# Patient Record
Sex: Male | Born: 1935 | Race: White | Hispanic: No | Smoking: Former smoker
Health system: Southern US, Community
[De-identification: ages and names within clinical notes are randomized; demographics above are authoritative.]

## PROBLEM LIST (undated history)

## (undated) DIAGNOSIS — F32A Depression, unspecified: Secondary | ICD-10-CM

## (undated) DIAGNOSIS — I2510 Atherosclerotic heart disease of native coronary artery without angina pectoris: Secondary | ICD-10-CM

## (undated) DIAGNOSIS — E782 Mixed hyperlipidemia: Secondary | ICD-10-CM

## (undated) DIAGNOSIS — R600 Localized edema: Secondary | ICD-10-CM

## (undated) DIAGNOSIS — E785 Hyperlipidemia, unspecified: Secondary | ICD-10-CM

## (undated) DIAGNOSIS — I340 Nonrheumatic mitral (valve) insufficiency: Secondary | ICD-10-CM

## (undated) DIAGNOSIS — K635 Polyp of colon: Secondary | ICD-10-CM

## (undated) DIAGNOSIS — J189 Pneumonia, unspecified organism: Secondary | ICD-10-CM

## (undated) DIAGNOSIS — C61 Malignant neoplasm of prostate: Secondary | ICD-10-CM

## (undated) DIAGNOSIS — R609 Edema, unspecified: Secondary | ICD-10-CM

## (undated) DIAGNOSIS — I739 Peripheral vascular disease, unspecified: Secondary | ICD-10-CM

## (undated) DIAGNOSIS — R0602 Shortness of breath: Secondary | ICD-10-CM

## (undated) DIAGNOSIS — H409 Unspecified glaucoma: Secondary | ICD-10-CM

## (undated) DIAGNOSIS — F329 Major depressive disorder, single episode, unspecified: Secondary | ICD-10-CM

## (undated) DIAGNOSIS — E041 Nontoxic single thyroid nodule: Secondary | ICD-10-CM

## (undated) DIAGNOSIS — K589 Irritable bowel syndrome without diarrhea: Secondary | ICD-10-CM

## (undated) DIAGNOSIS — I779 Disorder of arteries and arterioles, unspecified: Secondary | ICD-10-CM

## (undated) DIAGNOSIS — R42 Dizziness and giddiness: Secondary | ICD-10-CM

## (undated) HISTORY — DX: Localized edema: R60.0

## (undated) HISTORY — DX: Polyp of colon: K63.5

## (undated) HISTORY — DX: Nonrheumatic mitral (valve) insufficiency: I34.0

## (undated) HISTORY — DX: Malignant neoplasm of prostate: C61

## (undated) HISTORY — PX: TRANSURETHRAL RESECTION OF PROSTATE: SHX73

## (undated) HISTORY — DX: Shortness of breath: R06.02

## (undated) HISTORY — DX: Unspecified glaucoma: H40.9

## (undated) HISTORY — DX: Major depressive disorder, single episode, unspecified: F32.9

## (undated) HISTORY — DX: Edema, unspecified: R60.9

## (undated) HISTORY — DX: Mixed hyperlipidemia: E78.2

## (undated) HISTORY — DX: Dizziness and giddiness: R42

## (undated) HISTORY — DX: Peripheral vascular disease, unspecified: I73.9

## (undated) HISTORY — DX: Nontoxic single thyroid nodule: E04.1

## (undated) HISTORY — DX: Atherosclerotic heart disease of native coronary artery without angina pectoris: I25.10

## (undated) HISTORY — DX: Pneumonia, unspecified organism: J18.9

## (undated) HISTORY — DX: Disorder of arteries and arterioles, unspecified: I77.9

## (undated) HISTORY — PX: TONSILLECTOMY: SUR1361

## (undated) HISTORY — DX: Irritable bowel syndrome, unspecified: K58.9

## (undated) HISTORY — PX: CHOLECYSTECTOMY: SHX55

## (undated) HISTORY — DX: Depression, unspecified: F32.A

## (undated) HISTORY — PX: INCONTINENCE SURGERY: SHX676

## (undated) HISTORY — DX: Hyperlipidemia, unspecified: E78.5

---

## 1998-09-03 ENCOUNTER — Ambulatory Visit (HOSPITAL_COMMUNITY): Admission: RE | Admit: 1998-09-03 | Discharge: 1998-09-03 | Payer: Self-pay | Admitting: Radiation Oncology

## 1998-09-03 ENCOUNTER — Encounter: Admission: RE | Admit: 1998-09-03 | Discharge: 1998-12-02 | Payer: Self-pay | Admitting: Radiation Oncology

## 2005-12-29 ENCOUNTER — Encounter: Admission: RE | Admit: 2005-12-29 | Discharge: 2005-12-29 | Payer: Self-pay | Admitting: Neurosurgery

## 2006-01-12 ENCOUNTER — Encounter: Admission: RE | Admit: 2006-01-12 | Discharge: 2006-01-12 | Payer: Self-pay | Admitting: Neurosurgery

## 2006-06-01 ENCOUNTER — Encounter: Payer: Self-pay | Admitting: Physician Assistant

## 2006-06-01 ENCOUNTER — Ambulatory Visit: Payer: Self-pay | Admitting: Cardiology

## 2006-11-03 HISTORY — PX: CARDIAC CATHETERIZATION: SHX172

## 2007-09-27 ENCOUNTER — Encounter: Payer: Self-pay | Admitting: Physician Assistant

## 2007-09-27 ENCOUNTER — Ambulatory Visit: Payer: Self-pay | Admitting: Cardiology

## 2007-09-29 ENCOUNTER — Inpatient Hospital Stay (HOSPITAL_BASED_OUTPATIENT_CLINIC_OR_DEPARTMENT_OTHER): Admission: RE | Admit: 2007-09-29 | Discharge: 2007-09-29 | Payer: Self-pay | Admitting: Internal Medicine

## 2007-09-29 ENCOUNTER — Ambulatory Visit: Payer: Self-pay | Admitting: Internal Medicine

## 2007-10-25 ENCOUNTER — Encounter: Payer: Self-pay | Admitting: Physician Assistant

## 2007-10-25 ENCOUNTER — Ambulatory Visit: Payer: Self-pay | Admitting: Cardiology

## 2007-11-03 ENCOUNTER — Ambulatory Visit: Payer: Self-pay | Admitting: Cardiology

## 2007-11-03 ENCOUNTER — Encounter: Payer: Self-pay | Admitting: Physician Assistant

## 2007-12-09 ENCOUNTER — Ambulatory Visit: Payer: Self-pay | Admitting: Cardiology

## 2007-12-27 ENCOUNTER — Encounter: Payer: Self-pay | Admitting: Physician Assistant

## 2010-02-05 ENCOUNTER — Encounter (INDEPENDENT_AMBULATORY_CARE_PROVIDER_SITE_OTHER): Payer: Self-pay | Admitting: *Deleted

## 2010-02-07 ENCOUNTER — Ambulatory Visit (HOSPITAL_COMMUNITY): Admission: RE | Admit: 2010-02-07 | Discharge: 2010-02-08 | Payer: Self-pay | Admitting: Urology

## 2010-07-03 ENCOUNTER — Ambulatory Visit: Payer: Self-pay | Admitting: Physician Assistant

## 2010-07-03 DIAGNOSIS — R609 Edema, unspecified: Secondary | ICD-10-CM

## 2010-07-03 DIAGNOSIS — R072 Precordial pain: Secondary | ICD-10-CM | POA: Insufficient documentation

## 2010-07-03 DIAGNOSIS — I10 Essential (primary) hypertension: Secondary | ICD-10-CM

## 2010-07-03 DIAGNOSIS — R0602 Shortness of breath: Secondary | ICD-10-CM | POA: Insufficient documentation

## 2010-07-03 DIAGNOSIS — E785 Hyperlipidemia, unspecified: Secondary | ICD-10-CM

## 2010-07-04 ENCOUNTER — Encounter: Payer: Self-pay | Admitting: Physician Assistant

## 2010-07-05 ENCOUNTER — Ambulatory Visit: Payer: Self-pay | Admitting: Cardiology

## 2010-07-05 ENCOUNTER — Encounter: Payer: Self-pay | Admitting: Physician Assistant

## 2010-07-11 ENCOUNTER — Telehealth (INDEPENDENT_AMBULATORY_CARE_PROVIDER_SITE_OTHER): Payer: Self-pay | Admitting: *Deleted

## 2010-07-12 ENCOUNTER — Encounter (INDEPENDENT_AMBULATORY_CARE_PROVIDER_SITE_OTHER): Payer: Self-pay | Admitting: *Deleted

## 2010-07-31 ENCOUNTER — Encounter (INDEPENDENT_AMBULATORY_CARE_PROVIDER_SITE_OTHER): Payer: Self-pay | Admitting: *Deleted

## 2010-07-31 ENCOUNTER — Ambulatory Visit: Payer: Self-pay | Admitting: Physician Assistant

## 2010-08-01 ENCOUNTER — Encounter: Payer: Self-pay | Admitting: Physician Assistant

## 2010-08-07 ENCOUNTER — Encounter (INDEPENDENT_AMBULATORY_CARE_PROVIDER_SITE_OTHER): Payer: Self-pay | Admitting: *Deleted

## 2010-08-08 ENCOUNTER — Ambulatory Visit: Payer: Self-pay | Admitting: Cardiovascular Disease

## 2010-08-08 ENCOUNTER — Inpatient Hospital Stay (HOSPITAL_BASED_OUTPATIENT_CLINIC_OR_DEPARTMENT_OTHER): Admission: RE | Admit: 2010-08-08 | Discharge: 2010-08-08 | Payer: Self-pay | Admitting: Cardiovascular Disease

## 2010-08-22 ENCOUNTER — Ambulatory Visit: Payer: Self-pay | Admitting: Physician Assistant

## 2010-10-14 ENCOUNTER — Encounter: Payer: Self-pay | Admitting: Physician Assistant

## 2010-12-03 NOTE — Progress Notes (Signed)
Summary: PHONE: TEST RESULTS  Phone Note Call from Patient Call back at Home Phone [phone]   Reason for Call: Lab or Test Results Summary of Call: wants test results of his echo.  Initial call taken by: Zachary George,  July 11, 2010 11:23 AM  Follow-up for Phone Call        Attempted to reach pt. No answer, no machine.  Cyril Loosen, RN, BSN  July 11, 2010 4:59 PM  Pt notified of results and verbalized understanding.  Follow-up by: Cyril Loosen, RN, BSN,  July 12, 2010 11:37 AM

## 2010-12-03 NOTE — Assessment & Plan Note (Signed)
Summary: eph-post cath pr marc degent needs to   Visit Type:  Follow-up Primary Provider:  Dr. Erasmo Downer   History of Present Illness: patient presents for postcatheterization followup.  We recently referred him for diagnostic coronary angiography to rule out CAD progression, since his previous study in 2008, revealing moderate disease. Patient recently presented with persistent DOE and occasional chest discomfort. A 2-D echo indicated preserved LVEF, but with multiple wall motion abnormalities, and mild MR.  Coronary angiography indicated moderate, nonobstructive CAD, with no significant change since his previous study in 2008. Recommendation was to consider a stress test, if patient has recurrent pain, to assess possible ischemia in the distribution of 2 moderate sized obtuse marginal branches, with 50-60% stenosis at the bifurcation. LV function was normal (50-55%).  Clinically, patient denies any chest pain. With respect to his dyspnea, this now appears to be essentially only when he stands up, after bending over. He is now denying any significant dyspnea with walking. Of note, he also has chronic peripheral edema, which is unchanged.  I also recently started him on low-dose aspirin, to which he has reportedly been previously allergic. He informs me today that he stopped this after about a month, secondary to visual scotomata, as well as difficulty sleeping. His symptoms resolved after he stopped aspirin.  Preventive Screening-Counseling & Management  Alcohol-Tobacco     Smoking Status: quit     Year Quit: 1985  Current Medications (verified): 1)  Pravastatin Sodium 40 Mg Tabs (Pravastatin Sodium) .... Take 1 Tablet By Mouth Once A Day 2)  Citalopram Hydrobromide 20 Mg Tabs (Citalopram Hydrobromide) .... Take 1 Tablet By Mouth Once A Day 3)  Lorazepam 1 Mg Tabs (Lorazepam) .... Take 1 Tablet By Mouth Two Times A Day 4)  Vita-Plus E 400 Unit Caps (Vitamin E) .... Take 1 Tablet By  Mouth Once A Day 5)  Nitrostat 0.4 Mg Subl (Nitroglycerin) .Marland Kitchen.. 1 Tablet Under Tongue At Onset of Chest Pain; You May Repeat Every 5 Minutes For Up To 3 Doses. 6)  Fish Oil 1000 Mg Caps (Omega-3 Fatty Acids) .... Take 1 Capsule By Mouth Two Times A Day 7)  Clotrimazole 1 % Crea (Clotrimazole) .... Use As Directed  Allergies (verified): 1)  ! Asa  Comments:  Nurse/Medical Assistant: The patient's medication list and allergies were reviewed with the patient and were updated in the Medication and Allergy Lists.  Past History:  Past Medical History:  Hyperlipidemia.    Depression.  Prostate cancer status post radiation treatment.   Status post TURP.   Shortness of Breath Dizzy CAD... moderate by catheterization, 11/08...essentially unchanged by repeat study, 10/11 Peripheral edema Mitral regurgitation, mild Mixed dyslipidemia  Review of Systems       No fevers, chills, hemoptysis, dysphagia, melena, hematocheezia, hematuria, rash, claudication, orthopnea, pnd, pedal edema. All other systems negative.   Vital Signs:  Patient profile:   77 year old male Height:      66 inches Weight:      178 pounds Pulse rate:   65 / minute BP sitting:   114 / 73  (left arm) Cuff size:   regular  Vitals Entered By: Carlye Grippe (August 22, 2010 1:50 PM)  Physical Exam  Additional Exam:  GEN: 77 year old male, sitting upright, in no distress HEENT: NCAT,PERRLA,EOMI NECK: palpable pulses, no bruits; no JVD; no TM LUNGS: CTA bilaterally HEART: RRR (S1S2); no significant murmurs; no rubs; no gallops ABD: soft, NT; intact BS EXT: intact distal pulses; 2-3+ bilateral,  lower extremity edema SKIN: warm, dry MUSC: no obvious deformity NEURO: A/O (x3)     Impression & Recommendations:  Problem # 1:  SHORTNESS OF BREATH (ICD-786.05)  recent coronary angiography indicates no ischemic explanation for his shortness of breath. He has unchanged anatomy, since previous study of 2008. LV  function is preserved, both by current catheterization and recent 2-D echo. He does have mild MR. He may have some component of diastolic dysfunction, and we'll need to continue monitoring this closely. He does have chronic, bilateral peripheral edema. With respect to antiplatelet therapy, patient has agreed to try Plavix 75 mg daily, given that he is not able to tolerate aspirin. Will closely monitor for any adverse effects  Problem # 2:  DYSLIPIDEMIA (ICD-272.4)  recently started on omega 3 fish oil, in conjunction with pravastatin, for treatment of hypertriglyceridemia. We'll reassess lipid status in 2 months  Problem # 3:  HYPERTENSION, BENIGN (ICD-401.1)  well-controlled on current regimen  Problem # 4:  PERIPHERAL EDEMA (ICD-782.3)  presumed secondary to CVI. Recent 2-D echo indicated normal RV function.  Other Orders: Future Orders: T-Lipid Profile ([id number]) ... 10/14/2010 T-Hepatic Function [id number]) ... 10/14/2010  Patient Instructions: 1)  Your physician wants you to follow-up in: 4 months. You will receive a reminder letter in the mail one-two months in advance. If you don't receive a letter, please call our office to schedule the follow-up appointment. 2)  Your physician recommends that you go to the [facility] for a FASTING lipid profile and liver function labs:  DO IN 2 MONTHS. AROUND October 21, 2010. 3)  Plavix 75mg  by mouth once daily. Prescriptions: PLAVIX 75 MG TABS (CLOPIDOGREL BISULFATE) Take one tablet by mouth daily  #30 x 6   Entered by:   Cyril Loosen, RN, BSN   Authorized by:   Nelida Meuse, PA-C   Signed by:   Cyril Loosen, RN, BSN on 08/22/2010   Method used:   Electronically to        [facility] [address] (retail)       [address]       [place], [place]  [address]       Ph: [phone] or [phone]       Fax: [phone]   RxID:   [id number]   Handout requested.  I have reviewed and  approved all prescriptions at the time of the office visit. Nelida Meuse, PA-C  August 22, 2010 2:48 PM

## 2010-12-03 NOTE — Assessment & Plan Note (Signed)
Summary: 1 MO F/U PER 8/31 OV W/GS-JM   Visit Type:  Follow-up Primary Kelsey Durflinger:  Dr. Erasmo Downer   History of Present Illness: patient returns for a scheduled followup, for ongoing evaluation of DOE and occasional chest discomfort. he presents with history of moderate, nonobstructive CAD by previous catheterization in 2008.  I referred him for chest x-ray, liver profile, and 2-D echo. X-ray was negative for CHF. liver profile notable for total cholesterol 190, triglycerides 660, and HDL 38. I started him on Omega 3 fish oil b.i.d. EGD echo indicated preserved LVF (EF 50-55% Preventive Screening-Counseling & Management  Alcohol-Tobacco     Smoking Status: quit     Year Quit: 1985  Current Medications (verified): 1)  Pravastatin Sodium 40 Mg Tabs (Pravastatin Sodium) .... Take 1 Tablet By Mouth Once A Day 2)  Citalopram Hydrobromide 20 Mg Tabs (Citalopram Hydrobromide) .... Take 1 Tablet By Mouth Once A Day 3)  Lorazepam 1 Mg Tabs (Lorazepam) .... Take 1 Tablet By Mouth Two Times A Day 4)  Vita-Plus E 400 Unit Caps (Vitamin E) .... Take 1 Tablet By Mouth Once A Day 5)  Nitrostat 0.4 Mg Subl (Nitroglycerin) .Marland Kitchen.. 1 Tablet Under Tongue At Onset of Chest Pain; You May Repeat Every 5 Minutes For Up To 3 Doses. 6)  Fish Oil 1000 Mg Caps (Omega-3 Fatty Acids) .... Take 1 Capsule By Mouth Two Times A Day  Allergies (verified): 1)  ! Asa  Comments:  Nurse/Medical Assistant: The patient is currently on medications but does not know the name or dosage at this time. Instructed to contact our office with details. Will update medication list at that time.  Past History:  Past Medical History:  Hyperlipidemia.    Depression.  Prostate cancer status post radiation treatment.   Status post TURP.   Shortness of Breath Dizzy CAD... moderate by catheterization, 11/08 Peripheral edema Mitral regurgitation Mixed dyslipidemia  Review of Systems       No fevers, chills, hemoptysis,  dysphagia, melena, hematocheezia, hematuria, rash, claudication, orthopnea, or pnd, pedal. All other systems negative.   Vital Signs:  Patient profile:   77 year old male Height:      66 inches Weight:      179 pounds Pulse rate:   73 / minute BP sitting:   116 / 78  (left arm) Cuff size:   regular  Vitals Entered By: Carlye Grippe (July 31, 2010 2:00 PM)  Physical Exam  Additional Exam:  GEN: 77 year old male, sitting upright, in no distress HEENT: NCAT,PERRLA,EOMI NECK: palpable pulses, no bruits; no JVD; no TM LUNGS: CTA bilaterally HEART: RRR (S1S2); no significant murmurs; no rubs; no gallops ABD: soft, NT; intact BS EXT: intact distal pulses; 2-3+ bilateral, lower extremity edema SKIN: warm, dry MUSC: no obvious deformity NEURO: A/O (x3)     Impression & Recommendations:  Problem # 1:  SHORTNESS OF BREATH (ICD-786.05)  I am concerned that this represents an anginal equivalent. Recent 2-D echo indicated normal LVF, but with multiple wall motion abnormalities. In discussion with the patient and his son, we've agreed to proceed with a relook catheterization to rule out significant CAD progression, rather than proceeding with a stress test. The patient is in agreement with this recommendation, and the risk/benefits were discussed, in conjunction with Dr. Andee Lineman, who approved of this plan.  Orders: T-Basic Metabolic Panel [id number]) T-CBC No Diff ([id number]) T-Protime, Auto ([id number]) T-PTT ([id number]) Cardiac Catheterization (Cardiac Cath)  His updated medication list for this problem includes:  Aspirin 81 Mg Tbec (Aspirin) .Marland Kitchen... Take one tablet by mouth daily  Problem # 2:  DYSLIPIDEMIA (ICD-272.4)  recent profile indicates mixed dyslipidemia, with markedly elevated triglycerides. Patient has begun omega-3 fish oil treatment, in conjunction with the statin that he was already taking, spaceand we'll reassess this in the next 2 months.  His updated  medication list for this problem includes:    Pravastatin Sodium 40 Mg Tabs (Pravastatin sodium) .Marland Kitchen... Take 1 tablet by mouth once a day  Problem # 3:  HYPERTENSION, BENIGN (ICD-401.1) Assessment: Comment Only  His updated medication list for this problem includes:    Aspirin 81 Mg Tbec (Aspirin) .Marland Kitchen... Take one tablet by mouth daily  Problem # 4:  PERIPHERAL EDEMA (ICD-782.3)  most likely related to chronic venous insufficiency. Recent echocardiogram indicated normal RV function.  Patient Instructions: 1)  Your physician recommends that you go to the [facility] for lab work: DO ON 08/01/10. 2)  Your physician discussed the risks, benefits and indications for preventive aspirin therapy. It is recommended that you start (or continue) taking 81 mg of aspirin a day. 3)  Your physician has requested that you have a cardiac catheterization.  Cardiac catheterization is used to diagnose and/or treat various heart conditions. Doctors may recommend this procedure for a number of different reasons. The most common reason is to evaluate chest pain. Chest pain can be a symptom of coronary artery disease (CAD), and cardiac catheterization can show whether plaque is narrowing or blocking your heart's arteries. This procedure is also used to evaluate the valves, as well as measure the blood flow and oxygen levels in different parts of your heart.  For further information please visit [email or web address]  Please follow instruction sheet, as given.

## 2010-12-03 NOTE — Letter (Signed)
Summary: Engineer, materials at [facility]  [address]   [place], [place] [address]   Phone: [phone]  Fax: [phone]        July 12, 2010 MRN: [id number]   Warren [name] [address] [place], [place]  [address]   Dear Mr. [name],  Your test ordered by Selena Batten has been reviewed by your physician (or physician assistant) and was found to be normal or stable. Your physician (or physician assistant) felt no changes were needed at this time.  ____ Echocardiogram  ____ Cardiac Stress Test  ____ Lab Work  ____ Peripheral vascular study of arms, legs or neck  __X__ CT scan or X-ray - chest x-ray   ____ Lung or Breathing test  ____ Other:   Thank you.   Hoover Brunette, LPN    Duane Boston, M.D., F.A.C.C. Thressa Sheller, M.D., F.A.C.C. Oneal Grout, M.D., F.A.C.C. Cheree Ditto, M.D., F.A.C.C. Daiva Nakayama, M.D., F.A.C.C. Kenney Houseman, M.D., F.A.C.C. Jeanne Ivan, PA-C

## 2010-12-03 NOTE — Cardiovascular Report (Signed)
Summary: Cardiac Cath  Cardiac Cath   Imported By: Zachary George 08/22/2010 11:48:04  _____________________________________________________________________  External Attachment:    Type:   Image     Comment:   External Document

## 2010-12-03 NOTE — Cardiovascular Report (Signed)
Summary: CARDIAC CATH  [place]  CARDIAC CATH  [place]   Imported By: Zachary George 06/20/2010 14:14:28  _____________________________________________________________________  External Attachment:    Type:   Image     Comment:   External Document

## 2010-12-03 NOTE — Letter (Signed)
Summary: Internal Correspondence/ FAXED PRE-CATH ORDER  Internal Correspondence/ FAXED PRE-CATH ORDER   Imported By: Dorise Hiss 08/08/2010 14:00:04  _____________________________________________________________________  External Attachment:    Type:   Image     Comment:   External Document

## 2010-12-03 NOTE — Letter (Signed)
Summary: Cardiac Cath Instructions - [facility]  [facility] [address]   [place], [place] [address]   Phone: [phone]  Fax: [phone]     07/31/2010 MRN: [id number]  Warren [name] [address] [place], [place]  [address]  Dear [name], ROSIER are scheduled for a Cardiac Catheterization on Thursday, August 08, 2010 with Dr. [place].  Please arrive to the 1st floor of the Heart and Vascular Center at [facility] at 0930 am on the day of your procedure. Please do not arrive before 6:30 a.m. Call the Heart and Vascular Center at [phone] if you are unable to make your appointmnet. The Code to get into the parking garage under the building is 0200. Take the elevators to the 1st floor. You must have someone to drive you home. Someone must be with you for the first 24 hours after you arrive home. Please wear clothes that are easy to get on and off and wear slip-on shoes. Do not eat or drink after midnight except water with your medications that morning. Bring all your medications and current insurance cards with you.  ___ DO NOT take these medications before your procedure:  _X__ Make sure you take your aspirin.  _X__ You may take ALL of your medications with water that morning.  ___ DO NOT take ANY medications before your procedure.  ___ Pre-med instructions:  ________________________________________________________________________  The usual length of stay after your procedure is 2 to 3 hours. This can vary.  If you have any questions, please call the office at the number listed above.  Cyril Loosen, RN, BSN              Directions to the [facility]  Please Note : Park in [place] under the building not the parking deck.  From [employer]: Turn onto [address] Left onto [place] (1st stoplight) Right at the brick entrance to the hospital ([address]) Bear to the  right and you will see a blue sign "[facility]" Parking garage is a sharp right'to get through the gate out in the code __0200_____. Once you park, take the elevator to the first floor. Please do not arrive before 0630am. The building will be dark before that time.   From [address] Turn onto [address] Turn left into the brick entrance to the hospital (Main circle drive) Bear to the right and you will see a blue sign "[facility]" Parking garage is a sharp right, to get thru the gate put in the code _0200___. Once you park, take the elevator to the first floor. Please do not arrive before 0630am. The building will be dark before that time

## 2010-12-03 NOTE — Letter (Signed)
Summary: Engineer, materials at [facility]  [address]   [place], [place] [address]   Phone: [phone]  Fax: [phone]        August 07, 2010 MRN: [id number]   Warren [name] [address] [place], [place]  [address]   Dear Warren [name],  Your test ordered by Selena Batten has been reviewed by your physician (or physician assistant) and was found to be normal or stable. Your physician (or physician assistant) felt no changes were needed at this time.  ____ Echocardiogram  ____ Cardiac Stress Test  __X__ Lab Work - precath labs  ____ Peripheral vascular study of arms, legs or neck  ____ CT scan or X-ray  ____ Lung or Breathing test  ____ Other:   Thank you.   Hoover Brunette, LPN    Duane Boston, M.D., F.A.C.C. Thressa Sheller, M.D., F.A.C.C. Oneal Grout, M.D., F.A.C.C. Cheree Ditto, M.D., F.A.C.C. Daiva Nakayama, M.D., F.A.C.C. Kenney Houseman, M.D., F.A.C.C. Jeanne Ivan, PA-C

## 2010-12-03 NOTE — Assessment & Plan Note (Signed)
Summary: 2 yr fu-seen by Dr. Andee Lineman 2008   Visit Type:  Follow-up Primary Provider:  Dr. Erasmo Downer   History of Present Illness: patient presents for evaluation of exertional dyspnea. he was last seen here in the clinic in February 2009, for management of moderate, nonobstructive CAD by catheterization, 11/08, normal left ventricular function, and dyslipidemia. He has not had any subsequent ischemic evaluation.  He presents with his son, who suggests that the patient has worsening DOE and lower extremity edema. However, there is no indication of orthopnea or PND. The patient, himself, also suggests some occasional chest discomfort, but not always, and not while in the midst of moderate exertion.  EKG in our office today indicates NSR with no ischemic changes.  Preventive Screening-Counseling & Management  Alcohol-Tobacco     Smoking Status: quit  Comments: quit smoking about 25 yrs ago after smoking for about 25 yrs  Current Medications (verified): 1)  Pravastatin Sodium 40 Mg Tabs (Pravastatin Sodium) .... Take 1 Tablet By Mouth Once A Day 2)  Citalopram Hydrobromide 20 Mg Tabs (Citalopram Hydrobromide) .... Take 1 Tablet By Mouth Once A Day 3)  Lorazepam 1 Mg Tabs (Lorazepam) .... Take 1 Tablet By Mouth Two Times A Day 4)  Vita-Plus E 400 Unit Caps (Vitamin E) .... Take 1 Tablet By Mouth Once A Day  Allergies: 1)  ! Asa  Comments:  Nurse/Medical Assistant: The patient's medications were reviewed with the patient and were updated in the Medication List. Pt brought a list of medications to office visit.  Cyril Loosen, RN, BSN (July 03, 2010 1:35 PM)  Past History:  Past Medical History: Last updated: 06/20/2010  Hyperlipidemia.    Depression.  Prostate cancer status post radiation treatment.   Status post TURP.   Shortness of Breath Dizzy chest pains   Social History: Smoking Status:  quit  Review of Systems       No fevers, chills, hemoptysis,  dysphagia, melena, hematocheezia, hematuria, rash, claudication, orthopnea, or pnd. All other systems negative.   Vital Signs:  Patient profile:   77 year old male Height:      66 inches Weight:      179.50 pounds BMI:     29.08 Pulse rate:   72 / minute BP sitting:   132 / 85  (left arm) Cuff size:   regular  Vitals Entered By: Cyril Loosen, RN, BSN (July 03, 2010 1:32 PM) Comments Last office visit 2008-Pt c/o SOB and LE edema. Pt does not take Aspirin. He states when he takes Aspirin he sees spots.   Physical Exam  Additional Exam:  GEN: 77 year old male, sitting upright, in no distress HEENT: NCAT,PERRLA,EOMI NECK: palpable pulses, no bruits; no JVD; no TM LUNGS: CTA bilaterally HEART: RRR (S1S2); no significant murmurs; no rubs; no gallops ABD: soft, NT; intact BS EXT: intact distal pulses;3+ bilateral, lower extremity edema SKIN: warm, dry MUSC: no obvious deformity NEURO: A/O (x3)     EKG  Procedure date:  07/03/2010  Findings:      normal sinus rhythm at 67 bpm; incomplete RBBB; normal axis and no ischemic changes  Impression & Recommendations:  Problem # 1:  SHORTNESS OF BREATH (ICD-786.05)  Will evaluate further with 2-D echocardiography, chest x-ray, and BNP level. Return visit with myself and Dr. Andee Lineman in 3-4 weeks, per review of test results and further recommendations. If echocardiogram indicates LV dysfunction, then we'll strongly consider repeat cardiac catheterization to rule out significant CAD progression. Patient had moderate, nonobstructive  CAD ARC our study, 11/08, and normal LV function and right-sided heart pressures.  Orders: T-Comprehensive Metabolic Panel [id number]) T-CBC No Diff ([id number]) T-TSH [id number] T-Lipid Profile ([id number]) T-BNP  (B Natriuretic Peptide) ([id number]) EKG w/ Interpretation (93000) T-Chest x-ray, 2 views ([id number]) 2-D Echocardiogram (2D Echo)  The following medications were removed from the  medication list:    Aspir-low 81 Mg Tbec (Aspirin) .Marland Kitchen... Take 1 tablet by mouth once a day  Problem # 2:  PRECORDIAL PAIN (ICD-786.51)  as outlined above, with recommendation to maintain low threshold for repeat cardiac catheterization, it echocardiogram is abnormal.  Problem # 3:  HYPERTENSION, BENIGN (ICD-401.1) Assessment: Comment Only  Problem # 4:  DYSLIPIDEMIA (ICD-272.4)  we'll reassess lipid status with fasting lipid profile.  Problem # 5:  PERIPHERAL EDEMA (ICD-782.3)  further evaluation with 2-D echo, to assess RV function.  Patient Instructions: 1)  Your physician recommended you take 1 tablet (or 1 spray) under tongue at onset of chest pain; you may repeat every 5 minutes for up to 3 doses. If 3 or more doses are required, call 911 and proceed to the ER immediately. 2)  A chest x-ray takes a picture of the organs and structures inside the chest, including the heart, lungs, and blood vessels. This test can show several things, including, whether the heart is enlarged; whether fluid is building up in the lungs; and whether pacemaker / defibrillator leads are still in place. 3)  Your physician has requested that you have an echocardiogram.  Echocardiography is a painless test that uses sound waves to create images of your heart. It provides your doctor with information about the size and shape of your heart and how well your heart's chambers and valves are working.  This procedure takes approximately one hour. There are no restrictions for this procedure. 4)  Your physician recommends that you go to the [facility] for lab work: Do not eat or drink after midnight.  5)  Follow up with Gene Alaylah Heatherington, PA-C/Dr. Earnestine Leys on Wednesday, Sept 28, 2011 at 1:40pm. Prescriptions: NITROSTAT 0.4 MG SUBL (NITROGLYCERIN) 1 tablet under tongue at onset of chest pain; you may repeat every 5 minutes for up to 3 doses.  #25 x 3   Entered by:   Cyril Loosen, RN, BSN   Authorized by:   Nelida Meuse, PA-C   Signed by:   Cyril Loosen, RN, BSN on 07/03/2010   Method used:   Electronically to        [facility] [address] (retail)       [address]       [place], [place]  [address]       Ph: [phone] or [phone]       Fax: [phone]   RxID:   [id number]   Handout requested.

## 2010-12-20 ENCOUNTER — Encounter: Payer: Self-pay | Admitting: Cardiology

## 2010-12-20 ENCOUNTER — Ambulatory Visit (INDEPENDENT_AMBULATORY_CARE_PROVIDER_SITE_OTHER): Payer: Medicare Other | Admitting: Cardiology

## 2010-12-20 DIAGNOSIS — R42 Dizziness and giddiness: Secondary | ICD-10-CM

## 2011-01-14 NOTE — Assessment & Plan Note (Signed)
Summary: 4 MO FU PE FEB REMINDER-SRS   Visit Type:  Follow-up Primary Provider:  Hasanaj   History of Present Illness: the patient is an 77 year old male with a history of normal LV function, moderate nonobstructive coronary artery disease. He reports no chest pain. He sometimes has some shortness of breath when laying down. His symptoms get better as the day goes on. He does have some lower extremity edema secondary to varicose veins.the patient reports some dizziness.he sometimes feels off balance. Otherwise has been doing stable from a cardiovascular perspective.  Preventive Screening-Counseling & Management  Alcohol-Tobacco     Smoking Status: quit     Year Quit: 1985  Current Medications (verified): 1)  Citalopram Hydrobromide 20 Mg Tabs (Citalopram Hydrobromide) .... Take 1 Tablet By Mouth Once A Day 2)  Lorazepam 1 Mg Tabs (Lorazepam) .... Take 1 Tablet By Mouth Two Times A Day 3)  Vita-Plus E 400 Unit Caps (Vitamin E) .... Take 1 Tablet By Mouth Once A Day 4)  Nitrostat 0.4 Mg Subl (Nitroglycerin) .Marland Kitchen.. 1 Tablet Under Tongue At Onset of Chest Pain; You May Repeat Every 5 Minutes For Up To 3 Doses. 5)  Fish Oil 1000 Mg Caps (Omega-3 Fatty Acids) .... Take 1 Capsule By Mouth Two Times A Day 6)  Clotrimazole 1 % Crea (Clotrimazole) .... Use As Directed 7)  Plavix 75 Mg Tabs (Clopidogrel Bisulfate) .... Take One Tablet By Mouth Daily 8)  Lipitor 40 Mg Tabs (Atorvastatin Calcium) .... Take One Tablet By Mouth At Bedtime.  Allergies (verified): 1)  ! Asa  Comments:  Nurse/Medical Assistant: The patient is currently on medications but does not know the name or dosage at this time. Instructed to contact our office with details. Will update medication list at that time.  Past History:  Past Medical History: Last updated: 08/22/2010  Hyperlipidemia.    Depression.  Prostate cancer status post radiation treatment.   Status post TURP.   Shortness of Breath Dizzy CAD... moderate  by catheterization, 11/08...essentially unchanged by repeat study, 10/11 Peripheral edema Mitral regurgitation, mild Mixed dyslipidemia  Past Surgical History: Last updated: 06/20/2010 Cardiac Catheterization (2008) radiation and TURP.  He has stress urinary   incontinence.   Family History: Last updated: 06/20/2010    Insignificant for premature CAD.  He thinks his sister   may have had myocardial infarction in her 79s.  His mother lived to 100.      Social History: Last updated: 06/20/2010  The patient admits to smoking cigarettes, one pack per   day for about 40 years for a 40 pack year history.  Denies alcohol   abuse.  He is widowed.  He has four children.  He is retired from   Designer, fashion/clothing.  He used to work at [employer].  He is also a World War II   Cytogeneticist.  He was in the [employer] [place]   II.   Risk Factors: Smoking Status: quit (12/20/2010)  Review of Systems       The patient complains of dizziness.  The patient denies fatigue, malaise, fever, weight gain/loss, vision loss, decreased hearing, hoarseness, chest pain, palpitations, shortness of breath, prolonged cough, wheezing, sleep apnea, coughing up blood, abdominal pain, blood in stool, nausea, vomiting, diarrhea, heartburn, incontinence, blood in urine, muscle weakness, joint pain, leg swelling, rash, skin lesions, headache, fainting, depression, anxiety, enlarged lymph nodes, easy bruising or bleeding, and environmental allergies.    Vital Signs:  Patient profile:   77  year old male Height:      66 inches Weight:      177 pounds Pulse rate:   81 / minute Pulse (ortho):   79 / minute BP sitting:   129 / 81  (left arm) BP standing:   126 / 83 Cuff size:   regular  Vitals Entered By: Carlye Grippe (December 20, 2010 11:22 AM)  Serial Vital Signs/Assessments:  Time      Position  BP       Pulse  Resp  Temp     By 11:56 AM  Lying RA  121/86   80                    [facility],  LPN 81:19 AM  Sitting   131/79   84                    [address] LPN 14:78 AM  Standing  126/83   79                    [address], LPN 29:56 PM  Standing  124/82   77                    [place], LPN 21:30 PM  Standing  137/83   80                    [place], LPN   Physical Exam  Additional Exam:  GEN: 77 year old male, sitting upright, in no distress HEENT: NCAT,PERRLA,EOMI NECK: palpable pulses, no bruits; no JVD; no TM LUNGS: CTA bilaterally HEART: RRR (S1S2); no significant murmurs; no rubs; no gallops ABD: soft, NT; intact BS EXT: intact distal pulses; 2-3+ bilateral, lower extremity edema SKIN: warm, dry MUSC: no obvious deformity NEURO: A/O (x3)     Impression & Recommendations:  Problem # 1:  PERIPHERAL EDEMA (ICD-782.3) secondary to varicosities. Her compression stockings. Normal RV function by echo  Problem # 2:  HYPERTENSION, BENIGN (ICD-401.1) stable. Orthostatics also stable.  Problem # 3:  SHORTNESS OF BREATH (ICD-786.05) likely secondary to deconditioning.. Patient has normal LV function. He only has mild mitral regurgitation he does have a small component of mild mitral regurgitation.  Patient Instructions: 1)  Your physician recommends that you continue on your current medications as directed. Please refer to the Current Medication list given to you today. 2)  Follow up in  6 months

## 2011-01-22 LAB — CBC
HCT: 46.9 % (ref 39.0–52.0)
Hemoglobin: 15.9 g/dL (ref 13.0–17.0)
MCHC: 33.9 g/dL (ref 30.0–36.0)
RBC: 4.92 MIL/uL (ref 4.22–5.81)

## 2011-01-22 LAB — ABO/RH: ABO/RH(D): A POS

## 2011-01-22 LAB — APTT: aPTT: 33 s (ref 24–37)

## 2011-01-22 LAB — CREATININE, SERUM
Creatinine, Ser: 0.93 mg/dL (ref 0.4–1.5)
GFR calc non Af Amer: >60 mL/min

## 2011-03-17 ENCOUNTER — Other Ambulatory Visit: Payer: Self-pay | Admitting: Physician Assistant

## 2011-03-18 NOTE — Cardiovascular Report (Signed)
NAME:  LAUTARO, KORAL NO.:  [id number]   MEDICAL RECORD NO.:  [id number]          PATIENT TYPE:  OIB   LOCATION:  1965                         FACILITY:  [facility]   PHYSICIAN:  Bevelyn Buckles. Bensimhon, MDDATE OF BIRTH:  [birth year]   DATE OF PROCEDURE:  DATE OF DISCHARGE:                            CARDIAC CATHETERIZATION   PRIMARY CARE PHYSICIAN:  Erasmo Downer, MD   CARDIOLOGIST:  Dr. Lewayne Bunting.   INDICATIONS:  Mr. Rod is a very pleasant 77 year old male with no  history of known coronary artery disease.  He does have a history of  hyperlipidemia.  He is been experiencing and previous tobacco use.  He  had been experiencing some moderate exertional dyspnea and chest pain.  He is referred for diagnostic catheterization.   PROCEDURES PERFORMED:  1. Right heart catheterization.  2. Left heart catheterization.  3. Left ventriculogram.  4. Selective coronary angiography.  5. Abdominal aortogram.  6. Right atrial angiography.   DESCRIPTION OF PROCEDURE:  Risks and indication of catheterization were  explained.  Consent was signed and placed on the chart.  A 4-French  arterial sheath was placed in the right femoral artery using a modified  Seldinger technique.  We had some difficulty getting up through the  right iliac, and Wholey wire was used. Subsequent to that, high wire  catheter exchanges were made.  Standard catheters including JL-4, 3-DRC  and angled pigtails were used for procedure.  I put a 7-French venous  sheath and the right femoral vein using a modified Seldinger technique.  There was marked tortuosity of the high IVC that made it quite difficult  to get a Swan-Ganz catheter into the right atrium.  Once the Ernestine Conrad was in  the right atrium, it was then difficult to manipulated across the  tricuspid valve.  We took several images of this to help guide [place].  We  were finally able to perform the right heart catheterization with an S10  Swan catheter.   There were no apparent complications.   HEMODYNAMIC RESULTS:  Right atrial pressure mean of 7, RV 26/4, PA 26/9  with a mean of 16, wedge pressure mean of 11.  Central aortic pressure  125/69 with a mean of 93.  LV 121/6 with EDP of 15.  Fick cardiac output  was 3.7 liters per minute.  Cardiac index was 2.0 liters per minute per  meter squared.   Left main was normal.   LAD was a long vessel coursing to the apex, gave off a moderate-to-large  first diagonal and two small mid diagonals.  The entire vessel was  somewhat small with mild to moderate diffuse disease.  There was a 40-  50% lesion in the midsection.   Left circumflex gave off a large branching OM-1, a small OM-2.  There  was a 50-60% lesion in the mid OM at the bifurcation of the branches.   Right coronary artery was a dominant vessel.  There was overall PDA and  a small posterolateral.  There is a 40-50% ostial lesion, 30% proximal  lesion and a 40% lesion in the midsection.  Left ventriculogram done in  the RAO position showed ejection fraction about 50% with question of  mild apical hypokinesis.   Abdominal aortogram showed patent renal arteries bilaterally.  There was  some mild to moderate plaquing throughout the abdominal aorta and  iliacs.  The right iliac was tortuous and had a 40% stenosis in it.   ASSESSMENT:  1. Moderate nonobstructive coronary artery disease described above.  2. Low normal LV ejection fraction.  3. Normal right-sided pressures.  4. Mild to moderate peripheral arterial disease as described above.   PLAN/DISCUSSION:  I do not think his symptoms are cardiac in nature.  We  will plan medical therapy.  Given the difficulties we had maneuvering  catheters, it may be reasonable to get an echocardiograms just to make  sure he has a structurally normal heart.      Bevelyn Buckles. Bensimhon, MD  Electronically Signed     DRB/MEDQ  D:  09/29/2007  T:  09/29/2007  Job:  [id number]   cc:   Erasmo Downer, MD  [name], MD,FACC

## 2011-03-18 NOTE — Assessment & Plan Note (Signed)
[facility]                          [facility] NOTE   DAGAN, HEINZ                 MRN:          [id number]  DATE:12/09/2007                            DOB:          January 20, 1922    CARDIOLOGIST:  Dr. Lewayne Bunting.   PRIMARY CARE PHYSICIAN:  Dr. Erasmo Downer.   REASON FOR VISIT:  6-week follow-up.   HISTORY OF PRESENT ILLNESS:  Mr. Muzzy is an 77 year old male  patient with nonobstructive coronary disease who presents to [place] today  for follow-up.  At his last visit in December he noted some problems  with dysphagia.  We placed him on proton pump inhibitor and asked him to  see gastroenterology.  He is still waiting for an appointment Dr.  Renae Fickle.  This is apparently scheduled in March.  He saw his optometrist  yesterday.  He was apparently told there was no reason why he could not  take aspirin.  I spoke to Dr. Mayford Knife myself over the telephone and he  felt that Mr. Sunga's eyes looked okay as far as he can tell from  an optometric standpoint.  There was no contraindication to using  aspirin.   Today the patient notes that his dysphagia has improved as well as his  dyspnea with lying flat.  He notes some minimal dyspnea on exertion.  He  has occasional chest pain from time to time.  This is quite atypical and  has been present for many years.  There has been no change.  He denies  syncope.  Denies PND or pedal edema.   CURRENT MEDICATIONS:  Pravastatin 40 mg q.h.s. citalopram 20 mg daily,  lorazepam 1 mg b.i.d., vitamin E omeprazole 20 mg daily.   ALLERGIES:  As noted previously he sees spots with taking ASPIRIN.   PHYSICAL EXAM:  He is a well-nourished male, in no acute distress.  Blood pressure 115/71, pulse 67, weight 165.8 pounds.  HEENT is normal.  NECK:  Without JVD.  CARDIAC:  regular rate and rhythm, no murmur.  LUNGS:  Clear to auscultation bilaterally without wheezing, rhonchi or  rales.  ABDOMEN:   soft, nontender with normoactive bowel sounds, no  organomegaly.  EXTREMITIES:  With 1+ ankle edema bilaterally.  Multiple varicosities  noted bilaterally.  SKIN:  Warm and dry.  NEUROLOGIC:  He is alert and oriented x3.  Cranial nerves 2-12 are  grosslyintact.   IMPRESSION:  1. Dyspnea.  2. Dysphagia.      a.     Probable gastroesophageal reflux disease.  3. Moderate nonobstructive coronary disease by catheterization in      November 2008.  4. Normal right-sided heart pressures at cardiac catheterization      November 2008.  5. Good LV function by echocardiogram December 2008 with an EF of 55-      60%.  6. Treated dyslipidemia.  7. Smoker.  8. History of prostate cancer status post radiation  9. Depression.   DISCUSSION:  Mr. Bullinger presents today for follow-up.  Overall he is  doing well.  His symptoms of dyspnea with lying flat as well as his  dysphagia seemed  to be have improved somewhat on omeprazole.  He still  is awaiting his appointment with Dr. Renae Fickle.  As noted above, I spoke to  Dr. Mayford Knife who examined the patient yesterday and does not feel he has  any problems with his eyes that would keep him from using aspirin.   PLAN:  1. Proceed with GI evaluation in March 2009 as scheduled with Dr.      Dionicia Abler.  2. Continue omeprazole.  3. The patient will try aspirin 81 mg daily for primary prevention.      If he has side effects with this, he is to let us know.  4. The patient will come back in follow-up in the next 6 months or      sooner p.r.n.  After full evaluation by gastroenterology, if he      continues to have symptoms, we may want to consider pulmonary      evaluation as he does have a history of prior tobacco abuse.  5. It should be noted that the patient does have a history of elevated      bilateral hemidiaphragms.  I question whether not this may be      contributing somewhat to his symptoms.  I will review this with Dr.      Andee Lineman as well.       [name], PA-C  Electronically Signed      [name], MD,FACC  Electronically Signed   SW/MedQ  DD: 12/09/2007  DT: 12/10/2007  Job #: [id number]   cc:   Erasmo Downer, MD  Dr. Velna Hatchet

## 2011-03-18 NOTE — Assessment & Plan Note (Signed)
[facility]                          [facility] NOTE   Warren, [name]                 MRN:          [id number]  DATE:09/27/2007                            DOB:          [birth year]    REFERRING PHYSICIAN:  Erasmo Downer, MD   REASON FOR REFERRAL:  Dyspnea.   HISTORY OF PRESENT ILLNESS:  Warren [name] is an 77 year old male  patient with no previously documented coronary artery disease, who  presents now today for further evaluation of dyspnea.  The most  concerning symptoms for the patient is that of dyspnea when he lays down  in bed at night.  He has noticed this for the last few months.  He  denies any true orthopnea.  He does not have to prop up on pillows for  relief.  He continues to lie supine and the breathing gets better.  Over  the last year, however, he has noted dyspnea with exertion.  The most  notable is when he takes his trash out.  He has a very large hill that  he has to go down and come back up.  When he comes back up that hill, he  notes significant dyspnea.  He will also note some chest tightness with  this.  He notes a chest tightness at other times when he does activity  around the house.  This is new over the last year.  It does not seem to  be getting any worse.  He denies any rest symptoms.  There is no  associated radiation, nausea or diaphoresis.  He denies any symptoms of  syncope or near syncope.  He does have occasional palpitations.  He does  have pedal edema that seems to be chronic without change.   PAST MEDICAL HISTORY:  1. Hyperlipidemia.  2. Questionable thyroid nodule - apparently benign per his primary      M.D.  3. Depression.  4. Prostate cancer status post radiation treatment.  5. Status post TURP.   MEDICATIONS:  1. Pravastatin 40 mg daily.  2. Citalopram 20 mg daily.  3. Lorazepam 1 mg b.i.d.  4. Vitamin E.  5. Fish oil.  6. Furosemide p.r.n.   ALLERGIES:  He apparently  develops visual floaters with taking aspirin.   SOCIAL HISTORY:  The patient admits to smoking cigarettes, one pack per  day for about 40 years for a 40 pack year history.  Denies alcohol  abuse.  He is widowed.  He has four children.  He is retired from  Designer, fashion/clothing.  He used to work at [employer].  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  He was in the [employer] [place].   FAMILY HISTORY:  Insignificant for premature CAD.  He thinks his sister  may have had myocardial infarction in her 20s.  His mother lived to 100.   REVIEW OF SYSTEMS:  Please see HPI.  Denies any fevers or chills.  He  does have a nonproductive cough from time to time.  Denies any  hemoptysis.  Denies any melena or hematochezia.  Denies any  hematuria or  dysuria.  Denies any dysphagia or odynophagia.  He does have some  bilateral lower extremity pain from time to time.  He reports a lot of  cramping.  He denies any monocular blindness, unilateral weakness,  difficulty with speech or facial droop.  He does have a rare headache.  Rest of review of systems are negative.   PHYSICAL EXAMINATION:  GENERAL:  He is a well-developed, well-nourished  elderly male.  VITAL SIGNS:  Blood pressure 120/87, pulse 68, weight 167.8 pounds.  HEENT:  Normal.  NECK:  Without JVD, without lymphadenopathy.  ENDOCRINE:  Without thyromegaly.  CARDIAC:  Normal S1, S2.  Regular rate and rhythm without murmurs,  clicks, rubs or gallops.  LUNGS:  Decreased breath sounds bilaterally.  No wheezes, rales or  rhonchi.  ABDOMEN:  Soft, nontender with normoactive bowel sounds.  No  organomegaly.  EXTREMITIES:  With 1+ ankle edema bilaterally.  Calves soft and  nontender.  SKIN:  Warm and dry.  NEUROLOGICAL:  Alert and oriented x3.  Cranial nerves II-XII grossly  intact.  VASCULAR:  Carotids without bruits bilaterally.  Femoral artery pulses  2+ bilaterally without bruits.  Dorsalis pedis and posterior tibialis   pulses are 2+ bilaterally.   STUDIES:  Electrocardiogram reveals sinus rhythm with a heart rate of  64, normal axis, no acute changes.   IMPRESSION:  1. Exertional chest discomfort and shortness of breath.  Rule out      angina.  2. Treated dyslipidemia.  3. Ex-smoker.  4. History of prostate cancer status post radiation therapy.  5. Depression.   PLAN:  The patient presents to the office today for further evaluation  of dyspnea with exertion as well as exertional chest discomfort.  His  symptoms are suggestive of ischemic heart disease.  Risks and benefits  of proceeding with right and left heart catheterization have been  explained to  the patient.  He agrees to proceed.  We will arrange this in the  outpatient lab, and he will follow up in this office after his heart  catheterization.      [name], PA-C  Electronically Signed      [name], MD,FACC  Electronically Signed   SW/MedQ  DD: 09/27/2007  DT: 09/27/2007  Job #: [id number]   cc:   Erasmo Downer, MD

## 2011-03-18 NOTE — Assessment & Plan Note (Signed)
[facility]                          [facility] NOTE   Warren, [name]                 MRN:          [id number]  DATE:10/25/2007                            DOB:          1922-04-19    CARDIOLOGIST:  Dr. Lewayne Bunting.   PRIMARY CARE PHYSICIAN:  Dr. Erasmo Downer.   REASON FOR VISIT:  Post catheterization followup.   HISTORY OF PRESENT ILLNESS:  Warren [name] is an 77 year old male  patient whom I saw initially on September 27, 2007 with complaints of  dyspnea.  He also noticed some chest tightness with exertion as well as  some dyspnea with exertion.  He had had an equivocal stress test done in  July of 2007.  The nuclear images revealed partially reversible defects  in the inferior and apical segments.  Underlying ischemia could not be  excluded.  Given his symptoms and his equivocal stress test, we elected  to refer him for outpatient cardiac catheterization.   The cardiac catheterization was done by Dr. Gala Romney on November 26.  This revealed moderate nonobstructive coronary artery disease.  Specifically, he had a 40% to 50% mid left anterior descending lesion,  50% to 60% mid obtuse marginal lesion, 40% to 50% ostial right coronary  artery, 30% proximal right coronary artery, and 40% mid right coronary  artery lesion.  His ejection fraction was 50%.  He had normal right-  sided heart pressures.  There was some difficulty in maneuvering some of  the catheters, and it was recommended by Dr. Gala Romney to proceed with  an echocardiogram.  His renal arteries are patent bilaterally and he had  right iliac stenosis of 40%.   The patient returns to the office today for followup.  He continues to  be short of breath when he lays down.  He denies any water brash  symptoms or dyspepsia.  However, he does describe some dysphagia with  solid foods.  He describes odynophagia as well, but this seems to be  more related to pain in his  throat when he swallows, and not esophageal  pain.  He denies orthopnea or PND.  Denies any syncope.   CURRENT MEDICATIONS:  1. Pravastatin 40 mg daily.  2. Citalopram 20 mg daily.  3. Lorazepam 1 mg b.i.d.  4. Vitamin E daily.  5. Furosemide p.r.n.   ALLERGIES:  The patient describes floaters with using ASPIRIN.   PHYSICAL EXAMINATION:  He is a well-nourished, well-developed male in no  distress.  Blood pressure 118/74, pulse 66, weight 164.8 pounds.  HEENT:  Normal.  NECK:  Without JVD.  CARDIAC EXAM:  S1, S2, regular rate and rhythm without murmur.  LUNGS:  Clear to auscultation bilaterally without wheezing, rhonchi, or  rales.  ABDOMEN:  Soft, nontender, with normoactive bowel sounds, no  organomegaly.  EXTREMITIES:  Without edema, calves soft, nontender.  SKIN:  Warm and dry.  NEUROLOGIC:  He is alert and oriented x3.  Cranial nerves II-XII are  grossly intact.  Right femoral arteriotomy site without hematoma or  bruit.   Electrocardiogram reveals sinus rhythm with a heart rate 63, normal  axis, no  acute changes.   IMPRESSION:  1. Dyspnea.  2. Dysphagia.  3. Moderate nonobstructive coronary artery disease as outlined above.  4. Low-normal ejection fraction with an ejection fraction of 50% at      cardiac catheterization.  5. Treated dyslipidemia.  6. Ex smoker.  7. History of prostate cancer, status post radiation therapy.  8. Depression.   PLAN:  1. I have asked the patient to follow up with his optometrist, Dr.      Mayford Knife.  He should discuss with him the problem he has with seeing      floaters when he takes aspirin.  He would benefit from antiplatelet      therapy for primary prevention.  However, I am hesitant to      recommend that he start on aspirin or Plavix long term without      optometry evaluation.  Therefore, we will await Dr. Norris Cross      opinion regarding antiplatelet therapy before we proceed.  2. The patient will resume followup with Dr. Linna Darner  for risk factor      modification.  His goal LDL is less than 70.  3. Will proceed with an echocardiogram as recommended by Dr. Gala Romney      as he did have difficulty passing some of the catheters during his      cardiac catheterization.  4. The patient describes significant dysphagia and questionable      odynophagia.  I question whether or not his dyspnea with lying      supine could be related to a gastrointestinal etiology.  He does      describe some wheezing from time to time as well as a nonproductive      cough.  We will start omeprazole 20 mg a day and refer him to      gastroenterology for further recommendations.  5. I will bring the patient back in followup in the next 6 weeks.      Hopefully, we will have an opinion on antiplatelet therapy at that      time.  If his gastrointestinal evaluation is fairly      negative and he continues to have symptoms, we will proceed with      pulmonary workup with pulmonary function tests and possible      referral to pulmonology.      [name], PA-C  Electronically Signed      Luis Abed, MD, [place]  Electronically Signed   SW/MedQ  DD: 10/25/2007  DT: 10/25/2007  Job #: [id number]   cc:   Erasmo Downer, MD  Velna Hatchet, MD

## 2011-07-29 ENCOUNTER — Encounter: Payer: Self-pay | Admitting: Cardiology

## 2011-07-29 ENCOUNTER — Encounter: Payer: Self-pay | Admitting: *Deleted

## 2011-07-30 ENCOUNTER — Encounter: Payer: Self-pay | Admitting: *Deleted

## 2011-07-30 ENCOUNTER — Ambulatory Visit: Payer: BLUE CROSS/BLUE SHIELD | Admitting: Cardiology

## 2011-07-30 ENCOUNTER — Ambulatory Visit (INDEPENDENT_AMBULATORY_CARE_PROVIDER_SITE_OTHER): Payer: Medicare Other | Admitting: Cardiology

## 2011-07-30 VITALS — BP 136/89 | HR 84 | Ht 66.0 in | Wt 176.0 lb

## 2011-07-30 DIAGNOSIS — I739 Peripheral vascular disease, unspecified: Secondary | ICD-10-CM

## 2011-07-30 DIAGNOSIS — R609 Edema, unspecified: Secondary | ICD-10-CM

## 2011-07-30 DIAGNOSIS — R0602 Shortness of breath: Secondary | ICD-10-CM

## 2011-07-30 DIAGNOSIS — I10 Essential (primary) hypertension: Secondary | ICD-10-CM

## 2011-07-30 MED ORDER — FUROSEMIDE 20 MG PO TABS: 20.0000 mg | ORAL_TABLET | Freq: Every day | ORAL | Status: DC

## 2011-07-30 NOTE — Patient Instructions (Signed)
   Begin Lasix 20mg  daily Your physician recommends that you go to the [facility] for lab work in one week for BMET & BNP.   Arterial dopplers  If the results of your test are normal or stable, you will receive a letter.  If they are abnormal, the nurse will contact you by phone. Your physician wants you to follow up in: 6 months.  You will receive a reminder letter in the mail one-two months in advance.  If you don't receive a letter, please call our office to schedule the follow up appointment

## 2011-07-30 NOTE — Assessment & Plan Note (Signed)
We'll start diuresis as I suspect the patient may have HFPEF.

## 2011-07-30 NOTE — Progress Notes (Signed)
HPI The patient has a history of nonobstructive coronary artery disease by catheterization in 2008. He has chronic peripheral edema which appears the goals and worse. Previously had complained of some orthostatic symptoms with orthostatic blood pressures were normal. He does report occasional swimmy headedness when standing up. He reports shortness of breath on lying down or bending over. Also when walking uphill or doing a flight of stairs he becomes dyspneic. He denies any chest pain. Prior LV function right heart pressures have been within normal limits. The patient has a history of peripheral vascular disease with a 40% plaque in the right iliac artery. The patient reports pain in both lower extremities upon walking. Symptoms appeared to improve with rest.  Allergies  Allergen Reactions  . Aspirin     REACTION: SPOTS    Current Outpatient Prescriptions on File Prior to Visit  Medication Sig Dispense Refill  . atorvastatin (LIPITOR) 40 MG tablet Take 40 mg by mouth at bedtime.        . citalopram (CELEXA) 20 MG tablet Take 20 mg by mouth daily.        . clotrimazole (LOTRIMIN) 1 % cream Apply topically as directed.        . fish oil-omega-3 fatty acids 1000 MG capsule Take 1 capsule by mouth 2 (two) times daily.        Marland Kitchen LORazepam (ATIVAN) 1 MG tablet Take 1 mg by mouth 2 (two) times daily.        . nitroGLYCERIN (NITROSTAT) 0.4 MG SL tablet Place 0.4 mg under the tongue every 5 (five) minutes as needed. May repeat for up to 3 doses.       Marland Kitchen PLAVIX 75 MG tablet TAKE 1 TABLET ONCE DAILY  30 each  6  . vitamin E (VITAMIN E) 400 UNIT capsule Take 400 Units by mouth daily.          Past Medical History  Diagnosis Date  . Hyperlipidemia   . Depression   . Prostate cancer     s/p radiation treatment/s/p TURP  . Shortness of breath   . Dizzy   . CAD (coronary artery disease)     moderate by catheterization,11/08...essentially unchanged by repeat study,10/11  . Peripheral edema   . Mitral  regurgitation     mild  . Mixed dyslipidemia     Past Surgical History  Procedure Date  . Cardiac catheterization 2008  . Transurethral resection of prostate     has stress urinary incontinence    Family History  Problem Relation Age of Onset  . Heart attack Sister     in her 41's  . Other Mother     lived to be 100    History   Social History  . Marital Status: Single    Spouse Name: N/A    Number of Children: N/A  . Years of Education: N/A   Occupational History  . RETIRED     Scientist, product/process development at [employer]   Social History Main Topics  . Smoking status: Former Smoker -- 1.0 packs/day for 40 years    Types: Cigarettes    Quit date: 11/04/1983  . Smokeless tobacco: Never Used  . Alcohol Use: No  . Drug Use: Not on file  . Sexually Active: Not on file   Other Topics Concern  . Not on file   Social History Narrative   Has four childrenWorld War II Veteran/ he was in the [employer] and the [employer] during WWII   [id number]  positives as outlined above. The remainder of the 18  point review of systems is negative   PHYSICAL EXAM BP 136/89  Pulse 84  Ht 5\' 6"  (1.676 m)  Wt 176 lb (79.833 kg)  BMI 28.41 kg/m2  General: Well-developed, well-nourished in no distress Head: Normocephalic and atraumatic Eyes:PERRLA/EOMI intact, conjunctiva and lids normal Ears: No deformity or lesions Mouth:normal dentition, normal posterior pharynx Neck: Supple, no JVD.  No masses, thyromegaly or abnormal cervical nodes Lungs: Normal breath sounds bilaterally without wheezing.  Normal percussion Cardiac: regular rate and rhythm with normal S1 and S2, no S3 or S4.  PMI is normal.  No pathological murmurs Abdomen: Normal bowel sounds, abdomen is soft and nontender without masses, organomegaly or hernias noted.  No hepatosplenomegaly MSK: Back normal, normal gait muscle strength and tone normal Vascular: Pulses difficult to palpate in both lower extremities.  Femoral pulses  however are 2+.no definite bruits  Extremities: 3+ peripheral pitting edema  Neurologic: Alert and oriented x 3 Skin: Intact without lesions or rashes Lymphatics: No significant adenopathy Psychologic: Normal affect    ECG: Normal sinus rhythm nonspecific ST-T wave changes. Limited bedside echocardiogram: Apparent normal LV function, normal right heart size normal RV function.  ASSESSMENT AND PLAN

## 2011-07-30 NOTE — Assessment & Plan Note (Signed)
The patient reports symptoms that could be consistent with claudication. I'm unable to feel his pulses due to significant edema. We will obtain formal lower extremity arterial Dopplers/ABI

## 2011-07-30 NOTE — Assessment & Plan Note (Signed)
Peripheral edema appears to have worsened. The patient has normal LV function. I also reviewed his prior heart catheterization which showed normal right-sided heart pressures. It is not clear if the patient could now have HFPEF. We will start him on Lasix 20 mg a day and get a BMET in one week. We will also check a BNP level.

## 2011-07-30 NOTE — Assessment & Plan Note (Signed)
Diastolic blood pressures elevated. We will recheck his blood pressure after has been started on Lasix and hopefully this will be improved after some diuresis.

## 2011-08-07 ENCOUNTER — Encounter (INDEPENDENT_AMBULATORY_CARE_PROVIDER_SITE_OTHER): Payer: Medicare Other | Admitting: *Deleted

## 2011-08-07 DIAGNOSIS — I739 Peripheral vascular disease, unspecified: Secondary | ICD-10-CM

## 2011-08-12 ENCOUNTER — Encounter: Payer: Self-pay | Admitting: Cardiology

## 2011-08-12 LAB — POCT I-STAT 3, VENOUS BLOOD GAS (G3P V)
Acid-Base Excess: 1
Acid-Base Excess: 1
Acid-base deficit: 2
Bicarbonate: 24.7 — ABNORMAL HIGH
Bicarbonate: 26.5 — ABNORMAL HIGH
O2 Saturation: 67
O2 Saturation: 71
Operator id: 221371
TCO2: 26
TCO2: 29
pCO2, Ven: 48.4
pH, Ven: 7.373 — ABNORMAL HIGH
pO2, Ven: 40

## 2011-08-12 LAB — POCT I-STAT 3, ART BLOOD GAS (G3+)
O2 Saturation: 94
Operator id: 221371

## 2011-10-14 ENCOUNTER — Other Ambulatory Visit: Payer: Self-pay | Admitting: Cardiology

## 2012-01-13 DIAGNOSIS — IMO0001 Reserved for inherently not codable concepts without codable children: Secondary | ICD-10-CM | POA: Diagnosis not present

## 2012-01-13 DIAGNOSIS — Z125 Encounter for screening for malignant neoplasm of prostate: Secondary | ICD-10-CM | POA: Diagnosis not present

## 2012-01-13 DIAGNOSIS — F329 Major depressive disorder, single episode, unspecified: Secondary | ICD-10-CM | POA: Diagnosis not present

## 2012-01-13 DIAGNOSIS — J209 Acute bronchitis, unspecified: Secondary | ICD-10-CM | POA: Diagnosis not present

## 2012-01-13 DIAGNOSIS — R5383 Other fatigue: Secondary | ICD-10-CM | POA: Diagnosis not present

## 2012-01-13 DIAGNOSIS — E782 Mixed hyperlipidemia: Secondary | ICD-10-CM | POA: Diagnosis not present

## 2012-01-13 DIAGNOSIS — R5382 Chronic fatigue, unspecified: Secondary | ICD-10-CM | POA: Diagnosis not present

## 2012-01-13 DIAGNOSIS — N529 Male erectile dysfunction, unspecified: Secondary | ICD-10-CM | POA: Diagnosis not present

## 2012-01-13 DIAGNOSIS — E569 Vitamin deficiency, unspecified: Secondary | ICD-10-CM | POA: Diagnosis not present

## 2012-01-19 DIAGNOSIS — I6529 Occlusion and stenosis of unspecified carotid artery: Secondary | ICD-10-CM | POA: Diagnosis not present

## 2012-01-19 DIAGNOSIS — R209 Unspecified disturbances of skin sensation: Secondary | ICD-10-CM | POA: Diagnosis not present

## 2012-01-19 DIAGNOSIS — E042 Nontoxic multinodular goiter: Secondary | ICD-10-CM | POA: Diagnosis not present

## 2012-01-19 DIAGNOSIS — R42 Dizziness and giddiness: Secondary | ICD-10-CM | POA: Diagnosis not present

## 2012-01-27 ENCOUNTER — Encounter: Payer: Self-pay | Admitting: Cardiology

## 2012-01-27 ENCOUNTER — Ambulatory Visit (INDEPENDENT_AMBULATORY_CARE_PROVIDER_SITE_OTHER): Payer: Medicare Other | Admitting: Cardiology

## 2012-01-27 ENCOUNTER — Other Ambulatory Visit: Payer: Self-pay

## 2012-01-27 ENCOUNTER — Telehealth: Payer: Self-pay | Admitting: Cardiology

## 2012-01-27 VITALS — BP 117/70 | HR 79 | Ht 66.0 in | Wt 180.0 lb

## 2012-01-27 DIAGNOSIS — I2510 Atherosclerotic heart disease of native coronary artery without angina pectoris: Secondary | ICD-10-CM | POA: Diagnosis not present

## 2012-01-27 DIAGNOSIS — R609 Edema, unspecified: Secondary | ICD-10-CM

## 2012-01-27 DIAGNOSIS — E215 Disorder of parathyroid gland, unspecified: Secondary | ICD-10-CM

## 2012-01-27 DIAGNOSIS — E0590 Thyrotoxicosis, unspecified without thyrotoxic crisis or storm: Secondary | ICD-10-CM | POA: Diagnosis not present

## 2012-01-27 DIAGNOSIS — E041 Nontoxic single thyroid nodule: Secondary | ICD-10-CM | POA: Diagnosis not present

## 2012-01-27 DIAGNOSIS — R55 Syncope and collapse: Secondary | ICD-10-CM

## 2012-01-27 DIAGNOSIS — R600 Localized edema: Secondary | ICD-10-CM | POA: Insufficient documentation

## 2012-01-27 DIAGNOSIS — I779 Disorder of arteries and arterioles, unspecified: Secondary | ICD-10-CM | POA: Insufficient documentation

## 2012-01-27 DIAGNOSIS — J438 Other emphysema: Secondary | ICD-10-CM | POA: Diagnosis not present

## 2012-01-27 MED ORDER — PRAVASTATIN SODIUM 10 MG PO TABS: 10.0000 mg | ORAL_TABLET | Freq: Every day | ORAL | Status: DC

## 2012-01-27 NOTE — Telephone Encounter (Signed)
No problem.

## 2012-01-27 NOTE — Patient Instructions (Signed)
   Compression stockings  Thyroid ultrasound   Stop Plavix 5 days prior to colonoscopy  Stop Pravastatin x 2 weeks to see if helps weakness, call for update Your physician wants you to follow up in: 6 months.  You will receive a reminder letter in the mail one-two months in advance.  If you don't receive a letter, please call our office to schedule the follow up appointment

## 2012-01-27 NOTE — Telephone Encounter (Signed)
Received a telephone call from Dr. Sherril Croon office stating they are scheduling the thyroid ultrasound.

## 2012-01-27 NOTE — Progress Notes (Signed)
Warren Bottoms, MD, [facility]IM Board Certified in Adult Cardiovascular Medicine,Internal Medicine and Critical Care Medicine    CC: Followup patient coronary artery disease  HPI:  The patient is an 78 year old male with moderate coronary artery disease but no recurrent chest pain. From a cardiac perspective is been doing well. He had recent carotid Dopplers done which showed less than 50% stenosis bilaterally. He does have lower extremity edema but should not be treated with Lasix as this is related to chronic venous insufficiency and severe bilateral varicose veins. He complains of generalized weakness particularly in the lower extremities and is questioning whether he can stop the statin drug therapy for a while and see if this improves his symptoms. The patient is on aspirin he cannot tolerate this but is taking daily Plavix He denies any orthopnea PND palpitations presyncope or syncope.  PMH: reviewed and listed in Problem List in Electronic Records (and see below) Past Medical History  Diagnosis Date  . Hyperlipidemia   . Depression   . Prostate cancer     s/p radiation treatment/s/p TURP  . Shortness of breath     Emphysema  . Dizzy   . CAD (coronary artery disease)     moderate by catheterization,11/08...essentially unchanged by repeat study,10/11  . Peripheral edema   . Mitral regurgitation     mild  . Mixed dyslipidemia   . Claudication      ABIs within ABIs within normal limits, 40% left iliac disease by catheterization   . Carotid artery disease      less than 50% stenosis bilaterally March 2013   . Thyroid nodule      posterior and inferior to the right lobepossibly outside of the gland secondary to parathyroid adenoma in addition multiple bilateral thyroid nodules   . Emphysema   . Edema extremities      secondary to severe varicose veins. Nonpitting edema and dermatitis.    Past Surgical History  Procedure Date  . Cardiac catheterization 2008  . Transurethral  resection of prostate     has stress urinary incontinence    Allergies/SH/FHX : available in Electronic Records for review  Allergies  Allergen Reactions  . Aspirin     REACTION: SPOTS   History   Social History  . Marital Status: Single    Spouse Name: N/A    Number of Children: N/A  . Years of Education: N/A   Occupational History  . RETIRED     Scientist, product/process development at [employer]   Social History Main Topics  . Smoking status: Former Smoker -- 1.0 packs/day for 40 years    Types: Cigarettes    Quit date: 11/04/1983  . Smokeless tobacco: Never Used  . Alcohol Use: No  . Drug Use: Not on file  . Sexually Active: Not on file   Other Topics Concern  . Not on file   Social History Narrative   Has four childrenWorld War II Veteran/ he was in the [employer] and the [employer] during WW[employer]   Family History  Problem Relation Age of Onset  . Heart attack Sister     in her 38's  . Other Mother     lived to be 100    Medications: Current Outpatient Prescriptions  Medication Sig Dispense Refill  . citalopram (CELEXA) 20 MG tablet Take 20 mg by mouth daily.        . clopidogrel (PLAVIX) 75 MG tablet TAKE 1 TABLET ONCE DAILY  30 tablet  6  .  LORazepam (ATIVAN) 1 MG tablet Take 1 mg by mouth 2 (two) times daily.        . nitroGLYCERIN (NITROSTAT) 0.4 MG SL tablet Place 0.4 mg under the tongue every 5 (five) minutes as needed. May repeat for up to 3 doses.       . pravastatin (PRAVACHOL) 10 MG tablet Take 1 tablet (10 mg total) by mouth daily. (HOLD X 2 WEEKS)      . vitamin E (VITAMIN E) 400 UNIT capsule Take 400 Units by mouth daily.          ROS: No nausea or vomiting. No fever or chills.No melena or hematochezia.No bleeding.No claudication  Physical Exam: BP 117/70  Pulse 79  Ht 5\' 6"  (1.676 m)  Wt 180 lb (81.647 kg)  BMI 29.05 kg/m2 General: Well-nourished white male in no apparent distress. Neck: Normal carotid upstroke no carotid bruits. No thyromegaly nonnodular  thyroid Lungs: Clear breath sounds bilaterally somewhat diminished but no obvious wheezing Cardiac: Regular rate and rhythm with normal S1-S2. No pathological murmurs Vascular: 2+ edema which is nonpitting with significant bilateral varicose veins Skin: Warm and dry Physcologic: Normal affect  12lead ECG: Not obtained Limited bedside ECHO:N/A No images are attached to the encounter.    Patient Active Problem List  Diagnoses  . DYSLIPIDEMIA  . HYPERTENSION, BENIGN  . SHORTNESS OF BREATH-diagnosed with emphysema   . PRECORDIAL PAIN-stable   . Claudication-ABIs within normal limits   . Emphysema  . Thyroid nodule-rule out also parathyroid adenoma   . Carotid artery disease-nonobstructive with less than 50% stenosis bilaterally   . CAD (coronary artery disease)-status post catheterization October 2011 stable nonobstructive   . Edema extremities secondary to severe venous insufficiency     PLAN   Patient has been scheduled for a dedicated ultrasound of the thyroid gland to make sure that he does not have a parathyroid adenoma and further evaluation of thyroid nodules. Followup with his primary care physician  Compression stockings were recommended and prescribed a 20-30 mm of mercury patient does not need Lasix for this problem. His BNP is actually very low at 16 pg per ML.  Patient is unable to tolerate aspirin and will continue on Plavix.  He complains of some generalized weakness and he wants to stop his statin for 2 weeks.  The patient is scheduled for colonoscopy and I told him to Plavix can be stopped 5 days before her procedure.

## 2012-01-29 DIAGNOSIS — E042 Nontoxic multinodular goiter: Secondary | ICD-10-CM | POA: Diagnosis not present

## 2012-02-13 DIAGNOSIS — E049 Nontoxic goiter, unspecified: Secondary | ICD-10-CM | POA: Diagnosis not present

## 2012-02-13 DIAGNOSIS — E042 Nontoxic multinodular goiter: Secondary | ICD-10-CM | POA: Diagnosis not present

## 2012-02-13 DIAGNOSIS — E041 Nontoxic single thyroid nodule: Secondary | ICD-10-CM | POA: Diagnosis not present

## 2012-04-13 DIAGNOSIS — R5383 Other fatigue: Secondary | ICD-10-CM | POA: Diagnosis not present

## 2012-04-13 DIAGNOSIS — R5381 Other malaise: Secondary | ICD-10-CM | POA: Diagnosis not present

## 2012-05-27 ENCOUNTER — Telehealth: Payer: Self-pay | Admitting: Cardiology

## 2012-05-27 NOTE — Telephone Encounter (Signed)
Kim from pharmacy called and said that the patient needed a refill for Plavix 75mg  daily. I gave her verbal orders to refill medicine with 3 refills.

## 2012-06-28 ENCOUNTER — Other Ambulatory Visit: Payer: Self-pay | Admitting: Cardiology

## 2012-07-06 ENCOUNTER — Telehealth: Payer: Self-pay | Admitting: Cardiology

## 2012-07-15 DIAGNOSIS — G459 Transient cerebral ischemic attack, unspecified: Secondary | ICD-10-CM | POA: Diagnosis not present

## 2012-07-15 DIAGNOSIS — J449 Chronic obstructive pulmonary disease, unspecified: Secondary | ICD-10-CM | POA: Diagnosis not present

## 2012-07-20 DIAGNOSIS — I658 Occlusion and stenosis of other precerebral arteries: Secondary | ICD-10-CM | POA: Diagnosis not present

## 2012-07-20 DIAGNOSIS — R42 Dizziness and giddiness: Secondary | ICD-10-CM | POA: Diagnosis not present

## 2012-07-20 DIAGNOSIS — R5381 Other malaise: Secondary | ICD-10-CM | POA: Diagnosis not present

## 2012-07-20 DIAGNOSIS — I6529 Occlusion and stenosis of unspecified carotid artery: Secondary | ICD-10-CM | POA: Diagnosis not present

## 2012-07-27 ENCOUNTER — Ambulatory Visit: Payer: Medicare Other | Admitting: Cardiology

## 2012-08-10 ENCOUNTER — Ambulatory Visit: Payer: Medicare Other | Admitting: Cardiology

## 2012-08-12 DIAGNOSIS — Z23 Encounter for immunization: Secondary | ICD-10-CM | POA: Diagnosis not present

## 2012-08-30 DIAGNOSIS — S298XXA Other specified injuries of thorax, initial encounter: Secondary | ICD-10-CM | POA: Diagnosis not present

## 2012-08-30 DIAGNOSIS — S2239XA Fracture of one rib, unspecified side, initial encounter for closed fracture: Secondary | ICD-10-CM | POA: Diagnosis not present

## 2012-08-30 DIAGNOSIS — S20219A Contusion of unspecified front wall of thorax, initial encounter: Secondary | ICD-10-CM | POA: Diagnosis not present

## 2012-11-05 DIAGNOSIS — R972 Elevated prostate specific antigen [PSA]: Secondary | ICD-10-CM | POA: Diagnosis not present

## 2012-11-05 DIAGNOSIS — N393 Stress incontinence (female) (male): Secondary | ICD-10-CM | POA: Diagnosis not present

## 2012-11-30 DIAGNOSIS — IMO0001 Reserved for inherently not codable concepts without codable children: Secondary | ICD-10-CM | POA: Diagnosis not present

## 2012-11-30 DIAGNOSIS — E782 Mixed hyperlipidemia: Secondary | ICD-10-CM | POA: Diagnosis not present

## 2013-02-28 DIAGNOSIS — R5381 Other malaise: Secondary | ICD-10-CM | POA: Diagnosis not present

## 2013-02-28 DIAGNOSIS — R5383 Other fatigue: Secondary | ICD-10-CM | POA: Diagnosis not present

## 2013-05-27 DIAGNOSIS — I509 Heart failure, unspecified: Secondary | ICD-10-CM | POA: Diagnosis not present

## 2013-05-27 DIAGNOSIS — J209 Acute bronchitis, unspecified: Secondary | ICD-10-CM | POA: Diagnosis not present

## 2013-05-27 DIAGNOSIS — B953 Streptococcus pneumoniae as the cause of diseases classified elsewhere: Secondary | ICD-10-CM | POA: Diagnosis not present

## 2013-07-11 DIAGNOSIS — J449 Chronic obstructive pulmonary disease, unspecified: Secondary | ICD-10-CM | POA: Diagnosis not present

## 2013-08-17 DIAGNOSIS — Z23 Encounter for immunization: Secondary | ICD-10-CM | POA: Diagnosis not present

## 2013-10-03 ENCOUNTER — Encounter: Payer: Self-pay | Admitting: Gastroenterology

## 2013-10-11 DIAGNOSIS — J209 Acute bronchitis, unspecified: Secondary | ICD-10-CM | POA: Diagnosis not present

## 2013-10-11 DIAGNOSIS — I509 Heart failure, unspecified: Secondary | ICD-10-CM | POA: Diagnosis not present

## 2013-10-11 DIAGNOSIS — J449 Chronic obstructive pulmonary disease, unspecified: Secondary | ICD-10-CM | POA: Diagnosis not present

## 2013-10-11 DIAGNOSIS — E782 Mixed hyperlipidemia: Secondary | ICD-10-CM | POA: Diagnosis not present

## 2013-10-31 ENCOUNTER — Other Ambulatory Visit (HOSPITAL_COMMUNITY): Payer: Self-pay | Admitting: Gastroenterology

## 2013-10-31 ENCOUNTER — Ambulatory Visit (INDEPENDENT_AMBULATORY_CARE_PROVIDER_SITE_OTHER): Payer: Medicare Other | Admitting: Gastroenterology

## 2013-10-31 ENCOUNTER — Encounter: Payer: Self-pay | Admitting: Gastroenterology

## 2013-10-31 VITALS — BP 104/70 | HR 72 | Ht 66.0 in | Wt 179.1 lb

## 2013-10-31 DIAGNOSIS — R1310 Dysphagia, unspecified: Secondary | ICD-10-CM | POA: Diagnosis not present

## 2013-10-31 NOTE — Patient Instructions (Addendum)
Barium esophagram to workup your swallowing difficulty.  You have been scheduled for a Barium Esophogram at [facility] (1st floor of the hospital) on 11/04/13 at 1130 am. Please arrive 15 minutes prior to your appointment for registration. Make certain not to have anything to eat or drink 6 hours prior to your test. If you need to reschedule for any reason, please contact radiology at [phone] to do so. __________________________________________________________________ A barium swallow is an examination that concentrates on views of the esophagus. This tends to be a double contrast exam (barium and two liquids which, when combined, create a gas to distend the wall of the oesophagus) or single contrast (non-ionic iodine based). The study is usually tailored to your symptoms so a good history is essential. Attention is paid during the study to the form, structure and configuration of the esophagus, looking for functional disorders (such as aspiration, dysphagia, achalasia, motility and reflux) EXAMINATION You may be asked to change into a gown, depending on the type of swallow being performed. A radiologist and radiographer will perform the procedure. The radiologist will advise you of the type of contrast selected for your procedure and direct you during the exam. You will be asked to stand, sit or lie in several different positions and to hold a small amount of fluid in your mouth before being asked to swallow while the imaging is performed .In some instances you may be asked to swallow barium coated marshmallows to assess the motility of a solid food bolus. The exam can be recorded as a digital or video fluoroscopy procedure. POST PROCEDURE It will take 1-2 days for the barium to pass through your system. To facilitate this, it is important, unless otherwise directed, to increase your fluids for the next 24-48hrs and to resume your normal diet.  This test typically takes about 30 minutes to  perform. __________________________________________________________________________________ Modified barium swallow study with speech therapy for dysphagia as well. If this shows clear stricture then will discuss you stopping your plavix for 5 days prior to EGD with dilation.

## 2013-10-31 NOTE — Progress Notes (Signed)
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 I am meeting him for the first time. He is here with his son today.  He is in overall remarkably good health for his age.  Periodic dysphagia.  Solid food; grains corn.  This occurs about 3-4 times per month.  Going on for about 2 years. Son believes it occurs more frequently.  Weight stable.  No vomiting blood.    + periodic colonoscopies via his PCP Dr. Linna Darner.   He believes he had EGD at least 10-20 years ago.  No esophageal cancer.  Son has Barrett's.  No trouble with liquids.  Has good fitting dentures.    Was on abx for pneumonia 2 months ago.  Is on plavix.   Review of systems: Pertinent positive and negative review of systems were noted in the above HPI section. Complete review of systems was performed and was otherwise normal.    Past Medical History  Diagnosis Date  . Hyperlipidemia   . Depression   . Prostate cancer     s/p radiation treatment/s/p TURP  . Shortness of breath     Emphysema  . Dizzy   . CAD (coronary artery disease)     moderate by catheterization,11/08...essentially unchanged by repeat study,10/11  . Peripheral edema   . Mitral regurgitation     mild  . Mixed dyslipidemia   . Claudication      ABIs within ABIs within normal limits, 40% left iliac disease by catheterization   . Carotid artery disease      less than 50% stenosis bilaterally March 2013   . Thyroid nodule      posterior and inferior to the right lobepossibly outside of the gland secondary to parathyroid adenoma in addition multiple bilateral thyroid nodules   . Emphysema   . Edema extremities      secondary to severe varicose veins. Nonpitting edema and dermatitis.   . Colon polyps   . Glaucoma   . Pneumonia   . IBS (irritable bowel syndrome)     Past Surgical History  Procedure Laterality Date  . Cardiac catheterization  2008  . Transurethral resection of prostate      has stress urinary  incontinence  . Cholecystectomy    . Incontinence surgery      ring inserted  . Tonsillectomy      Current Outpatient Prescriptions  Medication Sig Dispense Refill  . Cholecalciferol (VITAMIN D-3) 1000 UNITS CAPS Take 1 capsule by mouth daily.      . citalopram (CELEXA) 20 MG tablet Take 20 mg by mouth daily.        . clopidogrel (PLAVIX) 75 MG tablet TAKE 1 TABLET ONCE DAILY  30 tablet  6  . Krill Oil 300 MG CAPS Take 1 capsule by mouth daily.      Marland Kitchen LORazepam (ATIVAN) 1 MG tablet Take 1 mg by mouth 2 (two) times daily.        . pravastatin (PRAVACHOL) 10 MG tablet Take 1 tablet (10 mg total) by mouth daily. (HOLD X 2 WEEKS)      . nitroGLYCERIN (NITROSTAT) 0.4 MG SL tablet Place 0.4 mg under the tongue every 5 (five) minutes as needed. May repeat for up to 3 doses.       . Omega-3 Fatty Acids (FISH OIL) 1000 MG CAPS Take 1 capsule by mouth daily.      . [DISCONTINUED] atorvastatin (LIPITOR) 40 MG tablet Take 40 mg by mouth at  bedtime.        . [DISCONTINUED] furosemide (LASIX) 20 MG tablet Take 1 tablet (20 mg total) by mouth daily.  30 tablet  6   No current facility-administered medications for this visit.    Allergies as of 10/31/2013 - Review Complete 10/31/2013  Allergen Reaction Noted  . Aspirin      Family History  Problem Relation Age of Onset  . Heart attack Sister     in her 25's  . Other Mother     lived to be 100  . Pancreatic cancer Father     History   Social History  . Marital Status: Single    Spouse Name: N/A    Number of Children: 4  . Years of Education: N/A   Occupational History  . RETIRED     Scientist, product/process development at [employer]   Social History Main Topics  . Smoking status: Former Smoker -- 1.00 packs/day for 40 years    Types: Cigarettes    Quit date: 11/04/1983  . Smokeless tobacco: Never Used  . Alcohol Use: No  . Drug Use: No  . Sexual Activity: Not on file   Other Topics Concern  . 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 [employer] during [employer]       Physical Exam: Ht 5\' 6"  (1.676 m)  Wt 179 lb 2 oz (81.251 kg)  BMI 28.93 kg/m2 Constitutional: generally well-appearing Psychiatric: alert and oriented x3 Eyes: extraocular movements intact Mouth: oral pharynx moist, no lesions Neck: supple no lymphadenopathy Cardiovascular: heart regular rate and rhythm Lungs: clear to auscultation bilaterally Abdomen: soft, nontender, nondistended, no obvious ascites, no peritoneal signs, normal bowel sounds Extremities: no lower extremity edema bilaterally Skin: no lesions on visible extremities    Assessment and plan: 79 y.o. male with  2 years of intermittent dysphagia, current Plavix use  Points to the right side of his neck at the site of periodic swallowing difficulty. This is not typical for usual esophageal dysphasia with his esophageal stricture related. I saw nothing in his mouth or oropharynx to explain his symptoms. Going to set him up with barium esophagram, modified barium swallow testing with speech therapist for first evaluation. If any clear strictures are noted now like to proceed with holding his Plavix and EGD with dilation. If no clear strictures are needed that he still may require EGD or diagnostic purposes I would not need to hold his Plavix at that point.

## 2013-11-04 ENCOUNTER — Ambulatory Visit (HOSPITAL_COMMUNITY)
Admission: RE | Admit: 2013-11-04 | Discharge: 2013-11-04 | Disposition: A | Discharge status: Home / Self Care | Payer: Medicare Other | Source: Ambulatory Visit | Attending: Gastroenterology | Admitting: Gastroenterology

## 2013-11-04 ENCOUNTER — Ambulatory Visit (HOSPITAL_COMMUNITY): Payer: Medicare Other

## 2013-11-04 DIAGNOSIS — R1310 Dysphagia, unspecified: Secondary | ICD-10-CM

## 2013-11-04 DIAGNOSIS — K224 Dyskinesia of esophagus: Secondary | ICD-10-CM | POA: Diagnosis not present

## 2013-11-04 NOTE — Procedures (Signed)
Objective Swallowing Evaluation: Modified Barium Swallowing Study  Patient Details  Name: Warren [name] MRN: [id number] Date of Birth: June 25, 1922  Today's Date: 11/04/2013 Time: 1300-1315 SLP Time Calculation (min): 15 min  Past Medical History:  Past Medical History  Diagnosis Date  . Hyperlipidemia   . Depression   . Prostate cancer     s/p radiation treatment/s/p TURP  . Shortness of breath     Emphysema  . Dizzy   . CAD (coronary artery disease)     moderate by catheterization,11/08...essentially unchanged by repeat study,10/11  . Peripheral edema   . Mitral regurgitation     mild  . Mixed dyslipidemia   . Claudication      ABIs within ABIs within normal limits, 40% left iliac disease by catheterization   . Carotid artery disease      less than 50% stenosis bilaterally March 2013   . Thyroid nodule      posterior and inferior to the right lobepossibly outside of the gland secondary to parathyroid adenoma in addition multiple bilateral thyroid nodules   . Emphysema   . Edema extremities      secondary to severe varicose veins. Nonpitting edema and dermatitis.   . Colon polyps   . Glaucoma   . Pneumonia   . IBS (irritable bowel syndrome)    Past Surgical History:  Past Surgical History  Procedure Laterality Date  . Cardiac catheterization  2008  . Transurethral resection of prostate      has stress urinary incontinence  . Cholecystectomy    . Incontinence surgery      ring inserted  . Tonsillectomy     HPI:  80 yo male referred by Dr Ardis Hughs for MBS/esophagram due to concerns for dysphagia to solid foods.  Pt reports occasional sensation of solid food lodging in pharynx requiring him to regurgiate it.  Issues occur approximately 3-4 times a week over the last year and can be quite distressing to pt per his report.  Pt PMH + for HLD, depression, prostate cancer, peripheral edema, mitral regurgitation, dizzy, pna, glaucoma, emphysema, thyroid nodule, CAD,   Pt  denies weight loss nor requiring heimlich manuever.  He admits to having a hard time swallowing pills.    Cough can be productaive to clear or yellow tinged secretions and pt reports becoming short of breath when lying flat in bed.       Assessment / Plan / Recommendation Clinical Impression  Dysphagia Diagnosis: Minimal oropharyngeal dysphagia, Suspected primary esophageal dysphagia  Clinical impression: Minimal oropharyngeal dysphagia noted without aspiration or penetration of any consistency tested.  Pt piecemealed liquids which was functional, uncontrolled premature spillage of oral liquid residue noted x1 with delayed swallow.  Mild vallecular/pharyngeal residuals cleared with delayed reflexive swallow.   Pt appeared with prominent cricopharyngeus that appeared to trap a small amount of barium - liquids aid clearance.  Cervical osteophyte type structure noted from C5-C6.  Suspect primary esophageal dysphagia.    Skilled intervention included educating pt to findings, reinforcing effective compensation strategies.  No further SLP indicated.  Thanks for this referral.     Treatment Recommendation  No treatment recommended at this time    Diet Recommendation Regular;Thin liquid   Liquid Administration via: Cup;Straw Medication Administration:  (as tolerated, take with pureed -followed by water if helpful) Supervision: Patient able to self feed Compensations: Slow rate;Small sips/bites (consume liquids throughout meal) Postural Changes and/or Swallow Maneuvers: Seated upright 90 degrees;Upright 30-60 min after meal    Other  Recommendations Oral Care Recommendations: Oral care BID              General Date of Onset: 11/04/13 HPI: 80 yo male referred by Dr Ardis Hughs for MBS/esophagram due to concerns for dysphagia to solid foods.  Pt reports occasional sensation of solid food lodging in pharynx requiring him to regurgiate it.  Issues occur approximately 3-4 times a week over the last year  and can be quite distressing to pt per his report.  Pt PMH + for HLD, depression, prostate cancer, peripheral edema, mitral regurgitation, dizzy, pna, glaucoma, emphysema, thyroid nodule, CAD,   Pt denies weight loss nor requiring heimlich manuever.  He admits to having a hard time swallowing pills.    Cough can be productaive to clear or yellow tinged secretions and pt reports becoming short of breath when lying flat in bed.   Type of Study: Modified Barium Swallowing Study Reason for Referral: Objectively evaluate swallowing function Diet Prior to this Study: Regular;Thin liquids Temperature Spikes Noted: No Respiratory Status: Room air History of Recent Intubation: No Behavior/Cognition: Alert;Cooperative;Pleasant mood Oral Cavity - Dentition: Dentures, bottom;Dentures, top Oral Motor / Sensory Function: Within functional limits Self-Feeding Abilities: Able to feed self Patient Positioning: Upright in bed Baseline Vocal Quality: Clear Volitional Cough: Strong Volitional Swallow: Able to elicit Anatomy: Within functional limits (appearance of cervical ostephyte at C5-C6)    Reason for Referral Objectively evaluate swallowing function   Oral Phase Oral Preparation/Oral Phase Oral Phase: WFL (minimal oral residuals spill into pharynx with delayed swallow) Oral - Nectar Oral - Nectar Cup: Piecemeal swallowing Oral - Thin Oral - Thin Cup: Piecemeal swallowing Oral - Thin Straw: Piecemeal swallowing Oral - Solids Oral - Puree: Within functional limits Oral - Regular: Within functional limits   Pharyngeal Phase Pharyngeal Phase Pharyngeal Phase: Within functional limits (delay swallow aided clearance of minimal residuals and spilled barium) Pharyngeal - Nectar Pharyngeal - Nectar Cup: Pharyngeal residue - valleculae Pharyngeal - Thin Pharyngeal - Thin Cup: Pharyngeal residue - valleculae Pharyngeal - Thin Straw: Pharyngeal residue - valleculae Pharyngeal - Solids Pharyngeal - Puree:  Within functional limits Pharyngeal - Regular: Within functional limits  Cervical Esophageal Phase    GO    Cervical Esophageal Phase Cervical Esophageal Phase: Impaired Cervical Esophageal Phase - Nectar Nectar Cup: Prominent cricopharyngeal segment Cervical Esophageal Phase - Thin Thin Cup: Prominent cricopharyngeal segment Thin Straw: Prominent cricopharyngeal segment Cervical Esophageal Phase - Solids Puree: Prominent cricopharyngeal segment Regular: Prominent cricopharyngeal segment Cervical Esophageal Phase - Comment Cervical Esophageal Comment: appearance of residuals below cricopharyngeus without pt awareness, liquid swallows aid clearance- note radiologist documented pt with presbyesophagus    Functional Assessment Tool Used: mbs, clinical judgement Functional Limitations: Swallowing Swallow Current Status ([id number]): At least 1 percent but less than 20 percent impaired, limited or restricted Swallow Goal Status [phone]): At least 1 percent but less than 20 percent impaired, limited or restricted Swallow Discharge Status [phone]): At least 1 percent but less than 20 percent impaired, limited or restricted   Luanna Salk, [facility] SLP [phone]

## 2013-12-13 ENCOUNTER — Encounter: Payer: Self-pay | Admitting: Gastroenterology

## 2013-12-13 ENCOUNTER — Ambulatory Visit (INDEPENDENT_AMBULATORY_CARE_PROVIDER_SITE_OTHER): Payer: Medicare Other | Admitting: Gastroenterology

## 2013-12-13 VITALS — BP 120/60 | HR 80 | Ht 66.0 in | Wt 177.4 lb

## 2013-12-13 DIAGNOSIS — R1314 Dysphagia, pharyngoesophageal phase: Secondary | ICD-10-CM

## 2013-12-13 NOTE — Patient Instructions (Signed)
1. Recommendations from speech therapist following MBSS 10/2013:  Liquid Administration via: Cup;Straw   Medication Administration: (as tolerated, take with pureed -followed by water if helpful)   Supervision: Patient able to self feed   Compensations: Slow rate;Small sips/bites (consume liquids throughout meal)   Postural Changes and/or Swallow Maneuvers: Seated upright 90 degrees;Upright 30-60 min after meal   2. If the above recommendations do not help, then please call Dr. Ardis Hughs.

## 2013-12-13 NOTE — Progress Notes (Signed)
Review of pertinent gastrointestinal problems: 1. Dysphagia: Barium esophagram 10/2013: Moderate esophageal dysmotility, likely presbyesophagus. Delayed passage of a 13 mm barium tablet at the gastroesophageal junction. No anatomic cause identified. Likely related to dysmotility.  MBSS 10/2013: Dysphagia Diagnosis: Minimal oropharyngeal dysphagia, Suspected primary esophageal dysphagia Clinical impression: Minimal oropharyngeal dysphagia noted without aspiration or penetration of any consistency tested. Pt piecemealed liquids which was functional, uncontrolled premature spillage of oral liquid residue noted x1 with delayed swallow. Mild vallecular/pharyngeal residuals cleared with delayed reflexive swallow. Pt appeared with prominent cricopharyngeus that appeared to trap a small amount of barium - liquids aid clearance. Cervical osteophyte type structure noted from C5-C6. Suspect primary esophageal dysphagia.    HPI: This is a  very pleasant 80 year old man whom I am seeing today. I last saw him about 2 months ago.  He did not recall any of the recommendations from the speech therapist regarding his intermittent dysphasia.  These are cut and pasted from the speech therapy recommendations. Recommendations from speech therapist following MBSS 10/2013: Liquid Administration via: Cup;Straw  Medication Administration: (as tolerated, take with pureed -followed by water if helpful)  Supervision: Patient able to self feed  Compensations: Slow rate;Small sips/bites (consume liquids throughout meal)  Postural Changes and/or Swallow Maneuvers: Seated upright 90 degrees;Upright 30-60 min after meal     Past Medical History  Diagnosis Date  . Hyperlipidemia   . Depression   . Prostate cancer     s/p radiation treatment/s/p TURP  . Shortness of breath     Emphysema  . Dizzy   . CAD (coronary artery disease)     moderate by catheterization,11/08...essentially unchanged by repeat study,10/11  .  Peripheral edema   . Mitral regurgitation     mild  . Mixed dyslipidemia   . Claudication      ABIs within ABIs within normal limits, 40% left iliac disease by catheterization   . Carotid artery disease      less than 50% stenosis bilaterally March 2013   . Thyroid nodule      posterior and inferior to the right lobepossibly outside of the gland secondary to parathyroid adenoma in addition multiple bilateral thyroid nodules   . Emphysema   . Edema extremities      secondary to severe varicose veins. Nonpitting edema and dermatitis.   . Colon polyps   . Glaucoma   . Pneumonia   . IBS (irritable bowel syndrome)     Past Surgical History  Procedure Laterality Date  . Cardiac catheterization  2008  . Transurethral resection of prostate      has stress urinary incontinence  . Cholecystectomy    . Incontinence surgery      ring inserted  . Tonsillectomy      Current Outpatient Prescriptions  Medication Sig Dispense Refill  . Cholecalciferol (VITAMIN D-3) 1000 UNITS CAPS Take 1 capsule by mouth daily.      . citalopram (CELEXA) 20 MG tablet Take 20 mg by mouth daily.        . clopidogrel (PLAVIX) 75 MG tablet TAKE 1 TABLET ONCE DAILY  30 tablet  6  . Krill Oil 300 MG CAPS Take 1 capsule by mouth daily.      Marland Kitchen LORazepam (ATIVAN) 1 MG tablet Take 1 mg by mouth 2 (two) times daily.        . nitroGLYCERIN (NITROSTAT) 0.4 MG SL tablet Place 0.4 mg under the tongue every 5 (five) minutes as needed. May repeat for up to 3 doses.       Marland Kitchen  pravastatin (PRAVACHOL) 10 MG tablet Take 1 tablet (10 mg total) by mouth daily. (HOLD X 2 WEEKS)      . [DISCONTINUED] atorvastatin (LIPITOR) 40 MG tablet Take 40 mg by mouth at bedtime.        . [DISCONTINUED] furosemide (LASIX) 20 MG tablet Take 1 tablet (20 mg total) by mouth daily.  30 tablet  6   No current facility-administered medications for this visit.    Allergies as of 12/13/2013 - Review Complete 12/13/2013  Allergen Reaction Noted  .  Aspirin      Family History  Problem Relation Age of Onset  . Heart attack Sister     in her 28's  . Other Mother     lived to be 75  . Pancreatic cancer Father     History   Social History  . Marital Status: Single    Spouse Name: N/A    Number of Children: 4  . Years of Education: N/A   Occupational History  . RETIRED     Engineer, manufacturing systems at [employer] History Main Topics  . Smoking status: Former Smoker -- 1.00 packs/day for 40 years    Types: Cigarettes    Quit date: 11/04/1983  . Smokeless tobacco: Never Used  . Alcohol Use: No  . Drug Use: No  . Sexual Activity: Not on file   Other Topics Concern  . 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 male with dysphasia, likely esophageal dysmotility  He is 80 years old and I would not recommend any further testing at this point. Speech therapy gave several very good recommendations about how to help his dysmotility related dysphagia symptoms. I went over these in detail with him again in the office today. He did not recall discussing these with the speech therapist 2 months ago. Can have a written copy of the recommendations as well. He knows to call here if any problems or concerns or if these changes to his eating do not help.

## 2014-01-02 DIAGNOSIS — N3942 Incontinence without sensory awareness: Secondary | ICD-10-CM | POA: Diagnosis not present

## 2014-01-02 DIAGNOSIS — R972 Elevated prostate specific antigen [PSA]: Secondary | ICD-10-CM | POA: Diagnosis not present

## 2014-01-09 DIAGNOSIS — J449 Chronic obstructive pulmonary disease, unspecified: Secondary | ICD-10-CM | POA: Diagnosis not present

## 2014-04-10 DIAGNOSIS — J449 Chronic obstructive pulmonary disease, unspecified: Secondary | ICD-10-CM | POA: Diagnosis not present

## 2014-04-10 DIAGNOSIS — I7389 Other specified peripheral vascular diseases: Secondary | ICD-10-CM | POA: Diagnosis not present

## 2014-04-10 DIAGNOSIS — Z0000 Encounter for general adult medical examination without abnormal findings: Secondary | ICD-10-CM | POA: Diagnosis not present

## 2014-05-04 DIAGNOSIS — Z86010 Personal history of colonic polyps: Secondary | ICD-10-CM | POA: Diagnosis not present

## 2014-05-04 DIAGNOSIS — R198 Other specified symptoms and signs involving the digestive system and abdomen: Secondary | ICD-10-CM | POA: Diagnosis not present

## 2014-06-15 DIAGNOSIS — Z79899 Other long term (current) drug therapy: Secondary | ICD-10-CM | POA: Diagnosis not present

## 2014-06-15 DIAGNOSIS — M810 Age-related osteoporosis without current pathological fracture: Secondary | ICD-10-CM | POA: Diagnosis not present

## 2014-06-15 DIAGNOSIS — M899 Disorder of bone, unspecified: Secondary | ICD-10-CM | POA: Diagnosis not present

## 2014-06-15 DIAGNOSIS — C61 Malignant neoplasm of prostate: Secondary | ICD-10-CM | POA: Diagnosis not present

## 2014-06-15 DIAGNOSIS — Z87891 Personal history of nicotine dependence: Secondary | ICD-10-CM | POA: Diagnosis not present

## 2014-06-15 DIAGNOSIS — M949 Disorder of cartilage, unspecified: Secondary | ICD-10-CM | POA: Diagnosis not present

## 2014-07-11 DIAGNOSIS — Z79899 Other long term (current) drug therapy: Secondary | ICD-10-CM | POA: Diagnosis not present

## 2014-07-11 DIAGNOSIS — E785 Hyperlipidemia, unspecified: Secondary | ICD-10-CM | POA: Diagnosis not present

## 2014-07-11 DIAGNOSIS — K922 Gastrointestinal hemorrhage, unspecified: Secondary | ICD-10-CM | POA: Diagnosis not present

## 2014-07-11 DIAGNOSIS — Z7902 Long term (current) use of antithrombotics/antiplatelets: Secondary | ICD-10-CM | POA: Diagnosis not present

## 2014-07-11 DIAGNOSIS — I739 Peripheral vascular disease, unspecified: Secondary | ICD-10-CM | POA: Diagnosis not present

## 2014-07-11 DIAGNOSIS — K921 Melena: Secondary | ICD-10-CM | POA: Diagnosis not present

## 2014-07-11 DIAGNOSIS — F341 Dysthymic disorder: Secondary | ICD-10-CM | POA: Diagnosis not present

## 2014-07-12 DIAGNOSIS — F341 Dysthymic disorder: Secondary | ICD-10-CM | POA: Diagnosis not present

## 2014-07-12 DIAGNOSIS — K922 Gastrointestinal hemorrhage, unspecified: Secondary | ICD-10-CM | POA: Diagnosis not present

## 2014-07-12 DIAGNOSIS — Z79899 Other long term (current) drug therapy: Secondary | ICD-10-CM | POA: Diagnosis not present

## 2014-07-12 DIAGNOSIS — Z7902 Long term (current) use of antithrombotics/antiplatelets: Secondary | ICD-10-CM | POA: Diagnosis not present

## 2014-07-12 DIAGNOSIS — I739 Peripheral vascular disease, unspecified: Secondary | ICD-10-CM | POA: Diagnosis not present

## 2014-07-12 DIAGNOSIS — E785 Hyperlipidemia, unspecified: Secondary | ICD-10-CM | POA: Diagnosis not present

## 2014-07-18 DIAGNOSIS — D378 Neoplasm of uncertain behavior of other specified digestive organs: Secondary | ICD-10-CM | POA: Diagnosis not present

## 2014-07-18 DIAGNOSIS — Z79899 Other long term (current) drug therapy: Secondary | ICD-10-CM | POA: Diagnosis not present

## 2014-07-18 DIAGNOSIS — K625 Hemorrhage of anus and rectum: Secondary | ICD-10-CM | POA: Diagnosis not present

## 2014-07-18 DIAGNOSIS — Z86010 Personal history of colonic polyps: Secondary | ICD-10-CM | POA: Diagnosis not present

## 2014-07-18 DIAGNOSIS — Z7902 Long term (current) use of antithrombotics/antiplatelets: Secondary | ICD-10-CM | POA: Diagnosis not present

## 2014-07-18 DIAGNOSIS — D375 Neoplasm of uncertain behavior of rectum: Secondary | ICD-10-CM | POA: Diagnosis not present

## 2014-07-18 DIAGNOSIS — D371 Neoplasm of uncertain behavior of stomach: Secondary | ICD-10-CM | POA: Diagnosis not present

## 2014-07-18 DIAGNOSIS — D126 Benign neoplasm of colon, unspecified: Secondary | ICD-10-CM | POA: Diagnosis not present

## 2014-07-18 DIAGNOSIS — R198 Other specified symptoms and signs involving the digestive system and abdomen: Secondary | ICD-10-CM | POA: Diagnosis not present

## 2014-07-18 DIAGNOSIS — Z7982 Long term (current) use of aspirin: Secondary | ICD-10-CM | POA: Diagnosis not present

## 2014-07-21 DIAGNOSIS — K921 Melena: Secondary | ICD-10-CM | POA: Diagnosis not present

## 2014-07-21 DIAGNOSIS — D126 Benign neoplasm of colon, unspecified: Secondary | ICD-10-CM | POA: Diagnosis not present

## 2014-07-31 DIAGNOSIS — D126 Benign neoplasm of colon, unspecified: Secondary | ICD-10-CM | POA: Diagnosis not present

## 2014-08-10 DIAGNOSIS — Z23 Encounter for immunization: Secondary | ICD-10-CM | POA: Diagnosis not present

## 2014-10-20 DIAGNOSIS — I679 Cerebrovascular disease, unspecified: Secondary | ICD-10-CM | POA: Diagnosis not present

## 2014-10-20 DIAGNOSIS — R5383 Other fatigue: Secondary | ICD-10-CM | POA: Diagnosis not present

## 2014-10-20 DIAGNOSIS — Z13220 Encounter for screening for lipoid disorders: Secondary | ICD-10-CM | POA: Diagnosis not present

## 2014-10-25 DIAGNOSIS — R930 Abnormal findings on diagnostic imaging of skull and head, not elsewhere classified: Secondary | ICD-10-CM | POA: Diagnosis not present

## 2014-10-25 DIAGNOSIS — R42 Dizziness and giddiness: Secondary | ICD-10-CM | POA: Diagnosis not present

## 2014-10-25 DIAGNOSIS — I679 Cerebrovascular disease, unspecified: Secondary | ICD-10-CM | POA: Diagnosis not present

## 2015-01-09 DIAGNOSIS — N393 Stress incontinence (female) (male): Secondary | ICD-10-CM | POA: Diagnosis not present

## 2015-01-09 DIAGNOSIS — R972 Elevated prostate specific antigen [PSA]: Secondary | ICD-10-CM | POA: Diagnosis not present

## 2015-01-19 DIAGNOSIS — R5383 Other fatigue: Secondary | ICD-10-CM | POA: Diagnosis not present

## 2015-01-19 DIAGNOSIS — I679 Cerebrovascular disease, unspecified: Secondary | ICD-10-CM | POA: Diagnosis not present

## 2015-04-20 DIAGNOSIS — J449 Chronic obstructive pulmonary disease, unspecified: Secondary | ICD-10-CM | POA: Diagnosis not present

## 2015-04-20 DIAGNOSIS — Z1389 Encounter for screening for other disorder: Secondary | ICD-10-CM | POA: Diagnosis not present

## 2015-04-20 DIAGNOSIS — I679 Cerebrovascular disease, unspecified: Secondary | ICD-10-CM | POA: Diagnosis not present

## 2015-04-20 DIAGNOSIS — Z0000 Encounter for general adult medical examination without abnormal findings: Secondary | ICD-10-CM | POA: Diagnosis not present

## 2015-06-06 DIAGNOSIS — N3942 Incontinence without sensory awareness: Secondary | ICD-10-CM | POA: Diagnosis not present

## 2015-06-06 DIAGNOSIS — R972 Elevated prostate specific antigen [PSA]: Secondary | ICD-10-CM | POA: Diagnosis not present

## 2015-07-23 DIAGNOSIS — R1310 Dysphagia, unspecified: Secondary | ICD-10-CM | POA: Diagnosis not present

## 2015-07-23 DIAGNOSIS — M6281 Muscle weakness (generalized): Secondary | ICD-10-CM | POA: Diagnosis not present

## 2015-07-23 DIAGNOSIS — I6350 Cerebral infarction due to unspecified occlusion or stenosis of unspecified cerebral artery: Secondary | ICD-10-CM | POA: Diagnosis not present

## 2015-07-23 DIAGNOSIS — J449 Chronic obstructive pulmonary disease, unspecified: Secondary | ICD-10-CM | POA: Diagnosis not present

## 2015-09-10 DIAGNOSIS — R1319 Other dysphagia: Secondary | ICD-10-CM | POA: Diagnosis not present

## 2015-09-25 DIAGNOSIS — Z800 Family history of malignant neoplasm of digestive organs: Secondary | ICD-10-CM | POA: Diagnosis not present

## 2015-09-25 DIAGNOSIS — Z808 Family history of malignant neoplasm of other organs or systems: Secondary | ICD-10-CM | POA: Diagnosis not present

## 2015-09-25 DIAGNOSIS — R1310 Dysphagia, unspecified: Secondary | ICD-10-CM | POA: Diagnosis not present

## 2015-09-25 DIAGNOSIS — Z8489 Family history of other specified conditions: Secondary | ICD-10-CM | POA: Diagnosis not present

## 2015-09-25 DIAGNOSIS — Z8249 Family history of ischemic heart disease and other diseases of the circulatory system: Secondary | ICD-10-CM | POA: Diagnosis not present

## 2015-09-25 DIAGNOSIS — J45909 Unspecified asthma, uncomplicated: Secondary | ICD-10-CM | POA: Diagnosis not present

## 2015-09-25 DIAGNOSIS — E785 Hyperlipidemia, unspecified: Secondary | ICD-10-CM | POA: Diagnosis not present

## 2015-09-25 DIAGNOSIS — Z7902 Long term (current) use of antithrombotics/antiplatelets: Secondary | ICD-10-CM | POA: Diagnosis not present

## 2015-09-25 DIAGNOSIS — Z888 Allergy status to other drugs, medicaments and biological substances status: Secondary | ICD-10-CM | POA: Diagnosis not present

## 2015-09-25 DIAGNOSIS — Z8673 Personal history of transient ischemic attack (TIA), and cerebral infarction without residual deficits: Secondary | ICD-10-CM | POA: Diagnosis not present

## 2015-09-25 DIAGNOSIS — Z8546 Personal history of malignant neoplasm of prostate: Secondary | ICD-10-CM | POA: Diagnosis not present

## 2015-09-25 DIAGNOSIS — Z79899 Other long term (current) drug therapy: Secondary | ICD-10-CM | POA: Diagnosis not present

## 2015-09-25 DIAGNOSIS — Z87891 Personal history of nicotine dependence: Secondary | ICD-10-CM | POA: Diagnosis not present

## 2015-09-25 DIAGNOSIS — F419 Anxiety disorder, unspecified: Secondary | ICD-10-CM | POA: Diagnosis not present

## 2015-09-25 DIAGNOSIS — R1319 Other dysphagia: Secondary | ICD-10-CM | POA: Diagnosis not present

## 2015-09-25 DIAGNOSIS — K449 Diaphragmatic hernia without obstruction or gangrene: Secondary | ICD-10-CM | POA: Diagnosis not present

## 2015-09-25 DIAGNOSIS — I10 Essential (primary) hypertension: Secondary | ICD-10-CM | POA: Diagnosis not present

## 2015-09-25 DIAGNOSIS — Z833 Family history of diabetes mellitus: Secondary | ICD-10-CM | POA: Diagnosis not present

## 2015-10-16 DIAGNOSIS — Z23 Encounter for immunization: Secondary | ICD-10-CM | POA: Diagnosis not present

## 2015-10-22 DIAGNOSIS — I638 Other cerebral infarction: Secondary | ICD-10-CM | POA: Diagnosis not present

## 2015-10-22 DIAGNOSIS — I10 Essential (primary) hypertension: Secondary | ICD-10-CM | POA: Diagnosis not present

## 2016-01-21 DIAGNOSIS — I10 Essential (primary) hypertension: Secondary | ICD-10-CM | POA: Diagnosis not present

## 2016-01-21 DIAGNOSIS — I638 Other cerebral infarction: Secondary | ICD-10-CM | POA: Diagnosis not present

## 2016-01-23 DIAGNOSIS — R262 Difficulty in walking, not elsewhere classified: Secondary | ICD-10-CM | POA: Diagnosis not present

## 2016-01-23 DIAGNOSIS — R531 Weakness: Secondary | ICD-10-CM | POA: Diagnosis not present

## 2016-01-23 DIAGNOSIS — R296 Repeated falls: Secondary | ICD-10-CM | POA: Diagnosis not present

## 2016-01-25 DIAGNOSIS — R296 Repeated falls: Secondary | ICD-10-CM | POA: Diagnosis not present

## 2016-01-25 DIAGNOSIS — R262 Difficulty in walking, not elsewhere classified: Secondary | ICD-10-CM | POA: Diagnosis not present

## 2016-01-25 DIAGNOSIS — R531 Weakness: Secondary | ICD-10-CM | POA: Diagnosis not present

## 2016-01-29 DIAGNOSIS — R262 Difficulty in walking, not elsewhere classified: Secondary | ICD-10-CM | POA: Diagnosis not present

## 2016-01-29 DIAGNOSIS — R531 Weakness: Secondary | ICD-10-CM | POA: Diagnosis not present

## 2016-01-29 DIAGNOSIS — R296 Repeated falls: Secondary | ICD-10-CM | POA: Diagnosis not present

## 2016-01-31 DIAGNOSIS — R262 Difficulty in walking, not elsewhere classified: Secondary | ICD-10-CM | POA: Diagnosis not present

## 2016-01-31 DIAGNOSIS — R296 Repeated falls: Secondary | ICD-10-CM | POA: Diagnosis not present

## 2016-01-31 DIAGNOSIS — R531 Weakness: Secondary | ICD-10-CM | POA: Diagnosis not present

## 2016-02-05 DIAGNOSIS — R531 Weakness: Secondary | ICD-10-CM | POA: Diagnosis not present

## 2016-02-05 DIAGNOSIS — R296 Repeated falls: Secondary | ICD-10-CM | POA: Diagnosis not present

## 2016-02-07 DIAGNOSIS — R531 Weakness: Secondary | ICD-10-CM | POA: Diagnosis not present

## 2016-02-07 DIAGNOSIS — R296 Repeated falls: Secondary | ICD-10-CM | POA: Diagnosis not present

## 2016-02-12 DIAGNOSIS — R296 Repeated falls: Secondary | ICD-10-CM | POA: Diagnosis not present

## 2016-02-12 DIAGNOSIS — R531 Weakness: Secondary | ICD-10-CM | POA: Diagnosis not present

## 2016-02-14 DIAGNOSIS — R296 Repeated falls: Secondary | ICD-10-CM | POA: Diagnosis not present

## 2016-02-14 DIAGNOSIS — R531 Weakness: Secondary | ICD-10-CM | POA: Diagnosis not present

## 2016-04-16 DIAGNOSIS — I10 Essential (primary) hypertension: Secondary | ICD-10-CM | POA: Diagnosis not present

## 2016-04-16 DIAGNOSIS — E784 Other hyperlipidemia: Secondary | ICD-10-CM | POA: Diagnosis not present

## 2016-04-22 DIAGNOSIS — I638 Other cerebral infarction: Secondary | ICD-10-CM | POA: Diagnosis not present

## 2016-04-22 DIAGNOSIS — I10 Essential (primary) hypertension: Secondary | ICD-10-CM | POA: Diagnosis not present

## 2016-04-22 DIAGNOSIS — E784 Other hyperlipidemia: Secondary | ICD-10-CM | POA: Diagnosis not present

## 2016-07-08 DIAGNOSIS — J3089 Other allergic rhinitis: Secondary | ICD-10-CM | POA: Diagnosis not present

## 2016-07-08 DIAGNOSIS — I638 Other cerebral infarction: Secondary | ICD-10-CM | POA: Diagnosis not present

## 2016-07-08 DIAGNOSIS — E784 Other hyperlipidemia: Secondary | ICD-10-CM | POA: Diagnosis not present

## 2016-07-08 DIAGNOSIS — I10 Essential (primary) hypertension: Secondary | ICD-10-CM | POA: Diagnosis not present

## 2016-08-05 DIAGNOSIS — Z23 Encounter for immunization: Secondary | ICD-10-CM | POA: Diagnosis not present

## 2016-09-05 DIAGNOSIS — I10 Essential (primary) hypertension: Secondary | ICD-10-CM | POA: Diagnosis not present

## 2016-09-05 DIAGNOSIS — E784 Other hyperlipidemia: Secondary | ICD-10-CM | POA: Diagnosis not present

## 2016-10-08 DIAGNOSIS — E784 Other hyperlipidemia: Secondary | ICD-10-CM | POA: Diagnosis not present

## 2016-10-08 DIAGNOSIS — I10 Essential (primary) hypertension: Secondary | ICD-10-CM | POA: Diagnosis not present

## 2016-10-13 DIAGNOSIS — I10 Essential (primary) hypertension: Secondary | ICD-10-CM | POA: Diagnosis not present

## 2016-10-13 DIAGNOSIS — E784 Other hyperlipidemia: Secondary | ICD-10-CM | POA: Diagnosis not present

## 2016-10-13 DIAGNOSIS — I638 Other cerebral infarction: Secondary | ICD-10-CM | POA: Diagnosis not present

## 2016-10-13 DIAGNOSIS — Z131 Encounter for screening for diabetes mellitus: Secondary | ICD-10-CM | POA: Diagnosis not present

## 2016-10-13 DIAGNOSIS — Z0000 Encounter for general adult medical examination without abnormal findings: Secondary | ICD-10-CM | POA: Diagnosis not present

## 2016-10-13 DIAGNOSIS — J3089 Other allergic rhinitis: Secondary | ICD-10-CM | POA: Diagnosis not present

## 2016-10-13 DIAGNOSIS — Z1389 Encounter for screening for other disorder: Secondary | ICD-10-CM | POA: Diagnosis not present

## 2016-11-20 DIAGNOSIS — E784 Other hyperlipidemia: Secondary | ICD-10-CM | POA: Diagnosis not present

## 2016-11-20 DIAGNOSIS — I10 Essential (primary) hypertension: Secondary | ICD-10-CM | POA: Diagnosis not present

## 2016-11-25 DIAGNOSIS — N3942 Incontinence without sensory awareness: Secondary | ICD-10-CM | POA: Diagnosis not present

## 2016-11-25 DIAGNOSIS — N393 Stress incontinence (female) (male): Secondary | ICD-10-CM | POA: Diagnosis not present

## 2016-11-25 DIAGNOSIS — R3912 Poor urinary stream: Secondary | ICD-10-CM | POA: Diagnosis not present

## 2016-11-25 DIAGNOSIS — Z8546 Personal history of malignant neoplasm of prostate: Secondary | ICD-10-CM | POA: Diagnosis not present

## 2016-12-22 DIAGNOSIS — J3089 Other allergic rhinitis: Secondary | ICD-10-CM | POA: Diagnosis not present

## 2016-12-22 DIAGNOSIS — Z131 Encounter for screening for diabetes mellitus: Secondary | ICD-10-CM | POA: Diagnosis not present

## 2016-12-22 DIAGNOSIS — I10 Essential (primary) hypertension: Secondary | ICD-10-CM | POA: Diagnosis not present

## 2016-12-22 DIAGNOSIS — E784 Other hyperlipidemia: Secondary | ICD-10-CM | POA: Diagnosis not present

## 2016-12-22 DIAGNOSIS — N400 Enlarged prostate without lower urinary tract symptoms: Secondary | ICD-10-CM | POA: Diagnosis not present

## 2016-12-22 DIAGNOSIS — I638 Other cerebral infarction: Secondary | ICD-10-CM | POA: Diagnosis not present

## 2016-12-25 DIAGNOSIS — E784 Other hyperlipidemia: Secondary | ICD-10-CM | POA: Diagnosis not present

## 2016-12-25 DIAGNOSIS — I638 Other cerebral infarction: Secondary | ICD-10-CM | POA: Diagnosis not present

## 2016-12-25 DIAGNOSIS — N400 Enlarged prostate without lower urinary tract symptoms: Secondary | ICD-10-CM | POA: Diagnosis not present

## 2016-12-25 DIAGNOSIS — I10 Essential (primary) hypertension: Secondary | ICD-10-CM | POA: Diagnosis not present

## 2017-01-13 DIAGNOSIS — K51511 Left sided colitis with rectal bleeding: Secondary | ICD-10-CM | POA: Diagnosis not present

## 2017-01-15 DIAGNOSIS — K625 Hemorrhage of anus and rectum: Secondary | ICD-10-CM | POA: Diagnosis not present

## 2017-01-19 DIAGNOSIS — Z7982 Long term (current) use of aspirin: Secondary | ICD-10-CM | POA: Diagnosis not present

## 2017-01-19 DIAGNOSIS — Z809 Family history of malignant neoplasm, unspecified: Secondary | ICD-10-CM | POA: Diagnosis not present

## 2017-01-19 DIAGNOSIS — Z9049 Acquired absence of other specified parts of digestive tract: Secondary | ICD-10-CM | POA: Diagnosis not present

## 2017-01-19 DIAGNOSIS — K625 Hemorrhage of anus and rectum: Secondary | ICD-10-CM | POA: Diagnosis not present

## 2017-01-19 DIAGNOSIS — D122 Benign neoplasm of ascending colon: Secondary | ICD-10-CM | POA: Diagnosis not present

## 2017-01-19 DIAGNOSIS — Z79899 Other long term (current) drug therapy: Secondary | ICD-10-CM | POA: Diagnosis not present

## 2017-01-28 DIAGNOSIS — R972 Elevated prostate specific antigen [PSA]: Secondary | ICD-10-CM | POA: Diagnosis not present

## 2017-01-28 DIAGNOSIS — Z8546 Personal history of malignant neoplasm of prostate: Secondary | ICD-10-CM | POA: Diagnosis not present

## 2017-02-05 DIAGNOSIS — D122 Benign neoplasm of ascending colon: Secondary | ICD-10-CM | POA: Diagnosis not present

## 2017-03-12 DIAGNOSIS — R310 Gross hematuria: Secondary | ICD-10-CM | POA: Diagnosis not present

## 2017-03-12 DIAGNOSIS — N393 Stress incontinence (female) (male): Secondary | ICD-10-CM | POA: Diagnosis not present

## 2017-03-12 DIAGNOSIS — R972 Elevated prostate specific antigen [PSA]: Secondary | ICD-10-CM | POA: Diagnosis not present

## 2017-03-31 DIAGNOSIS — I10 Essential (primary) hypertension: Secondary | ICD-10-CM | POA: Diagnosis not present

## 2017-03-31 DIAGNOSIS — E784 Other hyperlipidemia: Secondary | ICD-10-CM | POA: Diagnosis not present

## 2017-04-07 DIAGNOSIS — I10 Essential (primary) hypertension: Secondary | ICD-10-CM | POA: Diagnosis not present

## 2017-04-07 DIAGNOSIS — E784 Other hyperlipidemia: Secondary | ICD-10-CM | POA: Diagnosis not present

## 2017-04-07 DIAGNOSIS — N400 Enlarged prostate without lower urinary tract symptoms: Secondary | ICD-10-CM | POA: Diagnosis not present

## 2017-04-12 DIAGNOSIS — E7800 Pure hypercholesterolemia, unspecified: Secondary | ICD-10-CM | POA: Diagnosis not present

## 2017-04-12 DIAGNOSIS — Z7982 Long term (current) use of aspirin: Secondary | ICD-10-CM | POA: Diagnosis not present

## 2017-04-12 DIAGNOSIS — Z79899 Other long term (current) drug therapy: Secondary | ICD-10-CM | POA: Diagnosis not present

## 2017-04-12 DIAGNOSIS — I4510 Unspecified right bundle-branch block: Secondary | ICD-10-CM | POA: Diagnosis not present

## 2017-04-12 DIAGNOSIS — Z8546 Personal history of malignant neoplasm of prostate: Secondary | ICD-10-CM | POA: Diagnosis not present

## 2017-04-12 DIAGNOSIS — J45909 Unspecified asthma, uncomplicated: Secondary | ICD-10-CM | POA: Diagnosis not present

## 2017-04-12 DIAGNOSIS — Z87891 Personal history of nicotine dependence: Secondary | ICD-10-CM | POA: Diagnosis not present

## 2017-04-12 DIAGNOSIS — R404 Transient alteration of awareness: Secondary | ICD-10-CM | POA: Diagnosis not present

## 2017-04-12 DIAGNOSIS — R4182 Altered mental status, unspecified: Secondary | ICD-10-CM | POA: Diagnosis not present

## 2017-04-12 DIAGNOSIS — J9811 Atelectasis: Secondary | ICD-10-CM | POA: Diagnosis not present

## 2017-04-12 DIAGNOSIS — R531 Weakness: Secondary | ICD-10-CM | POA: Diagnosis not present

## 2017-04-12 DIAGNOSIS — R42 Dizziness and giddiness: Secondary | ICD-10-CM | POA: Diagnosis not present

## 2017-04-21 DIAGNOSIS — N400 Enlarged prostate without lower urinary tract symptoms: Secondary | ICD-10-CM | POA: Diagnosis not present

## 2017-04-21 DIAGNOSIS — E784 Other hyperlipidemia: Secondary | ICD-10-CM | POA: Diagnosis not present

## 2017-04-21 DIAGNOSIS — I10 Essential (primary) hypertension: Secondary | ICD-10-CM | POA: Diagnosis not present

## 2017-04-21 DIAGNOSIS — F411 Generalized anxiety disorder: Secondary | ICD-10-CM | POA: Diagnosis not present

## 2017-06-02 DIAGNOSIS — M6281 Muscle weakness (generalized): Secondary | ICD-10-CM | POA: Diagnosis not present

## 2017-06-02 DIAGNOSIS — R5383 Other fatigue: Secondary | ICD-10-CM | POA: Diagnosis not present

## 2017-07-27 DIAGNOSIS — I10 Essential (primary) hypertension: Secondary | ICD-10-CM | POA: Diagnosis not present

## 2017-07-27 DIAGNOSIS — R5383 Other fatigue: Secondary | ICD-10-CM | POA: Diagnosis not present

## 2017-07-27 DIAGNOSIS — F411 Generalized anxiety disorder: Secondary | ICD-10-CM | POA: Diagnosis not present

## 2017-07-27 DIAGNOSIS — R1031 Right lower quadrant pain: Secondary | ICD-10-CM | POA: Diagnosis not present

## 2017-07-27 DIAGNOSIS — M6281 Muscle weakness (generalized): Secondary | ICD-10-CM | POA: Diagnosis not present

## 2017-07-27 DIAGNOSIS — R10823 Right lower quadrant rebound abdominal tenderness: Secondary | ICD-10-CM | POA: Diagnosis not present

## 2017-07-27 DIAGNOSIS — N400 Enlarged prostate without lower urinary tract symptoms: Secondary | ICD-10-CM | POA: Diagnosis not present

## 2017-08-19 DIAGNOSIS — Z23 Encounter for immunization: Secondary | ICD-10-CM | POA: Diagnosis not present

## 2017-08-21 DIAGNOSIS — N400 Enlarged prostate without lower urinary tract symptoms: Secondary | ICD-10-CM | POA: Diagnosis not present

## 2017-08-21 DIAGNOSIS — H6121 Impacted cerumen, right ear: Secondary | ICD-10-CM | POA: Diagnosis not present

## 2017-10-02 DIAGNOSIS — S40011A Contusion of right shoulder, initial encounter: Secondary | ICD-10-CM | POA: Diagnosis not present

## 2017-10-02 DIAGNOSIS — Z6827 Body mass index (BMI) 27.0-27.9, adult: Secondary | ICD-10-CM | POA: Diagnosis not present

## 2017-10-02 DIAGNOSIS — S40021A Contusion of right upper arm, initial encounter: Secondary | ICD-10-CM | POA: Diagnosis not present

## 2017-10-21 DIAGNOSIS — R351 Nocturia: Secondary | ICD-10-CM | POA: Diagnosis not present

## 2017-10-21 DIAGNOSIS — N393 Stress incontinence (female) (male): Secondary | ICD-10-CM | POA: Diagnosis not present

## 2017-10-21 DIAGNOSIS — R350 Frequency of micturition: Secondary | ICD-10-CM | POA: Diagnosis not present

## 2017-10-26 DIAGNOSIS — E7849 Other hyperlipidemia: Secondary | ICD-10-CM | POA: Diagnosis not present

## 2017-10-26 DIAGNOSIS — N400 Enlarged prostate without lower urinary tract symptoms: Secondary | ICD-10-CM | POA: Diagnosis not present

## 2017-10-26 DIAGNOSIS — I10 Essential (primary) hypertension: Secondary | ICD-10-CM | POA: Diagnosis not present

## 2017-11-17 DIAGNOSIS — F329 Major depressive disorder, single episode, unspecified: Secondary | ICD-10-CM | POA: Diagnosis not present

## 2017-11-17 DIAGNOSIS — J441 Chronic obstructive pulmonary disease with (acute) exacerbation: Secondary | ICD-10-CM | POA: Diagnosis not present

## 2017-11-17 DIAGNOSIS — E7849 Other hyperlipidemia: Secondary | ICD-10-CM | POA: Diagnosis not present

## 2017-11-17 DIAGNOSIS — I10 Essential (primary) hypertension: Secondary | ICD-10-CM | POA: Diagnosis not present

## 2017-11-17 DIAGNOSIS — Z1389 Encounter for screening for other disorder: Secondary | ICD-10-CM | POA: Diagnosis not present

## 2017-11-17 DIAGNOSIS — N400 Enlarged prostate without lower urinary tract symptoms: Secondary | ICD-10-CM | POA: Diagnosis not present

## 2017-11-17 DIAGNOSIS — Z0000 Encounter for general adult medical examination without abnormal findings: Secondary | ICD-10-CM | POA: Diagnosis not present

## 2017-11-25 DIAGNOSIS — R339 Retention of urine, unspecified: Secondary | ICD-10-CM | POA: Diagnosis not present

## 2017-11-25 DIAGNOSIS — R1030 Lower abdominal pain, unspecified: Secondary | ICD-10-CM | POA: Diagnosis not present

## 2017-11-25 DIAGNOSIS — Z7902 Long term (current) use of antithrombotics/antiplatelets: Secondary | ICD-10-CM | POA: Diagnosis not present

## 2017-11-25 DIAGNOSIS — Z79899 Other long term (current) drug therapy: Secondary | ICD-10-CM | POA: Diagnosis not present

## 2017-11-25 DIAGNOSIS — E785 Hyperlipidemia, unspecified: Secondary | ICD-10-CM | POA: Diagnosis not present

## 2017-11-25 DIAGNOSIS — Z808 Family history of malignant neoplasm of other organs or systems: Secondary | ICD-10-CM | POA: Diagnosis not present

## 2017-11-25 DIAGNOSIS — Z883 Allergy status to other anti-infective agents status: Secondary | ICD-10-CM | POA: Diagnosis not present

## 2017-11-25 DIAGNOSIS — E7849 Other hyperlipidemia: Secondary | ICD-10-CM | POA: Diagnosis not present

## 2017-11-25 DIAGNOSIS — I739 Peripheral vascular disease, unspecified: Secondary | ICD-10-CM | POA: Diagnosis not present

## 2017-11-25 DIAGNOSIS — Z881 Allergy status to other antibiotic agents status: Secondary | ICD-10-CM | POA: Diagnosis not present

## 2017-11-25 DIAGNOSIS — R0789 Other chest pain: Secondary | ICD-10-CM | POA: Diagnosis not present

## 2017-11-25 DIAGNOSIS — F329 Major depressive disorder, single episode, unspecified: Secondary | ICD-10-CM | POA: Diagnosis not present

## 2017-11-25 DIAGNOSIS — F418 Other specified anxiety disorders: Secondary | ICD-10-CM | POA: Diagnosis not present

## 2017-11-25 DIAGNOSIS — R079 Chest pain, unspecified: Secondary | ICD-10-CM | POA: Diagnosis not present

## 2017-11-25 DIAGNOSIS — Z7982 Long term (current) use of aspirin: Secondary | ICD-10-CM | POA: Diagnosis not present

## 2017-11-26 DIAGNOSIS — F329 Major depressive disorder, single episode, unspecified: Secondary | ICD-10-CM | POA: Diagnosis not present

## 2017-11-26 DIAGNOSIS — E7849 Other hyperlipidemia: Secondary | ICD-10-CM | POA: Diagnosis not present

## 2017-11-26 DIAGNOSIS — E785 Hyperlipidemia, unspecified: Secondary | ICD-10-CM | POA: Diagnosis not present

## 2017-11-26 DIAGNOSIS — R0789 Other chest pain: Secondary | ICD-10-CM | POA: Diagnosis not present

## 2017-11-26 DIAGNOSIS — F418 Other specified anxiety disorders: Secondary | ICD-10-CM | POA: Diagnosis not present

## 2017-11-26 DIAGNOSIS — R1030 Lower abdominal pain, unspecified: Secondary | ICD-10-CM | POA: Diagnosis not present

## 2017-11-26 DIAGNOSIS — I739 Peripheral vascular disease, unspecified: Secondary | ICD-10-CM | POA: Diagnosis not present

## 2017-11-26 DIAGNOSIS — R079 Chest pain, unspecified: Secondary | ICD-10-CM | POA: Diagnosis not present

## 2017-11-27 ENCOUNTER — Emergency Department (HOSPITAL_COMMUNITY)
Admission: EM | Admit: 2017-11-27 | Discharge: 2017-11-27 | Disposition: A | Discharge status: Home / Self Care | Payer: Medicare Other | Attending: Emergency Medicine | Admitting: Emergency Medicine

## 2017-11-27 DIAGNOSIS — I10 Essential (primary) hypertension: Secondary | ICD-10-CM | POA: Diagnosis not present

## 2017-11-27 DIAGNOSIS — Z87891 Personal history of nicotine dependence: Secondary | ICD-10-CM | POA: Diagnosis not present

## 2017-11-27 DIAGNOSIS — R338 Other retention of urine: Secondary | ICD-10-CM | POA: Diagnosis not present

## 2017-11-27 DIAGNOSIS — Z7902 Long term (current) use of antithrombotics/antiplatelets: Secondary | ICD-10-CM | POA: Insufficient documentation

## 2017-11-27 DIAGNOSIS — E7800 Pure hypercholesterolemia, unspecified: Secondary | ICD-10-CM | POA: Diagnosis not present

## 2017-11-27 DIAGNOSIS — Z8546 Personal history of malignant neoplasm of prostate: Secondary | ICD-10-CM | POA: Diagnosis not present

## 2017-11-27 DIAGNOSIS — I2510 Atherosclerotic heart disease of native coronary artery without angina pectoris: Secondary | ICD-10-CM | POA: Diagnosis not present

## 2017-11-27 DIAGNOSIS — Z7982 Long term (current) use of aspirin: Secondary | ICD-10-CM | POA: Diagnosis not present

## 2017-11-27 DIAGNOSIS — R52 Pain, unspecified: Secondary | ICD-10-CM | POA: Diagnosis not present

## 2017-11-27 DIAGNOSIS — S3710XA Unspecified injury of ureter, initial encounter: Secondary | ICD-10-CM | POA: Diagnosis not present

## 2017-11-27 DIAGNOSIS — Z79899 Other long term (current) drug therapy: Secondary | ICD-10-CM | POA: Insufficient documentation

## 2017-11-27 DIAGNOSIS — Z9981 Dependence on supplemental oxygen: Secondary | ICD-10-CM | POA: Diagnosis not present

## 2017-11-27 DIAGNOSIS — R1032 Left lower quadrant pain: Secondary | ICD-10-CM | POA: Diagnosis not present

## 2017-11-27 DIAGNOSIS — R109 Unspecified abdominal pain: Secondary | ICD-10-CM | POA: Diagnosis not present

## 2017-11-27 DIAGNOSIS — R030 Elevated blood-pressure reading, without diagnosis of hypertension: Secondary | ICD-10-CM | POA: Diagnosis not present

## 2017-11-27 DIAGNOSIS — R339 Retention of urine, unspecified: Secondary | ICD-10-CM | POA: Diagnosis not present

## 2017-11-27 LAB — URINALYSIS, ROUTINE W REFLEX MICROSCOPIC
Bilirubin Urine: NEGATIVE
Glucose, UA: 50 mg/dL — AB
Ketones, ur: 20 mg/dL — AB
Leukocytes, UA: NEGATIVE
Nitrite: POSITIVE — AB
PROTEIN: 100 mg/dL — AB
SPECIFIC GRAVITY, URINE: 1.015 (ref 1.005–1.030)
Squamous Epithelial / LPF: NONE SEEN
pH: 6 (ref 5.0–8.0)

## 2017-11-27 MED ORDER — LIDOCAINE HCL 2 % EX GEL: 1.0000 "application " | Freq: Once | CUTANEOUS | Status: DC | PRN

## 2017-11-27 MED ORDER — FENTANYL CITRATE (PF) 100 MCG/2ML IJ SOLN
50.0000 ug | Freq: Once | INTRAMUSCULAR | Status: AC
  Administered 2017-11-27: 50 ug via INTRAVENOUS
  Filled 2017-11-27: qty 2

## 2017-11-27 MED ORDER — DOXYCYCLINE HYCLATE 100 MG PO CAPS: 100.0000 mg | ORAL_CAPSULE | Freq: Two times a day (BID) | ORAL | 0 refills | Status: AC

## 2017-11-27 MED ORDER — DOXYCYCLINE HYCLATE 100 MG PO TABS
100.0000 mg | ORAL_TABLET | Freq: Once | ORAL | Status: AC
  Administered 2017-11-27: 100 mg via ORAL
  Filled 2017-11-27: qty 1

## 2017-11-27 NOTE — ED Provider Notes (Signed)
[facility] DEPT Provider Note   CSN: [id number] Arrival date & time: 11/27/17  0348     History   Chief Complaint Chief Complaint  Patient presents with  . Urinary Retention    HPI Warren [name] is a 84 y.o. male.  HPI  84 year old male with a history of an artificial urethral sphincter presents with urinary retention.  He was transferred here for urology consultation from [facility].  He was seen in the ED but a catheter was unable to be passed.  A 16 French was attempted by the nurse but was unable to return urine and when removed there was blood coming from his penis.  The EDP consulted Dr. Karsten Ro who advised patient to be transferred here given no urology coverage.  Patient is complaining of lower abdominal pain and fullness as well as penile pain.  He was recently admitted for similar symptoms but that information is not available in the chart at this time.  Was given morphine in the ER and 50 mcg of fentanyl by EMS.  Past Medical History:  Diagnosis Date  . CAD (coronary artery disease)    moderate by catheterization,11/08...essentially unchanged by repeat study,10/11  . Carotid artery disease     less than 50% stenosis bilaterally March 2013   . Claudication     ABIs within ABIs within normal limits, 40% left iliac disease by catheterization   . Colon polyps   . Depression   . Dizzy   . Edema extremities     secondary to severe varicose veins. Nonpitting edema and dermatitis.   . Emphysema   . Glaucoma   . Hyperlipidemia   . IBS (irritable bowel syndrome)   . Mitral regurgitation    mild  . Mixed dyslipidemia   . Peripheral edema   . Pneumonia   . Prostate cancer    s/p radiation treatment/s/p TURP  . Shortness of breath    Emphysema  . Thyroid nodule     posterior and inferior to the right lobepossibly outside of the gland secondary to parathyroid adenoma in addition multiple bilateral thyroid nodules     Patient  Active Problem List   Diagnosis Date Noted  . Emphysema   . Thyroid nodule   . Carotid artery disease ([place])   . CAD (coronary artery disease)   . Edema extremities   . Claudication ([place]) 07/30/2011  . DYSLIPIDEMIA 07/03/2010  . HYPERTENSION, BENIGN 07/03/2010  . PERIPHERAL EDEMA 07/03/2010  . SHORTNESS OF BREATH 07/03/2010  . PRECORDIAL PAIN 07/03/2010    Past Surgical History:  Procedure Laterality Date  . CARDIAC CATHETERIZATION  2008  . CHOLECYSTECTOMY    . INCONTINENCE SURGERY     ring inserted  . TONSILLECTOMY    . TRANSURETHRAL RESECTION OF PROSTATE     has stress urinary incontinence       Home Medications    Prior to Admission medications   Medication Sig Start Date End Date Taking? Authorizing Provider  Cholecalciferol (VITAMIN D-3) 1000 UNITS CAPS Take 1 capsule by mouth daily.    [provider]  citalopram (CELEXA) 20 MG tablet Take 20 mg by mouth daily.      [provider]  clopidogrel (PLAVIX) 75 MG tablet TAKE 1 TABLET ONCE DAILY 06/28/12   de Stanford Scotland, MD  doxycycline (VIBRAMYCIN) 100 MG capsule Take 1 capsule (100 mg total) by mouth 2 (two) times daily for 5 days. 11/27/17 12/02/17  Sherwood Gambler, MD  Javier Docker Oil 300  MG CAPS Take 1 capsule by mouth daily.    [provider]  LORazepam (ATIVAN) 1 MG tablet Take 1 mg by mouth 2 (two) times daily.      [provider]  nitroGLYCERIN (NITROSTAT) 0.4 MG SL tablet Place 0.4 mg under the tongue every 5 (five) minutes as needed. May repeat for up to 3 doses.     [provider]  pravastatin (PRAVACHOL) 10 MG tablet Take 1 tablet (10 mg total) by mouth daily. (HOLD X 2 WEEKS) 01/27/12   de Stanford Scotland, MD  atorvastatin (LIPITOR) 40 MG tablet Take 40 mg by mouth at bedtime.    01/27/12  [provider]  furosemide (LASIX) 20 MG tablet Take 1 tablet (20 mg total) by mouth daily. 07/30/11 01/27/12  de Stanford Scotland, MD    Family History Family History  Problem  Relation Age of Onset  . Heart attack Sister        in her 57's  . Other Mother        lived to be 63  . Pancreatic cancer Father   . Colon cancer Neg Hx   . Crohn's disease Son     Social History Social History   Tobacco Use  . Smoking status: Former Smoker    Packs/day: 1.00    Years: 40.00    Pack years: 40.00    Types: Cigarettes    Last attempt to quit: 11/04/1983    Years since quitting: 34.0  . Smokeless tobacco: Never Used  Substance Use Topics  . Alcohol use: No  . Drug use: No     Allergies   Aspirin   Review of Systems Review of Systems  Constitutional: Negative for fever.  Gastrointestinal: Positive for abdominal distention and abdominal pain.  Genitourinary: Positive for difficulty urinating and penile pain.  All other systems reviewed and are negative.    Physical Exam Updated Vital Signs BP (!) 171/122 (BP Location: Right Arm)   Pulse (!) 104   Temp 98.3 F (36.8 C) (Oral)   Resp (!) 22   SpO2 94%   Physical Exam  Constitutional: He is oriented to person, place, and time. He appears well-developed and well-nourished.  HENT:  Head: Normocephalic and atraumatic.  Right Ear: External ear normal.  Left Ear: External ear normal.  Nose: Nose normal.  Eyes: Right eye exhibits no discharge. Left eye exhibits no discharge.  Neck: Neck supple.  Cardiovascular: Normal rate, regular rhythm and normal heart sounds.  Pulmonary/Chest: Effort normal and breath sounds normal.  Abdominal: Soft. He exhibits distension. There is tenderness in the suprapubic area.  Genitourinary: No penile tenderness.  Genitourinary Comments: Dried blood around penis but no current bleeding from glans No scrotal tenderness.  Sphincter bulb easily palpable  Musculoskeletal: He exhibits no edema.  Neurological: He is alert and oriented to person, place, and time.  Skin: Skin is warm and dry.  Nursing note and vitals reviewed.    ED Treatments / Results  Labs (all labs  ordered are listed, but only abnormal results are displayed) Labs Reviewed  URINE CULTURE  URINALYSIS, ROUTINE W REFLEX MICROSCOPIC    EKG  EKG Interpretation None       Radiology No results found.  Procedures Procedures (including critical care time)  Medications Ordered in ED Medications  lidocaine (XYLOCAINE) 2 % jelly 1 application (not administered)  doxycycline (VIBRA-TABS) tablet 100 mg (not administered)  fentaNYL (SUBLIMAZE) injection 50 mcg (50 mcg Intravenous Given 11/27/17 0438)  Initial Impression / Assessment and Plan / ED Course  I have reviewed the triage vital signs and the nursing notes.  Pertinent labs & imaging results that were available during my care of the patient were reviewed by me and considered in my medical decision making (see chart for details).     I attempted to decompress the urethral sphincter as instructed by Dr. Karsten Ro with the pump but the patient is still unable to void.  Thus Dr. Karsten Ro has placed a Foley catheter with resolution of the patient's abdominal and penile pain.  Asks for the patient to be put on doxycycline as he just finished a course of Bactrim.  Otherwise the patient is well-appearing and appears stable for discharge home.  5-day course of doxycycline and follow-up with his urologist, Dr. [name], next week.  Return precautions.  Final Clinical Impressions(s) / ED Diagnoses   Final diagnoses:  Acute urinary retention    ED Discharge Orders        Ordered    doxycycline (VIBRAMYCIN) 100 MG capsule  2 times daily     11/27/17 0516       Sherwood Gambler, MD 11/27/17 [phone]

## 2017-11-27 NOTE — ED Triage Notes (Signed)
BIB [facility] from [facility] w/ c/o urinary retention, EMS reports x2 attempts to place foley catheter unsuccessfully. Pt reports inability to void x5 days. Pt given 4mg  Morphine IV at [facility] and 87mcg Fentanyl w/ EMS. Pt A+OX4, speaking in complete sentences, reporting 10/10 pain.   EMS Vitals BP 185/111 P 105 RR 25 SPO2 97% on 2L ETCO2 37

## 2017-11-27 NOTE — ED Notes (Signed)
Contact urology at [phone]

## 2017-11-27 NOTE — Consult Note (Signed)
Urology Consult  CC: Referring physician: Dr. Tyron Russell Reason for referral: Urinary retention  Procedure:  Flexible cystoscopy was performed after a full discussion of the procedure with the patient and his son.  I 1st deflated the sphincter and locked it in its open position.  I sterilely prepped the penis and then passed the 6 French flexible cystoscope under direct vision down the urethra.  There was a large false passage at the 6 o'clock position in the bulbar urethra.  There was some clot present.  I was unable to visualize any portion of exposed sphincter.  I was able to pass the scope anteriorly and gain entrance into the bladder.  I then passed a 0.038 in floppy-tipped guidewire through the cystoscope and into the bladder and left the guidewire in place.  I then removed the cystoscope.   I used a 16 gauge angiocath to pierce the tip of a 40 French Foley catheter and then passed the guidewire through this opening and down through the catheter.  Using Seldinger technique I passed the catheter over the guidewire and into the bladder without difficulty and then removed the guidewire.  The balloon was filled with 10 cc of sterile water and connected to closed system drainage.  Urine was obtained for culture.  The urine was slightly bloody but there were no clots.  Impression/Assessment:   Iatrogenic urethral trauma:  He unfortunately had multiple attempts at catheter placement with his sphincter inflated.  This resulted in a bulbar urethral false passage.  I have deactivated the sphincter and placed a small catheter into the bladder.  His son indicated that he just completed a course of sulfa antibiotics about 2 weeks ago however I recommended he be placed on antibiotics and then will follow up with Dr. [name] as an outpatient.   Plan:   1.  Foley catheter drainage.    2.  Culture  Urine.  3.  He will follow up with Dr. [name] as an outpatient. 4.   Empiric doxycycline.   History of present illness:  He was seen at [facility] where he was experiencing chest pain and had reported difficulty voiding.  Attempts were undertaken to pass a Foley catheter however the patient did not inform anyone that he had an artificial urinary sphincter.  Therefore the sphincter was not deactivated and multiple attempts at Foley catheter placement were undertaken but were unsuccessful.  This resulted in significant bleeding per urethra.  He continued to have an inability urinate and developed suprapubic discomfort.  He was then seen in the emergency room there and apparently his sphincter was deactivated with an attempt at placing catheters once again but with no success.  He was therefore transferred to [facility] as there were no urologist available at that hospital. The patient complained of suprapubic discomfort and his son was able to give me more meaningful history then the patient.  He apparently had urinated earlier in the day but only a very small amount.  He takes Plavix and aspirin.  He has a history of having a urinary sphincter placed in 2011 and had begun to experience incontinence.  Cystoscopy by Dr. [name] on 10/21/17 revealed normal penile bulbar urethra and he noted the device appeared to be functioning properly with no evidence of erosion.  Past Medical History:  Diagnosis Date  . CAD (coronary artery disease)    moderate by catheterization,11/08...essentially unchanged by repeat study,10/11  . Carotid artery disease     less than  50% stenosis bilaterally March 2013   . Claudication     ABIs within ABIs within normal limits, 40% left iliac disease by catheterization   . Colon polyps   . Depression   . Dizzy   . Edema extremities     secondary to severe varicose veins. Nonpitting edema and dermatitis.   . Emphysema   . Glaucoma   . Hyperlipidemia   . IBS (irritable bowel syndrome)   . Mitral regurgitation    mild  . Mixed dyslipidemia   . Peripheral  edema   . Pneumonia   . Prostate cancer    s/p radiation treatment/s/p TURP  . Shortness of breath    Emphysema  . Thyroid nodule     posterior and inferior to the right lobepossibly outside of the gland secondary to parathyroid adenoma in addition multiple bilateral thyroid nodules    Past Surgical History:  Procedure Laterality Date  . CARDIAC CATHETERIZATION  2008  . CHOLECYSTECTOMY    . INCONTINENCE SURGERY     ring inserted  . TONSILLECTOMY    . TRANSURETHRAL RESECTION OF PROSTATE     has stress urinary incontinence    Medications: Prior to Admission:  (Not in a hospital admission)  Allergies:  Allergies  Allergen Reactions  . Aspirin     REACTION: SPOTS    Family History  Problem Relation Age of Onset  . Heart attack Sister        in her 74's  . Other Mother        lived to be 49  . Pancreatic cancer Father   . Colon cancer Neg Hx   . Crohn's disease Son     Social History:  reports that he quit smoking about 34 years ago. His smoking use included cigarettes. He has a 40.00 pack-year smoking history. he has never used smokeless tobacco. He reports that he does not drink alcohol or use drugs.  Review of Systems (10 point): Pertinent items are noted in HPI. A comprehensive review of systems was negative except as noted above.  Physical Exam:  Vital signs in last 24 hours: Temp:  [98.3 F (36.8 C)] 98.3 F (36.8 C) (01/25 0359) Pulse Rate:  [104] 104 (01/25 0359) Resp:  [22] 22 (01/25 0359) BP: (171)/(122) 171/122 (01/25 0359) SpO2:  [94 %] 94 % (01/25 0359) General appearance: alert and appears stated age Head: Normocephalic, without obvious abnormality, atraumatic Eyes: conjunctivae/corneas clear. EOM's intact.  Oropharynx: moist mucous membranes Neck: supple, symmetrical, trachea midline Resp: normal respiratory effort Cardio: regular rate and rhythm Back: symmetric, no curvature. ROM normal. No CVA tenderness. GI: soft, non-tender; bowel sounds  normal; no masses,  no organomegaly Male genitalia: penis: normal male phallus with no lesions or discharge.Testes: bilaterally descended with no masses or tenderness. no hernias  The pump to his sphincter was noted to be in the right hemiscrotum.  It was minimally tender to palpation.  There was no evidence of infection. Extremities: extremities normal, atraumatic, no cyanosis or edema Skin: Skin color normal. No visible rashes or lesions Neurologic: Grossly normal  Laboratory Data:  No results for input(s): WBC, HGB, HCT in the last 72 hours. BMET No results for input(s): NA, K, CL, CO2, GLUCOSE, BUN, CREATININE, CALCIUM in the last 72 hours. No results for input(s): LABPT, INR in the last 72 hours. No results for input(s): LABURIN in the last 72 hours. No results found for this or any previous visit. Creatinine: No results for input(s): CREATININE in the  last 168 hours.  Imaging: No results found.     Houa Nie C 11/27/2017, 5:21 AM

## 2017-11-27 NOTE — ED Notes (Signed)
Bed: WA21 Expected date:  Expected time:  Means of arrival:  Comments: Patient tfrom [place]-needs foley

## 2017-11-28 LAB — URINE CULTURE
CULTURE: NO GROWTH
Special Requests: NORMAL

## 2017-12-06 DIAGNOSIS — E7800 Pure hypercholesterolemia, unspecified: Secondary | ICD-10-CM | POA: Diagnosis not present

## 2017-12-06 DIAGNOSIS — Z7982 Long term (current) use of aspirin: Secondary | ICD-10-CM | POA: Diagnosis not present

## 2017-12-06 DIAGNOSIS — Z87891 Personal history of nicotine dependence: Secondary | ICD-10-CM | POA: Diagnosis not present

## 2017-12-06 DIAGNOSIS — Z8546 Personal history of malignant neoplasm of prostate: Secondary | ICD-10-CM | POA: Diagnosis not present

## 2017-12-06 DIAGNOSIS — R339 Retention of urine, unspecified: Secondary | ICD-10-CM | POA: Diagnosis not present

## 2017-12-06 DIAGNOSIS — Z79899 Other long term (current) drug therapy: Secondary | ICD-10-CM | POA: Diagnosis not present

## 2017-12-06 DIAGNOSIS — T83498A Other mechanical complication of other prosthetic devices, implants and grafts of genital tract, initial encounter: Secondary | ICD-10-CM | POA: Diagnosis not present

## 2017-12-06 DIAGNOSIS — T82898A Other specified complication of vascular prosthetic devices, implants and grafts, initial encounter: Secondary | ICD-10-CM | POA: Diagnosis not present

## 2017-12-24 DIAGNOSIS — R351 Nocturia: Secondary | ICD-10-CM | POA: Diagnosis not present

## 2017-12-24 DIAGNOSIS — N393 Stress incontinence (female) (male): Secondary | ICD-10-CM | POA: Diagnosis not present

## 2018-01-06 DIAGNOSIS — I10 Essential (primary) hypertension: Secondary | ICD-10-CM | POA: Diagnosis not present

## 2018-01-06 DIAGNOSIS — E7849 Other hyperlipidemia: Secondary | ICD-10-CM | POA: Diagnosis not present

## 2018-01-07 DIAGNOSIS — M810 Age-related osteoporosis without current pathological fracture: Secondary | ICD-10-CM | POA: Diagnosis not present

## 2018-01-07 DIAGNOSIS — M8588 Other specified disorders of bone density and structure, other site: Secondary | ICD-10-CM | POA: Diagnosis not present

## 2018-01-21 DIAGNOSIS — N3942 Incontinence without sensory awareness: Secondary | ICD-10-CM | POA: Diagnosis not present

## 2018-01-21 DIAGNOSIS — N393 Stress incontinence (female) (male): Secondary | ICD-10-CM | POA: Diagnosis not present

## 2018-01-26 DIAGNOSIS — F980 Enuresis not due to a substance or known physiological condition: Secondary | ICD-10-CM | POA: Diagnosis not present

## 2018-02-09 DIAGNOSIS — F411 Generalized anxiety disorder: Secondary | ICD-10-CM | POA: Diagnosis not present

## 2018-02-09 DIAGNOSIS — F980 Enuresis not due to a substance or known physiological condition: Secondary | ICD-10-CM | POA: Diagnosis not present

## 2018-02-12 DIAGNOSIS — I10 Essential (primary) hypertension: Secondary | ICD-10-CM | POA: Diagnosis not present

## 2018-02-12 DIAGNOSIS — E7849 Other hyperlipidemia: Secondary | ICD-10-CM | POA: Diagnosis not present

## 2018-02-18 DIAGNOSIS — I6789 Other cerebrovascular disease: Secondary | ICD-10-CM | POA: Diagnosis not present

## 2018-02-18 DIAGNOSIS — F411 Generalized anxiety disorder: Secondary | ICD-10-CM | POA: Diagnosis not present

## 2018-02-18 DIAGNOSIS — E782 Mixed hyperlipidemia: Secondary | ICD-10-CM | POA: Diagnosis not present

## 2018-02-18 DIAGNOSIS — J441 Chronic obstructive pulmonary disease with (acute) exacerbation: Secondary | ICD-10-CM | POA: Diagnosis not present

## 2018-02-18 DIAGNOSIS — F980 Enuresis not due to a substance or known physiological condition: Secondary | ICD-10-CM | POA: Diagnosis not present

## 2018-02-19 DIAGNOSIS — I700 Atherosclerosis of aorta: Secondary | ICD-10-CM | POA: Diagnosis present

## 2018-02-19 DIAGNOSIS — E7849 Other hyperlipidemia: Secondary | ICD-10-CM | POA: Diagnosis not present

## 2018-02-19 DIAGNOSIS — Z8546 Personal history of malignant neoplasm of prostate: Secondary | ICD-10-CM | POA: Diagnosis not present

## 2018-02-19 DIAGNOSIS — E785 Hyperlipidemia, unspecified: Secondary | ICD-10-CM | POA: Diagnosis not present

## 2018-02-19 DIAGNOSIS — F411 Generalized anxiety disorder: Secondary | ICD-10-CM | POA: Diagnosis not present

## 2018-02-19 DIAGNOSIS — N12 Tubulo-interstitial nephritis, not specified as acute or chronic: Secondary | ICD-10-CM | POA: Diagnosis not present

## 2018-02-19 DIAGNOSIS — F329 Major depressive disorder, single episode, unspecified: Secondary | ICD-10-CM | POA: Diagnosis not present

## 2018-02-19 DIAGNOSIS — R531 Weakness: Secondary | ICD-10-CM | POA: Diagnosis present

## 2018-02-19 DIAGNOSIS — I10 Essential (primary) hypertension: Secondary | ICD-10-CM | POA: Diagnosis present

## 2018-02-19 DIAGNOSIS — T83598A Infection and inflammatory reaction due to other prosthetic device, implant and graft in urinary system, initial encounter: Secondary | ICD-10-CM | POA: Diagnosis present

## 2018-02-19 DIAGNOSIS — I739 Peripheral vascular disease, unspecified: Secondary | ICD-10-CM | POA: Diagnosis not present

## 2018-02-19 DIAGNOSIS — K5790 Diverticulosis of intestine, part unspecified, without perforation or abscess without bleeding: Secondary | ICD-10-CM | POA: Diagnosis not present

## 2018-02-19 DIAGNOSIS — Z79899 Other long term (current) drug therapy: Secondary | ICD-10-CM | POA: Diagnosis not present

## 2018-02-19 DIAGNOSIS — R110 Nausea: Secondary | ICD-10-CM | POA: Diagnosis not present

## 2018-02-19 DIAGNOSIS — N10 Acute tubulo-interstitial nephritis: Secondary | ICD-10-CM | POA: Diagnosis present

## 2018-02-19 DIAGNOSIS — I7389 Other specified peripheral vascular diseases: Secondary | ICD-10-CM | POA: Diagnosis not present

## 2018-02-19 DIAGNOSIS — R112 Nausea with vomiting, unspecified: Secondary | ICD-10-CM | POA: Diagnosis not present

## 2018-02-26 ENCOUNTER — Other Ambulatory Visit: Payer: Self-pay

## 2018-02-26 ENCOUNTER — Encounter (HOSPITAL_COMMUNITY): Payer: Self-pay | Admitting: *Deleted

## 2018-02-26 ENCOUNTER — Inpatient Hospital Stay (HOSPITAL_COMMUNITY)
Admission: AD | Admit: 2018-02-26 | Discharge: 2018-03-03 | DRG: 671 | Disposition: A | Discharge status: Skilled Nursing Facility | Payer: Medicare Other | Source: Other Acute Inpatient Hospital | Attending: Internal Medicine | Admitting: Internal Medicine

## 2018-02-26 DIAGNOSIS — R05 Cough: Secondary | ICD-10-CM | POA: Diagnosis not present

## 2018-02-26 DIAGNOSIS — Y732 Prosthetic and other implants, materials and accessory gastroenterology and urology devices associated with adverse incidents: Secondary | ICD-10-CM | POA: Diagnosis present

## 2018-02-26 DIAGNOSIS — R2689 Other abnormalities of gait and mobility: Secondary | ICD-10-CM | POA: Diagnosis not present

## 2018-02-26 DIAGNOSIS — Z923 Personal history of irradiation: Secondary | ICD-10-CM | POA: Diagnosis not present

## 2018-02-26 DIAGNOSIS — R32 Unspecified urinary incontinence: Secondary | ICD-10-CM | POA: Diagnosis present

## 2018-02-26 DIAGNOSIS — I6523 Occlusion and stenosis of bilateral carotid arteries: Secondary | ICD-10-CM | POA: Diagnosis present

## 2018-02-26 DIAGNOSIS — Z743 Need for continuous supervision: Secondary | ICD-10-CM | POA: Diagnosis not present

## 2018-02-26 DIAGNOSIS — T8579XA Infection and inflammatory reaction due to other internal prosthetic devices, implants and grafts, initial encounter: Secondary | ICD-10-CM | POA: Diagnosis not present

## 2018-02-26 DIAGNOSIS — N10 Acute tubulo-interstitial nephritis: Secondary | ICD-10-CM | POA: Diagnosis not present

## 2018-02-26 DIAGNOSIS — Z886 Allergy status to analgesic agent status: Secondary | ICD-10-CM

## 2018-02-26 DIAGNOSIS — Z880 Allergy status to penicillin: Secondary | ICD-10-CM

## 2018-02-26 DIAGNOSIS — Z87891 Personal history of nicotine dependence: Secondary | ICD-10-CM | POA: Diagnosis not present

## 2018-02-26 DIAGNOSIS — T83591A Infection and inflammatory reaction due to implanted urinary sphincter, initial encounter: Principal | ICD-10-CM | POA: Diagnosis present

## 2018-02-26 DIAGNOSIS — T83598A Infection and inflammatory reaction due to other prosthetic device, implant and graft in urinary system, initial encounter: Secondary | ICD-10-CM | POA: Diagnosis not present

## 2018-02-26 DIAGNOSIS — I340 Nonrheumatic mitral (valve) insufficiency: Secondary | ICD-10-CM | POA: Diagnosis present

## 2018-02-26 DIAGNOSIS — E782 Mixed hyperlipidemia: Secondary | ICD-10-CM | POA: Diagnosis present

## 2018-02-26 DIAGNOSIS — F329 Major depressive disorder, single episode, unspecified: Secondary | ICD-10-CM | POA: Diagnosis not present

## 2018-02-26 DIAGNOSIS — Z9079 Acquired absence of other genital organ(s): Secondary | ICD-10-CM

## 2018-02-26 DIAGNOSIS — I2510 Atherosclerotic heart disease of native coronary artery without angina pectoris: Secondary | ICD-10-CM | POA: Diagnosis present

## 2018-02-26 DIAGNOSIS — B965 Pseudomonas (aeruginosa) (mallei) (pseudomallei) as the cause of diseases classified elsewhere: Secondary | ICD-10-CM | POA: Diagnosis present

## 2018-02-26 DIAGNOSIS — Y838 Other surgical procedures as the cause of abnormal reaction of the patient, or of later complication, without mention of misadventure at the time of the procedure: Secondary | ICD-10-CM | POA: Diagnosis present

## 2018-02-26 DIAGNOSIS — R319 Hematuria, unspecified: Secondary | ICD-10-CM | POA: Diagnosis not present

## 2018-02-26 DIAGNOSIS — Z91040 Latex allergy status: Secondary | ICD-10-CM | POA: Diagnosis not present

## 2018-02-26 DIAGNOSIS — E876 Hypokalemia: Secondary | ICD-10-CM | POA: Diagnosis present

## 2018-02-26 DIAGNOSIS — F419 Anxiety disorder, unspecified: Secondary | ICD-10-CM | POA: Diagnosis not present

## 2018-02-26 DIAGNOSIS — B961 Klebsiella pneumoniae [K. pneumoniae] as the cause of diseases classified elsewhere: Secondary | ICD-10-CM | POA: Diagnosis not present

## 2018-02-26 DIAGNOSIS — Z8546 Personal history of malignant neoplasm of prostate: Secondary | ICD-10-CM | POA: Diagnosis not present

## 2018-02-26 DIAGNOSIS — I10 Essential (primary) hypertension: Secondary | ICD-10-CM | POA: Diagnosis present

## 2018-02-26 DIAGNOSIS — G9340 Encephalopathy, unspecified: Secondary | ICD-10-CM

## 2018-02-26 DIAGNOSIS — I739 Peripheral vascular disease, unspecified: Secondary | ICD-10-CM | POA: Diagnosis present

## 2018-02-26 DIAGNOSIS — M6281 Muscle weakness (generalized): Secondary | ICD-10-CM | POA: Diagnosis not present

## 2018-02-26 DIAGNOSIS — R5381 Other malaise: Secondary | ICD-10-CM | POA: Diagnosis not present

## 2018-02-26 DIAGNOSIS — Z79899 Other long term (current) drug therapy: Secondary | ICD-10-CM

## 2018-02-26 DIAGNOSIS — J439 Emphysema, unspecified: Secondary | ICD-10-CM | POA: Diagnosis present

## 2018-02-26 DIAGNOSIS — H409 Unspecified glaucoma: Secondary | ICD-10-CM | POA: Diagnosis present

## 2018-02-26 DIAGNOSIS — I7389 Other specified peripheral vascular diseases: Secondary | ICD-10-CM | POA: Diagnosis not present

## 2018-02-26 DIAGNOSIS — A4152 Sepsis due to Pseudomonas: Secondary | ICD-10-CM | POA: Diagnosis present

## 2018-02-26 DIAGNOSIS — R2681 Unsteadiness on feet: Secondary | ICD-10-CM | POA: Diagnosis not present

## 2018-02-26 DIAGNOSIS — R279 Unspecified lack of coordination: Secondary | ICD-10-CM | POA: Diagnosis not present

## 2018-02-26 DIAGNOSIS — R488 Other symbolic dysfunctions: Secondary | ICD-10-CM | POA: Diagnosis not present

## 2018-02-26 DIAGNOSIS — Z466 Encounter for fitting and adjustment of urinary device: Secondary | ICD-10-CM | POA: Diagnosis not present

## 2018-02-26 DIAGNOSIS — E042 Nontoxic multinodular goiter: Secondary | ICD-10-CM | POA: Diagnosis present

## 2018-02-26 DIAGNOSIS — N390 Urinary tract infection, site not specified: Secondary | ICD-10-CM | POA: Diagnosis present

## 2018-02-26 DIAGNOSIS — T83591D Infection and inflammatory reaction due to implanted urinary sphincter, subsequent encounter: Secondary | ICD-10-CM | POA: Diagnosis not present

## 2018-02-26 DIAGNOSIS — E7849 Other hyperlipidemia: Secondary | ICD-10-CM | POA: Diagnosis not present

## 2018-02-26 DIAGNOSIS — Z7902 Long term (current) use of antithrombotics/antiplatelets: Secondary | ICD-10-CM

## 2018-02-26 LAB — CBC
HCT: 43 % (ref 39.0–52.0)
Hemoglobin: 14.5 g/dL (ref 13.0–17.0)
MCH: 31.3 pg (ref 26.0–34.0)
MCHC: 33.7 g/dL (ref 30.0–36.0)
MCV: 92.9 fL (ref 78.0–100.0)
Platelets: 228 K/uL (ref 150–400)
RBC: 4.63 MIL/uL (ref 4.22–5.81)
RDW: 13.7 % (ref 11.5–15.5)
WBC: 11.3 K/uL — ABNORMAL HIGH (ref 4.0–10.5)

## 2018-02-26 LAB — COMPREHENSIVE METABOLIC PANEL WITH GFR
ALT: 15 U/L — ABNORMAL LOW (ref 17–63)
AST: 17 U/L (ref 15–41)
Albumin: 3 g/dL — ABNORMAL LOW (ref 3.5–5.0)
Alkaline Phosphatase: 40 U/L (ref 38–126)
Anion gap: 12 (ref 5–15)
BUN: 9 mg/dL (ref 6–20)
CO2: 29 mmol/L (ref 22–32)
Calcium: 9 mg/dL (ref 8.9–10.3)
Chloride: 96 mmol/L — ABNORMAL LOW (ref 101–111)
Creatinine, Ser: 0.81 mg/dL (ref 0.61–1.24)
GFR calc Af Amer: >60 mL/min
GFR calc non Af Amer: >60 mL/min
Glucose, Bld: 162 mg/dL — ABNORMAL HIGH (ref 65–99)
Potassium: 3.2 mmol/L — ABNORMAL LOW (ref 3.5–5.1)
Sodium: 137 mmol/L (ref 135–145)
Total Bilirubin: 0.7 mg/dL (ref 0.3–1.2)
Total Protein: 6.4 g/dL — ABNORMAL LOW (ref 6.5–8.1)

## 2018-02-26 MED ORDER — ONDANSETRON HCL 4 MG/2ML IJ SOLN: 4.0000 mg | Freq: Four times a day (QID) | INTRAMUSCULAR | Status: DC | PRN

## 2018-02-26 MED ORDER — CITALOPRAM HYDROBROMIDE 20 MG PO TABS
20.0000 mg | ORAL_TABLET | Freq: Every day | ORAL | Status: DC
  Administered 2018-02-27 – 2018-03-03 (×5): 20 mg via ORAL
  Filled 2018-02-26 (×5): qty 1

## 2018-02-26 MED ORDER — SODIUM CHLORIDE 0.9 % IV SOLN
1.0000 g | Freq: Three times a day (TID) | INTRAVENOUS | Status: DC
  Administered 2018-02-27 – 2018-03-03 (×15): 1 g via INTRAVENOUS
  Filled 2018-02-26 (×16): qty 1

## 2018-02-26 MED ORDER — ONDANSETRON HCL 4 MG PO TABS: 4.0000 mg | ORAL_TABLET | Freq: Four times a day (QID) | ORAL | Status: DC | PRN

## 2018-02-26 MED ORDER — LORAZEPAM 1 MG PO TABS
1.0000 mg | ORAL_TABLET | Freq: Every day | ORAL | Status: DC
  Administered 2018-02-26 – 2018-03-03 (×5): 1 mg via ORAL
  Filled 2018-02-26 (×7): qty 1

## 2018-02-26 MED ORDER — SODIUM CHLORIDE 0.9 % IV SOLN
1.0000 g | Freq: Three times a day (TID) | INTRAVENOUS | Status: DC
  Filled 2018-02-26 (×2): qty 1

## 2018-02-26 NOTE — Progress Notes (Signed)
Patient arrived from [facility] via [place]. VSS. Pt c/o pain in right lower abdomen and pelvic are. Right lower abdomen and pelvic are red, firm, and tender. Patient had an RAFA IV site in place on arrival that was noted to be infiltrated. RN stopped antibiotic infusion and removed the IV. Site is red, swollen, and tender.   Per report from the patient's son: he has a collar around his penile sphincter. There is a pump in his scrotum that is squeezed three times to open the collar to allow the patient to urinate. Per the son, the patient is not able to do this on his own any longer to relieve his bladder.   Hospitalist notified of patient's arrival, awaiting orders.

## 2018-02-26 NOTE — Consult Note (Signed)
Urology Consult  Referring physician: Hillary Bow Reason for referral: infected artificial sphincter  Chief Complaint: infected artificial sphincter  History of Present Illness: Patient well-known to me with an artificial urinary sphincter.  He had catheter trauma well-documented and I had deactivated device for several weeks.  It was reactivated after a recheck.  He was sent home on daily Macrodantin.  I was called from an outside hospital that he had an infection.  Because of travel and short notice yesterday I had him transferred down here today.   History is limited.  His son said the sphincter likely was working well.  A few days ago he just became dizzy and did not feel well was taken to the hospital.  Yesterday and possibly today they in and out catheterized him.  His son does not think he is on Plavix any longer.  His son describes him being treated for a pyelonephritis  Review of medical records:lactic acid 2.1; WBC 10.6; Cr normal; blood c/s April 19 negative; urine c/s pseudomonas; chest xray OK; CT scan: normal kidneys and inflammation lower abdominal wall; no abscess and no ascites; no blood thinners medical records;   Sore lower abdomen  Modifying factors: There are no other modifying factors  Associated signs and symptoms: There are no other associated signs and symptoms Aggravating and relieving factors: There are no other aggravating or relieving factors Severity: Moderate Duration: Persistent    Past Medical History:  Diagnosis Date  . CAD (coronary artery disease)    moderate by catheterization,11/08...essentially unchanged by repeat study,10/11  . Carotid artery disease     less than 50% stenosis bilaterally March 2013   . Claudication     ABIs within ABIs within normal limits, 40% left iliac disease by catheterization   . Colon polyps   . Depression   . Dizzy   . Edema extremities     secondary to severe varicose veins. Nonpitting edema and dermatitis.   . Emphysema    . Glaucoma   . Hyperlipidemia   . IBS (irritable bowel syndrome)   . Mitral regurgitation    mild  . Mixed dyslipidemia   . Peripheral edema   . Pneumonia   . Prostate cancer    s/p radiation treatment/s/p TURP  . Shortness of breath    Emphysema  . Thyroid nodule     posterior and inferior to the right lobepossibly outside of the gland secondary to parathyroid adenoma in addition multiple bilateral thyroid nodules    Past Surgical History:  Procedure Laterality Date  . CARDIAC CATHETERIZATION  2008  . CHOLECYSTECTOMY    . INCONTINENCE SURGERY     ring inserted  . TONSILLECTOMY    . TRANSURETHRAL RESECTION OF PROSTATE     has stress urinary incontinence    Medications: I have reviewed the patient's current medications. Allergies:  Allergies  Allergen Reactions  . Aspirin     REACTION: SPOTS    Family History  Problem Relation Age of Onset  . Heart attack Sister        in her 40's  . Other Mother        lived to be 42  . Pancreatic cancer Father   . Colon cancer Neg Hx   . Crohn's disease Son    Social History:  reports that he quit smoking about 34 years ago. His smoking use included cigarettes. He has a 40.00 pack-year smoking history. He has never used smokeless tobacco. He reports that he does not drink  alcohol or use drugs.  ROS: All systems are reviewed and negative except as noted. Rest negative   Physical Exam:  Vital signs in last 24 hours:    Cardiovascular: Skin warm; not flushed Respiratory: Breaths quiet; no shortness of breath Abdomen: No masses Neurological: Normal sensation to touch Musculoskeletal: Normal motor function arms and legs Lymphatics: No inguinal adenopathy Skin: No rashes Genitourinary: The patient's lower abdomen was mildly erythematous and swollen in the right lower quadrant and minimally tender.  It was also involving the right lower quadrant incision.  There is no crepitus.  The scrotum and penis were a little bit swollen  and almost indurated.  There was scrotal swelling and I could feel the pump but not enough to try to deactivate the cuff.  The perineum was minimally swollen and I believe the cuff was tender and swollen.  Laboratory Data:  No results found for this or any previous visit (from the past 72 hour(s)). No results found for this or any previous visit (from the past 240 hour(s)). Creatinine: No results for input(s): CREATININE in the last 168 hours.  Xrays: See report/chart See above  Impression/Assessment:  Clinically the patient has infection of the artificial sphincter.  He has Pseudomonas in his urine that is sensitive to ciprofloxacin.  I will find out what antibiotic he is currently on.  It is important to make certain he is not on blood thinners.  He was scanned for 1 L so I gently placed a 14 French catheter and I did not try to deactivate the artificial sphincter.  It went in very easily.  There was clear urine.  Plan:  The pros cons and risk of artificial sphincter removal was discussed. Risks of bowel injury and injury to other structures and sequelae discussed.  Worsening sepsis discussed.  Worsening incontinence discussed.  I was cystoscope on if he does have urethral erosion and he will have a Foley catheter for multiple weeks discussed.  I appreciate the medical team providing medical assistance and broad-spectrum antibiotic coverage.  Armani Gawlik A 02/26/2018, 6:16 PM

## 2018-02-26 NOTE — H&P (View-Only) (Signed)
Urology Consult  Referring physician: Hillary Bow Reason for referral: infected artificial sphincter  Chief Complaint: infected artificial sphincter  History of Present Illness: Patient well-known to me with an artificial urinary sphincter.  He had catheter trauma well-documented and I had deactivated device for several weeks.  It was reactivated after a recheck.  He was sent home on daily Macrodantin.  I was called from an outside hospital that he had an infection.  Because of travel and short notice yesterday I had him transferred down here today.   History is limited.  His son said the sphincter likely was working well.  A few days ago he just became dizzy and did not feel well was taken to the hospital.  Yesterday and possibly today they in and out catheterized him.  His son does not think he is on Plavix any longer.  His son describes him being treated for a pyelonephritis  Review of medical records:lactic acid 2.1; WBC 10.6; Cr normal; blood c/s April 19 negative; urine c/s pseudomonas; chest xray OK; CT scan: normal kidneys and inflammation lower abdominal wall; no abscess and no ascites; no blood thinners medical records;   Sore lower abdomen  Modifying factors: There are no other modifying factors  Associated signs and symptoms: There are no other associated signs and symptoms Aggravating and relieving factors: There are no other aggravating or relieving factors Severity: Moderate Duration: Persistent    Past Medical History:  Diagnosis Date  . CAD (coronary artery disease)    moderate by catheterization,11/08...essentially unchanged by repeat study,10/11  . Carotid artery disease     less than 50% stenosis bilaterally March 2013   . Claudication     ABIs within ABIs within normal limits, 40% left iliac disease by catheterization   . Colon polyps   . Depression   . Dizzy   . Edema extremities     secondary to severe varicose veins. Nonpitting edema and dermatitis.   . Emphysema    . Glaucoma   . Hyperlipidemia   . IBS (irritable bowel syndrome)   . Mitral regurgitation    mild  . Mixed dyslipidemia   . Peripheral edema   . Pneumonia   . Prostate cancer    s/p radiation treatment/s/p TURP  . Shortness of breath    Emphysema  . Thyroid nodule     posterior and inferior to the right lobepossibly outside of the gland secondary to parathyroid adenoma in addition multiple bilateral thyroid nodules    Past Surgical History:  Procedure Laterality Date  . CARDIAC CATHETERIZATION  2008  . CHOLECYSTECTOMY    . INCONTINENCE SURGERY     ring inserted  . TONSILLECTOMY    . TRANSURETHRAL RESECTION OF PROSTATE     has stress urinary incontinence    Medications: I have reviewed the patient's current medications. Allergies:  Allergies  Allergen Reactions  . Aspirin     REACTION: SPOTS    Family History  Problem Relation Age of Onset  . Heart attack Sister        in her 46's  . Other Mother        lived to be 38  . Pancreatic cancer Father   . Colon cancer Neg Hx   . Crohn's disease Son    Social History:  reports that he quit smoking about 34 years ago. His smoking use included cigarettes. He has a 40.00 pack-year smoking history. He has never used smokeless tobacco. He reports that he does not drink  alcohol or use drugs.  ROS: All systems are reviewed and negative except as noted. Rest negative   Physical Exam:  Vital signs in last 24 hours:    Cardiovascular: Skin warm; not flushed Respiratory: Breaths quiet; no shortness of breath Abdomen: No masses Neurological: Normal sensation to touch Musculoskeletal: Normal motor function arms and legs Lymphatics: No inguinal adenopathy Skin: No rashes Genitourinary: The patient's lower abdomen was mildly erythematous and swollen in the right lower quadrant and minimally tender.  It was also involving the right lower quadrant incision.  There is no crepitus.  The scrotum and penis were a little bit swollen  and almost indurated.  There was scrotal swelling and I could feel the pump but not enough to try to deactivate the cuff.  The perineum was minimally swollen and I believe the cuff was tender and swollen.  Laboratory Data:  No results found for this or any previous visit (from the past 72 hour(s)). No results found for this or any previous visit (from the past 240 hour(s)). Creatinine: No results for input(s): CREATININE in the last 168 hours.  Xrays: See report/chart See above  Impression/Assessment:  Clinically the patient has infection of the artificial sphincter.  He has Pseudomonas in his urine that is sensitive to ciprofloxacin.  I will find out what antibiotic he is currently on.  It is important to make certain he is not on blood thinners.  He was scanned for 1 L so I gently placed a 14 French catheter and I did not try to deactivate the artificial sphincter.  It went in very easily.  There was clear urine.  Plan:  The pros cons and risk of artificial sphincter removal was discussed. Risks of bowel injury and injury to other structures and sequelae discussed.  Worsening sepsis discussed.  Worsening incontinence discussed.  I was cystoscope on if he does have urethral erosion and he will have a Foley catheter for multiple weeks discussed.  I appreciate the medical team providing medical assistance and broad-spectrum antibiotic coverage.  Eriyonna Matsushita A 02/26/2018, 6:16 PM

## 2018-02-26 NOTE — H&P (Signed)
History and Physical    Warren [name] Warren [name] DOB: 1921-12-27 DOA: 02/26/2018  PCP: [name], MD   [facility]  Chief Complaint: Infected urethral prostetic device  HPI: Warren [name] is a 84 y.o. male with medical history significant for HTN, also history of coronary artery, carotid artery disease and emphysema patient denies the later 3.  Admitted to [facility] 4/19- 4/26, with initial complaints of nausea dizziness.  Most of the history is obtained from the patient, his family and urology note.  I see imaging and lab results but I do not see a transfer summary in the physical chart.  UA was suggestive of UTI with large leukocyte esterase, Lactic acid 2.1, WBC 13 , hemoglobin 13.3 ,Patient was initially started on ciprofloxacin at [facility] for UTI, as  but with urine cultures growing >100,000 colonies of Pseudomonas, sensitive to ciprofloxacin Zosyn and meropenem, IV antibiotics were switched to meropenem 1- 2 days prior to transfer.  Chest x-ray on admission at [facility]- mild pulmonary vascular congestion otherwise no acute pulmonary disease, CT abdomen pelvis without contrast 4/19- diverticulosis without diverticulitis mild inflammatory changes in the anterior abdominal wall, some of these surrounding the tube for the ureteral prostesis, this likely represents some focal cellulitis. No definitive abscess is noted.  Patient and family report that prostesis was placed ~ 10 yrs ago, patient had done well for ~ 8 yrs, but have had some problems over the past 2 yrs. Prior to admission patient had being on low dose bactrim and then  Macrodantin for urinary problems/infection.   Urology Dr. [name], was consulted, from [facility] because of travel and short notice, patient was transferred here.  Patient has an artificial urinary sphincter.  He had catheter trauma, the device had been deactivated for  several weeks and subsequently reactivated after a recheck.  Patient was transferred here for infected urinary prosthesis.  Patient was accepted in transfer by Hospitalist Dr. Wendee Beavers.  Review of Systems: As per HPI otherwise 10 point review of systems negative.    Past Medical History:  Diagnosis Date  . CAD (coronary artery disease)    moderate by catheterization,11/08...essentially unchanged by repeat study,10/11  . Carotid artery disease (HCC)     less than 50% stenosis bilaterally March 2013   . Claudication ([place])     ABIs within ABIs within normal limits, 40% left iliac disease by catheterization   . Colon polyps   . Depression   . Dizzy   . Edema extremities     secondary to severe varicose veins. Nonpitting edema and dermatitis.   . Emphysema   . Glaucoma   . Hyperlipidemia   . IBS (irritable bowel syndrome)   . Mitral regurgitation    mild  . Mixed dyslipidemia   . Peripheral edema   . Pneumonia   . Prostate cancer [facility])    s/p radiation treatment/s/p TURP  . Shortness of breath    Emphysema  . Thyroid nodule     posterior and inferior to the right lobepossibly outside of the gland secondary to parathyroid adenoma in addition multiple bilateral thyroid nodules     Past Surgical History:  Procedure Laterality Date  . CARDIAC CATHETERIZATION  2008  . CHOLECYSTECTOMY    . INCONTINENCE SURGERY     ring inserted  . TONSILLECTOMY    . TRANSURETHRAL RESECTION OF PROSTATE     has stress urinary incontinence     reports that he quit  smoking about 34 years ago. His smoking use included cigarettes. He has a 40.00 pack-year smoking history. He has never used smokeless tobacco. He reports that he does not drink alcohol or use drugs.  Allergies  Allergen Reactions  . Aspirin     REACTION: SPOTS REACTION: SPOTS  . Latex   . Penicillins     Family History  Problem Relation Age of Onset  . Heart attack Sister        in her 58's  . Other Mother        lived to be 64   . Pancreatic cancer Father   . Crohn's disease Son   . Colon cancer Neg Hx     Prior to Admission medications   Medication Sig Start Date End Date Taking? Authorizing Provider  Cholecalciferol (VITAMIN D-3) 1000 UNITS CAPS Take 1 capsule by mouth daily.    [provider]  citalopram (CELEXA) 20 MG tablet Take 20 mg by mouth daily.      [provider]  clopidogrel (PLAVIX) 75 MG tablet TAKE 1 TABLET ONCE DAILY 06/28/12   de Stanford Scotland, MD  Krill Oil 300 MG CAPS Take 1 capsule by mouth daily.    [provider]  LORazepam (ATIVAN) 1 MG tablet Take 1 mg by mouth 2 (two) times daily.      [provider]  nitroGLYCERIN (NITROSTAT) 0.4 MG SL tablet Place 0.4 mg under the tongue every 5 (five) minutes as needed. May repeat for up to 3 doses.     [provider]  pravastatin (PRAVACHOL) 10 MG tablet Take 1 tablet (10 mg total) by mouth daily. (HOLD X 2 WEEKS) 01/27/12   de Stanford Scotland, MD  atorvastatin (LIPITOR) 40 MG tablet Take 40 mg by mouth at bedtime.    01/27/12  [provider]  furosemide (LASIX) 20 MG tablet Take 1 tablet (20 mg total) by mouth daily. 07/30/11 01/27/12  de Stanford Scotland, MD    Physical Exam: Vitals:   02/26/18 1800  BP: (!) 145/94  Pulse: 88  Resp: 18  Temp: 99.1 F (37.3 C)  TempSrc: Oral  SpO2: 92%  Weight: 79.6 kg (175 lb 7.8 oz)  Height: 5\' 7"  (1.702 m)    Constitutional: NAD, calm, comfortable, hard of hearing Vitals:   02/26/18 1800  BP: (!) 145/94  Pulse: 88  Resp: 18  Temp: 99.1 F (37.3 C)  TempSrc: Oral  SpO2: 92%  Weight: 79.6 kg (175 lb 7.8 oz)  Height: 5\' 7"  (1.702 m)   Eyes: PERRL, lids and conjunctivae normal ENMT: Mucous membranes are moist. Posterior pharynx clear of any exudate or lesions.Normal dentition.  Neck: normal, supple, no masses, no thyromegaly Respiratory: clear to auscultation bilaterally, no wheezing, no crackles. Normal respiratory effort. No accessory muscle use.    Cardiovascular: Regular rate and rhythm, no murmurs / rubs / gallops. No extremity edema. 2+ pedal pulses. No carotid bruits.  Abdomen:  Tenderness lower abdomen, no masses palpated. No hepatosplenomegaly. Bowel sounds positive. Foley draining clear urine. Musculoskeletal: no clubbing / cyanosis. No joint deformity upper and lower extremities. Good ROM, no contractures. Normal muscle tone.  Skin: no rashes, lesions, ulcers. No induration Neurologic: CN 2-12 grossly intact.  Moving all extremities spontaneously Psychiatric: Normal judgment and insight. Alert and oriented x 3. Normal mood.   Labs on Admission: I have personally reviewed following labs and imaging studies  Radiological Exams on Admission: No results found.  EKG: None.  Assessment/Plan  Active Problems:   UTI (urinary tract infection)  Infected Urinary prosthesis - with Pseudomonas UTI . WBC- 13 on admission at [name]. Lactic acid- 2.1.  - Cont IV meropenem started prior to admission, per pharm - Baseline CBC, CMP  - NPO midnight - Foley inserted by Urology, after bladder scan revealed 1L urine, Clear urine drained.   HTN, Hx of CAD, carotid artery dx- Stable. Not on antiHTN. Denies chest pain. - Pt has not taken plavix in several months-yrs., wiol not resume.  Anxiety- TAkes Ativan 1mg  everynight, celexa - Resume home ativan - Cont home celexa  DVT prophylaxis: Scds Code Status: Full, confirmed with Daughter- Lita Mains and Son -Dhiren Azimi who both are  HCPOA, and present at bedside.  Family Communication: Per above Disposition Plan: Per rounding team Consults called: urology Admission status: Inpt, med-surg   Bethena Roys MD [facility] Pager [phone] From 6PM-2AM.  Otherwise please contact night-coverage [email or web address] Password [facility]  02/26/2018, 7:39 PM

## 2018-02-26 NOTE — Progress Notes (Signed)
Pharmacy Antibiotic Note  Warren [name] is a 84 y.o. male with an artificial urinary sphincter admitted on 02/26/2018 from [facility] for management of the artificial sphincter infection. UCx positive for PsA.  Patient was started on cipro and then switched to meropenem at [facility]. To resume meropenem for UTI at [facility].  - pt's RN stated that last meropenem dose was given at 5PM on 4/26  - 4/26 UCX at [facility]:  PsA (S= cipro, zosyn, merrem; R= cefepime, gent) - scr was 0.86 on 4/19 at [facility]: - meropenem 1gm IV q8h  _________________________________________  Height: 5\' 7"  (170.2 cm) Weight: 175 lb 7.8 oz (79.6 kg) IBW/kg (Calculated) : 66.1  Temp (24hrs), Avg:99.1 F (37.3 C), Min:99.1 F (37.3 C), Max:99.1 F (37.3 C)  No results for input(s): WBC, CREATININE, LATICACIDVEN, VANCOTROUGH, VANCOPEAK, VANCORANDOM, GENTTROUGH, GENTPEAK, GENTRANDOM, TOBRATROUGH, TOBRAPEAK, TOBRARND, AMIKACINPEAK, AMIKACINTROU, AMIKACIN in the last 168 hours.  CrCl cannot be calculated (Patient's most recent lab result is older than the maximum 21 days allowed.).    Allergies  Allergen Reactions  . Aspirin     REACTION: SPOTS REACTION: SPOTS  . Latex     unknown  . Penicillins     Has patient had a PCN reaction causing immediate rash, facial/tongue/throat swelling, SOB or lightheadedness with hypotension:No Has patient had a PCN reaction causing severe rash involving mucus membranes or skin necrosis: No Has patient had a PCN reaction that required hospitalization:NoHas patient had a PCN reaction occurring within the last 10 years:No If all of the above answers are "NO", then may proceed with Cephalosporin use.      Thank you for allowing pharmacy to be a part of this patient's care.  Lynelle Doctor 02/26/2018 8:19 PM

## 2018-02-27 ENCOUNTER — Encounter (HOSPITAL_COMMUNITY): Payer: Self-pay | Admitting: *Deleted

## 2018-02-27 ENCOUNTER — Inpatient Hospital Stay (HOSPITAL_COMMUNITY): Payer: Medicare Other | Admitting: Anesthesiology

## 2018-02-27 ENCOUNTER — Encounter (HOSPITAL_COMMUNITY)
Admission: AD | Disposition: A | Discharge status: Skilled Nursing Facility | Payer: Self-pay | Source: Other Acute Inpatient Hospital | Attending: Internal Medicine

## 2018-02-27 HISTORY — PX: CYSTOSCOPY: SHX5120

## 2018-02-27 HISTORY — PX: URINARY SPHINCTER IMPLANT: SHX2624

## 2018-02-27 LAB — SURGICAL PCR SCREEN
MRSA, PCR: NEGATIVE
STAPHYLOCOCCUS AUREUS: NEGATIVE

## 2018-02-27 SURGERY — INSERTION, ARTIFICIAL URINARY SPHINCTER
Anesthesia: General

## 2018-02-27 MED ORDER — PROPOFOL 10 MG/ML IV BOLUS
INTRAVENOUS | Status: DC | PRN
  Administered 2018-02-27: 50 mg via INTRAVENOUS
  Administered 2018-02-27: 20 mg via INTRAVENOUS

## 2018-02-27 MED ORDER — FENTANYL CITRATE (PF) 100 MCG/2ML IJ SOLN: 25.0000 ug | INTRAMUSCULAR | Status: DC | PRN

## 2018-02-27 MED ORDER — MORPHINE SULFATE (PF) 4 MG/ML IV SOLN
1.0000 mg | INTRAVENOUS | Status: DC | PRN
  Administered 2018-02-27: 1 mg via INTRAVENOUS
  Filled 2018-02-27: qty 1

## 2018-02-27 MED ORDER — SODIUM CHLORIDE 0.9 % IV SOLN
INTRAVENOUS | Status: DC | PRN
  Administered 2018-02-27: 500 mL

## 2018-02-27 MED ORDER — ROCURONIUM BROMIDE 50 MG/5ML IV SOSY
PREFILLED_SYRINGE | INTRAVENOUS | Status: DC | PRN
  Administered 2018-02-27: 40 mg via INTRAVENOUS
  Administered 2018-02-27: 10 mg via INTRAVENOUS

## 2018-02-27 MED ORDER — SODIUM CHLORIDE 0.9 % IV SOLN
INTRAVENOUS | Status: DC | PRN
  Administered 2018-02-27: 40 ug/min via INTRAVENOUS

## 2018-02-27 MED ORDER — LACTATED RINGERS IV SOLN
INTRAVENOUS | Status: DC | PRN
  Administered 2018-02-27: 09:00:00 via INTRAVENOUS

## 2018-02-27 MED ORDER — PHENYLEPHRINE HCL 10 MG/ML IJ SOLN
INTRAMUSCULAR | Status: DC | PRN
  Administered 2018-02-27: 200 ug via INTRAVENOUS
  Administered 2018-02-27: 80 ug via INTRAVENOUS
  Administered 2018-02-27 (×2): 40 ug via INTRAVENOUS
  Administered 2018-02-27: 80 ug via INTRAVENOUS

## 2018-02-27 MED ORDER — MORPHINE BOLUS VIA INFUSION: 1.0000 mg | INTRAVENOUS | Status: DC | PRN

## 2018-02-27 MED ORDER — SUGAMMADEX SODIUM 200 MG/2ML IV SOLN
INTRAVENOUS | Status: DC | PRN
  Administered 2018-02-27: 200 mg via INTRAVENOUS

## 2018-02-27 MED ORDER — ONDANSETRON HCL 4 MG/2ML IJ SOLN
INTRAMUSCULAR | Status: DC | PRN
  Administered 2018-02-27: 4 mg via INTRAVENOUS

## 2018-02-27 MED ORDER — POTASSIUM CHLORIDE CRYS ER 20 MEQ PO TBCR
20.0000 meq | EXTENDED_RELEASE_TABLET | Freq: Two times a day (BID) | ORAL | Status: DC
  Administered 2018-02-27 – 2018-03-03 (×9): 20 meq via ORAL
  Filled 2018-02-27 (×9): qty 1

## 2018-02-27 MED ORDER — SODIUM CHLORIDE 0.9 % IV SOLN
INTRAVENOUS | Status: AC
  Filled 2018-02-27: qty 500000

## 2018-02-27 MED ORDER — EPHEDRINE SULFATE 50 MG/ML IJ SOLN
INTRAMUSCULAR | Status: DC | PRN
  Administered 2018-02-27: 10 mg via INTRAVENOUS

## 2018-02-27 MED ORDER — DEXAMETHASONE SODIUM PHOSPHATE 10 MG/ML IJ SOLN
INTRAMUSCULAR | Status: DC | PRN
  Administered 2018-02-27: 10 mg via INTRAVENOUS

## 2018-02-27 MED ORDER — HYDROCODONE-ACETAMINOPHEN 5-325 MG PO TABS
1.0000 | ORAL_TABLET | Freq: Four times a day (QID) | ORAL | Status: DC | PRN
  Administered 2018-02-27 – 2018-02-28 (×2): 1 via ORAL
  Administered 2018-03-01: 2 via ORAL
  Filled 2018-02-27: qty 2
  Filled 2018-02-27 (×2): qty 1

## 2018-02-27 MED ORDER — FENTANYL CITRATE (PF) 100 MCG/2ML IJ SOLN
INTRAMUSCULAR | Status: AC
  Filled 2018-02-27: qty 2

## 2018-02-27 MED ORDER — LIDOCAINE 2% (20 MG/ML) 5 ML SYRINGE
INTRAMUSCULAR | Status: DC | PRN
  Administered 2018-02-27: 75 mg via INTRAVENOUS

## 2018-02-27 MED ORDER — STERILE WATER FOR IRRIGATION IR SOLN
Status: DC | PRN
  Administered 2018-02-27: 3000 mL

## 2018-02-27 MED ORDER — 0.9 % SODIUM CHLORIDE (POUR BTL) OPTIME
TOPICAL | Status: DC | PRN
  Administered 2018-02-27: 2000 mL

## 2018-02-27 MED ORDER — ONDANSETRON HCL 4 MG/2ML IJ SOLN: 4.0000 mg | Freq: Four times a day (QID) | INTRAMUSCULAR | Status: DC | PRN

## 2018-02-27 MED ORDER — FENTANYL CITRATE (PF) 100 MCG/2ML IJ SOLN
INTRAMUSCULAR | Status: DC | PRN
  Administered 2018-02-27 (×2): 25 ug via INTRAVENOUS

## 2018-02-27 SURGICAL SUPPLY — 54 items
ADH SKN CLS APL DERMABOND .7 (GAUZE/BANDAGES/DRESSINGS)
BAG DECANTER FOR FLEXI CONT (MISCELLANEOUS) ×3 IMPLANT
BAG URINE DRAINAGE (UROLOGICAL SUPPLIES) ×3 IMPLANT
BLADE SURG 15 STRL LF DISP TIS (BLADE) ×2 IMPLANT
BLADE SURG 15 STRL SS (BLADE) ×6
BNDG GAUZE ELAST 4 BULKY (GAUZE/BANDAGES/DRESSINGS) ×3 IMPLANT
BRIEF STRETCH FOR OB PAD LRG (UNDERPADS AND DIAPERS) ×3 IMPLANT
CATH FOLEY 2WAY SLVR  5CC 14FR (CATHETERS) ×2
CATH FOLEY 2WAY SLVR 5CC 14FR (CATHETERS) ×1 IMPLANT
CATH SILICONE 14FRX5CC (CATHETERS) ×2 IMPLANT
COVER MAYO STAND STRL (DRAPES) ×3 IMPLANT
COVER SURGICAL LIGHT HANDLE (MISCELLANEOUS) ×2 IMPLANT
DECANTER SPIKE VIAL GLASS SM (MISCELLANEOUS) ×9 IMPLANT
DERMABOND ADVANCED (GAUZE/BANDAGES/DRESSINGS)
DERMABOND ADVANCED .7 DNX12 (GAUZE/BANDAGES/DRESSINGS) ×2 IMPLANT
DISSECTOR ROUND CHERRY 3/8 STR (MISCELLANEOUS) ×3 IMPLANT
DRAPE SHEET LG 3/4 BI-LAMINATE (DRAPES) IMPLANT
DRSG PAD ABDOMINAL 8X10 ST (GAUZE/BANDAGES/DRESSINGS) ×4 IMPLANT
DRSG TELFA 3X8 NADH (GAUZE/BANDAGES/DRESSINGS) IMPLANT
ELECT PENCIL ROCKER SW 15FT (MISCELLANEOUS) ×3 IMPLANT
ELECT REM PT RETURN 15FT ADLT (MISCELLANEOUS) ×3 IMPLANT
GAUZE 4X4 16PLY RFD (DISPOSABLE) ×8 IMPLANT
GAUZE SPONGE 4X4 12PLY STRL (GAUZE/BANDAGES/DRESSINGS) ×2 IMPLANT
GLOVE BIO SURGEON STRL SZ 6.5 (GLOVE) ×2 IMPLANT
GLOVE BIO SURGEONS STRL SZ 6.5 (GLOVE) ×1
GLOVE BIOGEL M STRL SZ7.5 (GLOVE) ×3 IMPLANT
GOWN STRL REUS W/TWL XL LVL3 (GOWN DISPOSABLE) ×3 IMPLANT
KIT BASIN OR (CUSTOM PROCEDURE TRAY) ×3 IMPLANT
LOOP VESSEL MAXI BLUE (MISCELLANEOUS) ×3 IMPLANT
PACK CYSTO (CUSTOM PROCEDURE TRAY) ×3 IMPLANT
PAD ABD 8X10 STRL (GAUZE/BANDAGES/DRESSINGS) ×2 IMPLANT
PAD DRESSING TELFA 3X8 NADH (GAUZE/BANDAGES/DRESSINGS) ×1 IMPLANT
PLUG CATH AND CAP STER (CATHETERS) ×3 IMPLANT
POSITIONER SURGICAL ARM (MISCELLANEOUS) ×3 IMPLANT
SHEET LAVH (DRAPES) ×3 IMPLANT
SPONGE LAP 4X18 X RAY DECT (DISPOSABLE) ×2 IMPLANT
SUT CHROMIC 3 0 SH 27 (SUTURE) ×2 IMPLANT
SUT MNCRL AB 4-0 PS2 18 (SUTURE) ×2 IMPLANT
SUT SILK 0 FSL (SUTURE) ×1 IMPLANT
SUT SILK 2 0 SH (SUTURE) ×2 IMPLANT
SUT VIC AB 0 CT1 27 (SUTURE)
SUT VIC AB 0 CT1 27XBRD ANTBC (SUTURE) ×1 IMPLANT
SUT VIC AB 3-0 SH 27 (SUTURE) ×6
SUT VIC AB 3-0 SH 27X BRD (SUTURE) ×4 IMPLANT
SWAB COLLECTION DEVICE MRSA (MISCELLANEOUS) ×2 IMPLANT
SWAB CULTURE ESWAB REG 1ML (MISCELLANEOUS) ×2 IMPLANT
SYR 10ML LL (SYRINGE) ×6 IMPLANT
SYR 20CC LL (SYRINGE) ×3 IMPLANT
SYR 30ML LL (SYRINGE) ×3 IMPLANT
TOWEL OR 17X26 10 PK STRL BLUE (TOWEL DISPOSABLE) ×6 IMPLANT
TOWEL OR NON WOVEN STRL DISP B (DISPOSABLE) ×3 IMPLANT
TUBING CONNECTING 10 (TUBING) ×2 IMPLANT
TUBING CONNECTING 10' (TUBING) ×1
YANKAUER SUCT BULB TIP 10FT TU (MISCELLANEOUS) ×3 IMPLANT

## 2018-02-27 NOTE — Interval H&P Note (Signed)
History and Physical Interval Note:  02/27/2018 10:57 AM  [name]  has presented today for surgery, with the diagnosis of sepsis  The various methods of treatment have been discussed with the patient and family. After consideration of risks, benefits and other options for treatment, the patient has consented to  Procedure(s): REMOVAL INFECTED ARTIFICIAL URINARY SPHINCTER (N/[name]) as [name] surgical intervention .  The patient's history has been reviewed, patient examined, no change in status, stable for surgery.  I have reviewed the patient's chart and labs.  Questions were answered to the patient's satisfaction.     Warren [name]

## 2018-02-27 NOTE — Transfer of Care (Signed)
Immediate Anesthesia Transfer of Care Note  Patient: Warren [name]  Procedure(s) Performed: REMOVAL INFECTED ARTIFICIAL URINARY SPHINCTER (N/A ) CYSTOSCOPY (N/A )  Patient Location: PACU  Anesthesia Type:General  Level of Consciousness: awake, alert  and patient cooperative  Airway & Oxygen Therapy: Patient Spontanous Breathing and Patient connected to face mask oxygen  Post-op Assessment: Report given to RN and Post -op Vital signs reviewed and stable  Post vital signs: Reviewed and stable  Last Vitals:  Vitals Value Taken Time  BP 144/92 02/27/2018 11:15 AM  Temp    Pulse 89 02/27/2018 11:20 AM  Resp 21 02/27/2018 11:20 AM  SpO2 100 % 02/27/2018 11:20 AM  Vitals shown include unvalidated device data.  Last Pain:  Vitals:   02/27/18 0543  TempSrc: Oral  PainSc:       Patients Stated Pain Goal: 2 (67/12/45 8099)  Complications: No apparent anesthesia complications

## 2018-02-27 NOTE — Plan of Care (Signed)
Patient stable since surgery this a.m., tolerating regular diet, minimal complaints of pain controlled with po pain medication.  Multiple family members at bedside throughout shift.

## 2018-02-27 NOTE — Anesthesia Preprocedure Evaluation (Signed)
Anesthesia Evaluation  Patient identified by MRN, date of birth, ID band Patient awake    Reviewed: Allergy & Precautions, NPO status , Patient's Chart, lab work & pertinent test results  Airway Mallampati: II  TM Distance: >3 FB Neck ROM: Full    Dental  (+) Dental Advisory Given   Pulmonary COPD, former smoker,    breath sounds clear to auscultation       Cardiovascular hypertension, + CAD and + Peripheral Vascular Disease   Rhythm:Regular Rate:Normal     Neuro/Psych Depression negative neurological ROS     GI/Hepatic negative GI ROS, Neg liver ROS,   Endo/Other  negative endocrine ROS  Renal/GU negative Renal ROS     Musculoskeletal   Abdominal   Peds  Hematology negative hematology ROS (+)   Anesthesia Other Findings   Reproductive/Obstetrics                             Lab Results  Component Value Date   WBC 11.3 (H) 02/26/2018   HGB 14.5 02/26/2018   HCT 43.0 02/26/2018   MCV 92.9 02/26/2018   PLT 228 02/26/2018   Lab Results  Component Value Date   CREATININE 0.81 02/26/2018   BUN 9 02/26/2018   NA 137 02/26/2018   K 3.2 (L) 02/26/2018   CL 96 (L) 02/26/2018   CO2 29 02/26/2018    Anesthesia Physical Anesthesia Plan  ASA: III  Anesthesia Plan: General   Post-op Pain Management:    Induction: Intravenous  PONV Risk Score and Plan: 2 and Ondansetron, Dexamethasone and Treatment may vary due to age or medical condition  Airway Management Planned: Oral ETT  Additional Equipment:   Intra-op Plan:   Post-operative Plan: Extubation in OR  Informed Consent: I have reviewed the patients History and Physical, chart, labs and discussed the procedure including the risks, benefits and alternatives for the proposed anesthesia with the patient or authorized representative who has indicated his/her understanding and acceptance.   Dental advisory given  Plan Discussed  with: CRNA  Anesthesia Plan Comments:         Anesthesia Quick Evaluation

## 2018-02-27 NOTE — Anesthesia Procedure Notes (Signed)
Procedure Name: Intubation Date/Time: 02/27/2018 9:39 AM Performed by: West Pugh, CRNA Pre-anesthesia Checklist: Patient identified, Emergency Drugs available, Suction available, Patient being monitored and Timeout performed Patient Re-evaluated:Patient Re-evaluated prior to induction Oxygen Delivery Method: Circle system utilized Preoxygenation: Pre-oxygenation with 100% oxygen Induction Type: IV induction and Cricoid Pressure applied Ventilation: Two handed mask ventilation required and Oral airway inserted - appropriate to patient size Laryngoscope Size: Mac and 4 Grade View: Grade II Tube type: Oral Number of attempts: 1 Airway Equipment and Method: Stylet Placement Confirmation: ETT inserted through vocal cords under direct vision,  positive ETCO2,  CO2 detector and breath sounds checked- equal and bilateral Secured at: 23 cm Tube secured with: Tape Dental Injury: Teeth and Oropharynx as per pre-operative assessment

## 2018-02-27 NOTE — Anesthesia Postprocedure Evaluation (Signed)
Anesthesia Post Note  Patient: Warren [name]  Procedure(s) Performed: REMOVAL INFECTED ARTIFICIAL URINARY SPHINCTER (N/A ) CYSTOSCOPY (N/A )     Patient location during evaluation: PACU Anesthesia Type: General Level of consciousness: awake and alert Pain management: pain level controlled Vital Signs Assessment: post-procedure vital signs reviewed and stable Respiratory status: spontaneous breathing, nonlabored ventilation, respiratory function stable and patient connected to nasal cannula oxygen Cardiovascular status: blood pressure returned to baseline and stable Postop Assessment: no apparent nausea or vomiting Anesthetic complications: no    Last Vitals:  Vitals:   02/27/18 1200 02/27/18 1215  BP: (!) 146/93 133/82  Pulse: 86 78  Resp: 18 17  Temp:  37 C  SpO2: 95% 95%    Last Pain:  Vitals:   02/27/18 1215  TempSrc:   PainSc: 0-No pain                 Tiajuana Amass

## 2018-02-27 NOTE — Op Note (Signed)
Preoperative diagnosis: Infected artificial urinary sphincter Postoperative diagnosis: Infected artificial urinary sphincter Surgery: Removal of infected artificial urinary sphincter and cystoscopy Surgeon: Dr. Nicki Reaper Mcdiarmid Assistant Dr. Dutch Gray  The patient is above diagnosis and consent of the above procedure.  He was on appropriate antibiotics.  Extra care was taken back with leg positioning to minimize the risk of compartment syndrome and neuropathy and deep vein thrombosis.  You could see mild redness in the right lower quadrant and suprapubic area close to the midline.  There is some induration in the scrotum and perineum.  There is some swelling as well  I remove the Foley catheter.  I performed cystoscopy.  The cuff was coapting the urethra but there was an erosion at 5:00 o'clock approximately 4 to 5 mm in length.  Foley catheter was removed and a sterile catheter was reapplied  I made a lengthy perineal incision extended to the scrotum a few centimeters  There was a watery tissue and basically no healthy tissue planes throughout.  I finger dissected and use cutting and cautery to mobilize tissues and come down onto the palpable sphincter.  I opened the tissue exposing the cuff which was easy to expose.  I used up and down and left to right incisions to mobilize it nicely.  I disengaged the tab and cut the tubing allowing me to easily remove the cuff.  I cut the tubing after placing a Claiborne Billings.  I did not try to close the urethra  The pump was indurated and thickened and quite high riding so I left it at that point in time and went above  The right leg was brought down minimally.  I made a lengthy right lower quadrant incision.  Once again the tissues were very inflamed and indurated without great tissue planes.  It was easy to expose tubing  with cutting current cautery and finger dissection.  With the help of my assistant we carefully dissected down along the tubing towards the  reservoir.  The tubing was cut.  The reservoir was emptied.  It was easily delivered.  There was a pseudocapsule around the reservoir that was initially full and there was no peritoneal entrance.  Based upon findings I finger dissected to the right hemiscrotum but the tissues again around the tubing were very thick and indurated  I thought it was best to remove the pump transperoneally.  I lengthened the incision another centimeter.  I dissected down through appropriate inflamed tissue on to the palpable pump.  I opened the tissue over its tip.  I put it on traction and cut the tubing.  A pseudocapsule was present.  The tubing from above was then easily removed.  All of the artificial sphincter was removed.  Hemostasis was excellent.  I used 3 interrupted 3-0 Vicryls to gently close some of the deeper layers and make certain his left testicle was tucked upward.  I used 2 interrupted 3-0 chromic for the scrotal skin inferiorly.  A wet-to-dry dressing was applied.  After excellent hemostasis in the right lower quadrant incision a wet-to-dry dressing was applied.  Leg position was excellent.  Urine output was good urine was clear  I believe the patient has an excellent prognosis and will be followed as per protocol

## 2018-02-27 NOTE — Progress Notes (Signed)
Patient ID: DODD SCHMID, male   DOB: [birth year], 84 y.o.   MRN: [id number]  PROGRESS NOTE   Warren [name]  URK:[id number] DOB: 07-Oct-1922 DOA: 02/26/2018 PCP: [name], MD   Brief Narrative:  Warren [name] is a 84 y.o. male with medical history significant for HTN, also history of coronary artery, carotid artery disease and emphysema admitted yesterday from another hospital for infected artificial urinary sphincter, he had urological procedure this morning for removal of the artificial urinary sphincter and cystoscopy (By Dr. Nicki Reaper Mcdiarmid). Please refer to Op Note for details.  Assessment & Plan:   Active Problems:   UTI (urinary tract infection)  1. Infected Artificial Urinary Sphincter: S/P Removal of Artificial Sphincter, specimen sent to lab for culture and sensitivity and repeat blood culture. Patient is on antibiotics Meropenem, will continue until further results are available. Patient is clinically and hemodynamically stable.  Resume PO Intake  2. Hx of CAD: Stable. Continue home medications  3. Anxiety: Continue Ativan nightly, continue Celexa  4. Hypokalemia: Replete   DVT prophylaxis: Lovenox Code Status: Full Code Family Communication: Discussed with patient and family members Disposition Plan: Not yet ready for Discharge  Consultants:   Urology  Procedures:   Cystoscopy and Removal of Infected Urinary Sphincter  Antimicrobials:   IV Meropenem  Subjective:  Patient had procedure to remove the infected urinary sphincter today, doing well post op. Patient denies any major pain, no excessive bleeding. Discussed with Dr. Dutch Gray, surgery went well. Will await culture result. No Fevr, patient feels comfortable. Daughter and son-in-law at bedside.   Objective: Vitals:   02/27/18 1200 02/27/18 1215 02/27/18 1238 02/27/18 1344  BP: (!) 146/93 133/82 (!) 141/81 (!) 143/87  Pulse: 86 78 81 79  Resp: 18 17  20   Temp:  98.6 F (37  C) 98.1 F (36.7 C)   TempSrc:   Oral   SpO2: 95% 95% 96% 98%  Weight:      Height:        Intake/Output Summary (Last 24 hours) at 02/27/2018 1548 Last data filed at 02/27/2018 1215 Gross per 24 hour  Intake 1050 ml  Output 820 ml  Net 230 ml   Filed Weights   02/26/18 1800  Weight: 79.6 kg (175 lb 7.8 oz)    Examination:  General exam: Appears calm and comfortable  Respiratory system: Clear to auscultation. Respiratory effort normal. Cardiovascular system: S1 & S2 heard, RRR. No JVD, murmurs, rubs, gallops or clicks. No pedal edema. Gastrointestinal system: Abdomen is nondistended, soft and nontender. No organomegaly or masses felt. Normal bowel sounds heard. Central nervous system: Alert and oriented. No focal neurological deficits. Extremities: Symmetric 5 x 5 power. Skin: No rashes, lesions or ulcers Psychiatry: Judgement and insight appear normal. Mood & affect appropriate.  Perineum: Dressing slightly soaked with serosanguinous fluid, patient just out of surgery.  Data Reviewed: I have personally reviewed following labs and imaging studies  CBC: Recent Labs  Lab 02/26/18 2030  WBC 11.3*  HGB 14.5  HCT 43.0  MCV 92.9  PLT 831   Basic Metabolic Panel: Recent Labs  Lab 02/26/18 2030  NA 137  K 3.2*  CL 96*  CO2 29  GLUCOSE 162*  BUN 9  CREATININE 0.81  CALCIUM 9.0   GFR: Estimated Creatinine Clearance: 57.6 mL/min (by C-G formula based on SCr of 0.81 mg/dL). Liver Function Tests: Recent Labs  Lab 02/26/18 2030  AST 17  ALT 15*  ALKPHOS 40  BILITOT  0.7  PROT 6.4*  ALBUMIN 3.0*    Recent Results (from the past 240 hour(s))  Surgical pcr screen     Status: None   Collection Time: 02/27/18 12:47 AM  Result Value Ref Range Status   MRSA, PCR NEGATIVE NEGATIVE Final   Staphylococcus aureus NEGATIVE NEGATIVE Final    Comment: (NOTE) The Xpert SA Assay (FDA approved for NASAL specimens in patients 21 years of age and older), is one component  of a comprehensive surveillance program. It is not intended to diagnose infection nor to guide or monitor treatment. Performed at [facility], [place] [address] [place], [place] [address]      Radiology Studies: No results found.  Scheduled Meds: . citalopram  20 mg Oral Daily  . LORazepam  1 mg Oral QHS   Continuous Infusions: . meropenem (MERREM) IV Stopped (02/27/18 0830)    LOS: 1 day   Time spent: 50 minutes  Angelica Chessman, MD, FACP, CPE  [facility] If 7PM-7AM, please contact night-coverage [email or web address] Password [facility] 02/27/2018, 3:48 PM

## 2018-02-28 ENCOUNTER — Encounter (HOSPITAL_COMMUNITY): Payer: Self-pay | Admitting: Urology

## 2018-02-28 LAB — COMPREHENSIVE METABOLIC PANEL
ALBUMIN: 2.5 g/dL — AB (ref 3.5–5.0)
ALK PHOS: 31 U/L — AB (ref 38–126)
ALT: 11 U/L — AB (ref 17–63)
AST: 11 U/L — ABNORMAL LOW (ref 15–41)
Anion gap: 11 (ref 5–15)
BILIRUBIN TOTAL: 0.5 mg/dL (ref 0.3–1.2)
BUN: 13 mg/dL (ref 6–20)
CALCIUM: 8.6 mg/dL — AB (ref 8.9–10.3)
CO2: 30 mmol/L (ref 22–32)
CREATININE: 0.73 mg/dL (ref 0.61–1.24)
Chloride: 96 mmol/L — ABNORMAL LOW (ref 101–111)
GFR calc Af Amer: >60 mL/min (ref >60)
GFR calc non Af Amer: >60 mL/min (ref >60)
GLUCOSE: 141 mg/dL — AB (ref 65–99)
Potassium: 4.1 mmol/L (ref 3.5–5.1)
SODIUM: 137 mmol/L (ref 135–145)
TOTAL PROTEIN: 5.5 g/dL — AB (ref 6.5–8.1)

## 2018-02-28 LAB — CBC WITH DIFFERENTIAL/PLATELET
BASOS ABS: 0 10*3/uL (ref 0.0–0.1)
BASOS PCT: 0 %
EOS ABS: 0 10*3/uL (ref 0.0–0.7)
EOS PCT: 0 %
HCT: 36.1 % — ABNORMAL LOW (ref 39.0–52.0)
Hemoglobin: 11.6 g/dL — ABNORMAL LOW (ref 13.0–17.0)
Lymphocytes Relative: 6 %
Lymphs Abs: 0.6 10*3/uL — ABNORMAL LOW (ref 0.7–4.0)
MCH: 30.3 pg (ref 26.0–34.0)
MCHC: 32.1 g/dL (ref 30.0–36.0)
MCV: 94.3 fL (ref 78.0–100.0)
MONO ABS: 0.6 10*3/uL (ref 0.1–1.0)
MONOS PCT: 6 %
Neutro Abs: 8.6 10*3/uL — ABNORMAL HIGH (ref 1.7–7.7)
Neutrophils Relative %: 88 %
PLATELETS: 267 10*3/uL (ref 150–400)
RBC: 3.83 MIL/uL — ABNORMAL LOW (ref 4.22–5.81)
RDW: 13.7 % (ref 11.5–15.5)
WBC: 9.8 10*3/uL (ref 4.0–10.5)

## 2018-02-28 MED ORDER — BISACODYL 5 MG PO TBEC
10.0000 mg | DELAYED_RELEASE_TABLET | Freq: Every day | ORAL | Status: DC | PRN
  Administered 2018-02-28: 10 mg via ORAL
  Filled 2018-02-28: qty 2

## 2018-02-28 NOTE — Progress Notes (Signed)
Patient ID: Warren [name], male   DOB: [birth year], 84 y.o.   MRN: [id number]  1 Day Post-Op Subjective: Pt feeling fine.  No complaints.  Denies pain.  No fever overnight.  Objective: Vital signs in last 24 hours: Temp:  [98 F (36.7 C)-98.7 F (37.1 C)] 98.7 F (37.1 C) (04/28 0458) Pulse Rate:  [71-92] 71 (04/28 0458) Resp:  [15-20] 16 (04/28 0457) BP: (109-156)/(66-93) 109/79 (04/28 0457) SpO2:  [91 %-100 %] 91 % (04/28 0458)  Intake/Output from previous day: 04/27 0701 - 04/28 0700 In: 2070 [P.O.:820; I.V.:950; IV Piggyback:300] Out: 1570 [Urine:1520; Blood:50] Intake/Output this shift: No intake/output data recorded.  Physical Exam:  General: Alert and oriented Abd: Incision is clean without erythema. GU: Incision packed without surrounding erythema, urine clear.  Lab Results: Recent Labs    02/26/18 2030 02/28/18 0511  HGB 14.5 11.6*  HCT 43.0 36.1*   CBC Latest Ref Rng & Units 02/28/2018 02/26/2018 02/05/2010  WBC 4.0 - 10.5 K/uL 9.8 11.3(H) 5.6  Hemoglobin 13.0 - 17.0 g/dL 11.6(L) 14.5 15.9  Hematocrit 39.0 - 52.0 % 36.1(L) 43.0 46.9  Platelets 150 - 400 K/uL 267 228 149(L)     BMET Recent Labs    02/26/18 2030 02/28/18 0511  NA 137 137  K 3.2* 4.1  CL 96* 96*  CO2 29 30  GLUCOSE 162* 141*  BUN 9 13  CREATININE 0.81 0.73  CALCIUM 9.0 8.6*     Studies/Results: Cultures from OR - GS negative and awaiting final culture results.  Assessment/Plan: S/P removal of AUS due to infection - Wet to dry dressings BID - Continue broad spectrum IV antibiotics today (final cultures still pending)   LOS: 2 days   Gaetana Kawahara,LES 02/28/2018, 9:48 AM

## 2018-02-28 NOTE — Plan of Care (Signed)
Patient without complaint 7 a to 7 p  shift, medicated for pain prior to dressing change but otherwise patient denies any pain.  Multiple family members at bedside.  Up to chair during this shift.

## 2018-02-28 NOTE — Progress Notes (Signed)
Patient ID: Warren [name], male   DOB: 11-17-1921, 84 y.o.   MRN: [id number]  PROGRESS NOTE   MARCIANO MUNDT  WNU:[id number] DOB: 1922/09/20 DOA: 02/26/2018 PCP: [name], MD   Subjective:  Patient is day 1 post op (Removal of Infected Artificial Urinary Sphincter). Patient has no new complaint, doing much better today.   Assessment & Plan:   Active Problems:   UTI (urinary tract infection)  1. Infected Artificial Urinary Sphincter: S/P Removal of Artificial Sphincter, Day 1 Post-Op, tissue cultures are negative so far. Will continue IV Meropenem Patient is clinically and hemodynamically stable.   2. Hx of CAD: Stable. Continue home medications  3. Anxiety: Continue Ativan nightly, continue Celexa  4. Hypokalemia: Corrected.  5. Discharge Planning: Discussed with family, they all agreed that patient be discharged to an extended care facility since patient lives alone and has need for wound dressing at least daily. They will prefer [facility] because the facility is close to the family members. Social Work and Care management on consult for discharge planning.  DVT prophylaxis: Lovenox  Code Status: Full Code  Family Communication: Discussed with patient and family members. The family prefers that patient is discharged to [facility] when discharged.   Disposition Plan: Not yet ready for Discharge  Consultants:   Urology  Procedures:   S/P Removal of infected sphincter  Antimicrobials:   IV Meropenem  Objective: Vitals:   02/27/18 1828 02/27/18 2039 02/28/18 0457 02/28/18 0458  BP:  113/66 109/79   Pulse:  73 72 71  Resp:  16 16   Temp:  98.2 F (36.8 C)  98.7 F (37.1 C)  TempSrc:  Oral    SpO2: 94% 92% 91% 91%  Weight:      Height:        Intake/Output Summary (Last 24 hours) at 02/28/2018 1317 Last data filed at 02/28/2018 0400 Gross per 24 hour  Intake 1020 ml  Output 750 ml  Net 270 ml   Filed Weights   02/26/18 1800  Weight:  79.6 kg (175 lb 7.8 oz)   Examination:  General exam: Appears calm and comfortable, pleasant, elderly Respiratory system: Clear to auscultation. Respiratory effort normal. Cardiovascular system: S1 & S2 heard, RRR. No JVD, murmurs, rubs, gallops or clicks. No pedal edema. Gastrointestinal system: Abdomen is nondistended, soft and nontender. No organomegaly or masses felt. Normal bowel sounds heard. Perineal dressing slightly soaked with serosanguinous fluid. Central nervous system: Alert and oriented. No focal neurological deficits. Extremities: Symmetric 5 x 5 power. Skin: No rashes, lesions or ulcers Psychiatry: Judgement and insight appear normal. Mood & affect appropriate.   Data Reviewed: I have personally reviewed following labs and imaging studies  CBC: Recent Labs  Lab 02/26/18 2030 02/28/18 0511  WBC 11.3* 9.8  NEUTROABS  --  8.6*  HGB 14.5 11.6*  HCT 43.0 36.1*  MCV 92.9 94.3  PLT 228 034   Basic Metabolic Panel: Recent Labs  Lab 02/26/18 2030 02/28/18 0511  NA 137 137  K 3.2* 4.1  CL 96* 96*  CO2 29 30  GLUCOSE 162* 141*  BUN 9 13  CREATININE 0.81 0.73  CALCIUM 9.0 8.6*   GFR: Estimated Creatinine Clearance: 58.3 mL/min (by C-G formula based on SCr of 0.73 mg/dL). Liver Function Tests: Recent Labs  Lab 02/26/18 2030 02/28/18 0511  AST 17 11*  ALT 15* 11*  ALKPHOS 40 31*  BILITOT 0.7 0.5  PROT 6.4* 5.5*  ALBUMIN 3.0* 2.5*  No results for input(s): LIPASE, AMYLASE in the last 168 hours. No results for input(s): AMMONIA in the last 168 hours. Coagulation Profile: No results for input(s): INR, PROTIME in the last 168 hours. Cardiac Enzymes: No results for input(s): CKTOTAL, CKMB, CKMBINDEX, TROPONINI in the last 168 hours. BNP (last 3 results) No results for input(s): PROBNP in the last 8760 hours. HbA1C: No results for input(s): HGBA1C in the last 72 hours. CBG: No results for input(s): GLUCAP in the last 168 hours. Lipid Profile: No  results for input(s): CHOL, HDL, LDLCALC, TRIG, CHOLHDL, LDLDIRECT in the last 72 hours. Thyroid Function Tests: No results for input(s): TSH, T4TOTAL, FREET4, T3FREE, THYROIDAB in the last 72 hours. Anemia Panel: No results for input(s): VITAMINB12, FOLATE, FERRITIN, TIBC, IRON, RETICCTPCT in the last 72 hours. Sepsis Labs: No results for input(s): PROCALCITON, LATICACIDVEN in the last 168 hours.  Recent Results (from the past 240 hour(s))  Surgical pcr screen     Status: None   Collection Time: 02/27/18 12:47 AM  Result Value Ref Range Status   MRSA, PCR NEGATIVE NEGATIVE Final   Staphylococcus aureus NEGATIVE NEGATIVE Final    Comment: (NOTE) The Xpert SA Assay (FDA approved for NASAL specimens in patients 18 years of age and older), is one component of a comprehensive surveillance program. It is not intended to diagnose infection nor to guide or monitor treatment. Performed at [facility], [place] [address], [place], [place] [address]   Aerobic/Anaerobic Culture (surgical/deep wound)     Status: None (Preliminary result)   Collection Time: 02/27/18 11:16 AM  Result Value Ref Range Status   Specimen Description   Final    TISSUE Performed at [facility] [address], [place], [place] [address]    Special Requests   Final    NONE Performed at [facility], [place] [address] [place], [place] [address]    Gram Stain   Final    ABUNDANT WBC PRESENT, PREDOMINANTLY PMN NO ORGANISMS SEEN    Culture   Final    NO GROWTH 1 DAY Performed at [facility] [address] [place],  [address]    Report Status PENDING  Incomplete     Radiology Studies: No results found.  Scheduled Meds: . citalopram  20 mg Oral Daily  . LORazepam  1 mg Oral QHS  . potassium chloride  20 mEq Oral BID   Continuous Infusions: . meropenem (MERREM) IV Stopped (02/28/18 0950)     LOS: 2 days   Time spent: 50  minutes  Angelica Chessman, MD, FACP, CPE  [facility] If 7PM-7AM, please contact night-coverage [email or web address] Password [email or web address] 02/28/2018, 1:17 PM

## 2018-03-01 DIAGNOSIS — N390 Urinary tract infection, site not specified: Secondary | ICD-10-CM

## 2018-03-01 DIAGNOSIS — T8579XA Infection and inflammatory reaction due to other internal prosthetic devices, implants and grafts, initial encounter: Secondary | ICD-10-CM

## 2018-03-01 LAB — COMPREHENSIVE METABOLIC PANEL
ALBUMIN: 2.6 g/dL — AB (ref 3.5–5.0)
ALK PHOS: 33 U/L — AB (ref 38–126)
ALT: 16 U/L — ABNORMAL LOW (ref 17–63)
AST: 19 U/L (ref 15–41)
Anion gap: 8 (ref 5–15)
BILIRUBIN TOTAL: 0.4 mg/dL (ref 0.3–1.2)
BUN: 16 mg/dL (ref 6–20)
CALCIUM: 8.7 mg/dL — AB (ref 8.9–10.3)
CO2: 32 mmol/L (ref 22–32)
Chloride: 98 mmol/L — ABNORMAL LOW (ref 101–111)
Creatinine, Ser: 0.63 mg/dL (ref 0.61–1.24)
GFR calc Af Amer: >60 mL/min (ref >60)
GFR calc non Af Amer: >60 mL/min (ref >60)
GLUCOSE: 115 mg/dL — AB (ref 65–99)
Potassium: 4.1 mmol/L (ref 3.5–5.1)
Sodium: 138 mmol/L (ref 135–145)
TOTAL PROTEIN: 5.6 g/dL — AB (ref 6.5–8.1)

## 2018-03-01 LAB — CBC WITH DIFFERENTIAL/PLATELET
BASOS ABS: 0 10*3/uL (ref 0.0–0.1)
Basophils Relative: 0 %
EOS ABS: 0.2 10*3/uL (ref 0.0–0.7)
Eosinophils Relative: 2 %
HCT: 36.4 % — ABNORMAL LOW (ref 39.0–52.0)
Hemoglobin: 11.6 g/dL — ABNORMAL LOW (ref 13.0–17.0)
LYMPHS ABS: 1.5 10*3/uL (ref 0.7–4.0)
Lymphocytes Relative: 17 %
MCH: 30.2 pg (ref 26.0–34.0)
MCHC: 31.9 g/dL (ref 30.0–36.0)
MCV: 94.8 fL (ref 78.0–100.0)
Monocytes Absolute: 0.8 10*3/uL (ref 0.1–1.0)
Monocytes Relative: 9 %
NEUTROS ABS: 6.3 10*3/uL (ref 1.7–7.7)
Neutrophils Relative %: 72 %
Platelets: 257 10*3/uL (ref 150–400)
RBC: 3.84 MIL/uL — ABNORMAL LOW (ref 4.22–5.81)
RDW: 13.7 % (ref 11.5–15.5)
WBC: 8.8 10*3/uL (ref 4.0–10.5)

## 2018-03-01 MED ORDER — MIRABEGRON ER 25 MG PO TB24
50.0000 mg | ORAL_TABLET | Freq: Every day | ORAL | Status: DC
  Administered 2018-03-01 – 2018-03-03 (×3): 50 mg via ORAL
  Filled 2018-03-01 (×3): qty 2

## 2018-03-01 MED FILL — Morphine Sulfate Inj 10 MG/ML: INTRAMUSCULAR | Qty: 1 | Status: AC

## 2018-03-01 NOTE — Clinical Social Work Note (Signed)
Clinical Social Work Assessment  Patient Details  Name: Warren [name] MRN: [id number] Date of Birth: 1922-08-14  Date of referral:  03/01/18               Reason for consult:  Facility Placement                Permission sought to share information with:  Family Supports Permission granted to share information::  Yes, Verbal Permission Granted  Name::     [name], [name]  Agency::     Relationship::     Contact Information:     Housing/Transportation Living arrangements for the past 2 months:  40, Charity fundraiser of Information:  Patient, Adult Children Patient Interpreter Needed:  None Criminal Activity/Legal Involvement Pertinent to Current Situation/Hospitalization:  No - Comment as needed Significant Relationships:  Friend, [employer] Lives with:  Self, Facility Resident Do you feel safe going back to the place where you live?  Yes Need for family participation in patient care:  Yes (Comment)(children assisting)  Care giving concerns:  Pt admitted from home where he resides in independent living apartment in [facility] in [place]. Pt uses walker at baseline- showers, eats, completes ADLs independently. Admitted for removal of infected artificil urinary sphincter. 2 days post op   Facilities manager / plan:  CSW consulted to assist with potential SNF placement. Met with pt and his son at bedside- they stated due to noticing pt weaker than at baseline and also having surgical wounds they are questioning if pt needs to go to SNF for ST rehab.  CSW explained SNF placement process- PT pending (per chart PT attempted today and family requested eval later). CSW explained that PT eval will be needed for SNF referrals to see what level of care pt currently needing when considering mobility/therapy needs. Expressed understanding. Submitted PASRR- went to manual review. Will send additional information to PASRR when  requested- PT will need approved PASRR prior to admitting to a SNF. Pt's family preference is for [facility] if available- made referrals there and to other area SNFs.  Plan: SNF for ST rehab Barriers: medical stability for DC, pt participating in PT eval, PASRR, bed offers.   Employment status:   retired Forensic scientist:   Medicare PT Recommendations:   pending Information / Referral to community resources:  SNF Patient/Family's Response to care:  appreciative  Patient/Family's Understanding of and Emotional Response to Diagnosis, Current Treatment, and Prognosis:  Both pt and son demonstrate good understanding of treatment thus far and potential plan. States, "the goal would be to get some help with therapy and the wound care so he can go back to his apartment"  Emotional Assessment Appearance:  Appears stated age Attitude/Demeanor/Rapport:  Engaged Affect (typically observed):  Accepting Orientation:  Oriented to Self, Oriented to Place, Oriented to  Time, Oriented to Situation Alcohol / Substance use:  Not Applicable Psych involvement (Current and /or in the community):  No (Comment)  Discharge Needs  Concerns to be addressed:  Discharge Planning Concerns Readmission within the last 30 days:  No Current discharge risk:  Dependent with Mobility Barriers to Discharge:  Continued Medical Work up   [employer], LCSW 03/01/2018, 2:17 PM  [phone] coverage for [phone]

## 2018-03-01 NOTE — Care Management Important Message (Signed)
Important Message  Patient Details  Name: BREYER TEJERA MRN: [id number] Date of Birth: 05-06-1922   Medicare Important Message Given:  Yes    Kerin Salen 03/01/2018, 11:26 AMImportant Message  Patient Details  Name: DEMTRIUS ROUNDS MRN: [id number] Date of Birth: [birth year]   Medicare Important Message Given:  Yes    Kerin Salen 03/01/2018, 11:26 AM

## 2018-03-01 NOTE — Progress Notes (Signed)
Wounds look good Vitals good I will double check referring notes but c/s from [place] sensitive to cipro ? Go home to [facility] on wed or thurs for wound care Follow as outpt Leave foley in

## 2018-03-01 NOTE — Progress Notes (Signed)
  PROGRESS NOTE  CLEVELAND YARBRO KZS:[id number] DOB: [birth year] DOA: 02/26/2018 PCP: [name], MD  Brief Narrative: 84 year old man PMH essential hypertension, hospitalized 4/19-4/26 at [facility] for UTI greater than 100,000 colonies Pseudomonas, sensitive to ciprofloxacin.  Imaging revealed concern for infected artificial urinary sphincter in this patient was transferred to [facility] long for urology evaluation.  Assessment/Plan Infected artificial urinary sphincter.  Status post removal 4/27. --Continue empiric antibiotics, follow-up culture data --Wet-to-dry dressings twice daily per urology  Pseudomonas UTI by report at previous facility. --Continue meropenem   Appears stable, anticipate discharge 4/30 if culture result  DVT prophylaxis: SCDs Code Status: full Family Communication:  Disposition Plan: pending PT evaluation    Murray Hodgkins, MD  [facility] Direct contact: [phone] --[place]ia [place]  --[email or web address]; password [email or web address]  7PM-7AM contact night coverage as above 03/01/2018, 3:18 PM  LOS: 3 days   Consultants:  Urology   Procedures:  4/27 Removal of infected artificial urinary sphincter and cystoscopy  Antimicrobials:  Meropenem 4/27 >>  Interval history/Subjective: Feels well, no complaints except mild cough.  Objective: Vitals:  Vitals:   02/28/18 2046 03/01/18 0512  BP: 125/73 131/78  Pulse: 78 71  Resp: 18 18  Temp: 98.7 F (37.1 C) 98.8 F (37.1 C)  SpO2: 93% 94%    Exam:  Constitutional:  . Appears calm and comfortable Respiratory:  . CTA bilaterally, no w/r/r.  . Respiratory effort normal. Cardiovascular:  . RRR, no m/r/g . No LE extremity edema   Skin:  . Wound right lower abdomen packed. Psychiatric:  . Mental status o Mood, affect appropriate  I have personally reviewed the following:   Labs:  Complete metabolic panel unremarkable  CBC unremarkable   Scheduled Meds: . citalopram  20 mg Oral  Daily  . LORazepam  1 mg Oral QHS  . mirabegron ER  50 mg Oral Daily  . potassium chloride  20 mEq Oral BID   Continuous Infusions: . meropenem (MERREM) IV Stopped (03/01/18 1005)    Principal Problem:   Internal device, implant, or graft infection or inflammation (HCC) Active Problems:   Acute lower UTI   LOS: 3 days

## 2018-03-01 NOTE — Evaluation (Signed)
Physical Therapy Evaluation Patient Details Name: Warren [name] MRN: [id number] DOB: Sep 28, 1922 Today's Date: 03/01/2018   History of Present Illness  84 yo male admitted with pseudomonas UTI, infected urinary prosthesis. S/P removal artificial sphincter, cystoscopy 4/27  Clinical Impression  On eval, pt required Min assist for mobility. He walked ~75 feet with a RW. Pt presents with general weakness, decreased activity tolerance, and impaired gait and balance. Pt denied pain during session. Discussed d/c plan with pt and daughter-feel ST rehab is most appropriate option. Recommend ST rehab. Will follow and progress activity as tolerated.     Follow Up Recommendations SNF    Equipment Recommendations  None recommended by PT    Recommendations for Other Services       Precautions / Restrictions Precautions Precautions: Fall Restrictions Weight Bearing Restrictions: No      Mobility  Bed Mobility Overal bed mobility: Needs Assistance Bed Mobility: Supine to Sit     Supine to sit: Min guard;HOB elevated     General bed mobility comments: close guard for safety. Increased time. LOB posteriorly x 2.   Transfers Overall transfer level: Needs assistance Equipment used: Rolling walker (2 wheeled) Transfers: Sit to/from Stand Sit to Stand: Min assist;From elevated surface         General transfer comment: Assist to rise, stabilize, control descent. VCs safety, hand placement.   Ambulation/Gait Ambulation/Gait assistance: Min assist Ambulation Distance (Feet): 50 Feet Assistive device: Rolling walker (2 wheeled) Gait Pattern/deviations: Step-through pattern;Trunk flexed     General Gait Details: Assist to stabilize pt and maneuver with walker. Cues for safety, distance from walker.   Stairs            Wheelchair Mobility    Modified Rankin (Stroke Patients Only)       Balance Overall balance assessment: Needs assistance         Standing  balance support: Bilateral upper extremity supported Standing balance-Leahy Scale: Poor                               Pertinent Vitals/Pain Pain Assessment: No/denies pain    Home Living Family/patient expects to be discharged to:: Skilled nursing facility     Type of Home: Independent living facility Home Access: Level entry     Home Layout: One level Home Equipment: Environmental consultant - 2 wheels      Prior Function Level of Independence: Independent               Hand Dominance        Extremity/Trunk Assessment   Upper Extremity Assessment Upper Extremity Assessment: Generalized weakness    Lower Extremity Assessment Lower Extremity Assessment: Generalized weakness    Cervical / Trunk Assessment Cervical / Trunk Assessment: Kyphotic  Communication   Communication: No difficulties  Cognition Arousal/Alertness: Awake/alert Behavior During Therapy: WFL for tasks assessed/performed Overall Cognitive Status: Within Functional Limits for tasks assessed                                        General Comments      Exercises     Assessment/Plan    PT Assessment Patient needs continued PT services  PT Problem List Decreased strength;Decreased balance;Decreased mobility;Decreased activity tolerance;Decreased knowledge of use of DME       PT Treatment Interventions DME instruction;Gait training;Functional mobility training;Balance training;Therapeutic  activities;Patient/family education;Therapeutic exercise    PT Goals (Current goals can be found in the Care Plan section)  Acute Rehab PT Goals Patient Stated Goal: to get stronger and return to ind living PT Goal Formulation: With patient Time For Goal Achievement: 03/15/18 Potential to Achieve Goals: Good    Frequency Min 3X/week   Barriers to discharge        Co-evaluation               AM-PAC PT "6 Clicks" Daily Activity  Outcome Measure Difficulty turning over in bed  (including adjusting bedclothes, sheets and blankets)?: A Little Difficulty moving from lying on back to sitting on the side of the bed? : A Little Difficulty sitting down on and standing up from a chair with arms (e.g., wheelchair, bedside commode, etc,.)?: Unable Help needed moving to and from a bed to chair (including a wheelchair)?: A Little Help needed walking in hospital room?: A Little Help needed climbing 3-5 steps with a railing? : A Little 6 Click Score: 16    End of Session Equipment Utilized During Treatment: Gait belt Activity Tolerance: Patient tolerated treatment well Patient left: in chair;with call bell/phone within reach;with chair alarm set;with family/visitor present   PT Visit Diagnosis: Muscle weakness (generalized) (M62.81);Difficulty in walking, not elsewhere classified (R26.2)    Time: 1511-1530 PT Time Calculation (min) (ACUTE ONLY): 19 min   Charges:   PT Evaluation $PT Eval Moderate Complexity: 1 Mod     PT G Codes:          Weston Anna, MPT Pager: [phone]

## 2018-03-01 NOTE — Progress Notes (Signed)
PT Cancellation Note  Patient Details Name: MADDON HORTON MRN: [id number] DOB: 1921/12/09   Cancelled Treatment:    Reason Eval/Treat Not Completed: Attempted PT eval. Family requested we check back later. Will check back as schedule allows. Thanks.    Weston Anna, MPT Pager: [phone]

## 2018-03-01 NOTE — NC FL2 (Signed)
[place] LEVEL OF CARE SCREENING TOOL     IDENTIFICATION  Patient Name: Warren [name] Birthdate: Jul 12, 1922 Sex: male Admission Date (Current Location): 02/26/2018  [facility] and [place] Number:  Engineer, manufacturing systems and Address:  [facility],  [address], [place]      Provider Number: [id number]  Attending Physician Name and Address:  Samuella Cota, MD  Relative Name and Phone Number:       Current Level of Care: Hospital Recommended Level of Care: [place] Prior Approval Number:    Date Approved/Denied:   PASRR Number: pending  Discharge Plan: SNF    Current Diagnoses: Patient Active Problem List   Diagnosis Date Noted  . UTI (urinary tract infection) 02/26/2018  . Emphysema   . Thyroid nodule   . Carotid artery disease ([place])   . CAD (coronary artery disease)   . Edema extremities   . Claudication ([place]) 07/30/2011  . DYSLIPIDEMIA 07/03/2010  . HYPERTENSION, BENIGN 07/03/2010  . PERIPHERAL EDEMA 07/03/2010  . SHORTNESS OF BREATH 07/03/2010  . PRECORDIAL PAIN 07/03/2010    Orientation RESPIRATION BLADDER Height & Weight     Self, Time, Situation, Place  Normal Continent, Indwelling catheter Weight: 175 lb 7.8 oz (79.6 kg) Height:  5\' 7"  (170.2 cm)  BEHAVIORAL SYMPTOMS/MOOD NEUROLOGICAL BOWEL NUTRITION STATUS      Continent Diet(regular)  AMBULATORY STATUS COMMUNICATION OF NEEDS Skin   Extensive Assist Verbally Surgical wounds(closed incision perineum, mesh briefs, moist to dry dressing changes, closed incision abdomen, moist to dry dressing changes )                       Personal Care Assistance Level of Assistance  Bathing, Feeding, Dressing Bathing Assistance: Limited assistance Feeding assistance: Independent Dressing Assistance: Independent     Functional Limitations Info  Sight, Speech, Hearing Sight Info: Adequate Hearing Info: Impaired(hard of hearing) Speech Info:  Adequate    SPECIAL CARE FACTORS FREQUENCY  PT (By licensed PT), OT (By licensed OT)     PT Frequency: 5x OT Frequency: 5x            Contractures Contractures Info: Not present    Additional Factors Info  Code Status, Allergies Code Status Info: full code Allergies Info: aspirin, latex, penicillins           Current Medications (03/01/2018):  This is the current hospital active medication list Current Facility-Administered Medications  Medication Dose Route Frequency Provider Last Rate Last Dose  . bisacodyl (DULCOLAX) EC tablet 10 mg  10 mg Oral Daily PRN Tresa Garter, MD   10 mg at 02/28/18 1523  . citalopram (CELEXA) tablet 20 mg  20 mg Oral Daily Bjorn Loser, MD   20 mg at 03/01/18 0935  . HYDROcodone-acetaminophen (NORCO/VICODIN) 5-325 MG per tablet 1-2 tablet  1-2 tablet Oral Q6H PRN Bjorn Loser, MD   2 tablet at 03/01/18 0517  . LORazepam (ATIVAN) tablet 1 mg  1 mg Oral QHS Bjorn Loser, MD   1 mg at 02/28/18 2104  . meropenem (MERREM) 1 g in sodium chloride 0.9 % 100 mL IVPB  1 g Intravenous Q8H Bjorn Loser, MD   Stopped at 03/01/18 1005  . morphine 4 MG/ML injection 1 mg  1 mg Intravenous Q1H PRN Berton Mount, RPH   1 mg at 02/27/18 1316  . ondansetron (ZOFRAN) tablet 4 mg  4 mg Oral Q6H PRN Bjorn Loser, MD  Or  . ondansetron (ZOFRAN) injection 4 mg  4 mg Intravenous Q6H PRN MacDiarmid, Scott, MD      . potassium chloride SA (K-DUR,KLOR-CON) CR tablet 20 mEq  20 mEq Oral BID Tresa Garter, MD   20 mEq at 03/01/18 0935     Discharge Medications: Please see discharge summary for a list of discharge medications.  Relevant Imaging Results:  Relevant Lab Results:   Additional Information SS# [id number]  Nila Nephew, LCSW

## 2018-03-01 NOTE — Progress Notes (Signed)
Pharmacy Antibiotic Note  Warren [name] is a 84 y.o. male with an artificial urinary sphincter admitted on 02/26/2018 from [facility] for management of the artificial sphincter infection. UCx positive for PsA.  Patient was started on cipro and then switched to meropenem at [facility]. To resume meropenem for UTI at [facility].  - pt's RN stated that last meropenem dose was given at [place] on 4/26  - 4/26 UCX at [facility]:  PsA (S= cipro, zosyn, merrem; R= cefepime, gent) - Infected AUS cultures pending  Plan: Day 3 Antibiotics - Continue meropenem 1gm IV q8h per current renal function - Follow cultures and narrow antibiotics when possible  _________________________________________  Height: 5\' 7"  (170.2 cm) Weight: 175 lb 7.8 oz (79.6 kg) IBW/kg (Calculated) : 66.1  Temp (24hrs), Avg:98.7 F (37.1 C), Min:98.6 F (37 C), Max:98.8 F (37.1 C)  Recent Labs  Lab 02/26/18 2030 02/28/18 0511 03/01/18 0456  WBC 11.3* 9.8 8.8  CREATININE 0.81 0.73 0.63    Estimated Creatinine Clearance: 58.3 mL/min (by C-G formula based on SCr of 0.63 mg/dL).    Allergies  Allergen Reactions  . Aspirin     REACTION: SPOTS REACTION: SPOTS  . Latex     unknown  . Penicillins     Has patient had a PCN reaction causing immediate rash, facial/tongue/throat swelling, SOB or lightheadedness with hypotension:No Has patient had a PCN reaction causing severe rash involving mucus membranes or skin necrosis: No Has patient had a PCN reaction that required hospitalization:NoHas patient had a PCN reaction occurring within the last 10 years:No If all of the above answers are "NO", then may proceed with Cephalosporin use.      Thank you for allowing pharmacy to be a part of this patient's care.  Adrian Saran, PharmD, BCPS Pager [phone] 03/01/2018 9:09 AM

## 2018-03-02 ENCOUNTER — Inpatient Hospital Stay (HOSPITAL_COMMUNITY): Payer: Medicare Other

## 2018-03-02 DIAGNOSIS — I2510 Atherosclerotic heart disease of native coronary artery without angina pectoris: Secondary | ICD-10-CM

## 2018-03-02 DIAGNOSIS — N390 Urinary tract infection, site not specified: Secondary | ICD-10-CM

## 2018-03-02 DIAGNOSIS — F329 Major depressive disorder, single episode, unspecified: Secondary | ICD-10-CM

## 2018-03-02 DIAGNOSIS — T8579XA Infection and inflammatory reaction due to other internal prosthetic devices, implants and grafts, initial encounter: Secondary | ICD-10-CM

## 2018-03-02 NOTE — Progress Notes (Signed)
PROGRESS NOTE    Warren [name]  WUJ:[id number] DOB: 1922-02-16 DOA: 02/26/2018 PCP: [name], MD   Brief Narrative:  HPI On 02/26/2018 by Dr. Jenetta Downer Warren [name] is a 84 y.o. male with medical history significant for HTN, also history of coronary artery, carotid artery disease and emphysema patient denies the later 3.  Admitted to [facility] 4/19- 4/26, with initial complaints of nausea dizziness.  Most of the history is obtained from the patient, his family and urology note.  I see imaging and lab results but I do not see a transfer summary in the physical chart.  UA was suggestive of UTI with large leukocyte esterase, Lactic acid 2.1, WBC 13 , hemoglobin 13.3 ,Patient was initially started on ciprofloxacin at [facility] for UTI, as  but with urine cultures growing >100,000 colonies of Pseudomonas, sensitive to ciprofloxacin Zosyn and meropenem, IV antibiotics were switched to meropenem 1- 2 days prior to transfer.  Chest x-ray on admission at [facility]- mild pulmonary vascular congestion otherwise no acute pulmonary disease, CT abdomen pelvis without contrast 4/19- diverticulosis without diverticulitis mild inflammatory changes in the anterior abdominal wall, some of these surrounding the tube for the ureteral prostesis, this likely represents some focal cellulitis. No definitive abscess is noted.  Patient and family report that prostesis was placed ~ 10 yrs ago, patient had done well for ~ 8 yrs, but have had some problems over the past 2 yrs. Prior to admission patient had being on low dose bactrim and then  Macrodantin for urinary problems/infection.   Urology Dr. [name], was consulted, from [facility] because of travel and short notice, patient was transferred here.  Patient has an artificial urinary sphincter.  He had catheter trauma, the device had been deactivated for several weeks and subsequently reactivated after a recheck.  Patient was  transferred here for infected urinary prosthesis.  Patient was accepted in transfer by Hospitalist Dr. Wendee Beavers.  Interim history Admitted for infected artificial urinary sphincter which was removed by urology.  Patient had culture done at outside hospital which was positive for Pseudomonas and did have to Cipro. Assessment & Plan   Infected artificial urinary sphincter -Urology consulted and appreciated, removed on 02/27/2018 -Currently on Merrem -Continue wound care with wet-to-dry dressings BID per urology -foley catheter to stay in per urology  Pseudomonas UTI -Urine culture done at outside facility -Report of urology, culture showed Pseudomonas and was sensitive to ciprofloxacin -Urology recommended ciprofloxacin for 7 days  Essential hypertension -Not on any antihypertensive medications  Coronary artery disease -Denies chest pain. -Has not been on Plavix in several months to years  Anxiety/depression -Continue Celexa  Physical deconditioning -Secondary to infection versus age -PT recommended SNF -Social work consulted, currently pending PASR  DVT Prophylaxis  SCDs  Code Status: Full  Family Communication: None at bedside  Disposition Plan: Admitted. Pending SNF placement  Consultants Urology  Procedures  Removal infected artificial urinary sphincter  Antibiotics   Anti-infectives (From admission, onward)   Start     Dose/Rate Route Frequency Ordered Stop   02/27/18 1014  polymyxin B 500,000 Units, bacitracin 50,000 Units in sodium chloride 0.9 % 500 mL irrigation  Status:  Discontinued       As needed 02/27/18 1014 02/27/18 1116   02/27/18 0100  meropenem (MERREM) 1 g in sodium chloride 0.9 % 100 mL IVPB     1 g 200 mL/hr over 30 Minutes Intravenous Every 8 hours 02/26/18 2047     02/26/18 2100  meropenem (MERREM) 1 g in sodium chloride 0.9 % 100 mL IVPB  Status:  Discontinued     1 g 200 mL/hr over 30 Minutes Intravenous Every 8 hours 02/26/18 2040 02/26/18  2047      Subjective:   [name] seen and examined today.  No complaints today.  Denies chest pain, shortness breath, abdominal pain, nausea vomiting, diarrhea or constipation.  Objective:   Vitals:   03/01/18 1537 03/01/18 1944 03/02/18 0412 03/02/18 1247  BP: (!) 146/83 135/89 (!) 150/92 (!) 141/87  Pulse: 86 88 89 90  Resp: 20 20 16 16   Temp: 99.4 F (37.4 C) 98.5 F (36.9 C) 98.9 F (37.2 C) 97.9 F (36.6 C)  TempSrc: Oral Oral Oral Oral  SpO2: 93% 96% 94% 94%  Weight:      Height:        Intake/Output Summary (Last 24 hours) at 03/02/2018 1337 Last data filed at 03/02/2018 0951 Gross per 24 hour  Intake 900 ml  Output 5495 ml  Net -4595 ml   Filed Weights   02/26/18 1800  Weight: 79.6 kg (175 lb 7.8 oz)    Exam  General: Well developed, well nourished, NAD, appears stated age  HEENT: NCAT, mucous membranes moist.   Neck: Supple  Cardiovascular: S1 S2 auscultated, no rubs, murmurs or gallops. Regular rate and rhythm.  Respiratory: Clear to auscultation bilaterally with equal chest rise  Abdomen: Soft, nontender, nondistended, + bowel sounds  Extremities: warm dry without cyanosis clubbing or edema  Neuro: AAOx3, focal  Psych: Normal affect and demeanor with intact judgement and insight   Data Reviewed: I have personally reviewed following labs and imaging studies  CBC: Recent Labs  Lab 02/26/18 2030 02/28/18 0511 03/01/18 0456  WBC 11.3* 9.8 8.8  NEUTROABS  --  8.6* 6.3  HGB 14.5 11.6* 11.6*  HCT 43.0 36.1* 36.4*  MCV 92.9 94.3 94.8  PLT 228 267 371   Basic Metabolic Panel: Recent Labs  Lab 02/26/18 2030 02/28/18 0511 03/01/18 0456  NA 137 137 138  K 3.2* 4.1 4.1  CL 96* 96* 98*  CO2 29 30 32  GLUCOSE 162* 141* 115*  BUN 9 13 16   CREATININE 0.81 0.73 0.63  CALCIUM 9.0 8.6* 8.7*   GFR: Estimated Creatinine Clearance: 58.3 mL/min (by C-G formula based on SCr of 0.63 mg/dL). Liver Function Tests: Recent Labs  Lab  02/26/18 2030 02/28/18 0511 03/01/18 0456  AST 17 11* 19  ALT 15* 11* 16*  ALKPHOS 40 31* 33*  BILITOT 0.7 0.5 0.4  PROT 6.4* 5.5* 5.6*  ALBUMIN 3.0* 2.5* 2.6*   No results for input(s): LIPASE, AMYLASE in the last 168 hours. No results for input(s): AMMONIA in the last 168 hours. Coagulation Profile: No results for input(s): INR, PROTIME in the last 168 hours. Cardiac Enzymes: No results for input(s): CKTOTAL, CKMB, CKMBINDEX, TROPONINI in the last 168 hours. BNP (last 3 results) No results for input(s): PROBNP in the last 8760 hours. HbA1C: No results for input(s): HGBA1C in the last 72 hours. CBG: No results for input(s): GLUCAP in the last 168 hours. Lipid Profile: No results for input(s): CHOL, HDL, LDLCALC, TRIG, CHOLHDL, LDLDIRECT in the last 72 hours. Thyroid Function Tests: No results for input(s): TSH, T4TOTAL, FREET4, T3FREE, THYROIDAB in the last 72 hours. Anemia Panel: No results for input(s): VITAMINB12, FOLATE, FERRITIN, TIBC, IRON, RETICCTPCT in the last 72 hours. Urine analysis:    Component Value Date/Time   COLORURINE RED (A) 11/27/2017 6967  APPEARANCEUR CLOUDY (A) 11/27/2017 0455   LABSPEC 1.015 11/27/2017 0455   PHURINE 6.0 11/27/2017 0455   GLUCOSEU 50 (A) 11/27/2017 0455   HGBUR MODERATE (A) 11/27/2017 0455   BILIRUBINUR NEGATIVE 11/27/2017 0455   KETONESUR 20 (A) 11/27/2017 0455   PROTEINUR 100 (A) 11/27/2017 0455   NITRITE POSITIVE (A) 11/27/2017 0455   LEUKOCYTESUR NEGATIVE 11/27/2017 0455   Sepsis Labs: @LABRCNTIP (procalcitonin:4,lacticidven:4)  ) Recent Results (from the past 240 hour(s))  Surgical pcr screen     Status: None   Collection Time: 02/27/18 12:47 AM  Result Value Ref Range Status   MRSA, PCR NEGATIVE NEGATIVE Final   Staphylococcus aureus NEGATIVE NEGATIVE Final    Comment: (NOTE) The Xpert SA Assay (FDA approved for NASAL specimens in patients 75 years of age and older), is one component of a  comprehensive surveillance program. It is not intended to diagnose infection nor to guide or monitor treatment. Performed at [facility], [place] [address], [place], [place] [address]   Aerobic/Anaerobic Culture (surgical/deep wound)     Status: None (Preliminary result)   Collection Time: 02/27/18 11:16 AM  Result Value Ref Range Status   Specimen Description   Final    TISSUE Performed at [facility] [address] [place], [place] [address]    Special Requests   Final    NONE Performed at [facility], [place] [address] [place], [place] [address]    Gram Stain   Final    ABUNDANT WBC PRESENT, PREDOMINANTLY PMN NO ORGANISMS SEEN    Culture   Final    NO GROWTH 3 DAYS NO ANAEROBES ISOLATED; CULTURE IN PROGRESS FOR 5 DAYS Performed at [facility] [address] [place], [place] [address]    Report Status PENDING  Incomplete      Radiology Studies: No results found.   Scheduled Meds: . citalopram  20 mg Oral Daily  . LORazepam  1 mg Oral QHS  . mirabegron ER  50 mg Oral Daily  . potassium chloride  20 mEq Oral BID   Continuous Infusions: . meropenem (MERREM) IV Stopped (03/02/18 1021)     LOS: 4 days   Time Spent in minutes   30 minutes  Gardy Montanari D.O. on 03/02/2018 at 1:37 PM  Between 7am to 7pm - Pager - [phone]  After 7pm go to [email or web address] - password [email or web address]  And look for the night coverage person covering for me after hours  [facility] Office  [phone]

## 2018-03-02 NOTE — Progress Notes (Signed)
As the patient's family was leaving this evening, they reported that the felt that the patient sounded congested and asked the RN to assess the patient. RN auscultated lung sounds and they were clear but diminished. Patient did have a congested cough. RN placed order for CXR from previous order and discussion with MD. Will continue to monitor.   Othella Boyer [facility] 03/02/2018 6:45 PM

## 2018-03-02 NOTE — Progress Notes (Signed)
RN called to room by patient's family and they expressed concern about the patient being very sleepy today and that he took an hour to eat lunch and that his coordination seemed to be a little off. RN discussed with the family that she had noticed that the patient did appear to be weaker today and having difficulty following instructions. The MD had already been made aware of these findings. RN shared the discussion had with the MD from this morning. RN then contacted MD to share the family's concerns. MD wrote orders for a CT scan of the head and a CXR and instructed RN to have a conversation with the family regarding the tests ordered. RN discussed the conversation had with the MD with the family. Family verbalized understanding. Patient was more alert at this time and eating his dinner. The family decided to hold off on any tests today and to wait and see how the patient was tomorrow. RN verified the family's decision and discontinued the orders per verbal order from the MD.   Othella Boyer [facility] 03/02/2018 5:11 PM

## 2018-03-02 NOTE — Progress Notes (Signed)
Cultures normal Referral c/s of urine sensitive to cipro Wounds look excellent Continue would care NURSES say patient bit vague today vs. Other days Continue to monitor D/c to facility on cipro got 7 fays or on longer; when OK with medicine Will follow

## 2018-03-02 NOTE — Progress Notes (Signed)
CSW following to assist with discharge planning to  SNF.  CSW faxed additional requested items to PASRR, Patient's PASRR still pending.   CSW contacted [facility] SN[facility] to inquire about bed availability for patient. CSW advised to contact SNF tomorrow if patient is ready for dc.   CSW updated patient/patient's family.  CSW will continue to follow and assist with discharge planning.  Abundio Miu, [place] Social Worker [facility] Cell#: [phone]

## 2018-03-03 DIAGNOSIS — K224 Dyskinesia of esophagus: Secondary | ICD-10-CM | POA: Diagnosis not present

## 2018-03-03 DIAGNOSIS — F419 Anxiety disorder, unspecified: Secondary | ICD-10-CM | POA: Diagnosis not present

## 2018-03-03 DIAGNOSIS — T8579XD Infection and inflammatory reaction due to other internal prosthetic devices, implants and grafts, subsequent encounter: Secondary | ICD-10-CM | POA: Diagnosis not present

## 2018-03-03 DIAGNOSIS — K2270 Barrett's esophagus without dysplasia: Secondary | ICD-10-CM | POA: Diagnosis not present

## 2018-03-03 DIAGNOSIS — B961 Klebsiella pneumoniae [K. pneumoniae] as the cause of diseases classified elsewhere: Secondary | ICD-10-CM | POA: Diagnosis not present

## 2018-03-03 DIAGNOSIS — K589 Irritable bowel syndrome without diarrhea: Secondary | ICD-10-CM | POA: Diagnosis not present

## 2018-03-03 DIAGNOSIS — R1310 Dysphagia, unspecified: Secondary | ICD-10-CM | POA: Diagnosis not present

## 2018-03-03 DIAGNOSIS — Z800 Family history of malignant neoplasm of digestive organs: Secondary | ICD-10-CM | POA: Diagnosis not present

## 2018-03-03 DIAGNOSIS — R509 Fever, unspecified: Secondary | ICD-10-CM | POA: Diagnosis not present

## 2018-03-03 DIAGNOSIS — F329 Major depressive disorder, single episode, unspecified: Secondary | ICD-10-CM | POA: Diagnosis not present

## 2018-03-03 DIAGNOSIS — E782 Mixed hyperlipidemia: Secondary | ICD-10-CM | POA: Diagnosis not present

## 2018-03-03 DIAGNOSIS — Z8249 Family history of ischemic heart disease and other diseases of the circulatory system: Secondary | ICD-10-CM | POA: Diagnosis not present

## 2018-03-03 DIAGNOSIS — Z7983 Long term (current) use of bisphosphonates: Secondary | ICD-10-CM | POA: Diagnosis not present

## 2018-03-03 DIAGNOSIS — R5381 Other malaise: Secondary | ICD-10-CM | POA: Diagnosis not present

## 2018-03-03 DIAGNOSIS — R2681 Unsteadiness on feet: Secondary | ICD-10-CM | POA: Diagnosis not present

## 2018-03-03 DIAGNOSIS — R1110 Vomiting, unspecified: Secondary | ICD-10-CM | POA: Diagnosis not present

## 2018-03-03 DIAGNOSIS — E860 Dehydration: Secondary | ICD-10-CM | POA: Diagnosis not present

## 2018-03-03 DIAGNOSIS — Z8546 Personal history of malignant neoplasm of prostate: Secondary | ICD-10-CM | POA: Diagnosis not present

## 2018-03-03 DIAGNOSIS — J439 Emphysema, unspecified: Secondary | ICD-10-CM | POA: Diagnosis not present

## 2018-03-03 DIAGNOSIS — E785 Hyperlipidemia, unspecified: Secondary | ICD-10-CM | POA: Diagnosis not present

## 2018-03-03 DIAGNOSIS — I10 Essential (primary) hypertension: Secondary | ICD-10-CM

## 2018-03-03 DIAGNOSIS — T83591D Infection and inflammatory reaction due to implanted urinary sphincter, subsequent encounter: Secondary | ICD-10-CM | POA: Diagnosis not present

## 2018-03-03 DIAGNOSIS — K2970 Gastritis, unspecified, without bleeding: Secondary | ICD-10-CM | POA: Diagnosis not present

## 2018-03-03 DIAGNOSIS — J9811 Atelectasis: Secondary | ICD-10-CM | POA: Diagnosis not present

## 2018-03-03 DIAGNOSIS — M810 Age-related osteoporosis without current pathological fracture: Secondary | ICD-10-CM | POA: Diagnosis not present

## 2018-03-03 DIAGNOSIS — R5383 Other fatigue: Secondary | ICD-10-CM | POA: Diagnosis not present

## 2018-03-03 DIAGNOSIS — B3781 Candidal esophagitis: Secondary | ICD-10-CM | POA: Diagnosis not present

## 2018-03-03 DIAGNOSIS — D72829 Elevated white blood cell count, unspecified: Secondary | ICD-10-CM | POA: Diagnosis not present

## 2018-03-03 DIAGNOSIS — Z743 Need for continuous supervision: Secondary | ICD-10-CM | POA: Diagnosis not present

## 2018-03-03 DIAGNOSIS — I340 Nonrheumatic mitral (valve) insufficiency: Secondary | ICD-10-CM | POA: Diagnosis not present

## 2018-03-03 DIAGNOSIS — F0390 Unspecified dementia without behavioral disturbance: Secondary | ICD-10-CM | POA: Diagnosis not present

## 2018-03-03 DIAGNOSIS — R279 Unspecified lack of coordination: Secondary | ICD-10-CM | POA: Diagnosis not present

## 2018-03-03 DIAGNOSIS — H409 Unspecified glaucoma: Secondary | ICD-10-CM | POA: Diagnosis not present

## 2018-03-03 DIAGNOSIS — I2510 Atherosclerotic heart disease of native coronary artery without angina pectoris: Secondary | ICD-10-CM | POA: Diagnosis not present

## 2018-03-03 DIAGNOSIS — T83518A Infection and inflammatory reaction due to other urinary catheter, initial encounter: Secondary | ICD-10-CM | POA: Diagnosis not present

## 2018-03-03 DIAGNOSIS — Z86010 Personal history of colonic polyps: Secondary | ICD-10-CM | POA: Diagnosis not present

## 2018-03-03 DIAGNOSIS — Z923 Personal history of irradiation: Secondary | ICD-10-CM | POA: Diagnosis not present

## 2018-03-03 DIAGNOSIS — N390 Urinary tract infection, site not specified: Secondary | ICD-10-CM | POA: Diagnosis not present

## 2018-03-03 DIAGNOSIS — Z9049 Acquired absence of other specified parts of digestive tract: Secondary | ICD-10-CM | POA: Diagnosis not present

## 2018-03-03 DIAGNOSIS — G9341 Metabolic encephalopathy: Secondary | ICD-10-CM | POA: Diagnosis not present

## 2018-03-03 DIAGNOSIS — R488 Other symbolic dysfunctions: Secondary | ICD-10-CM | POA: Diagnosis not present

## 2018-03-03 DIAGNOSIS — K222 Esophageal obstruction: Secondary | ICD-10-CM | POA: Diagnosis not present

## 2018-03-03 DIAGNOSIS — R112 Nausea with vomiting, unspecified: Secondary | ICD-10-CM | POA: Diagnosis not present

## 2018-03-03 DIAGNOSIS — Y846 Urinary catheterization as the cause of abnormal reaction of the patient, or of later complication, without mention of misadventure at the time of the procedure: Secondary | ICD-10-CM | POA: Diagnosis present

## 2018-03-03 DIAGNOSIS — T8579XA Infection and inflammatory reaction due to other internal prosthetic devices, implants and grafts, initial encounter: Secondary | ICD-10-CM | POA: Diagnosis not present

## 2018-03-03 DIAGNOSIS — K229 Disease of esophagus, unspecified: Secondary | ICD-10-CM | POA: Diagnosis not present

## 2018-03-03 DIAGNOSIS — Z87891 Personal history of nicotine dependence: Secondary | ICD-10-CM | POA: Diagnosis not present

## 2018-03-03 DIAGNOSIS — T83511A Infection and inflammatory reaction due to indwelling urethral catheter, initial encounter: Secondary | ICD-10-CM | POA: Diagnosis not present

## 2018-03-03 DIAGNOSIS — K2210 Ulcer of esophagus without bleeding: Secondary | ICD-10-CM | POA: Diagnosis not present

## 2018-03-03 DIAGNOSIS — Z9079 Acquired absence of other genital organ(s): Secondary | ICD-10-CM | POA: Diagnosis not present

## 2018-03-03 DIAGNOSIS — B965 Pseudomonas (aeruginosa) (mallei) (pseudomallei) as the cause of diseases classified elsewhere: Secondary | ICD-10-CM | POA: Diagnosis not present

## 2018-03-03 DIAGNOSIS — M6281 Muscle weakness (generalized): Secondary | ICD-10-CM | POA: Diagnosis not present

## 2018-03-03 DIAGNOSIS — R2689 Other abnormalities of gait and mobility: Secondary | ICD-10-CM | POA: Diagnosis not present

## 2018-03-03 MED ORDER — LORAZEPAM 1 MG PO TABS: 1.0000 mg | ORAL_TABLET | Freq: Every day | ORAL | 0 refills | Status: DC

## 2018-03-03 MED ORDER — PRAVASTATIN SODIUM 10 MG PO TABS: 40.0000 mg | ORAL_TABLET | Freq: Every day | ORAL | Status: DC

## 2018-03-03 MED ORDER — CIPROFLOXACIN HCL 500 MG PO TABS: 500.0000 mg | ORAL_TABLET | Freq: Two times a day (BID) | ORAL | 0 refills | Status: AC

## 2018-03-03 NOTE — Progress Notes (Signed)
Report given to nurse Sherrell at [facility]. Family aware of disposition. Awaiting for PTAR.   Ave Filter, RN

## 2018-03-03 NOTE — Discharge Summary (Addendum)
Physician Discharge Summary  Warren [name] MWN:[id number] DOB: 10/21/1922 DOA: 02/26/2018  PCP: Warren Burly, MD  Admit date: 02/26/2018 Discharge date: 03/03/2018  Time spent: 45 minutes  Recommendations for Outpatient Follow-up:  Patient will be discharged to [facility] skilled nursing facility, continue physical and occupational therapy.  Patient will need to follow up with primary care provider within one week of discharge.  Follow up with urology, Dr. Matilde [name], in one week. Patient should continue medications as prescribed.  Patient should follow a regular diet.   Discharge Diagnoses:  Infected artificial urinary sphincter Pseudomonas UTI Essential hypertension Coronary artery disease Anxiety/depression Physical deconditioning  Discharge Condition: Stable  Diet recommendation: regular  Filed Weights   02/26/18 1800  Weight: 79.6 kg (175 lb 7.8 oz)    History of present illness:  On 02/26/2018 by Dr. Amie Portland Richardsonis a 84 y.o.malewith medical history significantfor HTN,also history of coronary artery,carotid artery disease and emphysema patient denies thelater 3.Admitted to [facility] 4/19- 4/26,with initial complaints of nausea dizziness. Most ofthe history is obtained from the patient, his familyand urology note.I see imaging and lab results but I do not see a transfer summary in the physical chart. UA was suggestive of UTI with large leukocyte esterase, Lactic acid 2.1,WBC 13, hemoglobin 13.3,[place], as but withurine cultures growing >100,000 colonies ofPseudomonas,sensitive to ciprofloxacin Zosyn and meropenem,IV antibiotics were switched to meropenem1- 2 days prior totransfer.Chest x-ray on admission at [facility] pulmonary vascular congestion otherwise no acute pulmonary disease,CT abdomen pelvis without contrast4/19-diverticulosis without  diverticulitis mild inflammatory changes in the anterior abdominal wall,some of thesesurrounding the tube for the ureteral prostesis,this likely represents some focal cellulitis. No definitive abscess is noted. Patient and family report thatprostesiswas placed ~ 10 yrs ago, patient had done well for ~ 8 yrs, but have had some problems over the past 2 yrs. Prior to admission patient had being on low dose bactrim and then  Macrodantin for urinary problems/infection.  Urology Dr. Hubbard [name] [facility] because of travel andshort notice,patient was transferred here.Patient has an artificial urinary sphincter.He had catheter trauma,the device had been deactivated for several weeks and subsequently reactivated aftera recheck.Patient was transferred here for infected urinary prosthesis.Patient was accepted in transferbyHospitalistDr. Wendee Beavers.  Hospital Course:  Infected artificial urinary sphincter -Urology consulted and appreciated, removed on 02/27/2018 -was placed on Merrem -Continue wound care with wet-to-dry dressings BID per urology -foley catheter to stay in per urology  Pseudomonas UTI -Urine culture done at outside facility -Report of urology, culture showed Pseudomonas and was sensitive to ciprofloxacin -Urology recommended ciprofloxacin for 7 days -start cipro on 03/04/2018  -was on merrem   Essential hypertension -Not on any antihypertensive medications, BP currently stable  Coronary artery disease -Denies chest pain. -Has not been on Plavix in several months to years  Anxiety/depression -Continue Celexa  Physical deconditioning -Secondary to infection versus age -PT recommended SNF  Altered mentation vs lethargy -Resolved -the evening of 03/02/2018, received call that patient was not as alert as he had been earlier in the day and on previous days -patient neurologically intact -CT head ordered, however, declined by  family -currently AAOx3, no focal deficits -Repeat CXR remarkable for infection   Consultants Urology  Procedures  Removal infected artificial urinary sphincter  Discharge Exam: Vitals:   03/03/18 0930 03/03/18 1344  BP: (!) 146/99 112/74  Pulse: 94 89  Resp: 20 18  Temp: 98.3 F (36.8 C) 99 F (37.2 C)  SpO2:  97% 97%   Patient has no complaints today. Denies current chest pain, shortness of breath, abdominal pain, N/V/D/C.   General: Well developed, well nourished, NAD,  HEENT: NCAT, mucous membranes moist  Neck: Supple  Cardiovascular: S1 S2 auscultated, RRR, no murmur  Respiratory: Clear to auscultation bilaterally with equal chest rise  Abdomen: Soft, nontender, nondistended, + bowel sounds  Extremities: warm dry without cyanosis clubbing or edema  Neuro: AAOx3, nonfocal, hard of hearing  Psych: Appropriate mood and affect, pleasant  Discharge Instructions Discharge Instructions    Discharge instructions   Complete by:  As directed    Patient will be discharged to [facility] skilled nursing facility, continue physical and occupational therapy.  Patient will need to follow up with primary care provider within one week of discharge.  Follow up with urology, Dr. Matilde [name], in one week. Patient should continue medications as prescribed.  Patient should follow a regular diet.     Allergies as of 03/03/2018      Reactions   Aspirin    REACTION: SPOTS REACTION: SPOTS   Latex    unknown   Penicillins    Has patient had a PCN reaction causing immediate rash, facial/tongue/throat swelling, SOB or lightheadedness with hypotension:No Has patient had a PCN reaction causing severe rash involving mucus membranes or skin necrosis: No Has patient had a PCN reaction that required hospitalization:NoHas patient had a PCN reaction occurring within the last 10 years:No If all of the above answers are "NO", then may proceed with Cephalosporin use.      Medication List     STOP taking these medications   aspirin EC 81 MG tablet   clopidogrel 75 MG tablet Commonly known as:  PLAVIX   Krill Oil 300 MG Caps   nitrofurantoin 100 MG capsule Commonly known as:  MACRODANTIN   nitroGLYCERIN 0.4 MG SL tablet Commonly known as:  NITROSTAT     TAKE these medications   alendronate 70 MG tablet Commonly known as:  FOSAMAX Take 70 mg by mouth once a week. Take 70 mg once a week in the morning with a full glass of water on an empty stomach. Do not lay down for 30 minutes   CALCIUM 600 600 MG Tabs tablet Generic drug:  calcium carbonate Take 600 mg by mouth 2 (two) times daily.   ciprofloxacin 500 MG tablet Commonly known as:  CIPRO Take 1 tablet (500 mg total) by mouth 2 (two) times daily for 2 days. Start taking on:  03/04/2018   citalopram 20 MG tablet Commonly known as:  CELEXA Take 20 mg by mouth daily.   LORazepam 1 MG tablet Commonly known as:  ATIVAN Take 1 tablet (1 mg total) by mouth at bedtime. What changed:  when to take this   MYRBETRIQ 50 MG Tb24 tablet Generic drug:  mirabegron ER Take 50 mg by mouth daily.   pravastatin 10 MG tablet Commonly known as:  PRAVACHOL Take 4 tablets (40 mg total) by mouth daily. (HOLD X 2 WEEKS) What changed:  how much to take   Vitamin D3 2000 units capsule Take 2,000 Units by mouth daily.      Allergies  Allergen Reactions  . Aspirin     REACTION: SPOTS REACTION: SPOTS  . Latex     unknown  . Penicillins     Has patient had a PCN reaction causing immediate rash, facial/tongue/throat swelling, SOB or lightheadedness with hypotension:No Has patient had a PCN reaction causing severe rash involving mucus membranes  or skin necrosis: No Has patient had a PCN reaction that required hospitalization:NoHas patient had a PCN reaction occurring within the last 10 years:No If all of the above answers are "NO", then may proceed with Cephalosporin use.    Follow-up Information    Warren Burly, MD.  Schedule an appointment as soon as possible for a visit in 1 week(s).   Specialty:  Internal Medicine Why:  Hospital follow up Contact information: [address] [place] [address] [phone]        Bjorn Loser, MD. Schedule an appointment as soon as possible for a visit in 2 week(s).   Specialty:  Urology Why:  Please call office in 10-14 days to schedule appointment. Contact information: [place] [phone]            The results of significant diagnostics from this hospitalization (including imaging, microbiology, ancillary and laboratory) are listed below for reference.    Significant Diagnostic Studies: Dg Chest 2 View  Result Date: 03/02/2018 CLINICAL DATA:  Cough and chest congestion for the past 3-4 days. EXAM: CHEST - 2 VIEW COMPARISON:  02/19/2018. FINDINGS: Again demonstrated is a poor inspiration. Mild bibasilar atelectasis. Normal sized heart. Tortuous and calcified thoracic aorta. Mild diffuse peribronchial thickening. Mild thoracic spine degenerative changes. Diffuse osteopenia. Cholecystectomy clips. Gas and fluid distended stomach. IMPRESSION: 1. Poor inspiration with mild bibasilar atelectasis. 2. Mild bronchitic changes. 3. Gastric distention. Electronically Signed   By: Claudie Revering M.D.   On: 03/02/2018 21:08    Microbiology: Recent Results (from the past 240 hour(s))  Surgical pcr screen     Status: None   Collection Time: 02/27/18 12:47 AM  Result Value Ref Range Status   MRSA, PCR NEGATIVE NEGATIVE Final   Staphylococcus aureus NEGATIVE NEGATIVE Final    Comment: (NOTE) The Xpert SA Assay (FDA approved for NASAL specimens in patients 62 years of age and older), is one component of a comprehensive surveillance program. It is not intended to diagnose infection nor to guide or monitor treatment. Performed at [facility], [place] [address], [place], [place] [address]   Aerobic/Anaerobic  Culture (surgical/deep wound)     Status: None (Preliminary result)   Collection Time: 02/27/18 11:16 AM  Result Value Ref Range Status   Specimen Description   Final    TISSUE Performed at [facility] [address], [place], [place] [address]    Special Requests   Final    NONE Performed at [facility], [place] [address] [place], [place] [address]    Gram Stain   Final    ABUNDANT WBC PRESENT, PREDOMINANTLY PMN NO ORGANISMS SEEN    Culture   Final    NO GROWTH 4 DAYS NO ANAEROBES ISOLATED; CULTURE IN PROGRESS FOR 5 DAYS Performed at [facility] [address], [place], [place] [address]    Report Status PENDING  Incomplete     Labs: Basic Metabolic Panel: Recent Labs  Lab 02/26/18 2030 02/28/18 0511 03/01/18 0456  NA 137 137 138  K 3.2* 4.1 4.1  CL 96* 96* 98*  CO2 29 30 32  GLUCOSE 162* 141* 115*  BUN 9 13 16   CREATININE 0.81 0.73 0.63  CALCIUM 9.0 8.6* 8.7*   Liver Function Tests: Recent Labs  Lab 02/26/18 2030 02/28/18 0511 03/01/18 0456  AST 17 11* 19  ALT 15* 11* 16*  ALKPHOS 40 31* 33*  BILITOT 0.7 0.5  0.4  PROT 6.4* 5.5* 5.6*  ALBUMIN 3.0* 2.5* 2.6*   No results for input(s): LIPASE, AMYLASE in the last 168 hours. No results for input(s): AMMONIA in the last 168 hours. CBC: Recent Labs  Lab 02/26/18 2030 02/28/18 0511 03/01/18 0456  WBC 11.3* 9.8 8.8  NEUTROABS  --  8.6* 6.3  HGB 14.5 11.6* 11.6*  HCT 43.0 36.1* 36.4*  MCV 92.9 94.3 94.8  PLT 228 267 257   Cardiac Enzymes: No results for input(s): CKTOTAL, CKMB, CKMBINDEX, TROPONINI in the last 168 hours. BNP: BNP (last 3 results) No results for input(s): BNP in the last 8760 hours.  ProBNP (last 3 results) No results for input(s): PROBNP in the last 8760 hours.  CBG: No results for input(s): GLUCAP in the last 168 hours.     Signed:  Cristal Ford  [facility] 03/03/2018, 1:54 PM   Addendum: Received notice that  patient's tissue culture from 02/27/2018 returned with rare candida albicans. Discussed with infectious disease, Dr. Tommy Medal, who recommended watching the patient clinically and not adding antifungals. Will also make urology aware of culture results.

## 2018-03-03 NOTE — Clinical Social Work Placement (Signed)
PAtient received and accepted bed offer at [facility]. Facility aware of patient's discharge and confirmed patient's bed offer. [facility] contacted, patient's family notified. Patient's RN can call report to [phone], packet complete. CSW signing off, no other needs identified at this time.   CLINICAL SOCIAL WORK PLACEMENT  NOTE  Date:  03/03/2018  Patient Details  Name: Warren [name] MRN: [id number] Date of Birth: 01-Oct-1922  Clinical Social Work is seeking post-discharge placement for this patient at the [place] level of care (*CSW will initial, date and re-position this form in  chart as items are completed):  Yes   Patient/family provided with [facility] Work Department's list of facilities offering this level of care within the geographic area requested by the patient (or if unable, by the patient's family).  Yes   Patient/family informed of their freedom to choose among providers that offer the needed level of care, that participate in Medicare, Medicaid or managed care program needed by the patient, have an available bed and are willing to accept the patient.  Yes   Patient/family informed of [facility]'s ownership interest in [facility] and [facility], as well as of the fact that they are under no obligation to receive care at these facilities.  PASRR submitted to EDS on 03/01/18     PASRR number received on 03/03/18     Existing PASRR number confirmed on       FL2 transmitted to all facilities in geographic area requested by pt/family on 03/01/18     FL2 transmitted to all facilities within larger geographic area on       Patient informed that his/her managed care company has contracts with or will negotiate with certain facilities, including the following:        Yes   Patient/family informed of bed offers received.  Patient chooses bed at [facility]     Physician recommends and patient chooses bed  at      Patient to be transferred to [facility] on 03/03/18.  Patient to be transferred to facility by [facility]     Patient family notified on 03/03/18 of transfer.  Name of family member notified:  annette nickleson     PHYSICIAN       Additional Comment:    _______________________________________________ Burnis Medin, LCSW 03/03/2018, 4:24 PM

## 2018-03-03 NOTE — Discharge Instructions (Signed)
Urinary Tract Infection, Adult  A urinary tract infection (UTI) is an infection of any part of the urinary tract, which includes the kidneys, ureters, bladder, and urethra. These organs make, store, and get rid of urine in the body. UTI can be a bladder infection (cystitis) or kidney infection (pyelonephritis).  What are the causes?  This infection may be caused by fungi, viruses, or bacteria. Bacteria are the most common cause of UTIs. This condition can also be caused by repeated incomplete emptying of the bladder during urination.  What increases the risk?  This condition is more likely to develop if:   You ignore your need to urinate or hold urine for long periods of time.   You do not empty your bladder completely during urination.   You wipe back to front after urinating or having a bowel movement, if you are male.   You are uncircumcised, if you are male.   You are constipated.   You have a urinary catheter that stays in place (indwelling).   You have a weak defense (immune) system.   You have a medical condition that affects your bowels, kidneys, or bladder.   You have diabetes.   You take antibiotic medicines frequently or for long periods of time, and the antibiotics no longer work well against certain types of infections (antibiotic resistance).   You take medicines that irritate your urinary tract.   You are exposed to chemicals that irritate your urinary tract.   You are male.    What are the signs or symptoms?  Symptoms of this condition include:   Fever.   Frequent urination or passing small amounts of urine frequently.   Needing to urinate urgently.   Pain or burning with urination.   Urine that smells bad or unusual.   Cloudy urine.   Pain in the lower abdomen or back.   Trouble urinating.   Blood in the urine.   Vomiting or being less hungry than normal.   Diarrhea or abdominal pain.   Vaginal discharge, if you are male.    How is this diagnosed?  This condition is  diagnosed with a medical history and physical exam. You will also need to provide a urine sample to test your urine. Other tests may be done, including:   Blood tests.   Sexually transmitted disease (STD) testing.    If you have had more than one UTI, a cystoscopy or imaging studies may be done to determine the cause of the infections.  How is this treated?  Treatment for this condition often includes a combination of two or more of the following:   Antibiotic medicine.   Other medicines to treat less common causes of UTI.   Over-the-counter medicines to treat pain.   Drinking enough water to stay hydrated.    Follow these instructions at home:   Take over-the-counter and prescription medicines only as told by your health care provider.   If you were prescribed an antibiotic, take it as told by your health care provider. Do not stop taking the antibiotic even if you start to feel better.   Avoid alcohol, caffeine, tea, and carbonated beverages. They can irritate your bladder.   Drink enough fluid to keep your urine clear or pale yellow.   Keep all follow-up visits as told by your health care provider. This is important.   Make sure to:  ? Empty your bladder often and completely. Do not hold urine for long periods of time.  ?   Empty your bladder before and after sex.  ? Wipe from front to back after a bowel movement if you are male. Use each tissue one time when you wipe.  Contact a health care provider if:   You have back pain.   You have a fever.   You feel nauseous or vomit.   Your symptoms do not get better after 3 days.   Your symptoms go away and then return.  Get help right away if:   You have severe back pain or lower abdominal pain.   You are vomiting and cannot keep down any medicines or water.  This information is not intended to replace advice given to you by your health care provider. Make sure you discuss any questions you have with your health care provider.  Document Released:  07/30/2005 Document Revised: 04/02/2016 Document Reviewed: 09/10/2015  [employer]vier Interactive Patient Education  2018 [employer]

## 2018-03-04 ENCOUNTER — Encounter: Payer: Self-pay | Admitting: Internal Medicine

## 2018-03-04 ENCOUNTER — Non-Acute Institutional Stay (SKILLED_NURSING_FACILITY): Payer: Medicare Other | Admitting: Internal Medicine

## 2018-03-04 DIAGNOSIS — M810 Age-related osteoporosis without current pathological fracture: Secondary | ICD-10-CM | POA: Diagnosis not present

## 2018-03-04 DIAGNOSIS — N390 Urinary tract infection, site not specified: Secondary | ICD-10-CM

## 2018-03-04 DIAGNOSIS — F419 Anxiety disorder, unspecified: Secondary | ICD-10-CM

## 2018-03-04 DIAGNOSIS — F329 Major depressive disorder, single episode, unspecified: Secondary | ICD-10-CM | POA: Diagnosis not present

## 2018-03-04 DIAGNOSIS — B965 Pseudomonas (aeruginosa) (mallei) (pseudomallei) as the cause of diseases classified elsewhere: Secondary | ICD-10-CM

## 2018-03-04 DIAGNOSIS — I10 Essential (primary) hypertension: Secondary | ICD-10-CM

## 2018-03-04 DIAGNOSIS — F32A Depression, unspecified: Secondary | ICD-10-CM

## 2018-03-04 DIAGNOSIS — I2510 Atherosclerotic heart disease of native coronary artery without angina pectoris: Secondary | ICD-10-CM

## 2018-03-04 DIAGNOSIS — T8579XD Infection and inflammatory reaction due to other internal prosthetic devices, implants and grafts, subsequent encounter: Secondary | ICD-10-CM | POA: Diagnosis not present

## 2018-03-04 DIAGNOSIS — IMO0001 Reserved for inherently not codable concepts without codable children: Secondary | ICD-10-CM

## 2018-03-04 NOTE — Progress Notes (Signed)
: Provider:  Noah Delaine. Sheppard Coil, MD Location:  [place] Room Number: 100P Place of Service:  SNF (31)  PCP: [name], MD Patient Care Team: [name], MD as PCP - General (Unknown Physician Specialty)  Extended Emergency Contact Information Primary Emergency Contact: Joandy, Burget Home Phone: [phone] Mobile Phone: [phone] Relation: Son Secondary Emergency Contact: Vinnie Level Mobile Phone: [phone] Relation: Daughter     Allergies: Aspirin; Latex; and Penicillins  Chief Complaint  Patient presents with  . New Admit To SNF    Admit to Facility     HPI: Patient is 84 y.o. male hypertension, coronary artery disease, carotid artery disease, emphysema, who was admitted to [facility] from 4/19-26 with initial complaints of nausea and dizziness.  UA was suggestive of UTI and lactic acid was elevated as was the white count patient.  Patient was initially started on Cipro at [facility] for UTI but urine cultures grew out greater than 100,000 colonies of Pseudomonas sensitive to Cipro Zosyn and meropenem.  IV antibiotics were switched to meropenem 1 or 2 days prior to transfer chest x-ray at [facility] showed mild pulmonary vascular congestion;.  CT abdomen showed some inflammatory changes in the anterior abdominal wall some of the surrounding the tube for the ureteral process thesis, most likely representing some focal cellulitis.  No abscess was noted; patient and family reported that prosthesis was placed about 10 years ago and had done well for about 8 years and then over the past 2 years that have been problems patient has been on low-dose Bactrim and then Macrodantin for urinary problems/infection.  Patient was transferred from [facility] to [facility] for urology consult.  Patient with an artificial urinary sphincter with catheter trauma and the device has been deactivated for several weeks and  subsequently reactivated after recheck patient was transferred for infected urinary prosthesis.  Patient was admitted to [facility] from a 4/26-5/1 where urology was consulted and removed prosthesis on 4/27 and where patient was placed on meropenem for his Pseudomonas UTI.  After MIC came out it was found that the Pseudomonas was sensitive to Cipro and patient was changed to Cipro for 7 days.  Patient had 1 day of altered mentation versus lethargy on the evening of 4/30-patient was neurologically intact her family declined CT head and patient resolved on its own.  Patient is admitted to skilled nursing facility for OT/PT and for wound care.  While at skilled nursing facility patient will be followed for hyperlipidemia and treated with Pravachol, depression treated with Celexa and osteoporosis treated with Fosamax and calcium.  Past Medical History:  Diagnosis Date  . CAD (coronary artery disease)    moderate by catheterization,11/08...essentially unchanged by repeat study,10/11  . Carotid artery disease (HCC)     less than 50% stenosis bilaterally March 2013   . Claudication ([place])     ABIs within ABIs within normal limits, 40% left iliac disease by catheterization   . Colon polyps   . Depression   . Dizzy   . Edema extremities     secondary to severe varicose veins. Nonpitting edema and dermatitis.   . Emphysema   . Glaucoma   . Hyperlipidemia   . IBS (irritable bowel syndrome)   . Mitral regurgitation    mild  . Mixed dyslipidemia   . Peripheral edema   . Pneumonia   . [facility]state [facility]    s/p radiation treatment/s/p TURP  . Shortness of  breath    Emphysema  . Thyroid nodule     posterior and inferior to the right lobepossibly outside of the gland secondary to parathyroid adenoma in addition multiple bilateral thyroid nodules     Past Surgical History:  Procedure Laterality Date  . CARDIAC CATHETERIZATION  2008  . CHOLECYSTECTOMY    . CYSTOSCOPY N/A 02/27/2018    Procedure: CYSTOSCOPY;  Surgeon: Bjorn Loser, MD;  Location: [facility]S;  Service: Urology;  Laterality: N/A;  . INCONTINENCE SURGERY     ring inserted  . TONSILLECTOMY    . TRANSURETHRAL RESECTION OF PROSTATE     has stress urinary incontinence  . URINARY SPHINCTER IMPLANT N/A 02/27/2018   Procedure: REMOVAL INFECTED ARTIFICIAL URINARY SPHINCTER;  Surgeon: Bjorn Loser, MD;  Location: [facility];  Service: Urology;  Laterality: N/A;    Allergies as of 03/04/2018      Reactions   Aspirin    REACTION: SPOTS REACTION: SPOTS   Latex    unknown   Penicillins    Has patient had a PCN reaction causing immediate rash, facial/tongue/throat swelling, SOB or lightheadedness with hypotension:No Has patient had a PCN reaction causing severe rash involving mucus membranes or skin necrosis: No Has patient had a PCN reaction that required hospitalization:NoHas patient had a PCN reaction occurring within the last 10 years:No If all of the above answers are "NO", then may proceed with Cephalosporin use.      Medication List        Accurate as of 03/04/18  3:09 PM. Always use your most recent med list.          alendronate 70 MG tablet Commonly known as:  FOSAMAX Take 70 mg by mouth once a week. Take 70 mg once a week in the morning with a full glass of water on an empty stomach. Do not lay down for 30 minutes   CALCIUM 600 600 MG Tabs tablet Generic drug:  calcium carbonate Take 600 mg by mouth 2 (two) times daily.   ciprofloxacin 500 MG tablet Commonly known as:  CIPRO Take 1 tablet (500 mg total) by mouth 2 (two) times daily for 2 days.   citalopram 20 MG tablet Commonly known as:  CELEXA Take 20 mg by mouth daily.   LORazepam 1 MG tablet Commonly known as:  ATIVAN Take 1 tablet (1 mg total) by mouth at bedtime.   MYRBETRIQ 50 MG Tb24 tablet Generic drug:  mirabegron ER Take 50 mg by mouth daily.   pravastatin 10 MG tablet Commonly known as:  PRAVACHOL Take 4 tablets (40 mg  total) by mouth daily. (HOLD X 2 WEEKS)   Vitamin D3 2000 units capsule Take 2,000 Units by mouth daily.       No orders of the defined types were placed in this encounter.    There is no immunization history on file for this patient.  Social History   Tobacco Use  . Smoking status: Former Smoker    Packs/day: 1.00    Years: 40.00    Pack years: 40.00    Types: Cigarettes    Last attempt to quit: 11/04/1983    Years since quitting: 34.3  . Smokeless tobacco: Never Used  Substance Use Topics  . Alcohol use: No    Family history is   Family History  Problem Relation Age of Onset  . Heart attack Sister        in her 41's  . Other Mother  lived to be 49  . Pancreatic cancer Father   . Crohn's disease Son   . Colon cancer Neg Hx       Review of Systems  DATA OBTAINED: from patient, nurse, GENERAL:  no fevers, fatigue, appetite changes SKIN: No itching, or rash EYES: No eye pain, redness, discharge EARS: No earache, tinnitus, change in hearing NOSE: No congestion, drainage or bleeding  MOUTH/THROAT: No mouth or tooth pain, No sore throat RESPIRATORY: No cough, wheezing, SOB CARDIAC: No chest pain, palpitations, lower extremity edema  GI: No abdominal pain, No N/V/D or constipation, No heartburn or reflux  GU: No dysuria, frequency or urgency, or incontinence  MUSCULOSKELETAL: No unrelieved bone/joint pain NEUROLOGIC: No headache, dizziness or focal weakness PSYCHIATRIC: No c/o anxiety or sadness   Vitals:   03/04/18 1502  BP: 117/75  Pulse: 80  Resp: 20  Temp: 98.2 F (36.8 C)  SpO2: 98%    SpO2 Readings from Last 1 Encounters:  03/04/18 98%   Body mass index is 27.49 kg/m.     Physical Exam  GENERAL APPEARANCE: Alert, conversant,  No acute distress.  SKIN: No diaphoresis rash HEAD: Normocephalic, atraumatic  EYES: Conjunctiva/lids clear. Pupils round, reactive. EOMs intact.  EARS: External exam WNL, canals clear. Hearing grossly  normal.  NOSE: No deformity or discharge.  MOUTH/THROAT: Lips w/o lesions  RESPIRATORY: Breathing is even, unlabored. Lung sounds are clear   CARDIOVASCULAR: Heart RRR no murmurs, rubs or gallops. No peripheral edema.   GASTROINTESTINAL: Abdomen is soft, non-tender, not distended w/ normal bowel sounds. GENITOURINARY: Bladder non tender, not distended: Incisions from removal of prosthesis are deep but looks clean one in right groin 1 in perineum MUSCULOSKELETAL: No abnormal joints or musculature NEUROLOGIC:  Cranial nerves 2-12 grossly intact. Moves all extremities  PSYCHIATRIC: Mood and affect appropriate to situation, no behavioral issues  Patient Active Problem List   Diagnosis Date Noted  . Acute lower UTI 03/01/2018  . Internal device, implant, or graft infection or inflammation ([place]) 03/01/2018  . Emphysema   . Thyroid nodule   . Carotid artery disease ([place])   . CAD (coronary artery disease)   . Edema extremities   . Claudication ([place]) 07/30/2011  . DYSLIPIDEMIA 07/03/2010  . HYPERTENSION, BENIGN 07/03/2010  . PERIPHERAL EDEMA 07/03/2010  . SHORTNESS OF BREATH 07/03/2010  . PRECORDIAL PAIN 07/03/2010      Labs reviewed: Basic Metabolic Panel:    Component Value Date/Time   NA 138 03/01/2018 0456   K 4.1 03/01/2018 0456   CL 98 (L) 03/01/2018 0456   CO2 32 03/01/2018 0456   GLUCOSE 115 (H) 03/01/2018 0456   BUN 16 03/01/2018 0456   CREATININE 0.63 03/01/2018 0456   CALCIUM 8.7 (L) 03/01/2018 0456   PROT 5.6 (L) 03/01/2018 0456   ALBUMIN 2.6 (L) 03/01/2018 0456   AST 19 03/01/2018 0456   ALT 16 (L) 03/01/2018 0456   ALKPHOS 33 (L) 03/01/2018 0456   BILITOT 0.4 03/01/2018 0456   GFRNONAA >60 03/01/2018 0456   GFRAA >60 03/01/2018 0456    Recent Labs    02/26/18 2030 02/28/18 0511 03/01/18 0456  NA 137 137 138  K 3.2* 4.1 4.1  CL 96* 96* 98*  CO2 29 30 32  GLUCOSE 162* 141* 115*  BUN 9 13 16   CREATININE 0.81 0.73 0.63  CALCIUM 9.0 8.6* 8.7*   Liver  Function Tests: Recent Labs    02/26/18 2030 02/28/18 0511 03/01/18 0456  AST 17 11* 19  ALT  15* 11* 16*  ALKPHOS 40 31* 33*  BILITOT 0.7 0.5 0.4  PROT 6.4* 5.5* 5.6*  ALBUMIN 3.0* 2.5* 2.6*   No results for input(s): LIPASE, AMYLASE in the last 8760 hours. No results for input(s): AMMONIA in the last 8760 hours. CBC: Recent Labs    02/26/18 2030 02/28/18 0511 03/01/18 0456  WBC 11.3* 9.8 8.8  NEUTROABS  --  8.6* 6.3  HGB 14.5 11.6* 11.6*  HCT 43.0 36.1* 36.4*  MCV 92.9 94.3 94.8  PLT 228 267 257   Lipid No results for input(s): CHOL, HDL, LDLCALC, TRIG in the last 8760 hours.  Cardiac Enzymes: No results for input(s): CKTOTAL, CKMB, CKMBINDEX, TROPONINI in the last 8760 hours. BNP: No results for input(s): BNP in the last 8760 hours. No results found for: MICROALBUR No results found for: HGBA1C No results found for: TSH No results found for: VITAMINB12 No results found for: FOLATE No results found for: IRON, TIBC, FERRITIN  Imaging and Procedures obtained prior to SNF admission: No results found.   Not all labs, radiology exams or other studies done during hospitalization come through on my EPIC note; however they are reviewed by me.    Assessment and Plan  Infected artificial urinary sphincter/Pseudomonas UTI;- urology consulted, removed on 4/27; patient placed on meropenem; Foley catheter placed and to stay per urology; Pseudomonas sensitive to ciprofloxacin so urology recommends Cipro for 7 days Cipro was started on 5/2 SNF -admitted for OT/PT and wound care; continue Cipro for 7 more days 500 twice daily; continue Myrbetriq 50 mg daily`  Hypertension; meds stopped in the hospital SNF -will monitor and add medication back if needed  Coronary artery disease SNF -patient has not been on Plavix for years: Patient only on Pravachol 40 mg daily: We will continue  Depression SNF -continue Celexa 20 mg daily  Anxiety SNF -continue Ativan 1 mg  nightly  Osteoporosis SNF -table, no fractures continue Fosamax 70 mg weekly and calcium 600 mg twice daily   Time spent greater than 45 minutes;> 50% of time with patient was spent reviewing records, labs, tests and studies, counseling and developing plan of care  Webb Silversmith D. Sheppard Coil, MD

## 2018-03-06 ENCOUNTER — Encounter: Payer: Self-pay | Admitting: Internal Medicine

## 2018-03-06 DIAGNOSIS — F329 Major depressive disorder, single episode, unspecified: Secondary | ICD-10-CM | POA: Insufficient documentation

## 2018-03-06 DIAGNOSIS — F419 Anxiety disorder, unspecified: Secondary | ICD-10-CM

## 2018-03-06 DIAGNOSIS — M810 Age-related osteoporosis without current pathological fracture: Secondary | ICD-10-CM | POA: Insufficient documentation

## 2018-03-06 DIAGNOSIS — N390 Urinary tract infection, site not specified: Secondary | ICD-10-CM

## 2018-03-06 DIAGNOSIS — B965 Pseudomonas (aeruginosa) (mallei) (pseudomallei) as the cause of diseases classified elsewhere: Secondary | ICD-10-CM | POA: Insufficient documentation

## 2018-03-06 DIAGNOSIS — F32A Depression, unspecified: Secondary | ICD-10-CM | POA: Insufficient documentation

## 2018-03-06 LAB — AEROBIC/ANAEROBIC CULTURE W GRAM STAIN (SURGICAL/DEEP WOUND)

## 2018-03-06 LAB — AEROBIC/ANAEROBIC CULTURE (SURGICAL/DEEP WOUND)

## 2018-03-22 ENCOUNTER — Inpatient Hospital Stay (HOSPITAL_COMMUNITY)
Admission: EM | Admit: 2018-03-22 | Discharge: 2018-03-30 | DRG: 391 | Disposition: A | Discharge status: Skilled Nursing Facility | Payer: Medicare Other | Attending: Family Medicine | Admitting: Family Medicine

## 2018-03-22 ENCOUNTER — Emergency Department (HOSPITAL_COMMUNITY): Payer: Medicare Other

## 2018-03-22 ENCOUNTER — Non-Acute Institutional Stay (SKILLED_NURSING_FACILITY): Payer: Medicare Other | Admitting: Internal Medicine

## 2018-03-22 ENCOUNTER — Encounter: Payer: Self-pay | Admitting: Internal Medicine

## 2018-03-22 ENCOUNTER — Other Ambulatory Visit: Payer: Self-pay

## 2018-03-22 DIAGNOSIS — T83511A Infection and inflammatory reaction due to indwelling urethral catheter, initial encounter: Secondary | ICD-10-CM | POA: Diagnosis not present

## 2018-03-22 DIAGNOSIS — J439 Emphysema, unspecified: Secondary | ICD-10-CM | POA: Diagnosis present

## 2018-03-22 DIAGNOSIS — R279 Unspecified lack of coordination: Secondary | ICD-10-CM | POA: Diagnosis not present

## 2018-03-22 DIAGNOSIS — R402362 Coma scale, best motor response, obeys commands, at arrival to emergency department: Secondary | ICD-10-CM | POA: Diagnosis present

## 2018-03-22 DIAGNOSIS — D72829 Elevated white blood cell count, unspecified: Secondary | ICD-10-CM | POA: Diagnosis present

## 2018-03-22 DIAGNOSIS — F0390 Unspecified dementia without behavioral disturbance: Secondary | ICD-10-CM | POA: Diagnosis not present

## 2018-03-22 DIAGNOSIS — Z9049 Acquired absence of other specified parts of digestive tract: Secondary | ICD-10-CM | POA: Diagnosis not present

## 2018-03-22 DIAGNOSIS — R2681 Unsteadiness on feet: Secondary | ICD-10-CM | POA: Diagnosis not present

## 2018-03-22 DIAGNOSIS — Z86010 Personal history of colonic polyps: Secondary | ICD-10-CM

## 2018-03-22 DIAGNOSIS — E860 Dehydration: Secondary | ICD-10-CM | POA: Diagnosis not present

## 2018-03-22 DIAGNOSIS — M6281 Muscle weakness (generalized): Secondary | ICD-10-CM | POA: Diagnosis not present

## 2018-03-22 DIAGNOSIS — G9341 Metabolic encephalopathy: Secondary | ICD-10-CM | POA: Diagnosis not present

## 2018-03-22 DIAGNOSIS — Z8249 Family history of ischemic heart disease and other diseases of the circulatory system: Secondary | ICD-10-CM

## 2018-03-22 DIAGNOSIS — Z800 Family history of malignant neoplasm of digestive organs: Secondary | ICD-10-CM | POA: Diagnosis not present

## 2018-03-22 DIAGNOSIS — R112 Nausea with vomiting, unspecified: Secondary | ICD-10-CM | POA: Diagnosis not present

## 2018-03-22 DIAGNOSIS — K222 Esophageal obstruction: Secondary | ICD-10-CM | POA: Diagnosis not present

## 2018-03-22 DIAGNOSIS — K2970 Gastritis, unspecified, without bleeding: Secondary | ICD-10-CM | POA: Diagnosis not present

## 2018-03-22 DIAGNOSIS — Z9079 Acquired absence of other genital organ(s): Secondary | ICD-10-CM

## 2018-03-22 DIAGNOSIS — R5383 Other fatigue: Secondary | ICD-10-CM | POA: Diagnosis not present

## 2018-03-22 DIAGNOSIS — R1110 Vomiting, unspecified: Secondary | ICD-10-CM

## 2018-03-22 DIAGNOSIS — R509 Fever, unspecified: Secondary | ICD-10-CM | POA: Diagnosis not present

## 2018-03-22 DIAGNOSIS — Z886 Allergy status to analgesic agent status: Secondary | ICD-10-CM

## 2018-03-22 DIAGNOSIS — Z923 Personal history of irradiation: Secondary | ICD-10-CM

## 2018-03-22 DIAGNOSIS — K229 Disease of esophagus, unspecified: Secondary | ICD-10-CM | POA: Diagnosis not present

## 2018-03-22 DIAGNOSIS — Z8546 Personal history of malignant neoplasm of prostate: Secondary | ICD-10-CM

## 2018-03-22 DIAGNOSIS — Z7983 Long term (current) use of bisphosphonates: Secondary | ICD-10-CM | POA: Diagnosis not present

## 2018-03-22 DIAGNOSIS — E785 Hyperlipidemia, unspecified: Secondary | ICD-10-CM | POA: Diagnosis present

## 2018-03-22 DIAGNOSIS — Z87891 Personal history of nicotine dependence: Secondary | ICD-10-CM | POA: Diagnosis not present

## 2018-03-22 DIAGNOSIS — E782 Mixed hyperlipidemia: Secondary | ICD-10-CM | POA: Diagnosis not present

## 2018-03-22 DIAGNOSIS — Y846 Urinary catheterization as the cause of abnormal reaction of the patient, or of later complication, without mention of misadventure at the time of the procedure: Secondary | ICD-10-CM | POA: Diagnosis present

## 2018-03-22 DIAGNOSIS — F419 Anxiety disorder, unspecified: Secondary | ICD-10-CM | POA: Diagnosis present

## 2018-03-22 DIAGNOSIS — R1314 Dysphagia, pharyngoesophageal phase: Secondary | ICD-10-CM | POA: Diagnosis not present

## 2018-03-22 DIAGNOSIS — Z91040 Latex allergy status: Secondary | ICD-10-CM

## 2018-03-22 DIAGNOSIS — K224 Dyskinesia of esophagus: Secondary | ICD-10-CM

## 2018-03-22 DIAGNOSIS — I2510 Atherosclerotic heart disease of native coronary artery without angina pectoris: Secondary | ICD-10-CM | POA: Diagnosis not present

## 2018-03-22 DIAGNOSIS — T83518A Infection and inflammatory reaction due to other urinary catheter, initial encounter: Secondary | ICD-10-CM | POA: Diagnosis not present

## 2018-03-22 DIAGNOSIS — F329 Major depressive disorder, single episode, unspecified: Secondary | ICD-10-CM | POA: Diagnosis present

## 2018-03-22 DIAGNOSIS — H409 Unspecified glaucoma: Secondary | ICD-10-CM | POA: Diagnosis not present

## 2018-03-22 DIAGNOSIS — R402252 Coma scale, best verbal response, oriented, at arrival to emergency department: Secondary | ICD-10-CM | POA: Diagnosis present

## 2018-03-22 DIAGNOSIS — K2270 Barrett's esophagus without dysplasia: Secondary | ICD-10-CM | POA: Diagnosis present

## 2018-03-22 DIAGNOSIS — R1310 Dysphagia, unspecified: Secondary | ICD-10-CM

## 2018-03-22 DIAGNOSIS — I340 Nonrheumatic mitral (valve) insufficiency: Secondary | ICD-10-CM | POA: Diagnosis not present

## 2018-03-22 DIAGNOSIS — R402142 Coma scale, eyes open, spontaneous, at arrival to emergency department: Secondary | ICD-10-CM | POA: Diagnosis present

## 2018-03-22 DIAGNOSIS — T83591D Infection and inflammatory reaction due to implanted urinary sphincter, subsequent encounter: Secondary | ICD-10-CM | POA: Diagnosis not present

## 2018-03-22 DIAGNOSIS — I10 Essential (primary) hypertension: Secondary | ICD-10-CM | POA: Diagnosis present

## 2018-03-22 DIAGNOSIS — K228 Other specified diseases of esophagus: Secondary | ICD-10-CM | POA: Diagnosis present

## 2018-03-22 DIAGNOSIS — I739 Peripheral vascular disease, unspecified: Secondary | ICD-10-CM | POA: Diagnosis present

## 2018-03-22 DIAGNOSIS — B3781 Candidal esophagitis: Secondary | ICD-10-CM | POA: Diagnosis present

## 2018-03-22 DIAGNOSIS — J9811 Atelectasis: Secondary | ICD-10-CM | POA: Diagnosis not present

## 2018-03-22 DIAGNOSIS — K209 Esophagitis, unspecified: Secondary | ICD-10-CM | POA: Diagnosis not present

## 2018-03-22 DIAGNOSIS — K589 Irritable bowel syndrome without diarrhea: Secondary | ICD-10-CM | POA: Diagnosis not present

## 2018-03-22 DIAGNOSIS — N390 Urinary tract infection, site not specified: Secondary | ICD-10-CM | POA: Diagnosis not present

## 2018-03-22 DIAGNOSIS — K2210 Ulcer of esophagus without bleeding: Secondary | ICD-10-CM | POA: Diagnosis not present

## 2018-03-22 DIAGNOSIS — Z743 Need for continuous supervision: Secondary | ICD-10-CM | POA: Diagnosis not present

## 2018-03-22 DIAGNOSIS — Z880 Allergy status to penicillin: Secondary | ICD-10-CM

## 2018-03-22 LAB — CBC WITH DIFFERENTIAL/PLATELET
Basophils Absolute: 0 10*3/uL (ref 0.0–0.1)
Basophils Relative: 0 %
EOS ABS: 0 10*3/uL (ref 0.0–0.7)
EOS PCT: 0 %
HCT: 44.3 % (ref 39.0–52.0)
Hemoglobin: 14.5 g/dL (ref 13.0–17.0)
LYMPHS ABS: 1 10*3/uL (ref 0.7–4.0)
Lymphocytes Relative: 7 %
MCH: 30.8 pg (ref 26.0–34.0)
MCHC: 32.7 g/dL (ref 30.0–36.0)
MCV: 94.1 fL (ref 78.0–100.0)
MONOS PCT: 7 %
Monocytes Absolute: 1.1 10*3/uL — ABNORMAL HIGH (ref 0.1–1.0)
Neutro Abs: 13.1 10*3/uL — ABNORMAL HIGH (ref 1.7–7.7)
Neutrophils Relative %: 86 %
PLATELETS: 216 10*3/uL (ref 150–400)
RBC: 4.71 MIL/uL (ref 4.22–5.81)
RDW: 14.9 % (ref 11.5–15.5)
WBC: 15.3 10*3/uL — ABNORMAL HIGH (ref 4.0–10.5)

## 2018-03-22 LAB — URINALYSIS, ROUTINE W REFLEX MICROSCOPIC
Bilirubin Urine: NEGATIVE
Glucose, UA: NEGATIVE mg/dL
Ketones, ur: NEGATIVE mg/dL
Nitrite: NEGATIVE
PROTEIN: 30 mg/dL — AB
Specific Gravity, Urine: 1.015 (ref 1.005–1.030)
pH: 7 (ref 5.0–8.0)

## 2018-03-22 LAB — PROTIME-INR
INR: 1.07
PROTHROMBIN TIME: 13.8 s (ref 11.4–15.2)

## 2018-03-22 LAB — LIPASE, BLOOD: LIPASE: 27 U/L (ref 11–51)

## 2018-03-22 LAB — COMPREHENSIVE METABOLIC PANEL
ALBUMIN: 3.8 g/dL (ref 3.5–5.0)
ALK PHOS: 47 U/L (ref 38–126)
ALT: 11 U/L — AB (ref 17–63)
AST: 15 U/L (ref 15–41)
Anion gap: 12 (ref 5–15)
BILIRUBIN TOTAL: 1.3 mg/dL — AB (ref 0.3–1.2)
BUN: 13 mg/dL (ref 6–20)
CALCIUM: 9.6 mg/dL (ref 8.9–10.3)
CO2: 28 mmol/L (ref 22–32)
CREATININE: 0.86 mg/dL (ref 0.61–1.24)
Chloride: 96 mmol/L — ABNORMAL LOW (ref 101–111)
GFR calc Af Amer: >60 mL/min (ref >60)
GFR calc non Af Amer: >60 mL/min (ref >60)
GLUCOSE: 135 mg/dL — AB (ref 65–99)
Potassium: 3.9 mmol/L (ref 3.5–5.1)
SODIUM: 136 mmol/L (ref 135–145)
TOTAL PROTEIN: 7.3 g/dL (ref 6.5–8.1)

## 2018-03-22 LAB — I-STAT CG4 LACTIC ACID, ED: Lactic Acid, Venous: 1.4 mmol/L (ref 0.5–1.9)

## 2018-03-22 LAB — BRAIN NATRIURETIC PEPTIDE: B NATRIURETIC PEPTIDE 5: 39 pg/mL (ref 0.0–100.0)

## 2018-03-22 LAB — I-STAT TROPONIN, ED: Troponin i, poc: 0 ng/mL (ref 0.00–0.08)

## 2018-03-22 MED ORDER — SODIUM CHLORIDE 0.9 % IV SOLN
Freq: Once | INTRAVENOUS | Status: AC
  Administered 2018-03-22: 20:00:00 via INTRAVENOUS

## 2018-03-22 MED ORDER — IOPAMIDOL (ISOVUE-300) INJECTION 61%
100.0000 mL | Freq: Once | INTRAVENOUS | Status: AC | PRN
  Administered 2018-03-22: 100 mL via INTRAVENOUS

## 2018-03-22 MED ORDER — LORAZEPAM 2 MG/ML IJ SOLN
0.5000 mg | Freq: Two times a day (BID) | INTRAMUSCULAR | Status: DC | PRN
  Administered 2018-03-22 – 2018-03-29 (×4): 0.5 mg via INTRAVENOUS
  Filled 2018-03-22 (×4): qty 1

## 2018-03-22 MED ORDER — CITALOPRAM HYDROBROMIDE 20 MG PO TABS
20.0000 mg | ORAL_TABLET | Freq: Every day | ORAL | Status: DC
  Filled 2018-03-22: qty 1

## 2018-03-22 MED ORDER — IPRATROPIUM-ALBUTEROL 0.5-2.5 (3) MG/3ML IN SOLN: 3.0000 mL | RESPIRATORY_TRACT | Status: DC | PRN

## 2018-03-22 MED ORDER — SODIUM CHLORIDE 0.9 % IV SOLN
1.0000 g | Freq: Three times a day (TID) | INTRAVENOUS | Status: DC
  Administered 2018-03-22 – 2018-03-30 (×24): 1 g via INTRAVENOUS
  Filled 2018-03-22 (×27): qty 1

## 2018-03-22 MED ORDER — ONDANSETRON HCL 4 MG/2ML IJ SOLN
4.0000 mg | Freq: Four times a day (QID) | INTRAMUSCULAR | Status: DC | PRN
  Administered 2018-03-24 – 2018-03-25 (×2): 4 mg via INTRAVENOUS
  Filled 2018-03-22: qty 2

## 2018-03-22 MED ORDER — ONDANSETRON HCL 4 MG PO TABS: 4.0000 mg | ORAL_TABLET | Freq: Four times a day (QID) | ORAL | Status: DC | PRN

## 2018-03-22 MED ORDER — SODIUM CHLORIDE 0.9 % IV SOLN
Freq: Once | INTRAVENOUS | Status: AC
  Administered 2018-03-22: 23:00:00 via INTRAVENOUS

## 2018-03-22 MED ORDER — FAMOTIDINE IN NACL 20-0.9 MG/50ML-% IV SOLN
20.0000 mg | Freq: Two times a day (BID) | INTRAVENOUS | Status: DC
  Administered 2018-03-22 – 2018-03-30 (×14): 20 mg via INTRAVENOUS
  Filled 2018-03-22 (×16): qty 50

## 2018-03-22 MED ORDER — ENOXAPARIN SODIUM 40 MG/0.4ML ~~LOC~~ SOLN
40.0000 mg | Freq: Every day | SUBCUTANEOUS | Status: DC
  Administered 2018-03-22 – 2018-03-29 (×8): 40 mg via SUBCUTANEOUS
  Filled 2018-03-22 (×8): qty 0.4

## 2018-03-22 MED ORDER — BISACODYL 10 MG RE SUPP: 10.0000 mg | Freq: Every day | RECTAL | Status: DC | PRN

## 2018-03-22 MED ORDER — PRAVASTATIN SODIUM 20 MG PO TABS
40.0000 mg | ORAL_TABLET | Freq: Every day | ORAL | Status: DC
  Filled 2018-03-22: qty 2

## 2018-03-22 MED ORDER — IOPAMIDOL (ISOVUE-300) INJECTION 61%
INTRAVENOUS | Status: AC
  Filled 2018-03-22: qty 100

## 2018-03-22 MED ORDER — SODIUM CHLORIDE 0.9 % IV BOLUS
500.0000 mL | Freq: Once | INTRAVENOUS | Status: AC
  Administered 2018-03-22: 500 mL via INTRAVENOUS

## 2018-03-22 MED ORDER — FLUCONAZOLE IN SODIUM CHLORIDE 200-0.9 MG/100ML-% IV SOLN
200.0000 mg | Freq: Once | INTRAVENOUS | Status: AC
  Administered 2018-03-22: 200 mg via INTRAVENOUS
  Filled 2018-03-22: qty 100

## 2018-03-22 NOTE — H&P (Addendum)
History and Physical    Warren [name] ZOX:[id number] DOB: [birth year] DOA: 03/22/2018  Referring MD/NP/PA: Dr. Tomasa Rand PCP: Warren Burly, MD  Patient coming from: Rehab facility via EMS  Chief Complaint: Nausea and vomiting  I have personally briefly reviewed patient's old medical records in [place]   HPI: Warren [name] is a 84 y.o. male with medical history significant of CAD, emphysema, esophageal stricture, indwelling Foley catheter, anxiety, depression, and dementia; who presented with complaints of nausea and vomiting.  History is obtained from the patient's son present at bedside as patient has significant dementia patient had recently been hospitalized from 4/27-5/1; after presenting presented as a transfer from [facility] with sepsis secondary to urinary tract infection positive for Pseudomonas at outside facility related to an infected artificial urinary sphincter.  Dr. [name] of Urology was consulted with removal of the prosthesis on 4/27, placement of Foley catheter, and treatment with meropenem.  At discharge patient was sent to a skilled nursing facility where he had been getting stronger utilizing a walker to ambulate.  Son reported patient having upset stomach 2 days ago. Today had reportedly been having nausea and vomiting since this morning unable to keep any significant food or fluids down.  Vomitus was initially of food contents, but patient was noted to be spitting up frothy white secretions as the day went on.  He reports associated symptoms of increased work of breathing, cough, lethargy, generalized weakness, and more confused.  Patient's son reports previous history patient possibly having esophageal stricture requiring dilation at some point time.  Review of records reveals patient was evaluated and had previous barium swallow study done in 11/2013.  That time it showed delayed passage of a 13 mm barium tablet at the gastroesophageal  junction with no anatomic cause identified.  Impression at that time was the patient had moderate esophageal dysmotility thought to be possibly related to presbyesophagus. Symptomatic recommendations were given at that time.   ED Course: Upon admission into the emergency department patient was seen to be afebrile, respirations 18-36, all other vital signs maintained.  Labs revealed WBC 15.3, chloride 96, lactic acid 1.4, and all other vital signs relatively within normal limits. Urinalysis was positive for large leukocytes, rare bacteria, moderate hemoglobin, yeast present, and 21-50 WBCs.  Imaging studies revealed esophageal dilation secondary to possible dysmotility versus mass/obstruction and bladder wall thickening.  Patient was given Pepcid, 500 mL normal saline IV fluids, and meropenem.  Dr.Starks gastroenterology was consulted and will see patient in a.m.  [facility] called to admit.  Review of Systems  Unable to perform ROS: Dementia  Constitutional: Positive for malaise/fatigue. Negative for chills and fever.  HENT: Negative for ear discharge.   Respiratory: Positive for cough, sputum production and shortness of breath.   Cardiovascular: Positive for leg swelling. Negative for chest pain.  Psychiatric/Behavioral: Positive for memory loss.    Past Medical History:  Diagnosis Date  . CAD (coronary artery disease)    moderate by catheterization,11/08...essentially unchanged by repeat study,10/11  . Carotid artery disease (HCC)     less than 50% stenosis bilaterally March 2013   . Claudication ([place])     ABIs within ABIs within normal limits, 40% left iliac disease by catheterization   . Colon polyps   . Depression   . Dizzy   . Edema extremities     secondary to severe varicose veins. Nonpitting edema and dermatitis.   . Emphysema   . Glaucoma   . Hyperlipidemia   .  IBS (irritable bowel syndrome)   . Mitral regurgitation    mild  . Mixed dyslipidemia   . Peripheral edema   .  Pneumonia   . Prostate cancer [facility])    s/p radiation treatment/s/p TURP  . Shortness of breath    Emphysema  . Thyroid nodule     posterior and inferior to the right lobepossibly outside of the gland secondary to parathyroid adenoma in addition multiple bilateral thyroid nodules     Past Surgical History:  Procedure Laterality Date  . CARDIAC CATHETERIZATION  2008  . CHOLECYSTECTOMY    . CYSTOSCOPY N/A 02/27/2018   Procedure: CYSTOSCOPY;  Surgeon: Bjorn Loser, MD;  Location: [facility];  Service: Urology;  Laterality: N/A;  . INCONTINENCE SURGERY     ring inserted  . TONSILLECTOMY    . TRANSURETHRAL RESECTION OF PROSTATE     has stress urinary incontinence  . URINARY SPHINCTER IMPLANT N/A 02/27/2018   Procedure: REMOVAL INFECTED ARTIFICIAL URINARY SPHINCTER;  Surgeon: Bjorn Loser, MD;  Location: [facility];  Service: Urology;  Laterality: N/A;     reports that he quit smoking about 34 years ago. His smoking use included cigarettes. He has a 40.00 pack-year smoking history. He has never used smokeless tobacco. He reports that he does not drink alcohol or use drugs.  Allergies  Allergen Reactions  . Aspirin     REACTION: SPOTS REACTION: SPOTS  . Latex     unknown  . Penicillins     Has patient had a PCN reaction causing immediate rash, facial/tongue/throat swelling, SOB or lightheadedness with hypotension:No Has patient had a PCN reaction causing severe rash involving mucus membranes or skin necrosis: No Has patient had a PCN reaction that required hospitalization:NoHas patient had a PCN reaction occurring within the last 10 years:No If all of the above answers are "NO", then may proceed with Cephalosporin use.     Family History  Problem Relation Age of Onset  . Heart attack Sister        in her 38's  . Other Mother        lived to be 33  . Pancreatic cancer Father   . Crohn's disease Son   . Colon cancer Neg Hx     Prior to Admission medications   Medication  Sig Start Date End Date Taking? Authorizing Provider  alendronate (FOSAMAX) 70 MG tablet Take 70 mg by mouth once a week. Take 70 mg once a week in the morning with a full glass of water on an empty stomach. Do not lay down for 30 minutes 02/17/18   [provider]  CALCIUM 600 600 MG TABS tablet Take 600 mg by mouth 2 (two) times daily. 02/17/18   [provider]  Cholecalciferol (VITAMIN D3) 2000 units capsule Take 2,000 Units by mouth daily. 02/17/18   [provider]  citalopram (CELEXA) 20 MG tablet Take 20 mg by mouth daily.      [provider]  LORazepam (ATIVAN) 1 MG tablet Take 1 tablet (1 mg total) by mouth at bedtime. 03/03/18   [name], Maryann, DO  MYRBETRIQ 50 MG TB24 tablet Take 50 mg by mouth daily. 02/17/18   [provider]  pravastatin (PRAVACHOL) 10 MG tablet Take 4 tablets (40 mg total) by mouth daily. (HOLD X 2 WEEKS) 03/03/18   Cristal Ford, DO  atorvastatin (LIPITOR) 40 MG tablet Take 40 mg by mouth at bedtime.    01/27/12  [provider]  furosemide (LASIX) 20 MG  tablet Take 1 tablet (20 mg total) by mouth daily. 07/30/11 01/27/12  de Stanford Scotland, MD    Physical Exam:  Constitutional: Lethargic elderly male who awaken and follow commands. Vitals:   03/22/18 1530 03/22/18 1531 03/22/18 1847 03/22/18 1900  BP: 135/90  126/79 117/76  Pulse: 92  95 92  Resp: (!) 23  (!) 22 (!) 36  Temp:      TempSrc:      SpO2: 100%  100% 100%  Weight:  77.1 kg (170 lb)    Height:  5\' 7"  (1.702 m)     Eyes: PERRL, lids and conjunctivae normal ENMT: Mucous membranes are moist with frothy secretions. Posterior pharynx clear of any exudate or lesions.  Neck: normal, supple, no masses, no thyromegaly Respiratory: Tachypneic with decreased overall aeration, but no significant wheezing or crackles appreciated.  No accessory muscle use.  Cardiovascular: Regular rate and rhythm, no murmurs / rubs / gallops.  Trace left lower extremity edema.  2+ pedal pulses. No carotid bruits.  Abdomen: no tenderness, no masses palpated. No hepatosplenomegaly. Bowel sounds positive.  Foley catheter in place Musculoskeletal: no clubbing / cyanosis. No joint deformity upper and lower extremities. Good ROM, no contractures. Normal muscle tone.  Skin: no rashes, lesions, ulcers. No induration Neurologic: CN 2-12 grossly intact. Sensation intact, DTR normal. Strength 5/5 in all 4.  Psychiatric: Demented.  Lethargic oriented only to person.  Normal mood.     Labs on Admission: I have personally reviewed following labs and imaging studies  CBC: Recent Labs  Lab 03/22/18 1606  WBC 15.3*  NEUTROABS 13.1*  HGB 14.5  HCT 44.3  MCV 94.1  PLT 086   Basic Metabolic Panel: Recent Labs  Lab 03/22/18 1606  NA 136  K 3.9  CL 96*  CO2 28  GLUCOSE 135*  BUN 13  CREATININE 0.86  CALCIUM 9.6   GFR: Estimated Creatinine Clearance: 50.2 mL/min (by C-G formula based on SCr of 0.86 mg/dL). Liver Function Tests: Recent Labs  Lab 03/22/18 1606  AST 15  ALT 11*  ALKPHOS 47  BILITOT 1.3*  PROT 7.3  ALBUMIN 3.8   Recent Labs  Lab 03/22/18 1606  LIPASE 27   No results for input(s): AMMONIA in the last 168 hours. Coagulation Profile: Recent Labs  Lab 03/22/18 1606  INR 1.07   Cardiac Enzymes: No results for input(s): CKTOTAL, CKMB, CKMBINDEX, TROPONINI in the last 168 hours. BNP (last 3 results) No results for input(s): PROBNP in the last 8760 hours. HbA1C: No results for input(s): HGBA1C in the last 72 hours. CBG: No results for input(s): GLUCAP in the last 168 hours. Lipid Profile: No results for input(s): CHOL, HDL, LDLCALC, TRIG, CHOLHDL, LDLDIRECT in the last 72 hours. Thyroid Function Tests: No results for input(s): TSH, T4TOTAL, FREET4, T3FREE, THYROIDAB in the last 72 hours. Anemia Panel: No results for input(s): VITAMINB12, FOLATE, FERRITIN, TIBC, IRON, RETICCTPCT in the last 72 hours. Urine analysis:    Component  Value Date/Time   COLORURINE YELLOW 03/22/2018 1616   APPEARANCEUR HAZY (A) 03/22/2018 1616   LABSPEC 1.015 03/22/2018 1616   PHURINE 7.0 03/22/2018 1616   GLUCOSEU NEGATIVE 03/22/2018 1616   HGBUR MODERATE (A) 03/22/2018 1616   BILIRUBINUR NEGATIVE 03/22/2018 1616   KETONESUR NEGATIVE 03/22/2018 1616   PROTEINUR 30 (A) 03/22/2018 1616   NITRITE NEGATIVE 03/22/2018 1616   LEUKOCYTESUR LARGE (A) 03/22/2018 1616   Sepsis Labs: No results found for this or any previous visit (from the past  240 hour(s)).   Radiological Exams on Admission: Dg Chest 2 View  Result Date: 03/22/2018 CLINICAL DATA:  Difficulty swallowing for the past 24 hours. EXAM: CHEST - 2 VIEW COMPARISON:  03/02/2018 FINDINGS: The cardiac silhouette, mediastinal and hilar contours are stable. There is tortuosity and calcification of the thoracic aorta. Low lung volumes with vascular crowding and streaky bibasilar atelectasis. No definite pleural effusions. The bony thorax is grossly intact. IMPRESSION: Low lung volumes with vascular crowding and basilar atelectasis. No definite infiltrates or effusions. Electronically Signed   By: Marijo Sanes M.D.   On: 03/22/2018 17:08   Ct Chest W Contrast  Result Date: 03/22/2018 CLINICAL DATA:  Difficulty swallowing. Nausea and vomiting. Hypertension. Emphysema. EXAM: CT CHEST, ABDOMEN, AND PELVIS WITH CONTRAST TECHNIQUE: Multidetector CT imaging of the chest, abdomen and pelvis was performed following the standard protocol during bolus administration of intravenous contrast. CONTRAST:  13mL ISOVUE-300 IOPAMIDOL (ISOVUE-300) INJECTION 61% COMPARISON:  Abdominopelvic CT 02/19/2018. Chest radiograph earlier today. FINDINGS: CT CHEST FINDINGS Cardiovascular: Aortic and branch vessel atherosclerosis. 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 Normal heart size, without pericardial effusion. Multivessel coronary artery atherosclerosis. Mediastinum/Nodes: Right-sided  thyroid enlargement with a 1.2 cm nodule within, nonspecific. No mediastinal or hilar adenopathy. The esophagus is dilated and fluid-filled, including on image 18/3. No well-defined obstructive mass. Lungs/Pleura: Small left-sided pleural effusion. Bibasilar volume loss with diaphragmatic elevation and overlying subsegmental atelectasis in both lung bases. Musculoskeletal: No acute osseous abnormality. CT ABDOMEN PELVIS FINDINGS Hepatobiliary: Normal liver. Cholecystectomy, without biliary ductal dilatation. Pancreas: Normal, without mass or ductal dilatation. Spleen: Normal in size, without focal abnormality. Adrenals/Urinary Tract: Mild left adrenal nodularity. Normal right adrenal gland. Normal kidneys, without hydronephrosis. Foley catheter within the urinary bladder. The bladder is not entirely decompressed. Mildly thick walled bladder. Stomach/Bowel: Normal stomach, without wall thickening. Scattered colonic diverticula. Normal terminal ileum. Normal small bowel. Vascular/Lymphatic: Aortic and branch vessel atherosclerosis. No abdominopelvic adenopathy. Reproductive: Normal prostate. Other: No significant free fluid. Decreased anterior pelvic wall edema. No free intraperitoneal air. Musculoskeletal: Osteopenia. Presumably degenerative partial fusion of the bilateral sacroiliac joints. Lumbosacral spondylosis. IMPRESSION: 1. Fluid-filled, dilated esophagus. This could represent esophageal dysmotility or an otherwise occult obstructive lesion. Consider nonemergent esophagram. 2. No other acute process in the chest abdomen or pelvis. 3. Foley catheter within the urinary bladder. Wall thickening within the bladder may represent cystitis. Improved presumed cellulitis about the ventral pelvic wall. Lack of entire bladder decompression may suggest Foley catheter dysfunction. 4. Coronary artery atherosclerosis. Aortic Atherosclerosis (ICD10-I70.0). 5. Low lung volumes with diaphragmatic elevation bilaterally.  Electronically Signed   By: Abigail Miyamoto M.D.   On: 03/22/2018 18:51   Ct Abdomen Pelvis W Contrast  Result Date: 03/22/2018 CLINICAL DATA:  Difficulty swallowing. Nausea and vomiting. Hypertension. Emphysema. EXAM: CT CHEST, ABDOMEN, AND PELVIS WITH CONTRAST TECHNIQUE: Multidetector CT imaging of the chest, abdomen and pelvis was performed following the standard protocol during bolus administration of intravenous contrast. CONTRAST:  16mL ISOVUE-300 IOPAMIDOL (ISOVUE-300) INJECTION 61% COMPARISON:  Abdominopelvic CT 02/19/2018. Chest radiograph earlier today. FINDINGS: CT CHEST FINDINGS Cardiovascular: Aortic and branch vessel atherosclerosis. 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 Normal heart size, without pericardial effusion. Multivessel coronary artery atherosclerosis. Mediastinum/Nodes: Right-sided thyroid enlargement with a 1.2 cm nodule within, nonspecific. No mediastinal or hilar adenopathy. The esophagus is dilated and fluid-filled, including on image 18/3. No well-defined obstructive mass. Lungs/Pleura: Small left-sided pleural effusion. Bibasilar volume loss with diaphragmatic elevation and overlying subsegmental atelectasis in both  lung bases. Musculoskeletal: No acute osseous abnormality. CT ABDOMEN PELVIS FINDINGS Hepatobiliary: Normal liver. Cholecystectomy, without biliary ductal dilatation. Pancreas: Normal, without mass or ductal dilatation. Spleen: Normal in size, without focal abnormality. Adrenals/Urinary Tract: Mild left adrenal nodularity. Normal right adrenal gland. Normal kidneys, without hydronephrosis. Foley catheter within the urinary bladder. The bladder is not entirely decompressed. Mildly thick walled bladder. Stomach/Bowel: Normal stomach, without wall thickening. Scattered colonic diverticula. Normal terminal ileum. Normal small bowel. Vascular/Lymphatic: Aortic and branch vessel atherosclerosis. No abdominopelvic adenopathy. Reproductive:  Normal prostate. Other: No significant free fluid. Decreased anterior pelvic wall edema. No free intraperitoneal air. Musculoskeletal: Osteopenia. Presumably degenerative partial fusion of the bilateral sacroiliac joints. Lumbosacral spondylosis. IMPRESSION: 1. Fluid-filled, dilated esophagus. This could represent esophageal dysmotility or an otherwise occult obstructive lesion. Consider nonemergent esophagram. 2. No other acute process in the chest abdomen or pelvis. 3. Foley catheter within the urinary bladder. Wall thickening within the bladder may represent cystitis. Improved presumed cellulitis about the ventral pelvic wall. Lack of entire bladder decompression may suggest Foley catheter dysfunction. 4. Coronary artery atherosclerosis. Aortic Atherosclerosis (ICD10-I70.0). 5. Low lung volumes with diaphragmatic elevation bilaterally. Electronically Signed   By: Abigail Miyamoto M.D.   On: 03/22/2018 18:51    EKG: Independently reviewed.  Sinus rhythm at 93 bpm with similar appearance to previous EKGs.  Assessment/Plan Nausea and vomiting 2/2 suspected esophageal dysmotility: Acute.  Patient presents with vomiting unable to swallow any significant food.  Imaging studies reveal esophageal dilation.  Patient noted to have moderate esophageal dysmotility back in 2015 on barium swallow.  Suspect dysmotility vs. stricture  vs. mass vs. food obstruction .  Dr. Silvio Pate of gastroenterology consulted and practice will see in a.m. - Admit to a MedSurg bed - N.P.O  - Gentle IV fluids overnight normal saline at 75 mL/h - Zofran IV prn nausea and vomiting - Appreciate gastroenterology consultative services, follow-up for further recommendations  Foley catheter associated urinary tract infection: Acute.  Patient presents with positive urinalysis showing signs of possible infection with signs of yeast.  Patient previously noted to have  Pseudomonas - Follow-up urine culture - Continue meropenem - Diflucan 200 mg IV  x1 dose  Leukocytosis: Acute WBC elevated at 15.3.  Suspect secondary to UTI.. - Recheck CBC in a.m.  Generalized weakness 2/2 deconditioning: Acute.  Patient had been getting physically stronger while at rest, but has had a acute decline since onset of symptoms. - Will likely need to have physical therapy evaluation once acute problem addressed. - Social work consult for likely need of rehab History of coronary artery disease: Patient not on Plavix, and denies any chest pain complaints.  Anxiety and depression  - Continue Celexa when able to take p.o.  Hyperlipidemia - Continue pravastatin when able to take p.o.  Dementia: Stable  DVT prophylaxis: Lovenox   Code Status: Full Family Communication: Discussed plan of care with the patient family present at bedside Disposition Plan:  TBD Consults called: none  Admission status: inpatient   Norval Morton MD [facility] Pager [phone]   If 7PM-7AM, please contact night-coverage [email or web address] Password [facility]  03/22/2018, 7:59 PM

## 2018-03-22 NOTE — ED Notes (Signed)
Bed: WA07 Expected date:  Expected time:  Means of arrival:  Comments: EMS-dysphasia

## 2018-03-22 NOTE — ED Provider Notes (Signed)
[place] DEPT Provider Note   CSN: [id number] Arrival date & time: 03/22/18  1513     History   Chief Complaint Chief Complaint  Patient presents with  . Dysphagia    HPI Warren [name] is a 84 y.o. male.  HPI Patient developed vomiting yesterday.  His daughter reports that it seems like the food hardly goes down and is coming back up.  She reports that he also is bringing up frothy emesis even when not trying to eat.  He has not had significant pain complaints associated with this.  He has however today started coughing a large amount.  Oxygen saturation dropped down to the low 90s and nursing home placed him on 2 L supplemental oxygen.  No documented fever.  Patient's daughter does report he had a esophageal stricture about 3 years ago and required dilation.  She reports there was no particular episode yesterday when the observed him to choke on food or clear indication that he might have food impaction.  He reports today he has become very weak and low energy.  This is not typical for him.  No diarrhea.  No lower abdominal pain.  Patient's daughter reports that the esophageal dilation was done in [facility].  She reports they have not been seen by gastroenterology here in the [place] region.  Only other abdominal surgery is cholecystectomy.  Patient was recently hospitalized and discharged at the beginning of the month for complications of a urethral prosthesis.  At this time, they are not having any associated problems.  Reports the patient had been doing relatively well since his hospitalization until the symptoms came up fairly abruptly yesterday. Past Medical History:  Diagnosis Date  . CAD (coronary artery disease)    moderate by catheterization,11/08...essentially unchanged by repeat study,10/11  . Carotid artery disease (HCC)     less than 50% stenosis bilaterally March 2013   . Claudication ([place])     ABIs within ABIs within  normal limits, 40% left iliac disease by catheterization   . Colon polyps   . Depression   . Dizzy   . Edema extremities     secondary to severe varicose veins. Nonpitting edema and dermatitis.   . Emphysema   . Glaucoma   . Hyperlipidemia   . IBS (irritable bowel syndrome)   . Mitral regurgitation    mild  . Mixed dyslipidemia   . Peripheral edema   . Pneumonia   . Prostate cancer [facility])    s/p radiation treatment/s/p TURP  . Shortness of breath    Emphysema  . Thyroid nodule     posterior and inferior to the right lobepossibly outside of the gland secondary to parathyroid adenoma in addition multiple bilateral thyroid nodules     Patient Active Problem List   Diagnosis Date Noted  . Esophageal motility disorder 03/23/2018  . Urinary tract infection associated with catheterization of urinary tract, initial encounter ([place]) 03/23/2018  . Leukocytosis 03/23/2018  . Dementia 03/23/2018  . Dehydration   . Acute metabolic encephalopathy   . Nausea and vomiting 03/22/2018  . Pseudomonas urinary tract infection 03/06/2018  . Depression 03/06/2018  . Anxiety and depression 03/06/2018  . Osteoporosis 03/06/2018  . Acute lower UTI 03/01/2018  . Internal device, implant, or graft infection or inflammation ([place]) 03/01/2018  . Emphysema   . Thyroid nodule   . Carotid artery disease ([place])   . CAD (coronary artery disease)   . Edema extremities   .  Claudication ([place]) 07/30/2011  . Dyslipidemia 07/03/2010  . HYPERTENSION, BENIGN 07/03/2010  . PERIPHERAL EDEMA 07/03/2010  . SHORTNESS OF BREATH 07/03/2010  . PRECORDIAL PAIN 07/03/2010    Past Surgical History:  Procedure Laterality Date  . CARDIAC CATHETERIZATION  2008  . CHOLECYSTECTOMY    . CYSTOSCOPY N/A 02/27/2018   Procedure: CYSTOSCOPY;  Surgeon: Bjorn Loser, MD;  Location: [facility]S;  Service: Urology;  Laterality: N/A;  . INCONTINENCE SURGERY     ring inserted  . TONSILLECTOMY    . TRANSURETHRAL RESECTION OF  PROSTATE     has stress urinary incontinence  . URINARY SPHINCTER IMPLANT N/A 02/27/2018   Procedure: REMOVAL INFECTED ARTIFICIAL URINARY SPHINCTER;  Surgeon: Bjorn Loser, MD;  Location: [facility];  Service: Urology;  Laterality: N/A;        Home Medications    Prior to Admission medications   Medication Sig Start Date End Date Taking? Authorizing Provider  alendronate (FOSAMAX) 70 MG tablet Take 70 mg by mouth once a week. Take 70 mg once a week in the morning with a full glass of water on an empty stomach. Do not lay down for 30 minutes 02/17/18  Yes [provider]  bisacodyl (DULCOLAX) 10 MG suppository Place 10 mg rectally daily as needed for moderate constipation.   Yes [provider]  CALCIUM 600 600 MG TABS tablet Take 600 mg by mouth 2 (two) times daily. 02/17/18  Yes [provider]  Cholecalciferol (VITAMIN D3) 10000 units TABS Take 2,000 Units by mouth daily.   Yes [provider]  citalopram (CELEXA) 20 MG tablet Take 20 mg by mouth daily.     Yes [provider]  LORazepam (ATIVAN) 1 MG tablet Take 1 tablet (1 mg total) by mouth at bedtime. 03/03/18  Yes [name], Velta Addison, DO  magnesium hydroxide (MILK OF MAGNESIA) 400 MG/5ML suspension Take 30 mLs by mouth daily as needed for mild constipation.   Yes [provider]  MYRBETRIQ 50 MG TB24 tablet Take 50 mg by mouth daily. 02/17/18  Yes [provider]  pravastatin (PRAVACHOL) 40 MG tablet Take 40 mg by mouth daily.   Yes [provider]  pravastatin (PRAVACHOL) 10 MG tablet Take 4 tablets (40 mg total) by mouth daily. (HOLD X 2 WEEKS) Patient not taking: Reported on 03/22/2018 03/03/18   Cristal Ford, DO  atorvastatin (LIPITOR) 40 MG tablet Take 40 mg by mouth at bedtime.    01/27/12  [provider]  furosemide (LASIX) 20 MG tablet Take 1 tablet (20 mg total) by mouth daily. 07/30/11 01/27/12  de Stanford Scotland, MD    Family History Family History    Problem Relation Age of Onset  . Heart attack Sister        in her 64's  . Other Mother        lived to be 55  . Pancreatic cancer Father   . Crohn's disease Son   . Colon cancer Neg Hx     Social History Social History   Tobacco Use  . Smoking status: Former Smoker    Packs/day: 1.00    Years: 40.00    Pack years: 40.00    Types: Cigarettes    Last attempt to quit: 11/04/1983    Years since quitting: 34.4  . Smokeless tobacco: Never Used  Substance Use Topics  . Alcohol use: No  . Drug use: No     Allergies   Aspirin; Latex; and Penicillins   Review of  Systems Review of Systems 10 Systems reviewed and are negative for acute change except as noted in the HPI.   Physical Exam Updated Vital Signs BP 119/73 (BP Location: Left Arm)   Pulse 66   Temp 98.3 F (36.8 C) (Oral)   Resp 15   Ht 5\' 7"  (1.702 m)   Wt 74.8 kg (165 lb)   SpO2 97%   BMI 25.84 kg/m   Physical Exam  Constitutional: He is oriented to person, place, and time. He appears well-developed and well-nourished.  Patient is alert.  No respiratory distress at rest.  Nontoxic.  HENT:  Head: Normocephalic and atraumatic.  Mouth/Throat: Oropharynx is clear and moist.  Eyes: Pupils are equal, round, and reactive to light. EOM are normal.  Neck: Neck supple.  Cardiovascular: Normal rate, regular rhythm, normal heart sounds and intact distal pulses.  Pulmonary/Chest: Effort normal.  Breath sounds are diminished right base.  Breath sounds soft throughout.  No gross wheeze or rhonchi.  Abdominal: Soft. Bowel sounds are normal. He exhibits no distension. There is no tenderness. There is no guarding.  Musculoskeletal: Normal range of motion. He exhibits edema. He exhibits no tenderness.  1+ pitting edema bilateral ankles.  Skin changes consistent with chronic venous stasis.  No active erythema or wounds.  Neurological: He is alert and oriented to person, place, and time. He has normal strength. He exhibits  normal muscle tone. Coordination normal. GCS eye subscore is 4. GCS verbal subscore is 5. GCS motor subscore is 6.  Patient is generally weak and does require assistance for sitting forward for pulmonary exam.  He however does respond to questions.  He is alert.  He seems mildly confused at times but this has the quality of dementia and/or hard of hearing rather than delirium.  Exam is nonfocal for motor dysfunction.  Skin: Skin is warm, dry and intact.  Psychiatric: He has a normal mood and affect.     ED Treatments / Results  Labs (all labs ordered are listed, but only abnormal results are displayed) Labs Reviewed  URINE CULTURE - Abnormal; Notable for the following components:      Result Value   Culture MULTIPLE SPECIES PRESENT, SUGGEST RECOLLECTION (*)    All other components within normal limits  COMPREHENSIVE METABOLIC PANEL - Abnormal; Notable for the following components:   Chloride 96 (*)    Glucose, Bld 135 (*)    ALT 11 (*)    Total Bilirubin 1.3 (*)    All other components within normal limits  CBC WITH DIFFERENTIAL/PLATELET - Abnormal; Notable for the following components:   WBC 15.3 (*)    Neutro Abs 13.1 (*)    Monocytes Absolute 1.1 (*)    All other components within normal limits  URINALYSIS, ROUTINE W REFLEX MICROSCOPIC - Abnormal; Notable for the following components:   APPearance HAZY (*)    Hgb urine dipstick MODERATE (*)    Protein, ur 30 (*)    Leukocytes, UA LARGE (*)    Bacteria, UA RARE (*)    All other components within normal limits  CBC - Abnormal; Notable for the following components:   RBC 3.86 (*)    Hemoglobin 11.8 (*)    HCT 36.7 (*)    All other components within normal limits  BASIC METABOLIC PANEL - Abnormal; Notable for the following components:   Glucose, Bld 109 (*)    Calcium 8.3 (*)    All other components within normal limits  GLUCOSE, CAPILLARY - Abnormal;  Notable for the following components:   Glucose-Capillary 103 (*)    All  other components within normal limits  CBC WITH DIFFERENTIAL/PLATELET - Abnormal; Notable for the following components:   RBC 3.84 (*)    Hemoglobin 11.6 (*)    HCT 36.6 (*)    All other components within normal limits  BASIC METABOLIC PANEL - Abnormal; Notable for the following components:   Glucose, Bld 109 (*)    Calcium 7.9 (*)    All other components within normal limits  GLUCOSE, CAPILLARY - Abnormal; Notable for the following components:   Glucose-Capillary 100 (*)    All other components within normal limits  GLUCOSE, CAPILLARY - Abnormal; Notable for the following components:   Glucose-Capillary 103 (*)    All other components within normal limits  GLUCOSE, CAPILLARY - Abnormal; Notable for the following components:   Glucose-Capillary 112 (*)    All other components within normal limits  GLUCOSE, CAPILLARY - Abnormal; Notable for the following components:   Glucose-Capillary 105 (*)    All other components within normal limits  GLUCOSE, CAPILLARY - Abnormal; Notable for the following components:   Glucose-Capillary 107 (*)    All other components within normal limits  GLUCOSE, CAPILLARY - Abnormal; Notable for the following components:   Glucose-Capillary 119 (*)    All other components within normal limits  CBC WITH DIFFERENTIAL/PLATELET - Abnormal; Notable for the following components:   RBC 3.67 (*)    Hemoglobin 11.0 (*)    HCT 34.8 (*)    All other components within normal limits  BASIC METABOLIC PANEL - Abnormal; Notable for the following components:   Glucose, Bld 105 (*)    Creatinine, Ser 0.58 (*)    Calcium 7.9 (*)    All other components within normal limits  GLUCOSE, CAPILLARY - Abnormal; Notable for the following components:   Glucose-Capillary 147 (*)    All other components within normal limits  GLUCOSE, CAPILLARY - Abnormal; Notable for the following components:   Glucose-Capillary 117 (*)    All other components within normal limits  CULTURE,  BLOOD (ROUTINE X 2)  CULTURE, BLOOD (ROUTINE X 2)  PROTIME-INR  LIPASE, BLOOD  BRAIN NATRIURETIC PEPTIDE  GLUCOSE, CAPILLARY  GLUCOSE, CAPILLARY  GLUCOSE, CAPILLARY  I-STAT CG4 LACTIC ACID, ED  I-STAT TROPONIN, ED  I-STAT CG4 LACTIC ACID, ED  SURGICAL PATHOLOGY    EKG EKG Interpretation  Date/Time:  Monday Mar 22 2018 15:45:39 EDT Ventricular Rate:  93 PR Interval:    QRS Duration: 97 QT Interval:  356 QTC Calculation: 443 R Axis:   -60 Text Interpretation:  Sinus rhythm LAD, consider left anterior fascicular block Borderline low voltage, extremity leads RSR' in V1 or V2, right VCD or RVH no change from previous Confirmed by Charlesetta Shanks [phone]) on 03/22/2018 5:57:58 PM   Radiology No results found.  Procedures Procedures (including critical care time)  Medications Ordered in ED Medications  iopamidol (ISOVUE-300) 61 % injection (has no administration in time range)  famotidine (PEPCID) IVPB 20 mg premix (0 mg Intravenous Stopped 03/24/18 2153)  meropenem (MERREM) 1 g in sodium chloride 0.9 % 100 mL IVPB (0 g Intravenous Stopped 03/25/18 0700)  bisacodyl (DULCOLAX) suppository 10 mg ( Rectal MAR Unhold 03/24/18 1012)  LORazepam (ATIVAN) injection 0.5 mg ( Intravenous MAR Unhold 03/24/18 1012)  enoxaparin (LOVENOX) injection 40 mg (40 mg Subcutaneous Given 03/24/18 2145)  ipratropium-albuterol (DUONEB) 0.5-2.5 (3) MG/3ML nebulizer solution 3 mL ( Nebulization MAR Unhold 03/24/18 1012)  ondansetron (ZOFRAN) tablet 4 mg ( Oral MAR Unhold 03/24/18 1012)    Or  ondansetron (ZOFRAN) injection 4 mg ( Intravenous MAR Unhold 03/24/18 1012)  MEDLINE mouth rinse (15 mLs Mouth Rinse Given 03/24/18 2117)  fluconazole (DIFLUCAN) IVPB 100 mg ( Intravenous Restarted 03/24/18 1329)  oxybutynin (DITROPAN) tablet 5 mg (5 mg Oral Given 03/24/18 2116)  iopamidol (ISOVUE-300) 61 % injection 100 mL (100 mLs Intravenous Contrast Given 03/22/18 1815)  sodium chloride 0.9 % bolus 500 mL (0 mLs  Intravenous Stopped 03/22/18 2016)  0.9 %  sodium chloride infusion ( Intravenous Stopped 03/22/18 2242)  fluconazole (DIFLUCAN) IVPB 200 mg (0 mg Intravenous Stopped 03/23/18 0035)  0.9 %  sodium chloride infusion ( Intravenous New Bag/Given 03/22/18 2242)  sodium chloride 0.9 % bolus 500 mL (0 mLs Intravenous Stopped 03/23/18 1439)     Initial Impression / Assessment and Plan / ED Course  I have reviewed the triage vital signs and the nursing notes.  Pertinent labs & imaging results that were available during my care of the patient were reviewed by me and considered in my medical decision making (see chart for details).    Consult: (19: 35) reviewed with Dr. Fuller Plan of gastroenterology.  Will plan to do consultation in a.m.  Can notify if any acute changes. Consult:[facility] Dr. Tamala Julian admit.  We will continue meropenem for coverage of known, previous Pseudomonas UTI. Final Clinical Impressions(s) / ED Diagnoses   Final diagnoses:  Esophageal dysfunction  Intractable vomiting with nausea, unspecified vomiting type  Lethargy  Dehydration   She had fairly acute change yesterday.  Family members nor other staff have observed episodes that they feels consistent with a food impaction.  Patient was never observed to eat and then unable to swallow.  He however has known history of esophageal stricture.  He had been vomiting for the past 12 to 24 hours.  Family members also describes some frothy emesis.  CT has ruled out bowel obstruction or other immediate surgical cause.  Esophageal findings noted without obvious obstruction.  Doubt food impaction given history and CT findings.  Consultation has been placed to gastroenterology for further evaluation.  Plan will be for admission to medical service for dehydration, mental status change with lethargy and inability to tolerate oral intake. ED Discharge Orders    None       Charlesetta Shanks, MD 03/25/18 [phone]

## 2018-03-22 NOTE — Progress Notes (Signed)
Location:  [place] Room Number: 100-P Place of Service:  SNF (31)  Warren [name]. Warren Coil, MD  Patient Care Team: Warren Burly, MD as PCP - General (Unknown Physician Specialty)  Extended Emergency Contact Information Primary Emergency Contact: Warren, [name] Home Phone: [phone] Mobile Phone: [phone] Relation: Son Secondary Emergency Contact: Warren [name] Mobile Phone: [phone] Relation: Daughter    Allergies: Aspirin; Latex; and Penicillins  Chief Complaint  Patient presents with  . Acute Visit    Fever and Nausea    HPI: Patient is 84 y.o. male who think seen acutely.  Yesterday a.m. patient had chest congestion last night he ran a low-grade fever.  Today he has been vomiting.  No diarrhea, chest pain shortness of breath, heartburn cough, lower extremity edema, or urinary symptoms.  On further questioning it appears that when patient vomits he actually looks like he is regurgitating his food and even liquid.  Past Medical History:  Diagnosis Date  . CAD (coronary artery disease)    moderate by catheterization,11/08...essentially unchanged by repeat study,10/11  . Carotid artery disease (HCC)     less than 50% stenosis bilaterally March 2013   . Claudication ([place])     ABIs within ABIs within normal limits, 40% left iliac disease by catheterization   . Colon polyps   . Depression   . Dizzy   . Edema extremities     secondary to severe varicose veins. Nonpitting edema and dermatitis.   . Emphysema   . Glaucoma   . Hyperlipidemia   . IBS (irritable bowel syndrome)   . Mitral regurgitation    mild  . Mixed dyslipidemia   . Peripheral edema   . Pneumonia   . Prostate cancer [facility])    s/p radiation treatment/s/p TURP  . Shortness of breath    Emphysema  . Thyroid nodule     posterior and inferior to the right lobepossibly outside of the gland secondary to parathyroid adenoma in addition multiple bilateral  thyroid nodules     Past Surgical History:  Procedure Laterality Date  . CARDIAC CATHETERIZATION  2008  . CHOLECYSTECTOMY    . CYSTOSCOPY N/A 02/27/2018   Procedure: CYSTOSCOPY;  Surgeon: Warren Loser, MD;  Location: [facility];  Service: Urology;  Laterality: N/A;  . INCONTINENCE SURGERY     ring inserted  . TONSILLECTOMY    . TRANSURETHRAL RESECTION OF PROSTATE     has stress urinary incontinence  . URINARY SPHINCTER IMPLANT N/A 02/27/2018   Procedure: REMOVAL INFECTED ARTIFICIAL URINARY SPHINCTER;  Surgeon: Warren Loser, MD;  Location: [facility];  Service: Urology;  Laterality: N/A;    Allergies as of 03/22/2018      Reactions   Aspirin    REACTION: SPOTS REACTION: SPOTS   Latex    unknown   Penicillins    Has patient had a PCN reaction causing immediate rash, facial/tongue/throat swelling, SOB or lightheadedness with hypotension:No Has patient had a PCN reaction causing severe rash involving mucus membranes or skin necrosis: No Has patient had a PCN reaction that required hospitalization:NoHas patient had a PCN reaction occurring within the last 10 years:No If all of the above answers are "NO", then may proceed with Cephalosporin use.      Medication List        Accurate as of 03/22/18  3:28 PM. Always use your most recent med list.          alendronate 70 MG tablet Commonly known as:  FOSAMAX Take 70 mg by mouth once a week. Take 70 mg once a week in the morning with a full glass of water on an empty stomach. Do not lay down for 30 minutes   CALCIUM 600 600 MG Tabs tablet Generic drug:  calcium carbonate Take 600 mg by mouth 2 (two) times daily.   citalopram 20 MG tablet Commonly known as:  CELEXA Take 20 mg by mouth daily.   LORazepam 1 MG tablet Commonly known as:  ATIVAN Take 1 tablet (1 mg total) by mouth at bedtime.   MYRBETRIQ 50 MG Tb24 tablet Generic drug:  mirabegron ER Take 50 mg by mouth daily.   pravastatin 10 MG tablet Commonly known as:   PRAVACHOL Take 4 tablets (40 mg total) by mouth daily. (HOLD X 2 WEEKS)   Vitamin D3 2000 units capsule Take 2,000 Units by mouth daily.       No orders of the defined types were placed in this encounter.    There is no immunization history on file for this patient.  Social History   Tobacco Use  . Smoking status: Former Smoker    Packs/day: 1.00    Years: 40.00    Pack years: 40.00    Types: Cigarettes    Last attempt to quit: 11/04/1983    Years since quitting: 34.4  . Smokeless tobacco: Never Used  Substance Use Topics  . Alcohol use: No    Review of Systems  DATA OBTAINED: from patient, nurse GENERAL: Low-grade fevers, fatigue, appetite changes SKIN: No itching, rash HEENT: No complaint RESPIRATORY: No cough, wheezing, SOB CARDIAC: No chest pain, palpitations, lower extremity edema  GI: No abdominal pain, + N/V, no diarrhea or constipation, No heartburn or reflux  GU: No dysuria, frequency or urgency, or incontinence  MUSCULOSKELETAL: No unrelieved bone/joint pain NEUROLOGIC: No headache, dizziness  PSYCHIATRIC: No overt anxiety or sadness  Vitals:   03/22/18 1518  BP: 130/81  Pulse: 68  Resp: 18  Temp: (!) 97.3 F (36.3 C)  SpO2: 93%   There is no height or weight on file to calculate BMI. Physical Exam  GENERAL APPEARANCE: Alert, conversant, No acute distress  SKIN: No diaphoresis rash HEENT: Unremarkable RESPIRATORY: Breathing is even, unlabored. Lung sounds are clear   CARDIOVASCULAR: Heart RRR no murmurs, rubs or gallops. No peripheral edema  GASTROINTESTINAL: Abdomen is soft, non-tender, not distended w/ normal bowel sounds.  GENITOURINARY: Bladder non tender, not distended  MUSCULOSKELETAL: No abnormal joints or musculature NEUROLOGIC: Cranial nerves 2-12 grossly intact. Moves all extremities PSYCHIATRIC: Mood and affect appropriate to situation, no behavioral issues  Patient Active Problem List   Diagnosis Date Noted  . Pseudomonas  urinary tract infection 03/06/2018  . Depression 03/06/2018  . Anxiety 03/06/2018  . Osteoporosis 03/06/2018  . Acute lower UTI 03/01/2018  . Internal device, implant, or graft infection or inflammation ([place]) 03/01/2018  . Emphysema   . Thyroid nodule   . Carotid artery disease ([place])   . CAD (coronary artery disease)   . Edema extremities   . Claudication ([place]) 07/30/2011  . DYSLIPIDEMIA 07/03/2010  . HYPERTENSION, BENIGN 07/03/2010  . PERIPHERAL EDEMA 07/03/2010  . SHORTNESS OF BREATH 07/03/2010  . PRECORDIAL PAIN 07/03/2010    CMP     Component Value Date/Time   NA 138 03/01/2018 0456   K 4.1 03/01/2018 0456   CL 98 (L) 03/01/2018 0456   CO2 32 03/01/2018 0456   GLUCOSE 115 (H) 03/01/2018 0456   BUN 16 03/01/2018  0456   CREATININE 0.63 03/01/2018 0456   CALCIUM 8.7 (L) 03/01/2018 0456   PROT 5.6 (L) 03/01/2018 0456   ALBUMIN 2.6 (L) 03/01/2018 0456   AST 19 03/01/2018 0456   ALT 16 (L) 03/01/2018 0456   ALKPHOS 33 (L) 03/01/2018 0456   BILITOT 0.4 03/01/2018 0456   GFRNONAA >60 03/01/2018 0456   GFRAA >60 03/01/2018 0456   Recent Labs    02/26/18 2030 02/28/18 0511 03/01/18 0456  NA 137 137 138  K 3.2* 4.1 4.1  CL 96* 96* 98*  CO2 29 30 32  GLUCOSE 162* 141* 115*  BUN 9 13 16   CREATININE 0.81 0.73 0.63  CALCIUM 9.0 8.6* 8.7*   Recent Labs    02/26/18 2030 02/28/18 0511 03/01/18 0456  AST 17 11* 19  ALT 15* 11* 16*  ALKPHOS 40 31* 33*  BILITOT 0.7 0.5 0.4  PROT 6.4* 5.5* 5.6*  ALBUMIN 3.0* 2.5* 2.6*   Recent Labs    02/26/18 2030 02/28/18 0511 03/01/18 0456  WBC 11.3* 9.8 8.8  NEUTROABS  --  8.6* 6.3  HGB 14.5 11.6* 11.6*  HCT 43.0 36.1* 36.4*  MCV 92.9 94.3 94.8  PLT 228 267 257   No results for input(s): CHOL, LDLCALC, TRIG in the last 8760 hours.  Invalid input(s): HCL No results found for: MICROALBUR No results found for: TSH No results found for: HGBA1C No results found for: CHOL, HDL, LDLCALC, LDLDIRECT, TRIG,  CHOLHDL  Significant Diagnostic Results in last 30 days:  Dg Chest 2 View  Result Date: 03/02/2018 CLINICAL DATA:  Cough and chest congestion for the past 3-4 days. EXAM: CHEST - 2 VIEW COMPARISON:  02/19/2018. FINDINGS: Again demonstrated is a poor inspiration. Mild bibasilar atelectasis. Normal sized heart. Tortuous and calcified thoracic aorta. Mild diffuse peribronchial thickening. Mild thoracic spine degenerative changes. Diffuse osteopenia. Cholecystectomy clips. Gas and fluid distended stomach. IMPRESSION: 1. Poor inspiration with mild bibasilar atelectasis. 2. Mild bronchitic changes. 3. Gastric distention. Electronically Signed   By: Claudie Revering M.D.   On: 03/02/2018 21:08    Assessment and Plan  Low-grade fever/possible regurgitation/esophageal dysmotility or foreign body- I had speech therapy evaluate the patient and she to felt that patient was regurgitating; she had been working with patient; it appears that patient either has a foreign body or some sort of esophageal dysmotility and that is why is regurgitating; since patient cannot keep down fluids will need to send patient to the emergency department for evaluation.   Time spent greater than 35 minutes Danner Paulding D. Warren Coil, MD

## 2018-03-22 NOTE — Progress Notes (Signed)
Pharmacy Antibiotic Note  Warren [name] is a 84 y.o. male admitted on 03/22/2018 with UTI & vomiting. He is from SNF with indwelling foley catheter.  He was recently treated with Meropenem>>PO Cipro for Pseudomonas catheter infection.  UA suggests infection today. Pharmacy has been consulted for Meropenem dosing.  Plan: Meropenem 1gm IV q8h Monitor renal function and cx data   Height: 5\' 7"  (170.2 cm) Weight: 170 lb (77.1 kg) IBW/kg (Calculated) : 66.1  Temp (24hrs), Avg:97.9 F (36.6 C), Min:97.3 F (36.3 C), Max:98.5 F (36.9 C)  Recent Labs  Lab 03/22/18 1606 03/22/18 1611  WBC 15.3*  --   CREATININE 0.86  --   LATICACIDVEN  --  1.40    Estimated Creatinine Clearance: 50.2 mL/min (by C-G formula based on SCr of 0.86 mg/dL).    Allergies  Allergen Reactions  . Aspirin     REACTION: SPOTS REACTION: SPOTS  . Latex     unknown  . Penicillins     Has patient had a PCN reaction causing immediate rash, facial/tongue/throat swelling, SOB or lightheadedness with hypotension:No Has patient had a PCN reaction causing severe rash involving mucus membranes or skin necrosis: No Has patient had a PCN reaction that required hospitalization:NoHas patient had a PCN reaction occurring within the last 10 years:No If all of the above answers are "NO", then may proceed with Cephalosporin use.     Antimicrobials this admission: 5/20 Meropenem >>   Dose adjustments this admission:  Microbiology results: 5/20 BCx:  5/20 UCx:    Thank you for allowing pharmacy to be a part of this patient's care.  Biagio Borg 03/22/2018 9:58 PM

## 2018-03-22 NOTE — ED Triage Notes (Addendum)
Pt to ED via [facility] with complaints of difficulty swallowing for the last 24 hours. Per EMS food is the only thing patient is having trouble swallowing, which is not normal. Pt was able to take his medications and liquids. Pt has been throwing up his food and frothy mucus since yesterday and oxygen saturation dropped 93% so they facility placed on oxygen.  Pt has hx of HTN, anxiety, and emphysema.  Pt daughter also complained that he was having more difficulty breathing the last couple of days so facility did Xray today which showed no pnemonia.  Pt can get confused about where he is as well at his [facility]. Pt does have foley catheter in place.  Pt had a artifical urinary sphincter removed 02/27/18 due to infection here at [facility] and was her until 03/04/18.

## 2018-03-22 NOTE — ED Notes (Signed)
ED TO INPATIENT HANDOFF REPORT  Name/Age/Gender Warren [name] 84 y.o. male  Code Status    Code Status Orders  (From admission, onward)        Start     Ordered   03/22/18 2140  Full code  Continuous     03/22/18 2142    Code Status History    Date Active Date Inactive Code Status Order ID Comments User Context   02/26/2018 1939 03/04/2018 0138 Full Code [id number]  Bethena Roys, MD Inpatient    Advance Directive Documentation     Most Recent Value  Type of Advance Directive  Healthcare Power of Attorney, Living will  Pre-existing out of facility DNR order (yellow form or pink MOST form)  -  "MOST" Form in Place?  -      Home/SNF/Other Skilled nursing facility  Chief Complaint dysphasia  Level of Care/Admitting Diagnosis ED Disposition    ED Disposition Condition [place]: [facility] [[id number]]  Level of Care: Med-Surg [16]  Diagnosis: Vomiting [[id number]]  Admitting Physician: Norval Morton [[id number]]  Attending Physician: Norval Morton [[id number]]  Estimated length of stay: past midnight tomorrow  Certification:: I certify this patient will need inpatient services for at least 2 midnights  PT Class (Do Not Modify): Inpatient [101]  PT Acc Code (Do Not Modify): Private [1]       Medical History Past Medical History:  Diagnosis Date  . CAD (coronary artery disease)    moderate by catheterization,11/08...essentially unchanged by repeat study,10/11  . Carotid artery disease (HCC)     less than 50% stenosis bilaterally March 2013   . Claudication ([place])     ABIs within ABIs within normal limits, 40% left iliac disease by catheterization   . Colon polyps   . Depression   . Dizzy   . Edema extremities     secondary to severe varicose veins. Nonpitting edema and dermatitis.   . Emphysema   . Glaucoma   . Hyperlipidemia   . IBS (irritable bowel syndrome)   . Mitral regurgitation    mild  . Mixed  dyslipidemia   . Peripheral edema   . Pneumonia   . Prostate cancer [facility])    s/p radiation treatment/s/p TURP  . Shortness of breath    Emphysema  . Thyroid nodule     posterior and inferior to the right lobepossibly outside of the gland secondary to parathyroid adenoma in addition multiple bilateral thyroid nodules     Allergies Allergies  Allergen Reactions  . Aspirin     REACTION: SPOTS REACTION: SPOTS  . Latex     unknown  . Penicillins     Has patient had a PCN reaction causing immediate rash, facial/tongue/throat swelling, SOB or lightheadedness with hypotension:No Has patient had a PCN reaction causing severe rash involving mucus membranes or skin necrosis: No Has patient had a PCN reaction that required hospitalization:NoHas patient had a PCN reaction occurring within the last 10 years:No If all of the above answers are "NO", then may proceed with Cephalosporin use.     IV Location/Drains/Wounds Patient Lines/Drains/Airways Status   Active Line/Drains/Airways    Name:   Placement date:   Placement time:   Site:   Days:   Peripheral IV 03/22/18 Left Antecubital   03/22/18    1614    Antecubital   less than 1   Urethral Catheter Dr. [name] Straight-tip;Double-lumen;Non-latex 14 Fr.   02/27/18  1015    Straight-tip;Double-lumen;Non-latex   23   Incision (Closed) 02/27/18 Perineum Other (Comment)   02/27/18    1051     23   Incision (Closed) 02/27/18 Abdomen Right   02/27/18    1056     23          Labs/Imaging Results for orders placed or performed during the hospital encounter of 03/22/18 (from the past 48 hour(s))  Comprehensive metabolic panel     Status: Abnormal   Collection Time: 03/22/18  4:06 PM  Result Value Ref Range   Sodium 136 135 - 145 mmol/L   Potassium 3.9 3.5 - 5.1 mmol/L   Chloride 96 (L) 101 - 111 mmol/L   CO2 28 22 - 32 mmol/L   Glucose, Bld 135 (H) 65 - 99 mg/dL   BUN 13 6 - 20 mg/dL   Creatinine, Ser 0.86 0.61 - 1.24 mg/dL   Calcium  9.6 8.9 - 10.3 mg/dL   Total Protein 7.3 6.5 - 8.1 g/dL   Albumin 3.8 3.5 - 5.0 g/dL   AST 15 15 - 41 U/L   ALT 11 (L) 17 - 63 U/L   Alkaline Phosphatase 47 38 - 126 U/L   Total Bilirubin 1.3 (H) 0.3 - 1.2 mg/dL   GFR calc non Af Amer >60 >60 mL/min   GFR calc Af Amer >60 >60 mL/min    Comment: (NOTE) The eGFR has been calculated using the CKD EPI equation. This calculation has not been validated in all clinical situations. eGFR's persistently <60 mL/min signify possible Chronic Kidney Disease.    Anion gap 12 5 - 15    Comment: Performed at [facility], [place] [address] [place], [place] [address]  CBC with Differential     Status: Abnormal   Collection Time: 03/22/18  4:06 PM  Result Value Ref Range   WBC 15.3 (H) 4.0 - 10.5 K/uL   RBC 4.71 4.22 - 5.81 MIL/uL   Hemoglobin 14.5 13.0 - 17.0 g/dL   HCT 44.3 39.0 - 52.0 %   MCV 94.1 78.0 - 100.0 fL   MCH 30.8 26.0 - 34.0 pg   MCHC 32.7 30.0 - 36.0 g/dL   RDW 14.9 11.5 - 15.5 %   Platelets 216 150 - 400 K/uL   Neutrophils Relative % 86 %   Neutro Abs 13.1 (H) 1.7 - 7.7 K/uL   Lymphocytes Relative 7 %   Lymphs Abs 1.0 0.7 - 4.0 K/uL   Monocytes Relative 7 %   Monocytes Absolute 1.1 (H) 0.1 - 1.0 K/uL   Eosinophils Relative 0 %   Eosinophils Absolute 0.0 0.0 - 0.7 K/uL   Basophils Relative 0 %   Basophils Absolute 0.0 0.0 - 0.1 K/uL    Comment: Performed at [facility], [place] [address] [place], [place] [address]  Protime-INR     Status: None   Collection Time: 03/22/18  4:06 PM  Result Value Ref Range   Prothrombin Time 13.8 11.4 - 15.2 seconds   INR 1.07     Comment: Performed at [facility], [place] [address] [place], [place] [address]  Lipase, blood     Status: None   Collection Time: 03/22/18  4:06 PM  Result Value Ref Range   Lipase 27 11 - 51 U/L    Comment: Performed at [facility], [place] [address] [place], [place] [address]  Brain  natriuretic peptide     Status: None  Collection Time: 03/22/18  4:06 PM  Result Value Ref Range   B Natriuretic Peptide 39.0 0.0 - 100.0 pg/mL    Comment: Performed at [facility], [place] [address], [place], [place] [address]  I-Stat CG4 Lactic Acid, ED     Status: None   Collection Time: 03/22/18  4:11 PM  Result Value Ref Range   Lactic Acid, Venous 1.40 0.5 - 1.9 mmol/L  I-stat troponin, ED     Status: None   Collection Time: 03/22/18  4:15 PM  Result Value Ref Range   Troponin i, poc 0.00 0.00 - 0.08 ng/mL   Comment 3            Comment: Due to the release kinetics of cTnI, a negative result within the first hours of the onset of symptoms does not rule out myocardial infarction with certainty. If myocardial infarction is still suspected, repeat the test at appropriate intervals.   Urinalysis, Routine w reflex microscopic     Status: Abnormal   Collection Time: 03/22/18  4:16 PM  Result Value Ref Range   Color, Urine YELLOW YELLOW   APPearance HAZY (A) CLEAR   Specific Gravity, Urine 1.015 1.005 - 1.030   pH 7.0 5.0 - 8.0   Glucose, UA NEGATIVE NEGATIVE mg/dL   Hgb urine dipstick MODERATE (A) NEGATIVE   Bilirubin Urine NEGATIVE NEGATIVE   Ketones, ur NEGATIVE NEGATIVE mg/dL   Protein, ur 30 (A) NEGATIVE mg/dL   Nitrite NEGATIVE NEGATIVE   Leukocytes, UA LARGE (A) NEGATIVE   RBC / HPF 11-20 0 - 5 RBC/hpf   WBC, UA 21-50 0 - 5 WBC/hpf   Bacteria, UA RARE (A) NONE SEEN   Squamous Epithelial / LPF 0-5 0 - 5   Mucus PRESENT    Budding Yeast PRESENT     Comment: Performed at [facility], [place] [address], [place], [place] [address]   Dg Chest 2 View  Result Date: 03/22/2018 CLINICAL DATA:  Difficulty swallowing for the past 24 hours. EXAM: CHEST - 2 VIEW COMPARISON:  03/02/2018 FINDINGS: The cardiac silhouette, mediastinal and hilar contours are stable. There is tortuosity and calcification of the thoracic aorta. Low lung volumes with  vascular crowding and streaky bibasilar atelectasis. No definite pleural effusions. The bony thorax is grossly intact. IMPRESSION: Low lung volumes with vascular crowding and basilar atelectasis. No definite infiltrates or effusions. Electronically Signed   By: Marijo Sanes M.D.   On: 03/22/2018 17:08   Ct Chest W Contrast  Result Date: 03/22/2018 CLINICAL DATA:  Difficulty swallowing. Nausea and vomiting. Hypertension. Emphysema. EXAM: CT CHEST, ABDOMEN, AND PELVIS WITH CONTRAST TECHNIQUE: Multidetector CT imaging of the chest, abdomen and pelvis was performed following the standard protocol during bolus administration of intravenous contrast. CONTRAST:  147m ISOVUE-300 IOPAMIDOL (ISOVUE-300) INJECTION 61% COMPARISON:  Abdominopelvic CT 02/19/2018. Chest radiograph earlier today. FINDINGS: CT CHEST FINDINGS Cardiovascular: Aortic and branch vessel atherosclerosis. 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 Normal heart size, without pericardial effusion. Multivessel coronary artery atherosclerosis. Mediastinum/Nodes: Right-sided thyroid enlargement with a 1.2 cm nodule within, nonspecific. No mediastinal or hilar adenopathy. The esophagus is dilated and fluid-filled, including on image 18/3. No well-defined obstructive mass. Lungs/Pleura: Small left-sided pleural effusion. Bibasilar volume loss with diaphragmatic elevation and overlying subsegmental atelectasis in both lung bases. Musculoskeletal: No acute osseous abnormality. CT ABDOMEN PELVIS FINDINGS Hepatobiliary: Normal liver. Cholecystectomy, without biliary ductal dilatation. Pancreas: Normal, without mass or ductal dilatation. Spleen: Normal in size, without focal  abnormality. Adrenals/Urinary Tract: Mild left adrenal nodularity. Normal right adrenal gland. Normal kidneys, without hydronephrosis. Foley catheter within the urinary bladder. The bladder is not entirely decompressed. Mildly thick walled bladder. Stomach/Bowel:  Normal stomach, without wall thickening. Scattered colonic diverticula. Normal terminal ileum. Normal small bowel. Vascular/Lymphatic: Aortic and branch vessel atherosclerosis. No abdominopelvic adenopathy. Reproductive: Normal prostate. Other: No significant free fluid. Decreased anterior pelvic wall edema. No free intraperitoneal air. Musculoskeletal: Osteopenia. Presumably degenerative partial fusion of the bilateral sacroiliac joints. Lumbosacral spondylosis. IMPRESSION: 1. Fluid-filled, dilated esophagus. This could represent esophageal dysmotility or an otherwise occult obstructive lesion. Consider nonemergent esophagram. 2. No other acute process in the chest abdomen or pelvis. 3. Foley catheter within the urinary bladder. Wall thickening within the bladder may represent cystitis. Improved presumed cellulitis about the ventral pelvic wall. Lack of entire bladder decompression may suggest Foley catheter dysfunction. 4. Coronary artery atherosclerosis. Aortic Atherosclerosis (ICD10-I70.0). 5. Low lung volumes with diaphragmatic elevation bilaterally. Electronically Signed   By: Abigail Miyamoto M.D.   On: 03/22/2018 18:51   Ct Abdomen Pelvis W Contrast  Result Date: 03/22/2018 CLINICAL DATA:  Difficulty swallowing. Nausea and vomiting. Hypertension. Emphysema. EXAM: CT CHEST, ABDOMEN, AND PELVIS WITH CONTRAST TECHNIQUE: Multidetector CT imaging of the chest, abdomen and pelvis was performed following the standard protocol during bolus administration of intravenous contrast. CONTRAST:  142m ISOVUE-300 IOPAMIDOL (ISOVUE-300) INJECTION 61% COMPARISON:  Abdominopelvic CT 02/19/2018. Chest radiograph earlier today. FINDINGS: CT CHEST FINDINGS Cardiovascular: Aortic and branch vessel atherosclerosis. 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 Normal heart size, without pericardial effusion. Multivessel coronary artery atherosclerosis. Mediastinum/Nodes: Right-sided thyroid enlargement  with a 1.2 cm nodule within, nonspecific. No mediastinal or hilar adenopathy. The esophagus is dilated and fluid-filled, including on image 18/3. No well-defined obstructive mass. Lungs/Pleura: Small left-sided pleural effusion. Bibasilar volume loss with diaphragmatic elevation and overlying subsegmental atelectasis in both lung bases. Musculoskeletal: No acute osseous abnormality. CT ABDOMEN PELVIS FINDINGS Hepatobiliary: Normal liver. Cholecystectomy, without biliary ductal dilatation. Pancreas: Normal, without mass or ductal dilatation. Spleen: Normal in size, without focal abnormality. Adrenals/Urinary Tract: Mild left adrenal nodularity. Normal right adrenal gland. Normal kidneys, without hydronephrosis. Foley catheter within the urinary bladder. The bladder is not entirely decompressed. Mildly thick walled bladder. Stomach/Bowel: Normal stomach, without wall thickening. Scattered colonic diverticula. Normal terminal ileum. Normal small bowel. Vascular/Lymphatic: Aortic and branch vessel atherosclerosis. No abdominopelvic adenopathy. Reproductive: Normal prostate. Other: No significant free fluid. Decreased anterior pelvic wall edema. No free intraperitoneal air. Musculoskeletal: Osteopenia. Presumably degenerative partial fusion of the bilateral sacroiliac joints. Lumbosacral spondylosis. IMPRESSION: 1. Fluid-filled, dilated esophagus. This could represent esophageal dysmotility or an otherwise occult obstructive lesion. Consider nonemergent esophagram. 2. No other acute process in the chest abdomen or pelvis. 3. Foley catheter within the urinary bladder. Wall thickening within the bladder may represent cystitis. Improved presumed cellulitis about the ventral pelvic wall. Lack of entire bladder decompression may suggest Foley catheter dysfunction. 4. Coronary artery atherosclerosis. Aortic Atherosclerosis (ICD10-I70.0). 5. Low lung volumes with diaphragmatic elevation bilaterally. Electronically Signed   By:  KAbigail MiyamotoM.D.   On: 03/22/2018 18:51    Pending Labs Unresulted Labs (From admission, onward)   Start     Ordered   03/23/18 0500  CBC  Tomorrow morning,   R     03/22/18 2142   03/23/18 03704 Basic metabolic panel  Tomorrow morning,   R     03/22/18 2142   03/22/18 1612  Urine culture  STAT,  STAT     03/22/18 1612   03/22/18 1603  Culture, blood (Routine x 2)  BLOOD CULTURE X 2,   STAT     03/22/18 1603      Vitals/Pain Today's Vitals   03/22/18 1930 03/22/18 2000 03/22/18 2030 03/22/18 2130  BP: 117/79 122/78 127/77 (!) 127/91  Pulse: 92 89 88 84  Resp: (!) 28 (!) 27 (!) 27 (!) 24  Temp:      TempSrc:      SpO2: 98% 100% 99% 100%  Weight:      Height:      PainSc:    0-No pain    Isolation Precautions No active isolations  Medications Medications  iopamidol (ISOVUE-300) 61 % injection (has no administration in time range)  famotidine (PEPCID) IVPB 20 mg premix (20 mg Intravenous New Bag/Given 03/22/18 2200)  meropenem (MERREM) 1 g in sodium chloride 0.9 % 100 mL IVPB (0 g Intravenous Stopped 03/22/18 2144)  bisacodyl (DULCOLAX) suppository 10 mg (has no administration in time range)  citalopram (CELEXA) tablet 20 mg (has no administration in time range)  pravastatin (PRAVACHOL) tablet 40 mg (has no administration in time range)  LORazepam (ATIVAN) injection 0.5 mg (has no administration in time range)  enoxaparin (LOVENOX) injection 40 mg (has no administration in time range)  ipratropium-albuterol (DUONEB) 0.5-2.5 (3) MG/3ML nebulizer solution 3 mL (has no administration in time range)  ondansetron (ZOFRAN) tablet 4 mg (has no administration in time range)    Or  ondansetron (ZOFRAN) injection 4 mg (has no administration in time range)  fluconazole (DIFLUCAN) IVPB 200 mg (has no administration in time range)  iopamidol (ISOVUE-300) 61 % injection 100 mL (100 mLs Intravenous Contrast Given 03/22/18 1815)  sodium chloride 0.9 % bolus 500 mL (0 mLs Intravenous  Stopped 03/22/18 2016)  0.9 %  sodium chloride infusion ( Intravenous New Bag/Given 03/22/18 1947)    Mobility non-ambulatory

## 2018-03-22 NOTE — ED Notes (Signed)
Pt is unable to give a urine sample, he states that he just haven't had to go.

## 2018-03-23 ENCOUNTER — Encounter (HOSPITAL_COMMUNITY): Payer: Self-pay

## 2018-03-23 ENCOUNTER — Other Ambulatory Visit: Payer: Self-pay

## 2018-03-23 DIAGNOSIS — G9341 Metabolic encephalopathy: Secondary | ICD-10-CM

## 2018-03-23 DIAGNOSIS — K224 Dyskinesia of esophagus: Principal | ICD-10-CM

## 2018-03-23 DIAGNOSIS — R112 Nausea with vomiting, unspecified: Secondary | ICD-10-CM

## 2018-03-23 DIAGNOSIS — N390 Urinary tract infection, site not specified: Secondary | ICD-10-CM | POA: Diagnosis present

## 2018-03-23 DIAGNOSIS — F0390 Unspecified dementia without behavioral disturbance: Secondary | ICD-10-CM | POA: Diagnosis present

## 2018-03-23 DIAGNOSIS — T83511A Infection and inflammatory reaction due to indwelling urethral catheter, initial encounter: Secondary | ICD-10-CM

## 2018-03-23 DIAGNOSIS — E860 Dehydration: Secondary | ICD-10-CM

## 2018-03-23 DIAGNOSIS — D72829 Elevated white blood cell count, unspecified: Secondary | ICD-10-CM | POA: Diagnosis present

## 2018-03-23 LAB — BASIC METABOLIC PANEL
ANION GAP: 9 (ref 5–15)
BUN: 11 mg/dL (ref 6–20)
CO2: 26 mmol/L (ref 22–32)
Calcium: 8.3 mg/dL — ABNORMAL LOW (ref 8.9–10.3)
Chloride: 102 mmol/L (ref 101–111)
Creatinine, Ser: 0.71 mg/dL (ref 0.61–1.24)
GFR calc Af Amer: >60 mL/min (ref >60)
Glucose, Bld: 109 mg/dL — ABNORMAL HIGH (ref 65–99)
POTASSIUM: 3.8 mmol/L (ref 3.5–5.1)
SODIUM: 137 mmol/L (ref 135–145)

## 2018-03-23 LAB — CBC
HCT: 36.7 % — ABNORMAL LOW (ref 39.0–52.0)
Hemoglobin: 11.8 g/dL — ABNORMAL LOW (ref 13.0–17.0)
MCH: 30.6 pg (ref 26.0–34.0)
MCHC: 32.2 g/dL (ref 30.0–36.0)
MCV: 95.1 fL (ref 78.0–100.0)
PLATELETS: 184 10*3/uL (ref 150–400)
RBC: 3.86 MIL/uL — AB (ref 4.22–5.81)
RDW: 15.4 % (ref 11.5–15.5)
WBC: 9.3 10*3/uL (ref 4.0–10.5)

## 2018-03-23 LAB — GLUCOSE, CAPILLARY
GLUCOSE-CAPILLARY: 100 mg/dL — AB (ref 65–99)
Glucose-Capillary: 103 mg/dL — ABNORMAL HIGH (ref 65–99)
Glucose-Capillary: 103 mg/dL — ABNORMAL HIGH (ref 65–99)
Glucose-Capillary: 87 mg/dL (ref 65–99)
Glucose-Capillary: 96 mg/dL (ref 65–99)

## 2018-03-23 MED ORDER — SODIUM CHLORIDE 0.9 % IV SOLN
INTRAVENOUS | Status: DC
  Administered 2018-03-23 (×2): via INTRAVENOUS

## 2018-03-23 MED ORDER — FLUCONAZOLE 100MG IVPB
100.0000 mg | INTRAVENOUS | Status: AC
  Administered 2018-03-23 – 2018-03-29 (×6): 100 mg via INTRAVENOUS
  Filled 2018-03-23 (×7): qty 50

## 2018-03-23 MED ORDER — SODIUM CHLORIDE 0.9 % IV BOLUS
500.0000 mL | Freq: Once | INTRAVENOUS | Status: AC
  Administered 2018-03-23: 500 mL via INTRAVENOUS

## 2018-03-23 MED ORDER — ORAL CARE MOUTH RINSE
15.0000 mL | Freq: Two times a day (BID) | OROMUCOSAL | Status: DC
  Administered 2018-03-23 – 2018-03-30 (×12): 15 mL via OROMUCOSAL

## 2018-03-23 NOTE — Progress Notes (Signed)
Dr. Grandville Silos was paged regarding pt's NPO status and morning lab glucose of 109 at 0524. Q-4 CBG checks were requested.

## 2018-03-23 NOTE — Progress Notes (Signed)
PROGRESS NOTE    Warren [name]  UXL:[id number] DOB: [birth year] DOA: 03/22/2018 PCP: [name], MD   Brief Narrative:  Patient is a 84 year old gentleman history of coronary artery disease,'s emphysema, reported remote esophageal stricture, indwelling Foley catheter previously had an artificial sphincter that he recently removed, depression, anxiety presented with nausea and vomiting.  Patient recently hospitalized from April 27 to May 1 after transfer from [facility] with sepsis secondary to urinary tract infection positive for Pseudomonas and infected artificial urinary sphincter.  Foley catheter was placed and patient treated with Merrem.  Patient was discharged to a skilled nursing facility.  Patient presented back with nausea vomiting inability to keep any significant food or fluids down.  Dialysis which was done was worrisome for UTI noted yeast in there.  Urine cultures obtained and pending.  Patient placed on IV Diflucan and IV Merrem.  GI consulted due to nausea and vomiting and history of esophageal stricture/esophageal dysmotility.   Assessment & Plan:   Principal Problem:   Nausea and vomiting Active Problems:   Dyslipidemia   Anxiety and depression   Esophageal motility disorder   Urinary tract infection associated with catheterization of urinary tract, initial encounter (HCC)   Leukocytosis   Dementia   Dehydration   Acute metabolic encephalopathy  #1 nausea and vomiting Concern felt secondary to esophageal dysmotility.  Patient unable to swallow significant food.  Imaging done consistent with esophageal dilatation.  Patient with history of moderate esophageal dysmotility back in 2015.  Concern for stricture versus mass versus food obstruction.  Patient also noted to have a UTI.  Patient currently n.p.o.  Increase IV fluids to normal saline 100 cc/h.  Give a fluid bolus.  IV antiemetics.  Supportive care.  GI consulted for further evaluation and  management.  2.  UTI Patient with urinalysis consistent with a UTI patient.  Patient with recent treatment for urosepsis secondary to Pseudomonas and infected urinary sphincter status post removal with Foley catheter placement.  Urinalysis also noted yeast.  Urine cultures pending.  Continue IV meropenem.  Patient given a dose of IV Diflucan.  We will continue treatment with IV Diflucan x7 days.  Due to recent urinary sphincter removal secondary to infection will consult with urology for further evaluation and management.  3.  Leukocytosis Likely secondary to UTI.  Urine cultures pending.  Blood cultures pending.  Continue empiric IV Merrem.  Follow.  4.  Dementia  5.  Depression/anxiety Currently stable.  Patient sleeping.  Celexa when more alert to take oral intake.  6.  Acute encephalopathy Patient somewhat drowsy however arousable to sternal rub and answering questions appropriately.  Patient also with underlying dementia.  Urinalysis worrisome for UTI.  Patient also noted to be clinically dehydrated.  Blood cultures pending.  Patient with no focal neurological deficits.  Give a bolus of normal saline 500 cc x 1.  Increase IV fluids 100 cc/h.  Continue empiric IV antibiotics.  Follow.  7.  Dehydration Give a bolus of normal saline 500 cc x 1.  Increase IV fluids to normal saline at 100 cc/h.  Follow.   DVT prophylaxis: Lovenox Code Status: Full Family Communication: Updated son at bedside. Disposition Plan: To be determined.   Consultants:   Gastroenterology: Dr. Lyndel Safe 03/23/2018  Procedures:   CT chest 03/22/2018  CT abdomen and pelvis 03/22/2018  Chest x-ray 03/22/2018  Antimicrobials:   IV Merrem 03/22/2018  IV Diflucan 03/22/2018   Subjective: Patient asleep.  Opens eyes to sternal rub  and answers questions appropriately.  Denies any chest pain or shortness of breath.  No further nausea or emesis.  Objective: Vitals:   03/22/18 2304 03/23/18 0003 03/23/18 0617  03/23/18 1350  BP: 129/72 129/72 110/72 120/69  Pulse: 87 87 76 78  Resp:  (!) 22  18  Temp:  98.5 F (36.9 C) 97.8 F (36.6 C) 98.9 F (37.2 C)  TempSrc:  Oral Oral Oral  SpO2: 98% 98%  100%  Weight: 74.9 kg (165 lb 2 oz) 74.9 kg (165 lb 2 oz)    Height: 5\' 7"  (1.702 m) 5\' 7"  (1.702 m)      Intake/Output Summary (Last 24 hours) at 03/23/2018 1652 Last data filed at 03/23/2018 1600 Gross per 24 hour  Intake 2831.45 ml  Output 1000 ml  Net 1831.45 ml   Filed Weights   03/22/18 1531 03/22/18 2304 03/23/18 0003  Weight: 77.1 kg (170 lb) 74.9 kg (165 lb 2 oz) 74.9 kg (165 lb 2 oz)    Examination:  General exam: Sleeping.  Drowsy. Respiratory system: Clear to auscultation. Respiratory effort normal. Cardiovascular system: S1 & S2 heard, RRR. No JVD, murmurs, rubs, gallops or clicks. No pedal edema. Gastrointestinal system: Abdomen is nondistended, soft and nontender. No organomegaly or masses felt. Normal bowel sounds heard. Central nervous system: Alert and oriented. No focal neurological deficits. Extremities: Symmetric 5 x 5 power. Skin: No rashes, lesions or ulcers Psychiatry: Judgement and insight appear normal. Mood & affect appropriate.     Data Reviewed: I have personally reviewed following labs and imaging studies  CBC: Recent Labs  Lab 03/22/18 1606 03/23/18 0524  WBC 15.3* 9.3  NEUTROABS 13.1*  --   HGB 14.5 11.8*  HCT 44.3 36.7*  MCV 94.1 95.1  PLT 216 619   Basic Metabolic Panel: Recent Labs  Lab 03/22/18 1606 03/23/18 0524  NA 136 137  K 3.9 3.8  CL 96* 102  CO2 28 26  GLUCOSE 135* 109*  BUN 13 11  CREATININE 0.86 0.71  CALCIUM 9.6 8.3*   GFR: Estimated Creatinine Clearance: 53.9 mL/min (by C-G formula based on SCr of 0.71 mg/dL). Liver Function Tests: Recent Labs  Lab 03/22/18 1606  AST 15  ALT 11*  ALKPHOS 47  BILITOT 1.3*  PROT 7.3  ALBUMIN 3.8   Recent Labs  Lab 03/22/18 1606  LIPASE 27   No results for input(s): AMMONIA  in the last 168 hours. Coagulation Profile: Recent Labs  Lab 03/22/18 1606  INR 1.07   Cardiac Enzymes: No results for input(s): CKTOTAL, CKMB, CKMBINDEX, TROPONINI in the last 168 hours. BNP (last 3 results) No results for input(s): PROBNP in the last 8760 hours. HbA1C: No results for input(s): HGBA1C in the last 72 hours. CBG: Recent Labs  Lab 03/23/18 0925 03/23/18 1346  GLUCAP 103* 96   Lipid Profile: No results for input(s): CHOL, HDL, LDLCALC, TRIG, CHOLHDL, LDLDIRECT in the last 72 hours. Thyroid Function Tests: No results for input(s): TSH, T4TOTAL, FREET4, T3FREE, THYROIDAB in the last 72 hours. Anemia Panel: No results for input(s): VITAMINB12, FOLATE, FERRITIN, TIBC, IRON, RETICCTPCT in the last 72 hours. Sepsis Labs: Recent Labs  Lab 03/22/18 1611  LATICACIDVEN 1.40    Recent Results (from the past 240 hour(s))  Culture, blood (Routine x 2)     Status: None (Preliminary result)   Collection Time: 03/22/18  4:06 PM  Result Value Ref Range Status   Specimen Description   Final    BLOOD LEFT ANTECUBITAL Performed  at [facility], [place] [address] [place], [place] [address]    Special Requests   Final    BOTTLES DRAWN AEROBIC AND ANAEROBIC Blood Culture results may not be optimal due to an excessive volume of blood received in culture bottles Performed at [facility] [address] [place], [place] [address]    Culture   Final    NO GROWTH < 24 HOURS Performed at [facility] [address] [place], [place] [address]    Report Status PENDING  Incomplete  Culture, blood (Routine x 2)     Status: None (Preliminary result)   Collection Time: 03/22/18  7:01 PM  Result Value Ref Range Status   Specimen Description   Final    BLOOD LEFT HAND Performed at [facility] [address] [place], [place] [address]    Special Requests   Final    BOTTLES DRAWN AEROBIC AND ANAEROBIC Blood Culture  adequate volume Performed at [facility] [address] [place], [place] [address]    Culture   Final    NO GROWTH < 24 HOURS Performed at [facility] [address], [place], [place] [address]    Report Status PENDING  Incomplete         Radiology Studies: Dg Chest 2 View  Result Date: 03/22/2018 CLINICAL DATA:  Difficulty swallowing for the past 24 hours. EXAM: CHEST - 2 VIEW COMPARISON:  03/02/2018 FINDINGS: The cardiac silhouette, mediastinal and hilar contours are stable. There is tortuosity and calcification of the thoracic aorta. Low lung volumes with vascular crowding and streaky bibasilar atelectasis. No definite pleural effusions. The bony thorax is grossly intact. IMPRESSION: Low lung volumes with vascular crowding and basilar atelectasis. No definite infiltrates or effusions. Electronically Signed   By: Marijo Sanes M.D.   On: 03/22/2018 17:08   Ct Chest W Contrast  Result Date: 03/22/2018 CLINICAL DATA:  Difficulty swallowing. Nausea and vomiting. Hypertension. Emphysema. EXAM: CT CHEST, ABDOMEN, AND PELVIS WITH CONTRAST TECHNIQUE: Multidetector CT imaging of the chest, abdomen and pelvis was performed following the standard protocol during bolus administration of intravenous contrast. CONTRAST:  162mL ISOVUE-300 IOPAMIDOL (ISOVUE-300) INJECTION 61% COMPARISON:  Abdominopelvic CT 02/19/2018. Chest radiograph earlier today. FINDINGS: CT CHEST FINDINGS Cardiovascular: Aortic and branch vessel atherosclerosis. 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 Normal heart size, without pericardial effusion. Multivessel coronary artery atherosclerosis. Mediastinum/Nodes: Right-sided thyroid enlargement with a 1.2 cm nodule within, nonspecific. No mediastinal or hilar adenopathy. The esophagus is dilated and fluid-filled, including on image 18/3. No well-defined obstructive mass. Lungs/Pleura: Small left-sided pleural effusion.  Bibasilar volume loss with diaphragmatic elevation and overlying subsegmental atelectasis in both lung bases. Musculoskeletal: No acute osseous abnormality. CT ABDOMEN PELVIS FINDINGS Hepatobiliary: Normal liver. Cholecystectomy, without biliary ductal dilatation. Pancreas: Normal, without mass or ductal dilatation. Spleen: Normal in size, without focal abnormality. Adrenals/Urinary Tract: Mild left adrenal nodularity. Normal right adrenal gland. Normal kidneys, without hydronephrosis. Foley catheter within the urinary bladder. The bladder is not entirely decompressed. Mildly thick walled bladder. Stomach/Bowel: Normal stomach, without wall thickening. Scattered colonic diverticula. Normal terminal ileum. Normal small bowel. Vascular/Lymphatic: Aortic and branch vessel atherosclerosis. No abdominopelvic adenopathy. Reproductive: Normal prostate. Other: No significant free fluid. Decreased anterior pelvic wall edema. No free intraperitoneal air. Musculoskeletal: Osteopenia. Presumably degenerative partial fusion of the bilateral sacroiliac joints. Lumbosacral spondylosis. IMPRESSION: 1. Fluid-filled, dilated esophagus. This could represent esophageal dysmotility or an otherwise occult obstructive lesion.  Consider nonemergent esophagram. 2. No other acute process in the chest abdomen or pelvis. 3. Foley catheter within the urinary bladder. Wall thickening within the bladder may represent cystitis. Improved presumed cellulitis about the ventral pelvic wall. Lack of entire bladder decompression may suggest Foley catheter dysfunction. 4. Coronary artery atherosclerosis. Aortic Atherosclerosis (ICD10-I70.0). 5. Low lung volumes with diaphragmatic elevation bilaterally. Electronically Signed   By: Abigail Miyamoto M.D.   On: 03/22/2018 18:51   Ct Abdomen Pelvis W Contrast  Result Date: 03/22/2018 CLINICAL DATA:  Difficulty swallowing. Nausea and vomiting. Hypertension. Emphysema. EXAM: CT CHEST, ABDOMEN, AND PELVIS WITH  CONTRAST TECHNIQUE: Multidetector CT imaging of the chest, abdomen and pelvis was performed following the standard protocol during bolus administration of intravenous contrast. CONTRAST:  18mL ISOVUE-300 IOPAMIDOL (ISOVUE-300) INJECTION 61% COMPARISON:  Abdominopelvic CT 02/19/2018. Chest radiograph earlier today. FINDINGS: CT CHEST FINDINGS Cardiovascular: Aortic and branch vessel atherosclerosis. 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 Normal heart size, without pericardial effusion. Multivessel coronary artery atherosclerosis. Mediastinum/Nodes: Right-sided thyroid enlargement with a 1.2 cm nodule within, nonspecific. No mediastinal or hilar adenopathy. The esophagus is dilated and fluid-filled, including on image 18/3. No well-defined obstructive mass. Lungs/Pleura: Small left-sided pleural effusion. Bibasilar volume loss with diaphragmatic elevation and overlying subsegmental atelectasis in both lung bases. Musculoskeletal: No acute osseous abnormality. CT ABDOMEN PELVIS FINDINGS Hepatobiliary: Normal liver. Cholecystectomy, without biliary ductal dilatation. Pancreas: Normal, without mass or ductal dilatation. Spleen: Normal in size, without focal abnormality. Adrenals/Urinary Tract: Mild left adrenal nodularity. Normal right adrenal gland. Normal kidneys, without hydronephrosis. Foley catheter within the urinary bladder. The bladder is not entirely decompressed. Mildly thick walled bladder. Stomach/Bowel: Normal stomach, without wall thickening. Scattered colonic diverticula. Normal terminal ileum. Normal small bowel. Vascular/Lymphatic: Aortic and branch vessel atherosclerosis. No abdominopelvic adenopathy. Reproductive: Normal prostate. Other: No significant free fluid. Decreased anterior pelvic wall edema. No free intraperitoneal air. Musculoskeletal: Osteopenia. Presumably degenerative partial fusion of the bilateral sacroiliac joints. Lumbosacral spondylosis. IMPRESSION:  1. Fluid-filled, dilated esophagus. This could represent esophageal dysmotility or an otherwise occult obstructive lesion. Consider nonemergent esophagram. 2. No other acute process in the chest abdomen or pelvis. 3. Foley catheter within the urinary bladder. Wall thickening within the bladder may represent cystitis. Improved presumed cellulitis about the ventral pelvic wall. Lack of entire bladder decompression may suggest Foley catheter dysfunction. 4. Coronary artery atherosclerosis. Aortic Atherosclerosis (ICD10-I70.0). 5. Low lung volumes with diaphragmatic elevation bilaterally. Electronically Signed   By: Abigail Miyamoto M.D.   On: 03/22/2018 18:51        Scheduled Meds: . citalopram  20 mg Oral Daily  . enoxaparin (LOVENOX) injection  40 mg Subcutaneous QHS  . mouth rinse  15 mL Mouth Rinse BID  . pravastatin  40 mg Oral Daily   Continuous Infusions: . sodium chloride 100 mL/hr at 03/23/18 1439  . famotidine (PEPCID) IV Stopped (03/23/18 1110)  . fluconazole (DIFLUCAN) IV Stopped (03/23/18 1028)  . meropenem (MERREM) IV Stopped (03/23/18 1528)     LOS: 1 day    Time spent: 40 minutes    Irine Seal, MD [facility] Pager [phone]  If 7PM-7AM, please contact night-coverage [email or web address] Password [facility] 03/23/2018, 4:52 PM

## 2018-03-23 NOTE — Consult Note (Addendum)
Referring Provider:  Dr. Grandville Silos Primary Care Physician:  [name], MD Primary Gastroenterologist:  Dr. Ardis Hughs  Reason for Consultation:  Dysphagia and abnormal esophagram  HPI: Warren [name] is a 84 y.o. male with medical history significant of CAD, emphysema, reported remote esophageal stricture, indwelling Foley catheter (previously had an artifical sphincter that he recently had removed), anxiety, depression, and dementia who presented with complaints of nausea and vomiting.  History is obtained from the patient's son present at bedside as patient is very lethargic.  He actually had recently been hospitalized from 4/27-5/1; after presenting as a transfer from [facility] with sepsis secondary to urinary tract infection positive for Pseudomonas at outside facility related to an infected artificial urinary sphincter.  Dr. [name] of Urology was consulted with removal of the prosthesis on 4/27, placement of Foley catheter, and treatment with meropenem.  At discharge patient was sent to a skilled nursing facility where he had been getting stronger utilizing a walker to ambulate.  Son reports that he was physically getting back to baseline but mentally had not recovered as much.  Son reported patient having upset stomach 2 days ago after eating at a BBQ. The day of admission he had reportedly been having nausea and vomiting since the morning, unable to keep any significant food or fluids down.  Vomitus was initially of food contents, but patient was noted to be spitting up frothy white secretions as the day went on.  Upon evaluation, CT scan of the chest, abdomen, and pelvis showed the following:  IMPRESSION: 1. Fluid-filled, dilated esophagus. This could represent esophageal dysmotility or an otherwise occult obstructive lesion. Consider nonemergent esophagram. 2. No other acute process in the chest abdomen or pelvis. 3. Foley catheter within the urinary bladder. Wall  thickening within the bladder may represent cystitis. Improved presumed cellulitis about the ventral pelvic wall. Lack of entire bladder decompression may suggest Foley catheter dysfunction. 4. Coronary artery atherosclerosis. Aortic Atherosclerosis (ICD10-I70.0). 5. Low lung volumes with diaphragmatic elevation bilaterally.  He is being treated with meropenem and diflucan for possible yeast in urine as well.  Blood cultures are pending.  He is on IV pepcid BID here but was not on any acid medications at home.  Currently he is sitting in bed but very lethargic (received a dose of IV ativan last night).  Difficult to arouse but did open his eyes briefly and mumbled some words.  Appears comfortable, but further vomiting or spitting out any material.  He saw Dr. Ardis Hughs in our office in 2015 for complaints of dysphagia.  Had an esophagram that showed the following:  IMPRESSION: 1. Moderate esophageal dysmotility, likely presbyesophagus. 2. Delayed passage of a 13 mm barium tablet at the gastroesophageal junction. No anatomic cause identified. Likely related to Dysmotility.  Son reports remote history of esophageal stricture that required dilation on a couple of occasions.    Past Medical History:  Diagnosis Date  . CAD (coronary artery disease)    moderate by catheterization,11/08...essentially unchanged by repeat study,10/11  . Carotid artery disease (HCC)     less than 50% stenosis bilaterally March 2013   . Claudication ([place])     ABIs within ABIs within normal limits, 40% left iliac disease by catheterization   . Colon polyps   . Depression   . Dizzy   . Edema extremities     secondary to severe varicose veins. Nonpitting edema and dermatitis.   . Emphysema   . Glaucoma   . Hyperlipidemia   .  IBS (irritable bowel syndrome)   . Mitral regurgitation    mild  . Mixed dyslipidemia   . Peripheral edema   . Pneumonia   . Prostate cancer [facility])    s/p radiation treatment/s/p TURP   . Shortness of breath    Emphysema  . Thyroid nodule     posterior and inferior to the right lobepossibly outside of the gland secondary to parathyroid adenoma in addition multiple bilateral thyroid nodules     Past Surgical History:  Procedure Laterality Date  . CARDIAC CATHETERIZATION  2008  . CHOLECYSTECTOMY    . CYSTOSCOPY N/A 02/27/2018   Procedure: CYSTOSCOPY;  Surgeon: Bjorn Loser, MD;  Location: [facility];  Service: Urology;  Laterality: N/A;  . INCONTINENCE SURGERY     ring inserted  . TONSILLECTOMY    . TRANSURETHRAL RESECTION OF PROSTATE     has stress urinary incontinence  . URINARY SPHINCTER IMPLANT N/A 02/27/2018   Procedure: REMOVAL INFECTED ARTIFICIAL URINARY SPHINCTER;  Surgeon: Bjorn Loser, MD;  Location: [facility];  Service: Urology;  Laterality: N/A;    Prior to Admission medications   Medication Sig Start Date End Date Taking? Authorizing Provider  alendronate (FOSAMAX) 70 MG tablet Take 70 mg by mouth once a week. Take 70 mg once a week in the morning with a full glass of water on an empty stomach. Do not lay down for 30 minutes 02/17/18  Yes [provider]  bisacodyl (DULCOLAX) 10 MG suppository Place 10 mg rectally daily as needed for moderate constipation.   Yes [provider]  CALCIUM 600 600 MG TABS tablet Take 600 mg by mouth 2 (two) times daily. 02/17/18  Yes [provider]  Cholecalciferol (VITAMIN D3) 10000 units TABS Take 2,000 Units by mouth daily.   Yes [provider]  citalopram (CELEXA) 20 MG tablet Take 20 mg by mouth daily.     Yes [provider]  LORazepam (ATIVAN) 1 MG tablet Take 1 tablet (1 mg total) by mouth at bedtime. 03/03/18  Yes [name], Velta Addison, DO  magnesium hydroxide (MILK OF MAGNESIA) 400 MG/5ML suspension Take 30 mLs by mouth daily as needed for mild constipation.   Yes [provider]  MYRBETRIQ 50 MG TB24 tablet Take 50 mg by mouth daily. 02/17/18  Yes [provider]  pravastatin (PRAVACHOL) 40 MG tablet Take 40 mg by mouth daily.   Yes [provider]  pravastatin (PRAVACHOL) 10 MG tablet Take 4 tablets (40 mg total) by mouth daily. (HOLD X 2 WEEKS) Patient not taking: Reported on 03/22/2018 03/03/18   Cristal Ford, DO  atorvastatin (LIPITOR) 40 MG tablet Take 40 mg by mouth at bedtime.    01/27/12  [provider]  furosemide (LASIX) 20 MG tablet Take 1 tablet (20 mg total) by mouth daily. 07/30/11 01/27/12  de Stanford Scotland, MD    Current Facility-Administered Medications  Medication Dose Route Frequency Provider Last Rate Last Dose  . 0.9 %  sodium chloride infusion   Intravenous Continuous Eugenie Filler, MD      . bisacodyl (DULCOLAX) suppository 10 mg  10 mg Rectal Daily PRN Fuller Plan A, MD      . citalopram (CELEXA) tablet 20 mg  20 mg Oral Daily Smith, Rondell A, MD      . enoxaparin (LOVENOX) injection 40 mg  40 mg Subcutaneous QHS Smith, Rondell A, MD   40 mg at 03/22/18 2335  . famotidine (PEPCID) IVPB 20 mg premix  20  mg Intravenous Q12H Charlesetta Shanks, MD   Stopped at 03/22/18 2230  . fluconazole (DIFLUCAN) IVPB 100 mg  100 mg Intravenous Q24H Eugenie Filler, MD      . ipratropium-albuterol (DUONEB) 0.5-2.5 (3) MG/3ML nebulizer solution 3 mL  3 mL Nebulization Q4H PRN Tamala Julian, Rondell A, MD      . LORazepam (ATIVAN) injection 0.5 mg  0.5 mg Intravenous BID BM & HS PRN Fuller Plan A, MD   0.5 mg at 03/22/18 2235  . MEDLINE mouth rinse  15 mL Mouth Rinse BID Smith, Rondell A, MD      . meropenem (MERREM) 1 g in sodium chloride 0.9 % 100 mL IVPB  1 g Intravenous Q8H Charlesetta Shanks, MD   Stopped at 03/23/18 0534  . ondansetron (ZOFRAN) tablet 4 mg  4 mg Oral Q6H PRN Fuller Plan A, MD       Or  . ondansetron (ZOFRAN) injection 4 mg  4 mg Intravenous Q6H PRN Smith, Rondell A, MD      . pravastatin (PRAVACHOL) tablet 40 mg  40 mg Oral Daily Fuller Plan A, MD        Allergies as of 03/22/2018 - Review  Complete 03/22/2018  Allergen Reaction Noted  . Aspirin    . Latex  02/26/2018  . Penicillins  02/26/2018    Family History  Problem Relation Age of Onset  . Heart attack Sister        in her 37's  . Other Mother        lived to be 72  . Pancreatic cancer Father   . Crohn's disease Son   . Colon cancer Neg Hx     Social History   Socioeconomic History  . Marital status: Widowed    Spouse name: Not on file  . Number of children: 4  . Years of education: Not on file  . Highest education level: Not on file  Occupational History  . Occupation: RETIRED    Comment: Engineer, manufacturing systems at [employer]  . Financial resource strain: Not on file  . Food insecurity:    Worry: Not on file    Inability: Not on file  . Transportation needs:    Medical: Not on file    Non-medical: Not on file  Tobacco Use  . Smoking status: Former Smoker    Packs/day: 1.00    Years: 40.00    Pack years: 40.00    Types: Cigarettes    Last attempt to quit: 11/04/1983    Years since quitting: 34.4  . Smokeless tobacco: Never Used  Substance and Sexual Activity  . Alcohol use: No  . Drug use: No  . Sexual activity: Not on file  Lifestyle  . Physical activity:    Days per week: Not on file    Minutes per session: Not on file  . Stress: Not on file  Relationships  . Social connections:    Talks on phone: Not on file    Gets together: Not on file    Attends religious service: Not on file    Active member of club or organization: Not on file    Attends meetings of clubs or organizations: Not on file    Relationship status: Not on file  . Intimate partner violence:    Fear of current or ex partner: Not on file    Emotionally abused: Not on file    Physically abused: Not on file    Forced sexual activity: Not  on file  Other Topics Concern  . Not on file  Social History Narrative   Has four children   World War II Veteran/ he was in the [employer] and the [employer] during WW[employer]     Review of Systems: ROS is O/W negative except as mentioned in HPI.  Physical Exam: Vital signs in last 24 hours: Temp:  [97.3 F (36.3 C)-98.5 F (36.9 C)] 97.8 F (36.6 C) (05/21 0617) Pulse Rate:  [68-95] 76 (05/21 0617) Resp:  [18-36] 22 (05/21 0003) BP: (110-135)/(72-91) 110/72 (05/21 0617) SpO2:  [93 %-100 %] 98 % (05/21 0003) Weight:  [165 lb 2 oz (74.9 kg)-170 lb (77.1 kg)] 165 lb 2 oz (74.9 kg) (05/21 0003)   General:  Alert, frail, lethargic, in NAD. Head:  Normocephalic and atraumatic. Eyes:  Sclera clear, no icterus.  Conjunctiva pink. Ears:  Normal auditory acuity. Mouth:  No deformity or lesions.   Lungs:  Clear throughout to auscultation.  No wheezes, crackles, or rhonchi.  Heart:  Regular rate and rhythm; no murmurs, clicks, rubs, or gallops. Abdomen:  Soft, non-distended.  BS present.  Non-tender. Msk:  Symmetrical without gross deformities. Pulses:  Normal pulses noted. Extremities:  Without clubbing or edema. Neurologic:  Alert and  oriented x4;  grossly normal neurologically. Skin:  Intact without significant lesions or rashes. Psych:  Alert and cooperative. Normal mood and affect.  Intake/Output from previous day: 05/20 0701 - 05/21 0700 In: 1497.5 [I.V.:747.5; IV Piggyback:750] Out: 800 [Urine:800]  Lab Results: Recent Labs    03/22/18 1606 03/23/18 0524  WBC 15.3* 9.3  HGB 14.5 11.8*  HCT 44.3 36.7*  PLT 216 184   BMET Recent Labs    03/22/18 1606 03/23/18 0524  NA 136 137  K 3.9 3.8  CL 96* 102  CO2 28 26  GLUCOSE 135* 109*  BUN 13 11  CREATININE 0.86 0.71  CALCIUM 9.6 8.3*   LFT Recent Labs    03/22/18 1606  PROT 7.3  ALBUMIN 3.8  AST 15  ALT 11*  ALKPHOS 47  BILITOT 1.3*   PT/INR Recent Labs    03/22/18 1606  LABPROT 13.8  INR 1.07    Studies/Results: Dg Chest 2 View  Result Date: 03/22/2018 CLINICAL DATA:  Difficulty swallowing for the past 24 hours. EXAM: CHEST - 2 VIEW COMPARISON:  03/02/2018 FINDINGS:  The cardiac silhouette, mediastinal and hilar contours are stable. There is tortuosity and calcification of the thoracic aorta. Low lung volumes with vascular crowding and streaky bibasilar atelectasis. No definite pleural effusions. The bony thorax is grossly intact. IMPRESSION: Low lung volumes with vascular crowding and basilar atelectasis. No definite infiltrates or effusions. Electronically Signed   By: Marijo Sanes M.D.   On: 03/22/2018 17:08   Ct Chest W Contrast  Result Date: 03/22/2018 CLINICAL DATA:  Difficulty swallowing. Nausea and vomiting. Hypertension. Emphysema. EXAM: CT CHEST, ABDOMEN, AND PELVIS WITH CONTRAST TECHNIQUE: Multidetector CT imaging of the chest, abdomen and pelvis was performed following the standard protocol during bolus administration of intravenous contrast. CONTRAST:  14mL ISOVUE-300 IOPAMIDOL (ISOVUE-300) INJECTION 61% COMPARISON:  Abdominopelvic CT 02/19/2018. Chest radiograph earlier today. FINDINGS: CT CHEST FINDINGS Cardiovascular: Aortic and branch vessel atherosclerosis. 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 Normal heart size, without pericardial effusion. Multivessel coronary artery atherosclerosis. Mediastinum/Nodes: Right-sided thyroid enlargement with a 1.2 cm nodule within, nonspecific. No mediastinal or hilar adenopathy. The esophagus is dilated and fluid-filled, including on image 18/3. No well-defined obstructive mass. Lungs/Pleura: Small left-sided  pleural effusion. Bibasilar volume loss with diaphragmatic elevation and overlying subsegmental atelectasis in both lung bases. Musculoskeletal: No acute osseous abnormality. CT ABDOMEN PELVIS FINDINGS Hepatobiliary: Normal liver. Cholecystectomy, without biliary ductal dilatation. Pancreas: Normal, without mass or ductal dilatation. Spleen: Normal in size, without focal abnormality. Adrenals/Urinary Tract: Mild left adrenal nodularity. Normal right adrenal gland. Normal kidneys,  without hydronephrosis. Foley catheter within the urinary bladder. The bladder is not entirely decompressed. Mildly thick walled bladder. Stomach/Bowel: Normal stomach, without wall thickening. Scattered colonic diverticula. Normal terminal ileum. Normal small bowel. Vascular/Lymphatic: Aortic and branch vessel atherosclerosis. No abdominopelvic adenopathy. Reproductive: Normal prostate. Other: No significant free fluid. Decreased anterior pelvic wall edema. No free intraperitoneal air. Musculoskeletal: Osteopenia. Presumably degenerative partial fusion of the bilateral sacroiliac joints. Lumbosacral spondylosis. IMPRESSION: 1. Fluid-filled, dilated esophagus. This could represent esophageal dysmotility or an otherwise occult obstructive lesion. Consider nonemergent esophagram. 2. No other acute process in the chest abdomen or pelvis. 3. Foley catheter within the urinary bladder. Wall thickening within the bladder may represent cystitis. Improved presumed cellulitis about the ventral pelvic wall. Lack of entire bladder decompression may suggest Foley catheter dysfunction. 4. Coronary artery atherosclerosis. Aortic Atherosclerosis (ICD10-I70.0). 5. Low lung volumes with diaphragmatic elevation bilaterally. Electronically Signed   By: Abigail Miyamoto M.D.   On: 03/22/2018 18:51   Ct Abdomen Pelvis W Contrast  Result Date: 03/22/2018 CLINICAL DATA:  Difficulty swallowing. Nausea and vomiting. Hypertension. Emphysema. EXAM: CT CHEST, ABDOMEN, AND PELVIS WITH CONTRAST TECHNIQUE: Multidetector CT imaging of the chest, abdomen and pelvis was performed following the standard protocol during bolus administration of intravenous contrast. CONTRAST:  127mL ISOVUE-300 IOPAMIDOL (ISOVUE-300) INJECTION 61% COMPARISON:  Abdominopelvic CT 02/19/2018. Chest radiograph earlier today. FINDINGS: CT CHEST FINDINGS Cardiovascular: Aortic and branch vessel atherosclerosis. Aberrant right subclavian artery traversing posterior to the  esophagus. Normal heart size, without pericardial effusion. Multivessel coronary artery atherosclerosis. Mediastinum/Nodes: Right-sided thyroid enlargement with a 1.2 cm nodule within, nonspecific. No mediastinal or hilar adenopathy. The esophagus is dilated and fluid-filled, including on image 18/3. No well-defined obstructive mass. Lungs/Pleura: Small left-sided pleural effusion. Bibasilar volume loss with diaphragmatic elevation and overlying subsegmental atelectasis in both lung bases. Musculoskeletal: No acute osseous abnormality. CT ABDOMEN PELVIS FINDINGS Hepatobiliary: Normal liver. Cholecystectomy, without biliary ductal dilatation. Pancreas: Normal, without mass or ductal dilatation. Spleen: Normal in size, without focal abnormality. Adrenals/Urinary Tract: Mild left adrenal nodularity. Normal right adrenal gland. Normal kidneys, without hydronephrosis. Foley catheter within the urinary bladder. The bladder is not entirely decompressed. Mildly thick walled bladder. Stomach/Bowel: Normal stomach, without wall thickening. Scattered colonic diverticula. Normal terminal ileum. Normal small bowel. Vascular/Lymphatic: Aortic and branch vessel atherosclerosis. No abdominopelvic adenopathy. Reproductive: Normal prostate. Other: No significant free fluid. Decreased anterior pelvic wall edema. No free intraperitoneal air. Musculoskeletal: Osteopenia. Presumably degenerative partial fusion of the bilateral sacroiliac joints. Lumbosacral spondylosis. IMPRESSION: 1. Fluid-filled, dilated esophagus. This could represent esophageal dysmotility or an otherwise occult obstructive lesion. Consider nonemergent esophagram. 2. No other acute process in the chest abdomen or pelvis. 3. Foley catheter within the urinary bladder. Wall thickening within the bladder may represent cystitis. Improved presumed cellulitis about the ventral pelvic wall. Lack of entire bladder decompression may suggest Foley catheter dysfunction. 4.  Coronary artery atherosclerosis. Aortic Atherosclerosis (ICD10-I70.0). 5. Low lung volumes with diaphragmatic elevation bilaterally. Electronically Signed   By: Abigail Miyamoto M.D.   On: 03/22/2018 18:51   IMPRESSION:  *84 year old male with longstanding history of esophageal dysmotility and remote history of reported esophageal stricture.  Presented with sudden onset nausea and vomiting.  CT chest showed dilated, food filled esophagus.  ? dysmotility vs. stricture vs. mass vs. food obstruction.  Currently not vomiting or bringing up any material. *UTI:  He's also had a lot of urinary tract issues recently as well with removal of artificial sphincter, etc.  Is currently on meropenem and diflucan.  ? If the vomiting was a consequence of UTI and CT findings were incidental.  PLAN: *Will plan for possible EGD on 5/22 pending his mental status.  Dr. Lyndel Safe will discuss further with family. *NPO for now and continue IV pepcid.   Laban Emperor. Zehr  03/23/2018, 9:11 AM   Attending physician's note   I have taken an interval history (not very reliable due to some degree of dementia), reviewed the chart and examined the patient. I agree with the Advanced Practitioner's note, impression and recommendations. CT was reviewed independently.  84 year old with history of esophageal dysmotility, esophageal stricture.  CT chest showing dilated fluid-filled/food filled esophagus.  Plan: Proceed with a EGD with possible esophageal dilatation for tomorrow.  I will discuss above with the son.  Agree with IV Pepcid for now.   Carmell Austria, MD

## 2018-03-23 NOTE — Plan of Care (Signed)
Plan of care explained. Sleepy at this time.

## 2018-03-23 NOTE — Plan of Care (Signed)
Reviewed plan of care, but pt pleasantly confused and unable to comprehend education.

## 2018-03-23 NOTE — H&P (View-Only) (Signed)
Referring Provider:  Dr. Grandville Silos Primary Care Physician:  [name], MD Primary Gastroenterologist:  Dr. Ardis Hughs  Reason for Consultation:  Dysphagia and abnormal esophagram  HPI: Warren [name] is a 84 y.o. male with medical history significant of CAD, emphysema, reported remote esophageal stricture, indwelling Foley catheter (previously had an artifical sphincter that he recently had removed), anxiety, depression, and dementia who presented with complaints of nausea and vomiting.  History is obtained from the patient's son present at bedside as patient is very lethargic.  He actually had recently been hospitalized from 4/27-5/1; after presenting as a transfer from [facility] with sepsis secondary to urinary tract infection positive for Pseudomonas at outside facility related to an infected artificial urinary sphincter.  Dr. [name] of Urology was consulted with removal of the prosthesis on 4/27, placement of Foley catheter, and treatment with meropenem.  At discharge patient was sent to a skilled nursing facility where he had been getting stronger utilizing a walker to ambulate.  Son reports that he was physically getting back to baseline but mentally had not recovered as much.  Son reported patient having upset stomach 2 days ago after eating at a BBQ. The day of admission he had reportedly been having nausea and vomiting since the morning, unable to keep any significant food or fluids down.  Vomitus was initially of food contents, but patient was noted to be spitting up frothy white secretions as the day went on.  Upon evaluation, CT scan of the chest, abdomen, and pelvis showed the following:  IMPRESSION: 1. Fluid-filled, dilated esophagus. This could represent esophageal dysmotility or an otherwise occult obstructive lesion. Consider nonemergent esophagram. 2. No other acute process in the chest abdomen or pelvis. 3. Foley catheter within the urinary bladder. Wall  thickening within the bladder may represent cystitis. Improved presumed cellulitis about the ventral pelvic wall. Lack of entire bladder decompression may suggest Foley catheter dysfunction. 4. Coronary artery atherosclerosis. Aortic Atherosclerosis (ICD10-I70.0). 5. Low lung volumes with diaphragmatic elevation bilaterally.  He is being treated with meropenem and diflucan for possible yeast in urine as well.  Blood cultures are pending.  He is on IV pepcid BID here but was not on any acid medications at home.  Currently he is sitting in bed but very lethargic (received a dose of IV ativan last night).  Difficult to arouse but did open his eyes briefly and mumbled some words.  Appears comfortable, but further vomiting or spitting out any material.  He saw Dr. Ardis Hughs in our office in 2015 for complaints of dysphagia.  Had an esophagram that showed the following:  IMPRESSION: 1. Moderate esophageal dysmotility, likely presbyesophagus. 2. Delayed passage of a 13 mm barium tablet at the gastroesophageal junction. No anatomic cause identified. Likely related to Dysmotility.  Son reports remote history of esophageal stricture that required dilation on a couple of occasions.    Past Medical History:  Diagnosis Date  . CAD (coronary artery disease)    moderate by catheterization,11/08...essentially unchanged by repeat study,10/11  . Carotid artery disease (HCC)     less than 50% stenosis bilaterally March 2013   . Claudication ([place])     ABIs within ABIs within normal limits, 40% left iliac disease by catheterization   . Colon polyps   . Depression   . Dizzy   . Edema extremities     secondary to severe varicose veins. Nonpitting edema and dermatitis.   . Emphysema   . Glaucoma   . Hyperlipidemia   .  IBS (irritable bowel syndrome)   . Mitral regurgitation    mild  . Mixed dyslipidemia   . Peripheral edema   . Pneumonia   . Prostate cancer [facility])    s/p radiation treatment/s/p TURP   . Shortness of breath    Emphysema  . Thyroid nodule     posterior and inferior to the right lobepossibly outside of the gland secondary to parathyroid adenoma in addition multiple bilateral thyroid nodules     Past Surgical History:  Procedure Laterality Date  . CARDIAC CATHETERIZATION  2008  . CHOLECYSTECTOMY    . CYSTOSCOPY N/A 02/27/2018   Procedure: CYSTOSCOPY;  Surgeon: Bjorn Loser, MD;  Location: [facility];  Service: Urology;  Laterality: N/A;  . INCONTINENCE SURGERY     ring inserted  . TONSILLECTOMY    . TRANSURETHRAL RESECTION OF PROSTATE     has stress urinary incontinence  . URINARY SPHINCTER IMPLANT N/A 02/27/2018   Procedure: REMOVAL INFECTED ARTIFICIAL URINARY SPHINCTER;  Surgeon: Bjorn Loser, MD;  Location: [facility];  Service: Urology;  Laterality: N/A;    Prior to Admission medications   Medication Sig Start Date End Date Taking? Authorizing Provider  alendronate (FOSAMAX) 70 MG tablet Take 70 mg by mouth once a week. Take 70 mg once a week in the morning with a full glass of water on an empty stomach. Do not lay down for 30 minutes 02/17/18  Yes [provider]  bisacodyl (DULCOLAX) 10 MG suppository Place 10 mg rectally daily as needed for moderate constipation.   Yes [provider]  CALCIUM 600 600 MG TABS tablet Take 600 mg by mouth 2 (two) times daily. 02/17/18  Yes [provider]  Cholecalciferol (VITAMIN D3) 10000 units TABS Take 2,000 Units by mouth daily.   Yes [provider]  citalopram (CELEXA) 20 MG tablet Take 20 mg by mouth daily.     Yes [provider]  LORazepam (ATIVAN) 1 MG tablet Take 1 tablet (1 mg total) by mouth at bedtime. 03/03/18  Yes [name], Velta Addison, DO  magnesium hydroxide (MILK OF MAGNESIA) 400 MG/5ML suspension Take 30 mLs by mouth daily as needed for mild constipation.   Yes [provider]  MYRBETRIQ 50 MG TB24 tablet Take 50 mg by mouth daily. 02/17/18  Yes [provider]  pravastatin (PRAVACHOL) 40 MG tablet Take 40 mg by mouth daily.   Yes [provider]  pravastatin (PRAVACHOL) 10 MG tablet Take 4 tablets (40 mg total) by mouth daily. (HOLD X 2 WEEKS) Patient not taking: Reported on 03/22/2018 03/03/18   Cristal Ford, DO  atorvastatin (LIPITOR) 40 MG tablet Take 40 mg by mouth at bedtime.    01/27/12  [provider]  furosemide (LASIX) 20 MG tablet Take 1 tablet (20 mg total) by mouth daily. 07/30/11 01/27/12  de Stanford Scotland, MD    Current Facility-Administered Medications  Medication Dose Route Frequency Provider Last Rate Last Dose  . 0.9 %  sodium chloride infusion   Intravenous Continuous Eugenie Filler, MD      . bisacodyl (DULCOLAX) suppository 10 mg  10 mg Rectal Daily PRN Fuller Plan A, MD      . citalopram (CELEXA) tablet 20 mg  20 mg Oral Daily Smith, Rondell A, MD      . enoxaparin (LOVENOX) injection 40 mg  40 mg Subcutaneous QHS Smith, Rondell A, MD   40 mg at 03/22/18 2335  . famotidine (PEPCID) IVPB 20 mg premix  20  mg Intravenous Q12H Charlesetta Shanks, MD   Stopped at 03/22/18 2230  . fluconazole (DIFLUCAN) IVPB 100 mg  100 mg Intravenous Q24H Eugenie Filler, MD      . ipratropium-albuterol (DUONEB) 0.5-2.5 (3) MG/3ML nebulizer solution 3 mL  3 mL Nebulization Q4H PRN Tamala Julian, Rondell A, MD      . LORazepam (ATIVAN) injection 0.5 mg  0.5 mg Intravenous BID BM & HS PRN Fuller Plan A, MD   0.5 mg at 03/22/18 2235  . MEDLINE mouth rinse  15 mL Mouth Rinse BID Smith, Rondell A, MD      . meropenem (MERREM) 1 g in sodium chloride 0.9 % 100 mL IVPB  1 g Intravenous Q8H Charlesetta Shanks, MD   Stopped at 03/23/18 0534  . ondansetron (ZOFRAN) tablet 4 mg  4 mg Oral Q6H PRN Fuller Plan A, MD       Or  . ondansetron (ZOFRAN) injection 4 mg  4 mg Intravenous Q6H PRN Smith, Rondell A, MD      . pravastatin (PRAVACHOL) tablet 40 mg  40 mg Oral Daily Fuller Plan A, MD        Allergies as of 03/22/2018 - Review  Complete 03/22/2018  Allergen Reaction Noted  . Aspirin    . Latex  02/26/2018  . Penicillins  02/26/2018    Family History  Problem Relation Age of Onset  . Heart attack Sister        in her 81's  . Other Mother        lived to be 49  . Pancreatic cancer Father   . Crohn's disease Son   . Colon cancer Neg Hx     Social History   Socioeconomic History  . Marital status: Widowed    Spouse name: Not on file  . Number of children: 4  . Years of education: Not on file  . Highest education level: Not on file  Occupational History  . Occupation: RETIRED    Comment: Engineer, manufacturing systems at [employer]  . Financial resource strain: Not on file  . Food insecurity:    Worry: Not on file    Inability: Not on file  . Transportation needs:    Medical: Not on file    Non-medical: Not on file  Tobacco Use  . Smoking status: Former Smoker    Packs/day: 1.00    Years: 40.00    Pack years: 40.00    Types: Cigarettes    Last attempt to quit: 11/04/1983    Years since quitting: 34.4  . Smokeless tobacco: Never Used  Substance and Sexual Activity  . Alcohol use: No  . Drug use: No  . Sexual activity: Not on file  Lifestyle  . Physical activity:    Days per week: Not on file    Minutes per session: Not on file  . Stress: Not on file  Relationships  . Social connections:    Talks on phone: Not on file    Gets together: Not on file    Attends religious service: Not on file    Active member of club or organization: Not on file    Attends meetings of clubs or organizations: Not on file    Relationship status: Not on file  . Intimate partner violence:    Fear of current or ex partner: Not on file    Emotionally abused: Not on file    Physically abused: Not on file    Forced sexual activity: Not  on file  Other Topics Concern  . Not on file  Social History Narrative   Has four children   World War II Veteran/ he was in the [employer] and the [employer] during WW[employer]     Review of Systems: ROS is O/W negative except as mentioned in HPI.  Physical Exam: Vital signs in last 24 hours: Temp:  [97.3 F (36.3 C)-98.5 F (36.9 C)] 97.8 F (36.6 C) (05/21 0617) Pulse Rate:  [68-95] 76 (05/21 0617) Resp:  [18-36] 22 (05/21 0003) BP: (110-135)/(72-91) 110/72 (05/21 0617) SpO2:  [93 %-100 %] 98 % (05/21 0003) Weight:  [165 lb 2 oz (74.9 kg)-170 lb (77.1 kg)] 165 lb 2 oz (74.9 kg) (05/21 0003)   General:  Alert, frail, lethargic, in NAD. Head:  Normocephalic and atraumatic. Eyes:  Sclera clear, no icterus.  Conjunctiva pink. Ears:  Normal auditory acuity. Mouth:  No deformity or lesions.   Lungs:  Clear throughout to auscultation.  No wheezes, crackles, or rhonchi.  Heart:  Regular rate and rhythm; no murmurs, clicks, rubs, or gallops. Abdomen:  Soft, non-distended.  BS present.  Non-tender. Msk:  Symmetrical without gross deformities. Pulses:  Normal pulses noted. Extremities:  Without clubbing or edema. Neurologic:  Alert and  oriented x4;  grossly normal neurologically. Skin:  Intact without significant lesions or rashes. Psych:  Alert and cooperative. Normal mood and affect.  Intake/Output from previous day: 05/20 0701 - 05/21 0700 In: 1497.5 [I.V.:747.5; IV Piggyback:750] Out: 800 [Urine:800]  Lab Results: Recent Labs    03/22/18 1606 03/23/18 0524  WBC 15.3* 9.3  HGB 14.5 11.8*  HCT 44.3 36.7*  PLT 216 184   BMET Recent Labs    03/22/18 1606 03/23/18 0524  NA 136 137  K 3.9 3.8  CL 96* 102  CO2 28 26  GLUCOSE 135* 109*  BUN 13 11  CREATININE 0.86 0.71  CALCIUM 9.6 8.3*   LFT Recent Labs    03/22/18 1606  PROT 7.3  ALBUMIN 3.8  AST 15  ALT 11*  ALKPHOS 47  BILITOT 1.3*   PT/INR Recent Labs    03/22/18 1606  LABPROT 13.8  INR 1.07    Studies/Results: Dg Chest 2 View  Result Date: 03/22/2018 CLINICAL DATA:  Difficulty swallowing for the past 24 hours. EXAM: CHEST - 2 VIEW COMPARISON:  03/02/2018 FINDINGS:  The cardiac silhouette, mediastinal and hilar contours are stable. There is tortuosity and calcification of the thoracic aorta. Low lung volumes with vascular crowding and streaky bibasilar atelectasis. No definite pleural effusions. The bony thorax is grossly intact. IMPRESSION: Low lung volumes with vascular crowding and basilar atelectasis. No definite infiltrates or effusions. Electronically Signed   By: Marijo Sanes M.D.   On: 03/22/2018 17:08   Ct Chest W Contrast  Result Date: 03/22/2018 CLINICAL DATA:  Difficulty swallowing. Nausea and vomiting. Hypertension. Emphysema. EXAM: CT CHEST, ABDOMEN, AND PELVIS WITH CONTRAST TECHNIQUE: Multidetector CT imaging of the chest, abdomen and pelvis was performed following the standard protocol during bolus administration of intravenous contrast. CONTRAST:  139mL ISOVUE-300 IOPAMIDOL (ISOVUE-300) INJECTION 61% COMPARISON:  Abdominopelvic CT 02/19/2018. Chest radiograph earlier today. FINDINGS: CT CHEST FINDINGS Cardiovascular: Aortic and branch vessel atherosclerosis. 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 Normal heart size, without pericardial effusion. Multivessel coronary artery atherosclerosis. Mediastinum/Nodes: Right-sided thyroid enlargement with a 1.2 cm nodule within, nonspecific. No mediastinal or hilar adenopathy. The esophagus is dilated and fluid-filled, including on image 18/3. No well-defined obstructive mass. Lungs/Pleura: Small left-sided  pleural effusion. Bibasilar volume loss with diaphragmatic elevation and overlying subsegmental atelectasis in both lung bases. Musculoskeletal: No acute osseous abnormality. CT ABDOMEN PELVIS FINDINGS Hepatobiliary: Normal liver. Cholecystectomy, without biliary ductal dilatation. Pancreas: Normal, without mass or ductal dilatation. Spleen: Normal in size, without focal abnormality. Adrenals/Urinary Tract: Mild left adrenal nodularity. Normal right adrenal gland. Normal kidneys,  without hydronephrosis. Foley catheter within the urinary bladder. The bladder is not entirely decompressed. Mildly thick walled bladder. Stomach/Bowel: Normal stomach, without wall thickening. Scattered colonic diverticula. Normal terminal ileum. Normal small bowel. Vascular/Lymphatic: Aortic and branch vessel atherosclerosis. No abdominopelvic adenopathy. Reproductive: Normal prostate. Other: No significant free fluid. Decreased anterior pelvic wall edema. No free intraperitoneal air. Musculoskeletal: Osteopenia. Presumably degenerative partial fusion of the bilateral sacroiliac joints. Lumbosacral spondylosis. IMPRESSION: 1. Fluid-filled, dilated esophagus. This could represent esophageal dysmotility or an otherwise occult obstructive lesion. Consider nonemergent esophagram. 2. No other acute process in the chest abdomen or pelvis. 3. Foley catheter within the urinary bladder. Wall thickening within the bladder may represent cystitis. Improved presumed cellulitis about the ventral pelvic wall. Lack of entire bladder decompression may suggest Foley catheter dysfunction. 4. Coronary artery atherosclerosis. Aortic Atherosclerosis (ICD10-I70.0). 5. Low lung volumes with diaphragmatic elevation bilaterally. Electronically Signed   By: Abigail Miyamoto M.D.   On: 03/22/2018 18:51   Ct Abdomen Pelvis W Contrast  Result Date: 03/22/2018 CLINICAL DATA:  Difficulty swallowing. Nausea and vomiting. Hypertension. Emphysema. EXAM: CT CHEST, ABDOMEN, AND PELVIS WITH CONTRAST TECHNIQUE: Multidetector CT imaging of the chest, abdomen and pelvis was performed following the standard protocol during bolus administration of intravenous contrast. CONTRAST:  111mL ISOVUE-300 IOPAMIDOL (ISOVUE-300) INJECTION 61% COMPARISON:  Abdominopelvic CT 02/19/2018. Chest radiograph earlier today. FINDINGS: CT CHEST FINDINGS Cardiovascular: Aortic and branch vessel atherosclerosis. Aberrant right subclavian artery traversing posterior to the  esophagus. Normal heart size, without pericardial effusion. Multivessel coronary artery atherosclerosis. Mediastinum/Nodes: Right-sided thyroid enlargement with a 1.2 cm nodule within, nonspecific. No mediastinal or hilar adenopathy. The esophagus is dilated and fluid-filled, including on image 18/3. No well-defined obstructive mass. Lungs/Pleura: Small left-sided pleural effusion. Bibasilar volume loss with diaphragmatic elevation and overlying subsegmental atelectasis in both lung bases. Musculoskeletal: No acute osseous abnormality. CT ABDOMEN PELVIS FINDINGS Hepatobiliary: Normal liver. Cholecystectomy, without biliary ductal dilatation. Pancreas: Normal, without mass or ductal dilatation. Spleen: Normal in size, without focal abnormality. Adrenals/Urinary Tract: Mild left adrenal nodularity. Normal right adrenal gland. Normal kidneys, without hydronephrosis. Foley catheter within the urinary bladder. The bladder is not entirely decompressed. Mildly thick walled bladder. Stomach/Bowel: Normal stomach, without wall thickening. Scattered colonic diverticula. Normal terminal ileum. Normal small bowel. Vascular/Lymphatic: Aortic and branch vessel atherosclerosis. No abdominopelvic adenopathy. Reproductive: Normal prostate. Other: No significant free fluid. Decreased anterior pelvic wall edema. No free intraperitoneal air. Musculoskeletal: Osteopenia. Presumably degenerative partial fusion of the bilateral sacroiliac joints. Lumbosacral spondylosis. IMPRESSION: 1. Fluid-filled, dilated esophagus. This could represent esophageal dysmotility or an otherwise occult obstructive lesion. Consider nonemergent esophagram. 2. No other acute process in the chest abdomen or pelvis. 3. Foley catheter within the urinary bladder. Wall thickening within the bladder may represent cystitis. Improved presumed cellulitis about the ventral pelvic wall. Lack of entire bladder decompression may suggest Foley catheter dysfunction. 4.  Coronary artery atherosclerosis. Aortic Atherosclerosis (ICD10-I70.0). 5. Low lung volumes with diaphragmatic elevation bilaterally. Electronically Signed   By: Abigail Miyamoto M.D.   On: 03/22/2018 18:51   IMPRESSION:  *84 year old male with longstanding history of esophageal dysmotility and remote history of reported esophageal stricture.  Presented with sudden onset nausea and vomiting.  CT chest showed dilated, food filled esophagus.  ? dysmotility vs. stricture vs. mass vs. food obstruction.  Currently not vomiting or bringing up any material. *UTI:  He's also had a lot of urinary tract issues recently as well with removal of artificial sphincter, etc.  Is currently on meropenem and diflucan.  ? If the vomiting was a consequence of UTI and CT findings were incidental.  PLAN: *Will plan for possible EGD on 5/22 pending his mental status.  Dr. Lyndel Safe will discuss further with family. *NPO for now and continue IV pepcid.   Laban Emperor. Zehr  03/23/2018, 9:11 AM   Attending physician's note   I have taken an interval history (not very reliable due to some degree of dementia), reviewed the chart and examined the patient. I agree with the Advanced Practitioner's note, impression and recommendations. CT was reviewed independently.  84 year old with history of esophageal dysmotility, esophageal stricture.  CT chest showing dilated fluid-filled/food filled esophagus.  Plan: Proceed with a EGD with possible esophageal dilatation for tomorrow.  I will discuss above with the son.  Agree with IV Pepcid for now.   Carmell Austria, MD

## 2018-03-23 NOTE — Clinical Social Work Note (Addendum)
5/21 A complete assessment was completed on 4/29 (See note below)   Patient has readmitted to hospital after discharging to [facility] about three weeks ago.   Patient admitted for nausea and vomiting. Patient son wants the patient to return to [facility] at discharge. He reports did not do a bed hold therefore the patient bed is not guaranteed at discharge. He reports the family paid for a month at [facility] under the assumption  the patient was progressing enough to transfer w/ caregivers.  He is uncertain if the patient will transfer there now and may need a continuation for skill care.    Patient son reports the patient receives $2800 a month in social security. VA pension, and a additional pension. He reports the patient cannot pay privately for LTC at [facility].  He understands the patient does not qualify for medicaid due to the 2200 max income.   Patient son reports he will attempt to go to the department of social services to inquire more about additional options.   CSW will continue to follow for discharge needs.     Clinical Social Work Assessment  Patient Details  Name: Warren [name] MRN: [id number] Date of Birth: Oct 09, 1922  Date of referral:                  Reason for consult:                   Permission sought to share information with:    Permission granted to share information::     Name::        Agency::     Relationship::   Son  Contact Information:     Housing/Transportation Living arrangements for the past 2 months:    Source of Information:    Patient Interpreter Needed:    Criminal Activity/Legal Involvement Pertinent to Current Situation/Hospitalization:    Significant Relationships:    Lives with:    Do you feel safe going back to the place where you live?    Need for family participation in patient care:     Care giving concerns:  Pt admitted from home where he resides in independent living apartment in [facility] in [place]. Pt uses walker at baseline- showers, eats, completes ADLs independently. Admitted for removal of infected artificil urinary sphincter. 2 days post op   Facilities manager / plan:  CSW consulted to assist with potential SNF placement. Met with pt and his son at bedside- they stated due to noticing pt weaker than at baseline and also having surgical wounds they are questioning if pt needs to go to SNF for ST rehab.  CSW explained SNF placement process- PT pending (per chart PT attempted today and family requested eval later). CSW explained that PT eval will be needed for SNF referrals to see what level of care pt currently needing when considering mobility/therapy needs. Expressed understanding. Submitted PASRR- went to manual review. Will send additional information to PASRR when requested- PT will need approved PASRR prior to admitting to a SNF. Pt's family preference is for [facility] if available- made referrals there and to other area SNFs.  Plan: SNF for ST rehab Barriers: medical stability for DC, pt participating in PT eval, PASRR, bed offers.   Employment status:   retired Forensic scientist:   Medicare PT Recommendations:   pending Information / Referral to community resources:  SNF Patient/Family's Response to care:  appreciative  Patient/Family's Understanding  of and Emotional Response to Diagnosis, Current Treatment, and Prognosis:  Both pt and son demonstrate good understanding of treatment thus far and potential plan. States, "the goal would be to get some help with therapy and the wound care so he can go back to his apartment"  Emotional Assessment Appearance:    Attitude/Demeanor/Rapport:    Affect (typically observed):    Orientation:    Alcohol / Substance use:    Psych involvement (Current and /or in the community):     Discharge Needs  Concerns to be addressed:    Readmission within the last 30 days:    Current discharge risk:     Barriers to Discharge:      Lia Hopping, [place] 03/23/2018, 11:31 AM  [phone] coverage for [phone]

## 2018-03-23 NOTE — Progress Notes (Signed)
Subjective: CC: Possible catheter obstruction.  Hx:  I was asked to see Mr. Cleland regarding possible foley obstruction.  The patient had removal of an infected, eroded artificial urinary sphincter on 02/27/18 by Dr. [name] and has a foley in place.  He was readmitted on 5/20 for nausea and vomiting and a CT demonstrated the foley in good position but with slightly incomplete bladder emptying.   There was no hydro.  The CT did show a fluid filled esophagus suggesting dysmotility or obstruction.   His urine looked infected and there was yeast in the UA as well, but this isn't surprising for a UA from a foley that has been present for a month.  He was afebrile but has an elevated WBC.  He denies any flank or SP pain.   ROS:  Review of Systems  Constitutional: Negative for fever.  Gastrointestinal: Positive for nausea and vomiting.  Genitourinary: Negative for flank pain.    Anti-infectives: Anti-infectives (From admission, onward)   Start     Dose/Rate Route Frequency Ordered Stop   03/23/18 1000  fluconazole (DIFLUCAN) IVPB 100 mg     100 mg 50 mL/hr over 60 Minutes Intravenous Every 24 hours 03/23/18 0758 03/30/18 0959   03/22/18 2145  fluconazole (DIFLUCAN) IVPB 200 mg     200 mg 100 mL/hr over 60 Minutes Intravenous  Once 03/22/18 2142 03/23/18 0035   03/22/18 2100  meropenem (MERREM) 1 g in sodium chloride 0.9 % 100 mL IVPB     1 g 200 mL/hr over 30 Minutes Intravenous Every 8 hours 03/22/18 2019        Current Facility-Administered Medications  Medication Dose Route Frequency Provider Last Rate Last Dose  . 0.9 %  sodium chloride infusion   Intravenous Continuous Eugenie Filler, MD 100 mL/hr at 03/23/18 1439    . bisacodyl (DULCOLAX) suppository 10 mg  10 mg Rectal Daily PRN Fuller Plan A, MD      . citalopram (CELEXA) tablet 20 mg  20 mg Oral Daily Smith, Rondell A, MD      . enoxaparin (LOVENOX) injection 40 mg  40 mg Subcutaneous QHS Smith, Rondell A, MD   40  mg at 03/22/18 2335  . famotidine (PEPCID) IVPB 20 mg premix  20 mg Intravenous Q12H Charlesetta Shanks, MD   Stopped at 03/23/18 1110  . fluconazole (DIFLUCAN) IVPB 100 mg  100 mg Intravenous Q24H Eugenie Filler, MD   Stopped at 03/23/18 1028  . ipratropium-albuterol (DUONEB) 0.5-2.5 (3) MG/3ML nebulizer solution 3 mL  3 mL Nebulization Q4H PRN Smith, Rondell A, MD      . LORazepam (ATIVAN) injection 0.5 mg  0.5 mg Intravenous BID BM & HS PRN Fuller Plan A, MD   0.5 mg at 03/22/18 2235  . MEDLINE mouth rinse  15 mL Mouth Rinse BID Fuller Plan A, MD   15 mL at 03/23/18 0937  . meropenem (MERREM) 1 g in sodium chloride 0.9 % 100 mL IVPB  1 g Intravenous Q8H Charlesetta Shanks, MD   Stopped at 03/23/18 1528  . ondansetron (ZOFRAN) tablet 4 mg  4 mg Oral Q6H PRN Fuller Plan A, MD       Or  . ondansetron (ZOFRAN) injection 4 mg  4 mg Intravenous Q6H PRN Smith, Rondell A, MD      . pravastatin (PRAVACHOL) tablet 40 mg  40 mg Oral Daily Norval Morton, MD       Past medical, surgical and social history reviewed.  Objective: Vital signs in last 24 hours: Temp:  [97.8 F (36.6 C)-98.9 F (37.2 C)] 98.9 F (37.2 C) (05/21 1350) Pulse Rate:  [76-95] 78 (05/21 1350) Resp:  [18-36] 18 (05/21 1350) BP: (110-129)/(69-91) 120/69 (05/21 1350) SpO2:  [98 %-100 %] 100 % (05/21 1350) Weight:  [74.9 kg (165 lb 2 oz)] 74.9 kg (165 lb 2 oz) (05/21 0003)  Intake/Output from previous day: 05/20 0701 - 05/21 0700 In: 1497.5 [I.V.:747.5; IV Piggyback:750] Out: 800 [Urine:800] Intake/Output this shift: Total I/O In: 1334 [I.V.:633.8; IV Piggyback:700.2] Out: 200 [Urine:200]   Physical Exam  Constitutional: He appears well-developed and well-nourished. No distress.  Abdominal: Soft. There is no tenderness.  Genitourinary:  Genitourinary Comments: There is no evidence of infection in the genitalia and the foley is in place draining fairly clear urine.   There is some separation of the perineal  wound edges but the granulation bed is clean.      Lab Results:  Recent Labs    03/22/18 1606 03/23/18 0524  WBC 15.3* 9.3  HGB 14.5 11.8*  HCT 44.3 36.7*  PLT 216 184   BMET Recent Labs    03/22/18 1606 03/23/18 0524  NA 136 137  K 3.9 3.8  CL 96* 102  CO2 28 26  GLUCOSE 135* 109*  BUN 13 11  CREATININE 0.86 0.71  CALCIUM 9.6 8.3*   PT/INR Recent Labs    03/22/18 1606  LABPROT 13.8  INR 1.07   ABG No results for input(s): PHART, HCO3 in the last 72 hours.  Invalid input(s): PCO2, PO2  Studies/Results: Dg Chest 2 View  Result Date: 03/22/2018 CLINICAL DATA:  Difficulty swallowing for the past 24 hours. EXAM: CHEST - 2 VIEW COMPARISON:  03/02/2018 FINDINGS: The cardiac silhouette, mediastinal and hilar contours are stable. There is tortuosity and calcification of the thoracic aorta. Low lung volumes with vascular crowding and streaky bibasilar atelectasis. No definite pleural effusions. The bony thorax is grossly intact. IMPRESSION: Low lung volumes with vascular crowding and basilar atelectasis. No definite infiltrates or effusions. Electronically Signed   By: Marijo Sanes M.D.   On: 03/22/2018 17:08   Ct Chest W Contrast  Result Date: 03/22/2018 CLINICAL DATA:  Difficulty swallowing. Nausea and vomiting. Hypertension. Emphysema. EXAM: CT CHEST, ABDOMEN, AND PELVIS WITH CONTRAST TECHNIQUE: Multidetector CT imaging of the chest, abdomen and pelvis was performed following the standard protocol during bolus administration of intravenous contrast. CONTRAST:  11mL ISOVUE-300 IOPAMIDOL (ISOVUE-300) INJECTION 61% COMPARISON:  Abdominopelvic CT 02/19/2018. Chest radiograph earlier today. FINDINGS: CT CHEST FINDINGS Cardiovascular: Aortic and branch vessel atherosclerosis. 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 Normal heart size, without pericardial effusion. Multivessel coronary artery atherosclerosis. Mediastinum/Nodes: Right-sided thyroid  enlargement with a 1.2 cm nodule within, nonspecific. No mediastinal or hilar adenopathy. The esophagus is dilated and fluid-filled, including on image 18/3. No well-defined obstructive mass. Lungs/Pleura: Small left-sided pleural effusion. Bibasilar volume loss with diaphragmatic elevation and overlying subsegmental atelectasis in both lung bases. Musculoskeletal: No acute osseous abnormality. CT ABDOMEN PELVIS FINDINGS Hepatobiliary: Normal liver. Cholecystectomy, without biliary ductal dilatation. Pancreas: Normal, without mass or ductal dilatation. Spleen: Normal in size, without focal abnormality. Adrenals/Urinary Tract: Mild left adrenal nodularity. Normal right adrenal gland. Normal kidneys, without hydronephrosis. Foley catheter within the urinary bladder. The bladder is not entirely decompressed. Mildly thick walled bladder. Stomach/Bowel: Normal stomach, without wall thickening. Scattered colonic diverticula. Normal terminal ileum. Normal small bowel. Vascular/Lymphatic: Aortic and branch vessel atherosclerosis. No abdominopelvic adenopathy. Reproductive: Normal prostate.  Other: No significant free fluid. Decreased anterior pelvic wall edema. No free intraperitoneal air. Musculoskeletal: Osteopenia. Presumably degenerative partial fusion of the bilateral sacroiliac joints. Lumbosacral spondylosis. IMPRESSION: 1. Fluid-filled, dilated esophagus. This could represent esophageal dysmotility or an otherwise occult obstructive lesion. Consider nonemergent esophagram. 2. No other acute process in the chest abdomen or pelvis. 3. Foley catheter within the urinary bladder. Wall thickening within the bladder may represent cystitis. Improved presumed cellulitis about the ventral pelvic wall. Lack of entire bladder decompression may suggest Foley catheter dysfunction. 4. Coronary artery atherosclerosis. Aortic Atherosclerosis (ICD10-I70.0). 5. Low lung volumes with diaphragmatic elevation bilaterally. Electronically  Signed   By: Abigail Miyamoto M.D.   On: 03/22/2018 18:51   Ct Abdomen Pelvis W Contrast  Result Date: 03/22/2018 CLINICAL DATA:  Difficulty swallowing. Nausea and vomiting. Hypertension. Emphysema. EXAM: CT CHEST, ABDOMEN, AND PELVIS WITH CONTRAST TECHNIQUE: Multidetector CT imaging of the chest, abdomen and pelvis was performed following the standard protocol during bolus administration of intravenous contrast. CONTRAST:  185mL ISOVUE-300 IOPAMIDOL (ISOVUE-300) INJECTION 61% COMPARISON:  Abdominopelvic CT 02/19/2018. Chest radiograph earlier today. FINDINGS: CT CHEST FINDINGS Cardiovascular: Aortic and branch vessel atherosclerosis. 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 Normal heart size, without pericardial effusion. Multivessel coronary artery atherosclerosis. Mediastinum/Nodes: Right-sided thyroid enlargement with a 1.2 cm nodule within, nonspecific. No mediastinal or hilar adenopathy. The esophagus is dilated and fluid-filled, including on image 18/3. No well-defined obstructive mass. Lungs/Pleura: Small left-sided pleural effusion. Bibasilar volume loss with diaphragmatic elevation and overlying subsegmental atelectasis in both lung bases. Musculoskeletal: No acute osseous abnormality. CT ABDOMEN PELVIS FINDINGS Hepatobiliary: Normal liver. Cholecystectomy, without biliary ductal dilatation. Pancreas: Normal, without mass or ductal dilatation. Spleen: Normal in size, without focal abnormality. Adrenals/Urinary Tract: Mild left adrenal nodularity. Normal right adrenal gland. Normal kidneys, without hydronephrosis. Foley catheter within the urinary bladder. The bladder is not entirely decompressed. Mildly thick walled bladder. Stomach/Bowel: Normal stomach, without wall thickening. Scattered colonic diverticula. Normal terminal ileum. Normal small bowel. Vascular/Lymphatic: Aortic and branch vessel atherosclerosis. No abdominopelvic adenopathy. Reproductive: Normal prostate.  Other: No significant free fluid. Decreased anterior pelvic wall edema. No free intraperitoneal air. Musculoskeletal: Osteopenia. Presumably degenerative partial fusion of the bilateral sacroiliac joints. Lumbosacral spondylosis. IMPRESSION: 1. Fluid-filled, dilated esophagus. This could represent esophageal dysmotility or an otherwise occult obstructive lesion. Consider nonemergent esophagram. 2. No other acute process in the chest abdomen or pelvis. 3. Foley catheter within the urinary bladder. Wall thickening within the bladder may represent cystitis. Improved presumed cellulitis about the ventral pelvic wall. Lack of entire bladder decompression may suggest Foley catheter dysfunction. 4. Coronary artery atherosclerosis. Aortic Atherosclerosis (ICD10-I70.0). 5. Low lung volumes with diaphragmatic elevation bilaterally. Electronically Signed   By: Abigail Miyamoto M.D.   On: 03/22/2018 18:51   Labs and CT films and report reviewed.   Case discussed with Dr. Grandville Silos.   Assessment and Plan: His foley appears to be in good position and is draining.  The bladder is fairly well decompressed on CT and he has no proximal dilation.  The foley could be irrigated prn but not removed or exchanged with the recent AUS removal.    He has some perineal wound separation but the granulation bed appears clean.  I will order wet to dry dressings daily.    CC: Dr. Irine Seal and Dr. Bjorn Loser.    LOS: 1 day    Irine Seal 03/23/2018 [id number] ID: [name], male   DOB: [birth year], 84 y.o.   MRN:  [id number]  

## 2018-03-24 ENCOUNTER — Encounter (HOSPITAL_COMMUNITY)
Admission: EM | Disposition: A | Discharge status: Skilled Nursing Facility | Payer: Self-pay | Source: Home / Self Care | Attending: Family Medicine

## 2018-03-24 ENCOUNTER — Inpatient Hospital Stay (HOSPITAL_COMMUNITY): Payer: Medicare Other | Admitting: Certified Registered Nurse Anesthetist

## 2018-03-24 ENCOUNTER — Encounter (HOSPITAL_COMMUNITY): Payer: Self-pay | Admitting: Certified Registered Nurse Anesthetist

## 2018-03-24 ENCOUNTER — Encounter: Payer: Self-pay | Admitting: Gastroenterology

## 2018-03-24 DIAGNOSIS — K2270 Barrett's esophagus without dysplasia: Secondary | ICD-10-CM

## 2018-03-24 DIAGNOSIS — K2970 Gastritis, unspecified, without bleeding: Secondary | ICD-10-CM

## 2018-03-24 DIAGNOSIS — K2210 Ulcer of esophagus without bleeding: Secondary | ICD-10-CM

## 2018-03-24 DIAGNOSIS — K222 Esophageal obstruction: Secondary | ICD-10-CM

## 2018-03-24 HISTORY — PX: MALONEY DILATION: SHX5535

## 2018-03-24 HISTORY — PX: ESOPHAGOGASTRODUODENOSCOPY (EGD) WITH PROPOFOL: SHX5813

## 2018-03-24 HISTORY — PX: BIOPSY: SHX5522

## 2018-03-24 LAB — BASIC METABOLIC PANEL
Anion gap: 7 (ref 5–15)
BUN: 11 mg/dL (ref 6–20)
CO2: 26 mmol/L (ref 22–32)
CREATININE: 0.64 mg/dL (ref 0.61–1.24)
Calcium: 7.9 mg/dL — ABNORMAL LOW (ref 8.9–10.3)
Chloride: 105 mmol/L (ref 101–111)
GFR calc Af Amer: >60 mL/min (ref >60)
GFR calc non Af Amer: >60 mL/min (ref >60)
GLUCOSE: 109 mg/dL — AB (ref 65–99)
Potassium: 3.9 mmol/L (ref 3.5–5.1)
SODIUM: 138 mmol/L (ref 135–145)

## 2018-03-24 LAB — CBC WITH DIFFERENTIAL/PLATELET
Basophils Absolute: 0 10*3/uL (ref 0.0–0.1)
Basophils Relative: 0 %
EOS ABS: 0.1 10*3/uL (ref 0.0–0.7)
EOS PCT: 1 %
HCT: 36.6 % — ABNORMAL LOW (ref 39.0–52.0)
Hemoglobin: 11.6 g/dL — ABNORMAL LOW (ref 13.0–17.0)
LYMPHS ABS: 0.8 10*3/uL (ref 0.7–4.0)
Lymphocytes Relative: 12 %
MCH: 30.2 pg (ref 26.0–34.0)
MCHC: 31.7 g/dL (ref 30.0–36.0)
MCV: 95.3 fL (ref 78.0–100.0)
MONO ABS: 1 10*3/uL (ref 0.1–1.0)
Monocytes Relative: 14 %
Neutro Abs: 4.8 10*3/uL (ref 1.7–7.7)
Neutrophils Relative %: 73 %
Platelets: 158 10*3/uL (ref 150–400)
RBC: 3.84 MIL/uL — ABNORMAL LOW (ref 4.22–5.81)
RDW: 14.8 % (ref 11.5–15.5)
WBC: 6.6 10*3/uL (ref 4.0–10.5)

## 2018-03-24 LAB — URINE CULTURE

## 2018-03-24 LAB — GLUCOSE, CAPILLARY
GLUCOSE-CAPILLARY: 112 mg/dL — AB (ref 65–99)
GLUCOSE-CAPILLARY: 117 mg/dL — AB (ref 65–99)
Glucose-Capillary: 105 mg/dL — ABNORMAL HIGH (ref 65–99)
Glucose-Capillary: 107 mg/dL — ABNORMAL HIGH (ref 65–99)
Glucose-Capillary: 119 mg/dL — ABNORMAL HIGH (ref 65–99)
Glucose-Capillary: 147 mg/dL — ABNORMAL HIGH (ref 65–99)

## 2018-03-24 SURGERY — ESOPHAGOGASTRODUODENOSCOPY (EGD) WITH PROPOFOL
Anesthesia: General

## 2018-03-24 MED ORDER — PROPOFOL 500 MG/50ML IV EMUL
INTRAVENOUS | Status: DC | PRN
  Administered 2018-03-24: 100 ug/kg/min via INTRAVENOUS

## 2018-03-24 MED ORDER — PROPOFOL 10 MG/ML IV BOLUS
INTRAVENOUS | Status: DC | PRN
  Administered 2018-03-24: 20 mg via INTRAVENOUS

## 2018-03-24 MED ORDER — PROPOFOL 10 MG/ML IV BOLUS
INTRAVENOUS | Status: AC
  Filled 2018-03-24: qty 40

## 2018-03-24 MED ORDER — OXYBUTYNIN CHLORIDE 5 MG PO TABS
5.0000 mg | ORAL_TABLET | Freq: Two times a day (BID) | ORAL | Status: DC
  Administered 2018-03-24 – 2018-03-30 (×10): 5 mg via ORAL
  Filled 2018-03-24 (×11): qty 1

## 2018-03-24 MED ORDER — LACTATED RINGERS IV SOLN
INTRAVENOUS | Status: DC
  Administered 2018-03-24: 1000 mL via INTRAVENOUS

## 2018-03-24 SURGICAL SUPPLY — 15 items

## 2018-03-24 NOTE — Transfer of Care (Signed)
Immediate Anesthesia Transfer of Care Note  Patient: Warren [name]  Procedure(s) Performed: ESOPHAGOGASTRODUODENOSCOPY (EGD) WITH PROPOFOL (N/A ) BIOPSY MALONEY DILATION  Patient Location: PACU and Endoscopy Unit  Anesthesia Type:MAC  Level of Consciousness: drowsy  Airway & Oxygen Therapy: Patient Spontanous Breathing and Patient connected to nasal cannula oxygen  Post-op Assessment: Report given to RN and Post -op Vital signs reviewed and stable  Post vital signs: Reviewed and stable  Last Vitals:  Vitals Value Taken Time  BP 111/56 03/24/2018  9:35 AM  Temp    Pulse 95 03/24/2018  9:37 AM  Resp 21 03/24/2018  9:37 AM  SpO2 97 % 03/24/2018  9:37 AM  Vitals shown include unvalidated device data.  Last Pain:  Vitals:   03/24/18 0849  TempSrc: Oral  PainSc: 0-No pain         Complications: No apparent anesthesia complications

## 2018-03-24 NOTE — Progress Notes (Signed)
PROGRESS NOTE    Warren [name]  WCB:[id number] DOB: [birth year] DOA: 03/22/2018 PCP: [name], MD    Brief Narrative:  84 year old male who presented with nausea and vomiting.  He does have the significant past medical history for coronary artery disease, emphysema, esophageal stricture, chronic indwelling Foley catheter, anxiety, depression and dementia.  Recent hospitalization for sepsis due to Pseudomonas urinary tract infection.  Patient reported 2 days of abdominal pain, associated with intractable nausea and vomiting, complicated by dyspnea, lethargy, generalized weakness and confusion.  On the initial physical examination blood pressure 135/90, heart rate 92, respiratory 23, oxygen saturation 100%.  Moist mucous membranes, lungs clear to auscultation bilaterally, heart S1-S2 present rhythmic, abdomen soft nontender, no lower extremity edema.  Sodium 136, potassium 3.9, chloride 96, bicarb 28, glucose 135, BUN 13, creatinine 0.86, white count 15.3, hemoglobin 14.5, hematocrit 44.3, platelets 216.  Urinalysis 30 protein, specific gravity 1.015, red cells 11-20, white cells 21-50.  Chest x-ray with low lung volumes, right base atelectasis.  CT chest with fluid-filled, dilated esophagus.  EKG sinus rhythm, left axis deviation, normal intervals.   Patient was admitted to the hospital with the working diagnosis of intractable nausea and vomiting due to esophageal dysmotility.  Assessment & Plan:   Principal Problem:   Nausea and vomiting Active Problems:   Dyslipidemia   Anxiety and depression   Esophageal motility disorder   Urinary tract infection associated with catheterization of urinary tract, initial encounter (HCC)   Leukocytosis   Dementia   Dehydration   Acute metabolic encephalopathy   1. Intractable nausea and vomiting due to esophageal dysmotility. Patient had egd today, sp esophageal dilatation, positive small esophageal ulcer,  Barrett's esophagus, and  gastritis. Diet has been advanced. Will continue as needed antiemetics. Continue proton pump inhibitors.   2. Urine tract infection. History of pseudomonas urine infection in the recent past. Continue meropenem for now, cultures have been no growth. Continue diflucan for yeast. Patient had a recent AUS removal, will keep foley catheter per urology recommendations. Placed on oxybutynin.   3. Acute metabolic encephalopathy in the setting of dementia. Will continue neuro checks per unit protocol, this am patient with no agitation, consult physical therapy.   4. Dehydration. Has responded well to IV fluids, will decrease rate of isotonic saline.      DVT prophylaxis:scd   Code Status:  full Family Communication: no family at the bedside Disposition Plan:  Pending physical therapy evaluation    Consultants:   Urology   Procedures:     Antimicrobials:   Fluconazole  Meropenem.     Subjective: Patient is feeling better, no further nausea or vomiting, reports mild lower abdominal pain, no dyspnea or chest pain.   Objective: Vitals:   03/24/18 0355 03/24/18 0849 03/24/18 0938 03/24/18 0944  BP: 131/83 (!) 149/84 (!) 111/56 (!) 113/53  Pulse: 96 87 95   Resp: 16 (!) 25 19   Temp: 98.7 F (37.1 C) 98.3 F (36.8 C)    TempSrc: Oral Oral    SpO2: 91% 93% 97%   Weight:  74.8 kg (165 lb)    Height:  5\' 7"  (1.702 m)      Intake/Output Summary (Last 24 hours) at 03/24/2018 0948 Last data filed at 03/24/2018 0929 Gross per 24 hour  Intake 3115.62 ml  Output 775 ml  Net 2340.62 ml   Filed Weights   03/22/18 2304 03/23/18 0003 03/24/18 0849  Weight: 74.9 kg (165 lb 2 oz) 74.9 kg (165  lb 2 oz) 74.8 kg (165 lb)    Examination:   General: deconditioned  Neurology: Awake and alert, non focal  E ENT: mild pallor, no icterus, oral mucosa moist Cardiovascular: No JVD. S1-S2 present, rhythmic, no gallops, rubs, or murmurs. Trace lower extremity edema. Pulmonary: vesicular breath  sounds bilaterally, adequate air movement, no wheezing, rhonchi or rales. Gastrointestinal. Abdomen with no organomegaly, non tender, no rebound or guarding Skin. No rashes Musculoskeletal: no joint deformities     Data Reviewed: I have personally reviewed following labs and imaging studies  CBC: Recent Labs  Lab 03/22/18 1606 03/23/18 0524 03/24/18 0541  WBC 15.3* 9.3 6.6  NEUTROABS 13.1*  --  4.8  HGB 14.5 11.8* 11.6*  HCT 44.3 36.7* 36.6*  MCV 94.1 95.1 95.3  PLT 216 184 505   Basic Metabolic Panel: Recent Labs  Lab 03/22/18 1606 03/23/18 0524 03/24/18 0541  NA 136 137 138  K 3.9 3.8 3.9  CL 96* 102 105  CO2 28 26 26   GLUCOSE 135* 109* 109*  BUN 13 11 11   CREATININE 0.86 0.71 0.64  CALCIUM 9.6 8.3* 7.9*   GFR: Estimated Creatinine Clearance: 53.9 mL/min (by C-G formula based on SCr of 0.64 mg/dL). Liver Function Tests: Recent Labs  Lab 03/22/18 1606  AST 15  ALT 11*  ALKPHOS 47  BILITOT 1.3*  PROT 7.3  ALBUMIN 3.8   Recent Labs  Lab 03/22/18 1606  LIPASE 27   No results for input(s): AMMONIA in the last 168 hours. Coagulation Profile: Recent Labs  Lab 03/22/18 1606  INR 1.07   Cardiac Enzymes: No results for input(s): CKTOTAL, CKMB, CKMBINDEX, TROPONINI in the last 168 hours. BNP (last 3 results) No results for input(s): PROBNP in the last 8760 hours. HbA1C: No results for input(s): HGBA1C in the last 72 hours. CBG: Recent Labs  Lab 03/23/18 1913 03/23/18 1957 03/23/18 2344 03/24/18 0352 03/24/18 0756  GLUCAP 87 100* 103* 112* 105*   Lipid Profile: No results for input(s): CHOL, HDL, LDLCALC, TRIG, CHOLHDL, LDLDIRECT in the last 72 hours. Thyroid Function Tests: No results for input(s): TSH, T4TOTAL, FREET4, T3FREE, THYROIDAB in the last 72 hours. Anemia Panel: No results for input(s): VITAMINB12, FOLATE, FERRITIN, TIBC, IRON, RETICCTPCT in the last 72 hours.    Radiology Studies: I have reviewed all of the imaging during  this hospital visit personally     Scheduled Meds: . [MAR Hold] enoxaparin (LOVENOX) injection  40 mg Subcutaneous QHS  . [MAR Hold] mouth rinse  15 mL Mouth Rinse BID   Continuous Infusions: . sodium chloride 100 mL/hr at 03/23/18 2206  . [MAR Hold] famotidine (PEPCID) IV Stopped (03/23/18 2236)  . [MAR Hold] fluconazole (DIFLUCAN) IV Stopped (03/23/18 1028)  . lactated ringers 1,000 mL (03/24/18 0900)  . [MAR Hold] meropenem (MERREM) IV 1 g (03/24/18 0549)     LOS: 2 days        Warren Mans Gerome Apley, MD [facility] Pager [phone]

## 2018-03-24 NOTE — Anesthesia Preprocedure Evaluation (Addendum)
Anesthesia Evaluation  Patient identified by MRN, date of birth, ID band Patient awake    Reviewed: Allergy & Precautions, NPO status , Patient's Chart, lab work & pertinent test results  Airway Mallampati: II  TM Distance: >3 FB Neck ROM: Full    Dental  (+) Dental Advisory Given, Edentulous Upper, Edentulous Lower   Pulmonary shortness of breath, pneumonia, COPD, former smoker,    breath sounds clear to auscultation  (-) stridor     Cardiovascular hypertension, + CAD and + Peripheral Vascular Disease   Rhythm:Regular Rate:Normal     Neuro/Psych PSYCHIATRIC DISORDERS Anxiety Depression Dementia negative neurological ROS     GI/Hepatic negative GI ROS, Neg liver ROS,   Endo/Other  negative endocrine ROS  Renal/GU negative Renal ROS     Musculoskeletal   Abdominal   Peds  Hematology negative hematology ROS (+)   Anesthesia Other Findings   Reproductive/Obstetrics                            Lab Results  Component Value Date   WBC 6.6 03/24/2018   HGB 11.6 (L) 03/24/2018   HCT 36.6 (L) 03/24/2018   MCV 95.3 03/24/2018   PLT 158 03/24/2018   Lab Results  Component Value Date   CREATININE 0.64 03/24/2018   BUN 11 03/24/2018   NA 138 03/24/2018   K 3.9 03/24/2018   CL 105 03/24/2018   CO2 26 03/24/2018    Anesthesia Physical  Anesthesia Plan  ASA: III  Anesthesia Plan: MAC   Post-op Pain Management:    Induction: Intravenous  PONV Risk Score and Plan: 2 and Ondansetron, Dexamethasone and Treatment may vary due to age or medical condition  Airway Management Planned:   Additional Equipment:   Intra-op Plan:   Post-operative Plan:   Informed Consent: I have reviewed the patients History and Physical, chart, labs and discussed the procedure including the risks, benefits and alternatives for the proposed anesthesia with the patient or authorized representative who has  indicated his/her understanding and acceptance.   Dental advisory given  Plan Discussed with: CRNA  Anesthesia Plan Comments:        Anesthesia Quick Evaluation

## 2018-03-24 NOTE — Op Note (Signed)
[facility] Patient Name: Warren [name] Procedure Date: 03/24/2018 MRN: [id number] Attending MD: Jackquline Denmark , MD Date of Birth: 18-Mar-1922 CSN: [id number] Age: 84 Admit Type: Inpatient Procedure:                Upper GI endoscopy Indications:              Dysphagia, abn CT showing dilated esophagus. Providers:                Jackquline Denmark, MD, Elmer Ramp. Tilden Dome, RN, Laurena Spies, Technician Referring MD:              Medicines:                Monitored Anesthesia Care Complications:            No immediate complications. Estimated Blood Loss:     Estimated blood loss: none. Procedure:                Pre-Anesthesia Assessment:                           - Prior to the procedure, a History and Physical                            was performed, and patient medications and                            allergies were reviewed. The patient is unable to                            give consent secondary to the patient's altered                            mental status. The risks and benefits of the                            procedure and the sedation options and risks were                            discussed with the patient's son. All questions                            were answered and informed consent was obtained.                            Patient identification and proposed procedure were                            verified by the physician in the procedure room in                            the endoscopy suite. Mental Status Examination:  alert and oriented. Prophylactic Antibiotics: The                            patient does not require prophylactic antibiotics.                            Prior Anticoagulants: The patient has taken no                            previous anticoagulant or antiplatelet agents. ASA                            Grade Assessment: III - A patient with severe       systemic disease. After reviewing the risks and                            benefits, the patient was deemed in satisfactory                            condition to undergo the procedure. The anesthesia                            plan was to use monitored anesthesia care (MAC).                            Immediately prior to administration of medications,                            the patient was re-assessed for adequacy to receive                            sedatives. The heart rate, respiratory rate, oxygen                            saturations, blood pressure, adequacy of pulmonary                            ventilation, and response to care were monitored                            throughout the procedure. The physical status of                            the patient was re-assessed after the procedure.                           After obtaining informed consent, the endoscope was                            passed under direct vision. Throughout the                            procedure, the patient's blood pressure, pulse, and  oxygen saturations were monitored continuously. The                            [id number]) scope was introduced through the                            mouth, and advanced to the second part of duodenum.                            The upper GI endoscopy was accomplished without                            difficulty. The patient tolerated the procedure                            well. none The upper GI endoscopy was accomplished                            without difficulty. The patient tolerated the                            procedure well. Scope In: Scope Out: Findings:      One superficial 1 cm esophageal ulcer with no bleeding and no stigmata       of recent bleeding was found 30 cm from the incisors at the upper margin       of Barrett's esophagus. Biopsies were taken with a cold forceps for       histology.       Barrett's esophagus was present. Circumferential salmon-colored mucosa       was present from 30 cm up to 40 cm (GE junction). Mucosa was biopsied       with a cold forceps for histology.      One mild stenosis was found 40 cm from the incisors. This stenosis       measured 1.2 cm (inner diameter). The stenosis was traversed. There was       some resistance in passing the scope and a "catch". The esophagus was       mildly dilated. The scope was withdrawn. Dilation was performed with a       Maloney dilator with some resistance at 50 Fr. Estimated blood loss:       none.      Mild inflammation was found in the gastric antrum.      The cardia and gastric fundus were normal on retroflexion. Normal       duodenum. Impression:               - Non-bleeding esophageal ulcer. Biopsied.                           - Barrett's esophagus. Biopsied.                           - Esophageal stenosis. Dilated.                           - Gastritis. Moderate Sedation:      none Recommendation:           -  Return patient to hospital ward for ongoing care.                           - Mechanical soft diet.                           - Use Prilosec (omeprazole) 20 mg PO daily. (can                            open capsule and put it in applesauce)                           - Await pathology results.                           - Feed in sitting position. All medications in                            sitting position.                           - I would recommend barium swallow, possibly                            followed by manometry (not sure if he would be able                            to tolerate) as an outpatient. He could have                            acquired Achalasia. No fundal/distal esophageal                            Masses. Follow-up in the GI clinic in 6-8 weeks. Procedure Code(s):        --- Professional ---                           [phone], Esophagogastroduodenoscopy, flexible,                             transoral; with biopsy, single or multiple                           43450, Dilation of esophagus, by unguided sound or                            bougie, single or multiple passes Diagnosis Code(s):        --- Professional ---                           K22.10, Ulcer of esophagus without bleeding                           K22.70, Barrett's esophagus without dysplasia  K22.2, Esophageal obstruction                           K29.70, Gastritis, unspecified, without bleeding                           R13.10, Dysphagia, unspecified CPT copyright 2017 American Medical Association. All rights reserved. The codes documented in this report are preliminary and upon coder review may  be revised to meet current compliance requirements. Jackquline Denmark, MD 03/24/2018 9:56:28 AM This report has been signed electronically. Number of Addenda: 0

## 2018-03-24 NOTE — Anesthesia Postprocedure Evaluation (Signed)
Anesthesia Post Note  Patient: Warren [name]  Procedure(s) Performed: ESOPHAGOGASTRODUODENOSCOPY (EGD) WITH PROPOFOL (N/A ) [place]     Patient location during evaluation: PACU Anesthesia Type: MAC Level of consciousness: awake and alert Pain management: pain level controlled Vital Signs Assessment: post-procedure vital signs reviewed and stable Respiratory status: spontaneous breathing Cardiovascular status: stable Anesthetic complications: no    Last Vitals:  Vitals:   03/24/18 1117 03/24/18 1249  BP: 116/68 120/75  Pulse: 80 75  Resp: 20 14  Temp: 36.8 C 36.4 C  SpO2: 98% 99%    Last Pain:  Vitals:   03/24/18 1249  TempSrc: Oral  PainSc:                  Nolon Nations

## 2018-03-24 NOTE — Progress Notes (Signed)
Leaking small amouts around catheter Catheter draining well Looks in good position Add oxybutynin for spasms DO NOT REMOVE OR CHANGE CATHTER

## 2018-03-24 NOTE — Anesthesia Procedure Notes (Signed)
Date/Time: 03/24/2018 9:13 AM Performed by: Claudia Desanctis, CRNA Oxygen Delivery Method: Nasal cannula

## 2018-03-24 NOTE — Interval H&P Note (Signed)
History and Physical Interval Note:  03/24/2018 9:04 AM  [name]  has presented today for surgery, with the diagnosis of Dysphagia, abnormal CT of the esophagus  The various methods of treatment have been discussed with the patient and family. After consideration of risks, benefits and other options for treatment, the patient has consented to  Procedure(s): ESOPHAGOGASTRODUODENOSCOPY (EGD) WITH PROPOFOL (N/A) as a surgical intervention .  The patient's history has been reviewed, patient examined, no change in status, stable for surgery.  I have reviewed the patient's chart and labs.  Questions were answered to the patient's satisfaction.     Warren [name]

## 2018-03-25 ENCOUNTER — Encounter (HOSPITAL_COMMUNITY): Payer: Self-pay | Admitting: Gastroenterology

## 2018-03-25 DIAGNOSIS — G9341 Metabolic encephalopathy: Secondary | ICD-10-CM

## 2018-03-25 LAB — BASIC METABOLIC PANEL
ANION GAP: 8 (ref 5–15)
BUN: 12 mg/dL (ref 6–20)
CO2: 28 mmol/L (ref 22–32)
Calcium: 7.9 mg/dL — ABNORMAL LOW (ref 8.9–10.3)
Chloride: 103 mmol/L (ref 101–111)
Creatinine, Ser: 0.58 mg/dL — ABNORMAL LOW (ref 0.61–1.24)
GLUCOSE: 105 mg/dL — AB (ref 65–99)
POTASSIUM: 3.9 mmol/L (ref 3.5–5.1)
SODIUM: 139 mmol/L (ref 135–145)

## 2018-03-25 LAB — CBC WITH DIFFERENTIAL/PLATELET
BASOS ABS: 0 10*3/uL (ref 0.0–0.1)
BASOS PCT: 0 %
EOS ABS: 0.3 10*3/uL (ref 0.0–0.7)
EOS PCT: 4 %
HCT: 34.8 % — ABNORMAL LOW (ref 39.0–52.0)
Hemoglobin: 11 g/dL — ABNORMAL LOW (ref 13.0–17.0)
LYMPHS PCT: 12 %
Lymphs Abs: 0.7 10*3/uL (ref 0.7–4.0)
MCH: 30 pg (ref 26.0–34.0)
MCHC: 31.6 g/dL (ref 30.0–36.0)
MCV: 94.8 fL (ref 78.0–100.0)
MONO ABS: 0.8 10*3/uL (ref 0.1–1.0)
Monocytes Relative: 13 %
Neutro Abs: 4.4 10*3/uL (ref 1.7–7.7)
Neutrophils Relative %: 71 %
PLATELETS: 167 10*3/uL (ref 150–400)
RBC: 3.67 MIL/uL — AB (ref 4.22–5.81)
RDW: 14.7 % (ref 11.5–15.5)
WBC: 6.2 10*3/uL (ref 4.0–10.5)

## 2018-03-25 LAB — GLUCOSE, CAPILLARY
GLUCOSE-CAPILLARY: 92 mg/dL (ref 65–99)
GLUCOSE-CAPILLARY: 97 mg/dL (ref 65–99)
GLUCOSE-CAPILLARY: 120 mg/dL — AB (ref 65–99)
GLUCOSE-CAPILLARY: 95 mg/dL (ref 65–99)
GLUCOSE-CAPILLARY: 96 mg/dL (ref 65–99)
Glucose-Capillary: 126 mg/dL — ABNORMAL HIGH (ref 65–99)

## 2018-03-25 MED ORDER — OXYBUTYNIN CHLORIDE 5 MG PO TABS: 5.0000 mg | ORAL_TABLET | Freq: Two times a day (BID) | ORAL | 0 refills | Status: DC

## 2018-03-25 MED ORDER — CIPROFLOXACIN HCL 500 MG PO TABS: 500.0000 mg | ORAL_TABLET | Freq: Two times a day (BID) | ORAL | 0 refills | Status: DC

## 2018-03-25 MED ORDER — OMEPRAZOLE MAGNESIUM 20 MG PO TBEC: 20.0000 mg | DELAYED_RELEASE_TABLET | Freq: Every day | ORAL | 0 refills | Status: DC

## 2018-03-25 MED ORDER — FLUCONAZOLE 100 MG PO TABS: 100.0000 mg | ORAL_TABLET | Freq: Every day | ORAL | 0 refills | Status: DC

## 2018-03-25 NOTE — Progress Notes (Signed)
Pt vomited as he was being fed lunch. Pt is currently on a soft diet with pudding thick fluid consistency. Oral suctioning with a yankauer was initiated this morning. Pt has been spitting up thick, frothy mucus throughout the day, as well as coughing and deep breathing with encouragement.   Discharged was arranged for pt to return to [facility] with PO antibiotics. With the pt currently on aspiration precautions and unable to keep down soft food, D/C today may not be appropriate.   Dr.Vega was paged regarding above concerns. A swallow evaluation was requested. Dr. Wendee Beavers advised to order a swallow evaluation prior to D/C to facility. Verbal order has been put in.

## 2018-03-25 NOTE — Progress Notes (Signed)
Patient has been having difficulty with swallowing. Nursing reporting concerns about patient having poor oral intake and some emesis.  Will place speech therapy evaluation. They have recommended modified barium swallow.  Plan is to monitor further and possibly d/c next am.  Velvet Bathe MD

## 2018-03-25 NOTE — Evaluation (Signed)
SLP Cancellation Note  Patient Details Name: Warren [name] MRN: [id number] DOB: 06-19-1922   Cancelled treatment:       Reason Eval/Treat Not Completed: Other (comment)(recommend to proceed with MBS next date due to h/o oropharyngeal and concern for aspiration, MD paged) and orders received.    Macario Golds 03/25/2018, 3:11 PM   Luanna Salk, [facility] SLP [phone]

## 2018-03-25 NOTE — Care Management Important Message (Signed)
Important Message  Patient Details  Name: JERSON FURUKAWA MRN: [id number] Date of Birth: 04/19/1922   Medicare Important Message Given:  Yes    Kerin Salen 03/25/2018, 11:29 AMImportant Message  Patient Details  Name: DAWN KIPER MRN: [id number] Date of Birth: September 03, 1922   Medicare Important Message Given:  Yes    Kerin Salen 03/25/2018, 11:29 AM

## 2018-03-25 NOTE — Consult Note (Signed)
   [facility] CM Inpatient Consult   03/25/2018  ACIE CUSTIS 09-16-1922 [id number]    Patient screened for potential [place] Care Management needs due to hospital readmission.   Chart reviewed. Noted patient slated for discharge to SNF. No identifiable [facility] Care Management needs at this time.   Marthenia Rolling, MSN-Ed, RN,BSN [facility] Liaison [phone]

## 2018-03-25 NOTE — Evaluation (Signed)
Physical Therapy Evaluation Patient Details Name: Warren [name] MRN: [id number] DOB: [birth year] Today's Date: 03/25/2018   History of Present Illness  -84 year-old male who presented 03/22/18 with nausea and vomiting.  past medical history for coronary artery disease, emphysema, esophageal stricture, chronic indwelling Foley catheter, anxiety, depression and dementia.  Recent hospitalization for sepsis due to Pseudomonas urinary tract infection. DC to SNF.  Clinical Impression  Patient with gurgling from throat. . Reports feeling like throwing up. Assisted patient to bed edge. Coughed up thick secretions and some food. RN in room. Elita Boone provided. Pt admitted with above diagnosis. Pt currently with functional limitations due to the deficits listed below (see PT Problem List).  Pt will benefit from skilled PT to increase their independence and safety with mobility to allow discharge to the venue listed below.       Follow Up Recommendations SNF    Equipment Recommendations  None recommended by PT    Recommendations for Other Services       Precautions / Restrictions Precautions Precautions: Fall Precaution Comments: strict swallow precautions      Mobility  Bed Mobility Overal bed mobility: Needs Assistance Bed Mobility: Supine to Sit     Supine to sit: Min assist        Transfers Overall transfer level: Needs assistance Equipment used: Rolling walker (2 wheeled) Transfers: Sit to/from [facility] Sit to Stand: Min assist Stand pivot transfers: Min assist       General transfer comment: Assist to rise, stabilize, control descent. VCs safety, hand placement.   Ambulation/Gait                Stairs            Wheelchair Mobility    Modified Rankin (Stroke Patients Only)       Balance                                             Pertinent Vitals/Pain Pain Assessment: No/denies pain    Home Living  Family/patient expects to be discharged to:: Skilled nursing facility                      Prior Function                 Hand Dominance        Extremity/Trunk Assessment   Upper Extremity Assessment Upper Extremity Assessment: Generalized weakness    Lower Extremity Assessment Lower Extremity Assessment: Generalized weakness    Cervical / Trunk Assessment Cervical / Trunk Assessment: Kyphotic  Communication      Cognition Arousal/Alertness: Awake/alert Behavior During Therapy: WFL for tasks assessed/performed Overall Cognitive Status: History of cognitive impairments - at baseline                                        General Comments      Exercises     Assessment/Plan    PT Assessment Patient needs continued PT services  PT Problem List Decreased strength;Decreased balance;Decreased mobility;Decreased activity tolerance;Decreased knowledge of use of DME       PT Treatment Interventions DME instruction;Gait training;Functional mobility training;Balance training;Therapeutic activities;Patient/family education;Therapeutic exercise    PT Goals (Current goals can be found in the Care Plan section)  Acute Rehab PT Goals Patient Stated Goal: to get up and go PT Goal Formulation: With patient Time For Goal Achievement: 04/08/18 Potential to Achieve Goals: Good    Frequency Min 2X/week   Barriers to discharge        Co-evaluation               AM-PAC PT "6 Clicks" Daily Activity  Outcome Measure Difficulty turning over in bed (including adjusting bedclothes, sheets and blankets)?: A Little Difficulty moving from lying on back to sitting on the side of the bed? : A Little Difficulty sitting down on and standing up from a chair with arms (e.g., wheelchair, bedside commode, etc,.)?: A Lot Help needed moving to and from a bed to chair (including a wheelchair)?: A Lot Help needed walking in hospital room?: A Lot Help needed  climbing 3-5 steps with a railing? : Total 6 Click Score: 13    End of Session Equipment Utilized During Treatment: Gait belt Activity Tolerance: Patient tolerated treatment well Patient left: in chair;with call bell/phone within reach;with chair alarm set Nurse Communication: Mobility status PT Visit Diagnosis: Muscle weakness (generalized) (M62.81);Difficulty in walking, not elsewhere classified (R26.2)    Time: 1104-1130 PT Time Calculation (min) (ACUTE ONLY): 26 min   Charges:   PT Evaluation $PT Eval Low Complexity: 1 Low PT Treatments $Therapeutic Activity: 8-22 mins   PT G CodesTresa Endo PT [phone]   Claretha Cooper 03/25/2018, 12:13 PM

## 2018-03-25 NOTE — Clinical Social Work Placement (Signed)
   CLINICAL SOCIAL WORK PLACEMENT  NOTE  Date:  03/25/2018  Patient Details  Name: [name] MRN: [id number] Date of Birth: [birth year]  Clinical Social Work is seeking post-discharge placement for this patient at the [place] level of care (*CSW will initial, date and re-position this form in  chart as items are completed):  Yes   Patient/family provided with [facility] Work Department's list of facilities offering this level of care within the geographic area requested by the patient (or if unable, by the patient's family).  Yes   Patient/family informed of their freedom to choose among providers that offer the needed level of care, that participate in Medicare, Medicaid or managed care program needed by the patient, have an available bed and are willing to accept the patient.      Patient/family informed of [facility]'s ownership interest in [facility] and [facility], as well as of the fact that they are under no obligation to receive care at these facilities.  PASRR submitted to EDS on       PASRR number received on       Existing PASRR number confirmed on 03/25/18     FL2 transmitted to all facilities in geographic area requested by pt/family on       FL2 transmitted to all facilities within larger geographic area on       Patient informed that his/her managed care company has contracts with or will negotiate with certain facilities, including the following:        Yes   Patient/family informed of bed offers received.  Patient chooses bed at [facility] Rehab     Physician recommends and patient chooses bed at      Patient to be transferred to [facility] and Rehab on 03/25/18.  Patient to be transferred to facility by [facility]AR     Patient family notified on 03/25/18 of transfer.  Name of family member notified:  [name]     PHYSICIAN       Additional Comment:     _______________________________________________ Lia Hopping, LCSW 03/25/2018, 1:34 PM

## 2018-03-25 NOTE — NC FL2 (Signed)
[place] LEVEL OF CARE SCREENING TOOL     IDENTIFICATION  Patient Name: Warren [name] Birthdate: Dec 16, 1921 Sex: male Admission Date (Current Location): 03/22/2018  [facility] and [place] Number:  Herbalist and Address:  [facility],  [place] [address], [place]      Provider Number: [id number]  Attending Physician Name and Address:  Velvet Bathe, MD  Relative Name and Phone Number:       Current Level of Care: Hospital Recommended Level of Care: [place] Prior Approval Number:    Date Approved/Denied:   PASRR Number: [id number]  Discharge Plan: SNF    Current Diagnoses: Patient Active Problem List   Diagnosis Date Noted  . Esophageal motility disorder 03/23/2018  . Urinary tract infection associated with catheterization of urinary tract, initial encounter ([place]) 03/23/2018  . Leukocytosis 03/23/2018  . Dementia 03/23/2018  . Dehydration   . Acute metabolic encephalopathy   . Nausea and vomiting 03/22/2018  . Pseudomonas urinary tract infection 03/06/2018  . Depression 03/06/2018  . Anxiety and depression 03/06/2018  . Osteoporosis 03/06/2018  . Acute lower UTI 03/01/2018  . Internal device, implant, or graft infection or inflammation ([place]) 03/01/2018  . Emphysema   . Thyroid nodule   . Carotid artery disease ([place])   . CAD (coronary artery disease)   . Edema extremities   . Claudication ([place]) 07/30/2011  . Dyslipidemia 07/03/2010  . HYPERTENSION, BENIGN 07/03/2010  . PERIPHERAL EDEMA 07/03/2010  . SHORTNESS OF BREATH 07/03/2010  . PRECORDIAL PAIN 07/03/2010    Orientation RESPIRATION BLADDER Height & Weight     Self(Memory Impairment)  Normal Continent, Indwelling catheter Weight: 165 lb (74.8 kg) Height:  5\' 7"  (170.2 cm)  BEHAVIORAL SYMPTOMS/MOOD NEUROLOGICAL BOWEL NUTRITION STATUS      Continent Diet(See discharge list )  AMBULATORY STATUS COMMUNICATION OF NEEDS Skin   Extensive  Assist Verbally Surgical wounds(Perineum )                       Personal Care Assistance Level of Assistance  Bathing, Feeding, Dressing Bathing Assistance: Limited assistance Feeding assistance: Independent Dressing Assistance: Limited assistance     Functional Limitations Info  Sight, Speech, Hearing Sight Info: Adequate Hearing Info: Impaired Speech Info: Adequate    SPECIAL CARE FACTORS FREQUENCY  PT (By licensed PT), OT (By licensed OT)     PT Frequency: 5x/week OT Frequency: 5x/week            Contractures Contractures Info: Not present    Additional Factors Info  Code Status, Allergies Code Status Info: Fullcode Allergies Info: Allergies: Aspirin, Latex, Penicillins           Current Medications (03/25/2018):  This is the current hospital active medication list Current Facility-Administered Medications  Medication Dose Route Frequency Provider Last Rate Last Dose  . bisacodyl (DULCOLAX) suppository 10 mg  10 mg Rectal Daily PRN Fuller Plan A, MD      . enoxaparin (LOVENOX) injection 40 mg  40 mg Subcutaneous QHS Smith, Rondell A, MD   40 mg at 03/24/18 2145  . famotidine (PEPCID) IVPB 20 mg premix  20 mg Intravenous Q12H Charlesetta Shanks, MD   Stopped at 03/24/18 2153  . fluconazole (DIFLUCAN) IVPB 100 mg  100 mg Intravenous Q24H Eugenie Filler, MD 50 mL/hr at 03/24/18 1329    . ipratropium-albuterol (DUONEB) 0.5-2.5 (3) MG/3ML nebulizer solution 3 mL  3 mL Nebulization Q4H PRN Smith, Rondell A,  MD      . LORazepam (ATIVAN) injection 0.5 mg  0.5 mg Intravenous BID BM & HS PRN Fuller Plan A, MD   0.5 mg at 03/22/18 2235  . MEDLINE mouth rinse  15 mL Mouth Rinse BID Fuller Plan A, MD   15 mL at 03/24/18 2117  . meropenem (MERREM) 1 g in sodium chloride 0.9 % 100 mL IVPB  1 g Intravenous Q8H Charlesetta Shanks, MD   Stopped at 03/25/18 0700  . ondansetron (ZOFRAN) tablet 4 mg  4 mg Oral Q6H PRN Fuller Plan A, MD       Or  . ondansetron (ZOFRAN)  injection 4 mg  4 mg Intravenous Q6H PRN Fuller Plan A, MD   4 mg at 03/24/18 0914  . oxybutynin (DITROPAN) tablet 5 mg  5 mg Oral BID Bjorn Loser, MD   5 mg at 03/24/18 2116     Discharge Medications: Please see discharge summary for a list of discharge medications.  Relevant Imaging Results:  Relevant Lab Results:   Additional Information ss#[id number]  Lia Hopping, LCSW

## 2018-03-25 NOTE — Discharge Summary (Signed)
Physician Discharge Summary  Warren [name]:[id number] DOB: [birth year] DOA: 03/22/2018  PCP: Warren Burly, MD  Admit date: 03/22/2018 Discharge date: 03/25/2018  Time spent: 35 minutes  Recommendations for Outpatient Follow-up:  1. Monitor hemoglobin levels 2. Continue patient on PPI and ensure follow-up with gastroenterologist last seen by Dr. Lyndel [name], Warren [name] 3. Ensure follow up with Urologist: last seen by Dr. Bjorn [name]   Discharge Diagnoses:  Principal Problem:   Nausea and vomiting Active Problems:   Dyslipidemia   Anxiety and depression   Esophageal motility disorder   Urinary tract infection associated with catheterization of urinary tract, initial encounter ([name])   Leukocytosis   Dementia   Dehydration   Acute metabolic encephalopathy   Discharge Condition: stable  Diet recommendation: soft diet  Filed Weights   03/22/18 2304 03/23/18 0003 03/24/18 0849  Weight: 74.9 kg (165 lb 2 oz) 74.9 kg (165 lb 2 oz) 74.8 kg (165 lb)    History of present illness:  84 year old male who presented with nausea and vomiting.  He does have the significant past medical history for coronary artery disease, emphysema, esophageal stricture, chronic indwelling Foley catheter, anxiety, depression and dementia.    Presenting with intractable n/v and esophageal dysmotility, UTI, and acute metabolic encephalopathy  Hospital Course:  1. Intractable nausea and vomiting due to esophageal dysmotility. Patient had egd today, sp esophageal dilatation, positive small esophageal ulcer,  Barrett's esophagus, and gastritis. Diet has been advanced.  Continue proton pump inhibitors.   2. Urine tract infection. History of pseudomonas urine infection in the recent past. Continue meropenem for now, cultures have been no growth. Continue diflucan for yeast. Patient had a recent AUS removal, will keep foley catheter per urology recommendations. Placed on oxybutynin.  -We will discharge on  7-day treatment course for yeast with Diflucan and 7-day treatment course of antibiotic for possible bacterial infection.  3. Acute metabolic encephalopathy in the setting of dementia.  Resolved on most likely secondary to UTI  4. Dehydration. Resolved with fluid administration  Procedures:  None  Consultations:  Urology, GI  Discharge Exam: Vitals:   03/24/18 1945 03/25/18 0410  BP: 128/73 119/73  Pulse: 78 66  Resp: 15 15  Temp: 98.4 F (36.9 C) 98.3 F (36.8 C)  SpO2: 100% 97%    General: Pt in and, alert and awake Cardiovascular: rrr, no rubs Respiratory: no increased wob, no wheezes  Discharge Instructions   Discharge Instructions    Call MD for:  severe uncontrolled pain   Complete by:  As directed    Call MD for:  temperature >100.4   Complete by:  As directed    Diet - low sodium heart healthy   Complete by:  As directed    Discharge instructions   Complete by:  As directed    Please have patient f/u with urologist and gastroenterologist after hospital discharge.   Increase activity slowly   Complete by:  As directed      Allergies as of 03/25/2018      Reactions   Aspirin    REACTION: SPOTS REACTION: SPOTS   Latex    unknown   Penicillins    Has patient had a PCN reaction causing immediate rash, facial/tongue/throat swelling, SOB or lightheadedness with hypotension:No Has patient had a PCN reaction causing severe rash involving mucus membranes or skin necrosis: No Has patient had a PCN reaction that required hospitalization:NoHas patient had a PCN reaction occurring within the last 10 years:No If all of the  above answers are "NO", then may proceed with Cephalosporin use.      Medication List    TAKE these medications   alendronate 70 MG tablet Commonly known as:  FOSAMAX Take 70 mg by mouth once a week. Take 70 mg once a week in the morning with a full glass of water on an empty stomach. Do not lay down for 30 minutes   bisacodyl 10 MG  suppository Commonly known as:  DULCOLAX Place 10 mg rectally daily as needed for moderate constipation.   CALCIUM 600 600 MG Tabs tablet Generic drug:  calcium carbonate Take 600 mg by mouth 2 (two) times daily.   ciprofloxacin 500 MG tablet Commonly known as:  CIPRO Take 1 tablet (500 mg total) by mouth 2 (two) times daily for 4 days.   citalopram 20 MG tablet Commonly known as:  CELEXA Take 20 mg by mouth daily.   fluconazole 100 MG tablet Commonly known as:  DIFLUCAN Take 1 tablet (100 mg total) by mouth daily for 4 days. Start taking on:  03/26/2018   LORazepam 1 MG tablet Commonly known as:  ATIVAN Take 1 tablet (1 mg total) by mouth at bedtime.   magnesium hydroxide 400 MG/5ML suspension Commonly known as:  MILK OF MAGNESIA Take 30 mLs by mouth daily as needed for mild constipation.   MYRBETRIQ 50 MG Tb24 tablet Generic drug:  mirabegron ER Take 50 mg by mouth daily.   omeprazole 20 MG tablet Commonly known as:  PRILOSEC OTC Take 1 tablet (20 mg total) by mouth daily.   oxybutynin 5 MG tablet Commonly known as:  DITROPAN Take 1 tablet (5 mg total) by mouth 2 (two) times daily.   pravastatin 40 MG tablet Commonly known as:  PRAVACHOL Take 40 mg by mouth daily. What changed:  Another medication with the same name was removed. Continue taking this medication, and follow the directions you see here.   Vitamin D3 10000 units Tabs Take 2,000 Units by mouth daily.      Allergies  Allergen Reactions  . Aspirin     REACTION: SPOTS REACTION: SPOTS  . Latex     unknown  . Penicillins     Has patient had a PCN reaction causing immediate rash, facial/tongue/throat swelling, SOB or lightheadedness with hypotension:No Has patient had a PCN reaction causing severe rash involving mucus membranes or skin necrosis: No Has patient had a PCN reaction that required hospitalization:NoHas patient had a PCN reaction occurring within the last 10 years:No If all of the above  answers are "NO", then may proceed with Cephalosporin use.    Follow-up Information    Warren, Laban Emperor, PA-C Follow up on 04/30/2018.   Specialty:  Gastroenterology Why:  11:00 am Contact information: [place] [address] [phone]            The results of significant diagnostics from this hospitalization (including imaging, microbiology, ancillary and laboratory) are listed below for reference.    Significant Diagnostic Studies: Dg Chest 2 View  Result Date: 03/22/2018 CLINICAL DATA:  Difficulty swallowing for the past 24 hours. EXAM: CHEST - 2 VIEW COMPARISON:  03/02/2018 FINDINGS: The cardiac silhouette, mediastinal and hilar contours are stable. There is tortuosity and calcification of the thoracic aorta. Low lung volumes with vascular crowding and streaky bibasilar atelectasis. No definite pleural effusions. The bony thorax is grossly intact. IMPRESSION: Low lung volumes with vascular crowding and basilar atelectasis. No definite infiltrates or effusions. Electronically Signed  By: Marijo Sanes M.D.   On: 03/22/2018 17:08   Dg Chest 2 View  Result Date: 03/02/2018 CLINICAL DATA:  Cough and chest congestion for the past 3-4 days. EXAM: CHEST - 2 VIEW COMPARISON:  02/19/2018. FINDINGS: Again demonstrated is a poor inspiration. Mild bibasilar atelectasis. Normal sized heart. Tortuous and calcified thoracic aorta. Mild diffuse peribronchial thickening. Mild thoracic spine degenerative changes. Diffuse osteopenia. Cholecystectomy clips. Gas and fluid distended stomach. IMPRESSION: 1. Poor inspiration with mild bibasilar atelectasis. 2. Mild bronchitic changes. 3. Gastric distention. Electronically Signed   By: Claudie Revering M.D.   On: 03/02/2018 21:08   Ct Chest W Contrast  Result Date: 03/22/2018 CLINICAL DATA:  Difficulty swallowing. Nausea and vomiting. Hypertension. Emphysema. EXAM: CT CHEST, ABDOMEN, AND PELVIS WITH CONTRAST TECHNIQUE: Multidetector CT imaging  of the chest, abdomen and pelvis was performed following the standard protocol during bolus administration of intravenous contrast. CONTRAST:  13mL ISOVUE-300 IOPAMIDOL (ISOVUE-300) INJECTION 61% COMPARISON:  Abdominopelvic CT 02/19/2018. Chest radiograph earlier today. FINDINGS: CT CHEST FINDINGS Cardiovascular: Aortic and branch vessel atherosclerosis. 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 Normal heart size, without pericardial effusion. Multivessel coronary artery atherosclerosis. Mediastinum/Nodes: Right-sided thyroid enlargement with a 1.2 cm nodule within, nonspecific. No mediastinal or hilar adenopathy. The esophagus is dilated and fluid-filled, including on image 18/3. No well-defined obstructive mass. Lungs/Pleura: Small left-sided pleural effusion. Bibasilar volume loss with diaphragmatic elevation and overlying subsegmental atelectasis in both lung bases. Musculoskeletal: No acute osseous abnormality. CT ABDOMEN PELVIS FINDINGS Hepatobiliary: Normal liver. Cholecystectomy, without biliary ductal dilatation. Pancreas: Normal, without mass or ductal dilatation. Spleen: Normal in size, without focal abnormality. Adrenals/Urinary Tract: Mild left adrenal nodularity. Normal right adrenal gland. Normal kidneys, without hydronephrosis. Foley catheter within the urinary bladder. The bladder is not entirely decompressed. Mildly thick walled bladder. Stomach/Bowel: Normal stomach, without wall thickening. Scattered colonic diverticula. Normal terminal ileum. Normal small bowel. Vascular/Lymphatic: Aortic and branch vessel atherosclerosis. No abdominopelvic adenopathy. Reproductive: Normal prostate. Other: No significant free fluid. Decreased anterior pelvic wall edema. No free intraperitoneal air. Musculoskeletal: Osteopenia. Presumably degenerative partial fusion of the bilateral sacroiliac joints. Lumbosacral spondylosis. IMPRESSION: 1. Fluid-filled, dilated esophagus. This  could represent esophageal dysmotility or an otherwise occult obstructive lesion. Consider nonemergent esophagram. 2. No other acute process in the chest abdomen or pelvis. 3. Foley catheter within the urinary bladder. Wall thickening within the bladder may represent cystitis. Improved presumed cellulitis about the ventral pelvic wall. Lack of entire bladder decompression may suggest Foley catheter dysfunction. 4. Coronary artery atherosclerosis. Aortic Atherosclerosis (ICD10-I70.0). 5. Low lung volumes with diaphragmatic elevation bilaterally. Electronically Signed   By: Abigail Miyamoto M.D.   On: 03/22/2018 18:51   Ct Abdomen Pelvis W Contrast  Result Date: 03/22/2018 CLINICAL DATA:  Difficulty swallowing. Nausea and vomiting. Hypertension. Emphysema. EXAM: CT CHEST, ABDOMEN, AND PELVIS WITH CONTRAST TECHNIQUE: Multidetector CT imaging of the chest, abdomen and pelvis was performed following the standard protocol during bolus administration of intravenous contrast. CONTRAST:  180mL ISOVUE-300 IOPAMIDOL (ISOVUE-300) INJECTION 61% COMPARISON:  Abdominopelvic CT 02/19/2018. Chest radiograph earlier today. FINDINGS: CT CHEST FINDINGS Cardiovascular: Aortic and branch vessel atherosclerosis. 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 Normal heart size, without pericardial effusion. Multivessel coronary artery atherosclerosis. Mediastinum/Nodes: Right-sided thyroid enlargement with a 1.2 cm nodule within, nonspecific. No mediastinal or hilar adenopathy. The esophagus is dilated and fluid-filled, including on image 18/3. No well-defined obstructive mass. Lungs/Pleura: Small left-sided pleural effusion. Bibasilar volume loss with diaphragmatic elevation and overlying subsegmental  atelectasis in both lung bases. Musculoskeletal: No acute osseous abnormality. CT ABDOMEN PELVIS FINDINGS Hepatobiliary: Normal liver. Cholecystectomy, without biliary ductal dilatation. Pancreas: Normal, without  mass or ductal dilatation. Spleen: Normal in size, without focal abnormality. Adrenals/Urinary Tract: Mild left adrenal nodularity. Normal right adrenal gland. Normal kidneys, without hydronephrosis. Foley catheter within the urinary bladder. The bladder is not entirely decompressed. Mildly thick walled bladder. Stomach/Bowel: Normal stomach, without wall thickening. Scattered colonic diverticula. Normal terminal ileum. Normal small bowel. Vascular/Lymphatic: Aortic and branch vessel atherosclerosis. No abdominopelvic adenopathy. Reproductive: Normal prostate. Other: No significant free fluid. Decreased anterior pelvic wall edema. No free intraperitoneal air. Musculoskeletal: Osteopenia. Presumably degenerative partial fusion of the bilateral sacroiliac joints. Lumbosacral spondylosis. IMPRESSION: 1. Fluid-filled, dilated esophagus. This could represent esophageal dysmotility or an otherwise occult obstructive lesion. Consider nonemergent esophagram. 2. No other acute process in the chest abdomen or pelvis. 3. Foley catheter within the urinary bladder. Wall thickening within the bladder may represent cystitis. Improved presumed cellulitis about the ventral pelvic wall. Lack of entire bladder decompression may suggest Foley catheter dysfunction. 4. Coronary artery atherosclerosis. Aortic Atherosclerosis (ICD10-I70.0). 5. Low lung volumes with diaphragmatic elevation bilaterally. Electronically Signed   By: Abigail Miyamoto M.D.   On: 03/22/2018 18:51    Microbiology: Recent Results (from the past 240 hour(s))  Culture, blood (Routine x 2)     Status: None (Preliminary result)   Collection Time: 03/22/18  4:06 PM  Result Value Ref Range Status   Specimen Description   Final    BLOOD LEFT ANTECUBITAL Performed at [facility] [address], [place], [place] [address]    Special Requests   Final    BOTTLES DRAWN AEROBIC AND ANAEROBIC Blood Culture results may not be optimal due to an  excessive volume of blood received in culture bottles Performed at [facility] [address], [place], [place] [address]    Culture   Final    NO GROWTH 3 DAYS Performed at [facility], [place] [address] [place], [place] [address]    Report Status PENDING  Incomplete  Urine culture     Status: Abnormal   Collection Time: 03/22/18  6:12 PM  Result Value Ref Range Status   Specimen Description   Final    URINE, RANDOM Performed at [facility] [address] [place], [place] [address]    Special Requests   Final    NONE Performed at [facility], [place] [address] [place], [place] [address]    Culture MULTIPLE SPECIES PRESENT, SUGGEST RECOLLECTION (A)  Final   Report Status 03/24/2018 FINAL  Final  Culture, blood (Routine x 2)     Status: None (Preliminary result)   Collection Time: 03/22/18  7:01 PM  Result Value Ref Range Status   Specimen Description   Final    BLOOD LEFT HAND Performed at [facility] [address], [place], [place] [address]    Special Requests   Final    BOTTLES DRAWN AEROBIC AND ANAEROBIC Blood Culture adequate volume Performed at [facility] [address], [place], [place] [address]    Culture   Final    NO GROWTH 3 DAYS Performed at [facility], [place] [address] [place], [place] [address]    Report Status PENDING  Incomplete     Labs: Basic Metabolic Panel: Recent Labs  Lab 03/22/18 1606 03/23/18 0524 03/24/18 0541 03/25/18 0530  NA 136 137  138 139  K 3.9 3.8 3.9 3.9  CL 96* 102 105 103  CO2 28 26 26 28   GLUCOSE 135* 109* 109* 105*  BUN 13 11 11 12   CREATININE 0.86 0.71 0.64 0.58*  CALCIUM 9.6 8.3* 7.9* 7.9*   Liver Function Tests: Recent Labs  Lab 03/22/18 1606  AST 15  ALT 11*  ALKPHOS 47  BILITOT 1.3*  PROT 7.3  ALBUMIN 3.8   Recent Labs  Lab 03/22/18 1606  LIPASE 27   No results for input(s): AMMONIA in  the last 168 hours. CBC: Recent Labs  Lab 03/22/18 1606 03/23/18 0524 03/24/18 0541 03/25/18 0530  WBC 15.3* 9.3 6.6 6.2  NEUTROABS 13.1*  --  4.8 4.4  HGB 14.5 11.8* 11.6* 11.0*  HCT 44.3 36.7* 36.6* 34.8*  MCV 94.1 95.1 95.3 94.8  PLT 216 184 158 167   Cardiac Enzymes: No results for input(s): CKTOTAL, CKMB, CKMBINDEX, TROPONINI in the last 168 hours. BNP: BNP (last 3 results) Recent Labs    03/22/18 1606  BNP 39.0    ProBNP (last 3 results) No results for input(s): PROBNP in the last 8760 hours.  CBG: Recent Labs  Lab 03/24/18 2335 03/25/18 0413 03/25/18 0750 03/25/18 1201 03/25/18 1203  GLUCAP 117* 92 97 120* 126*       Signed:  Velvet Bathe MD.  [facility] 03/25/2018, 1:22 PM

## 2018-03-26 ENCOUNTER — Inpatient Hospital Stay (HOSPITAL_COMMUNITY): Payer: Medicare Other

## 2018-03-26 LAB — GLUCOSE, CAPILLARY
GLUCOSE-CAPILLARY: 127 mg/dL — AB (ref 65–99)
GLUCOSE-CAPILLARY: 81 mg/dL (ref 65–99)
Glucose-Capillary: 87 mg/dL (ref 65–99)
Glucose-Capillary: 93 mg/dL (ref 65–99)

## 2018-03-26 MED ORDER — RESOURCE THICKENUP CLEAR PO POWD
ORAL | Status: DC | PRN
  Filled 2018-03-26 (×2): qty 125

## 2018-03-26 NOTE — Plan of Care (Addendum)
Patient attempted to eat about 25% nutri-grain bar and then few sips of ensure. He took small bites and sips as instructed.  Patient also ate 1 container of apple sauce.  Showed no s/sx of distress, no coughing or gagging noted. However, within about 5 minutes - patient vommitted most intake into emesis bag and began to cough after vommitting.   Patient continues to struggle with any intake at this time.

## 2018-03-26 NOTE — Progress Notes (Signed)
PROGRESS NOTE    Warren [name]  ZOX:[id number] DOB: [birth year] DOA: 03/22/2018 PCP: [name], MD    Brief Narrative:  84 year old male who presented with nausea and vomiting.  He does have the significant past medical history for coronary artery disease, emphysema, esophageal stricture, chronic indwelling Foley catheter, anxiety, depression and dementia.  Recent hospitalization for sepsis due to Pseudomonas urinary tract infection.  Patient reported 2 days of abdominal pain, associated with intractable nausea and vomiting, complicated by dyspnea, lethargy, generalized weakness and confusion.  On the initial physical examination blood pressure 135/90, heart rate 92, respiratory 23, oxygen saturation 100%.  Moist mucous membranes, lungs clear to auscultation bilaterally, heart S1-S2 present rhythmic, abdomen soft nontender, no lower extremity edema.  Sodium 136, potassium 3.9, chloride 96, bicarb 28, glucose 135, BUN 13, creatinine 0.86, white count 15.3, hemoglobin 14.5, hematocrit 44.3, platelets 216.  Urinalysis 30 protein, specific gravity 1.015, red cells 11-20, white cells 21-50.  Chest x-ray with low lung volumes, right base atelectasis.  CT chest with fluid-filled, dilated esophagus.  EKG sinus rhythm, left axis deviation, normal intervals.   Patient was admitted to the hospital with the working diagnosis of intractable nausea and vomiting due to esophageal dysmotility.  Assessment & Plan:   Principal Problem:   Nausea and vomiting Active Problems:   Dyslipidemia   Anxiety and depression   Esophageal motility disorder   Urinary tract infection associated with catheterization of urinary tract, initial encounter ([facility])   Leukocytosis   Dementia   Dehydration   Acute metabolic encephalopathy   1. Intractable nausea and vomiting due to esophageal dysmotility. Patient had egd today, sp esophageal dilatation, positive small esophageal ulcer,  Barrett's esophagus, and  gastritis. Diet has been advanced. Will continue as needed antiemetics. Continue proton pump inhibitors.   -The patient is still having poor oral intake with emesis after consumption of food.  Modified barium swallow will be obtained I will plan on consult gastroenterologist in the a.m. To see if there is anything else they can do from their standpoint or whether we need to consider Peg tube.   2. Urine tract infection. History of pseudomonas urine infection in the recent past. - Continue meropenem  Placed on oxybutynin.   3. Acute metabolic encephalopathy in the setting of dementia. Will continue neuro checks per unit protocol, this am patient with no agitation, PT consulted.  4. Dehydration. Has responded well to IV fluids, will decrease rate of isotonic saline.      DVT prophylaxis: scd   Code Status:  full Family Communication: no family at the bedside Disposition Plan: We will consult GI next a.m.   Consultants:   Urology   Procedures:     Antimicrobials:   Fluconazole  Meropenem.     Subjective: Patient still having poor oral intake  Objective: Vitals:   03/25/18 0410 03/25/18 1350 03/25/18 2212 03/26/18 0554  BP: 119/73 131/80 127/74 126/82  Pulse: 66 73 71 78  Resp: 15 16 20 20   Temp: 98.3 F (36.8 C) 98.8 F (37.1 C) 98.3 F (36.8 C) 98.1 F (36.7 C)  TempSrc: Oral Oral Oral Oral  SpO2: 97% 99% 100% 100%  Weight:      Height:        Intake/Output Summary (Last 24 hours) at 03/26/2018 1617 Last data filed at 03/26/2018 1500 Gross per 24 hour  Intake 690 ml  Output 950 ml  Net -260 ml   Filed Weights   03/22/18 2304 03/23/18 0003 03/24/18  1610  Weight: 74.9 kg (165 lb 2 oz) 74.9 kg (165 lb 2 oz) 74.8 kg (165 lb)    Examination:   General: Pt in nad, alert and awake Neurology: Awake and alert, non focal  E ENT: mild pallor, no icterus, oral mucosa moist Cardiovascular: No JVD. S1-S2 present, rhythmic, no gallops, rubs, or murmurs.    Pulmonary: equal chest rise, adequate air movement, no wheezing, rhonchi or rales. Gastrointestinal. Abdomen with no organomegaly, non tender, no rebound or guarding Skin. No rashes Musculoskeletal: no joint deformities  Data Reviewed: I have personally reviewed following labs and imaging studies  CBC: Recent Labs  Lab 03/22/18 1606 03/23/18 0524 03/24/18 0541 03/25/18 0530  WBC 15.3* 9.3 6.6 6.2  NEUTROABS 13.1*  --  4.8 4.4  HGB 14.5 11.8* 11.6* 11.0*  HCT 44.3 36.7* 36.6* 34.8*  MCV 94.1 95.1 95.3 94.8  PLT 216 184 158 960   Basic Metabolic Panel: Recent Labs  Lab 03/22/18 1606 03/23/18 0524 03/24/18 0541 03/25/18 0530  NA 136 137 138 139  K 3.9 3.8 3.9 3.9  CL 96* 102 105 103  CO2 28 26 26 28   GLUCOSE 135* 109* 109* 105*  BUN 13 11 11 12   CREATININE 0.86 0.71 0.64 0.58*  CALCIUM 9.6 8.3* 7.9* 7.9*   GFR: Estimated Creatinine Clearance: 53.9 mL/min (A) (by C-G formula based on SCr of 0.58 mg/dL (L)). Liver Function Tests: Recent Labs  Lab 03/22/18 1606  AST 15  ALT 11*  ALKPHOS 47  BILITOT 1.3*  PROT 7.3  ALBUMIN 3.8   Recent Labs  Lab 03/22/18 1606  LIPASE 27   No results for input(s): AMMONIA in the last 168 hours. Coagulation Profile: Recent Labs  Lab 03/22/18 1606  INR 1.07   Cardiac Enzymes: No results for input(s): CKTOTAL, CKMB, CKMBINDEX, TROPONINI in the last 168 hours. BNP (last 3 results) No results for input(s): PROBNP in the last 8760 hours. HbA1C: No results for input(s): HGBA1C in the last 72 hours. CBG: Recent Labs  Lab 03/25/18 1740 03/25/18 2210 03/26/18 0044 03/26/18 0359 03/26/18 0737  GLUCAP 95 96 81 87 93   Lipid Profile: No results for input(s): CHOL, HDL, LDLCALC, TRIG, CHOLHDL, LDLDIRECT in the last 72 hours. Thyroid Function Tests: No results for input(s): TSH, T4TOTAL, FREET4, T3FREE, THYROIDAB in the last 72 hours. Anemia Panel: No results for input(s): VITAMINB12, FOLATE, FERRITIN, TIBC, IRON,  RETICCTPCT in the last 72 hours.    Radiology Studies: I have reviewed all of the imaging during this hospital visit personally     Scheduled Meds: . enoxaparin (LOVENOX) injection  40 mg Subcutaneous QHS  . mouth rinse  15 mL Mouth Rinse BID  . oxybutynin  5 mg Oral BID   Continuous Infusions: . famotidine (PEPCID) IV Stopped (03/26/18 1322)  . fluconazole (DIFLUCAN) IV 100 mg (03/26/18 1523)  . meropenem (MERREM) IV Stopped (03/26/18 1359)     LOS: 4 days        Velvet Bathe, MD [facility] Pager [phone]

## 2018-03-26 NOTE — Progress Notes (Signed)
Patient more confused and trying to get out of bed,  moved closer to the nurses station to 1304. [name] notified and stated he would come in if needed. Warren [name]

## 2018-03-26 NOTE — Progress Notes (Signed)
PHARMACY NOTE - Meropenem  Pharmacy has been assisting with dosing of Meropenem for UTI.  Day #4/7 abx.  Dosage remains stable at Meropenem 1gm IV Q8h and need for further dosage adjustment appears unlikely at present.   Noted plans to discharge on oral Cipro, Diflucan for 7 days once patient can tolerate orals.   Will sign off at this time.  Please reconsult if a change in clinical status warrants re-evaluation of dosage.  Netta Cedars, PharmD, BCPS 03/26/2018@9 :36 AM

## 2018-03-26 NOTE — Progress Notes (Signed)
Plan for MBS at 11 am.  Thanks. Luanna Salk, [facility] SLP [phone]

## 2018-03-26 NOTE — Progress Notes (Signed)
MBS completed, full report to follow.  Pt with functional oropharyngeal swallow without aspiration or penetration.  He does piecemeal significantly with liquids which SLP suspects is compensatory for esophageal deficits.  Upon esophageal sweep, pt appeared with residuals without adequate sensation.  Various liquid consistencies did not appear to clear esophagus better.  Advised him to consume small frequent amounts of intake pending outpt esophageal work-up.  Pt states "I don't know what they will do about it, I'm 84 years old".      Luanna Salk, [place] [facility] SLP [phone]

## 2018-03-26 NOTE — Progress Notes (Signed)
Patient not medically stable for discharge to SNF. SW will continue to follow for disposition needs.  [employer] updated.   Kathrin Greathouse, Latanya Presser, MSW Clinical Social Worker  [phone] 03/26/2018  4:44 PM

## 2018-03-27 LAB — CULTURE, BLOOD (ROUTINE X 2)
CULTURE: NO GROWTH
CULTURE: NO GROWTH
SPECIAL REQUESTS: ADEQUATE

## 2018-03-27 MED ORDER — KCL IN DEXTROSE-NACL 20-5-0.45 MEQ/L-%-% IV SOLN
INTRAVENOUS | Status: DC
  Administered 2018-03-27 – 2018-03-29 (×3): via INTRAVENOUS
  Administered 2018-03-29: 1000 mL via INTRAVENOUS
  Administered 2018-03-30: 09:00:00 via INTRAVENOUS
  Filled 2018-03-27 (×6): qty 1000

## 2018-03-27 NOTE — Progress Notes (Addendum)
[facility] Progress Note  CC:  Dysphagia  Subjective:  Asked to return to see the patient regarding vomiting/spitting up food within minutes of eating.  Also, constantly spitting up saliva.  Speech pathology saw the patient and MBSS performed as follows:  "MBS completed, full report to follow.  Pt with functional oropharyngeal swallow without aspiration or penetration.  He does piecemeal significantly with liquids which SLP suspects is compensatory for esophageal deficits.  Upon esophageal sweep, pt appeared with residuals without adequate sensation.  Various liquid consistencies did not appear to clear esophagus better.  Advised him to consume small frequent amounts of intake pending outpt esophageal work-up.  Pt states 'I don't know what they will do about it, I'm 84 years old'".   Objective:  Vital signs in last 24 hours: Temp:  [97.8 F (36.6 C)-99.4 F (37.4 C)] 97.8 F (36.6 C) (05/25 0629) Pulse Rate:  [69-88] 69 (05/25 0629) Resp:  [19-21] 21 (05/25 0629) BP: (122-133)/(73-85) 122/77 (05/25 0629) SpO2:  [94 %-98 %] 95 % (05/25 0629) Last BM Date: 03/26/18 General:  Alert, elderly and frail, in NAD Heart:  Regular rate and rhythm; no murmurs Pulm:  CTAB.  No increased WOB. Abdomen:  Soft, non-distended.  BS present.  Non-tender. Extremities:  Without edema. Neurologic:  Alert and oriented x 4;  grossly normal neurologically. Psych:  Alert and cooperative. Normal mood and affect.  Intake/Output from previous day: 05/24 0701 - 05/25 0700 In: 780 [P.O.:330; IV Piggyback:450] Out: 1475 [Urine:1475]  Lab Results: Recent Labs    03/25/18 0530  WBC 6.2  HGB 11.0*  HCT 34.8*  PLT 167   BMET Recent Labs    03/25/18 0530  NA 139  K 3.9  CL 103  CO2 28  GLUCOSE 105*  BUN 12  CREATININE 0.58*  CALCIUM 7.9*   Dg Swallowing Func-speech Pathology  Result Date: 03/26/2018 Objective Swallowing Evaluation: No data recorded Patient Details Name:  JUNIE ENGRAM MRN: [id number] Date of Birth: 11/05/1921 Today's Date: 03/26/2018 Time: SLP Start Time (ACUTE ONLY): 1145 -SLP Stop Time (ACUTE ONLY): 1159 SLP Time Calculation (min) (ACUTE ONLY): 14 min Past Medical History: Past Medical History: Diagnosis Date . CAD (coronary artery disease)   moderate by catheterization,11/08...essentially unchanged by repeat study,10/11 . Carotid artery disease (HCC)    less than 50% stenosis bilaterally March 2013  . Claudication ([place])    ABIs within ABIs within normal limits, 40% left iliac disease by catheterization  . Colon polyps  . Depression  . Dizzy  . Edema extremities    secondary to severe varicose veins. Nonpitting edema and dermatitis.  . Emphysema  . Glaucoma  . Hyperlipidemia  . IBS (irritable bowel syndrome)  . Mitral regurgitation   mild . Mixed dyslipidemia  . Peripheral edema  . Pneumonia  . Prostate cancer [facility])   s/p radiation treatment/s/p TURP . Shortness of breath   Emphysema . Thyroid nodule    posterior and inferior to the right lobepossibly outside of the gland secondary to parathyroid adenoma in addition multiple bilateral thyroid nodules  Past Surgical History: Past Surgical History: Procedure Laterality Date . BIOPSY  03/24/2018  Procedure: BIOPSY;  Surgeon: Jackquline Denmark, MD;  Location: [facility];  Service: Endoscopy;; . CARDIAC CATHETERIZATION  2008 . CHOLECYSTECTOMY   . CYSTOSCOPY N/A 02/27/2018  Procedure: CYSTOSCOPY;  Surgeon: Bjorn Loser, MD;  Location: [facility];  Service: Urology;  Laterality: N/A; . ESOPHAGOGASTRODUODENOSCOPY (EGD) WITH PROPOFOL N/A 03/24/2018  Procedure: ESOPHAGOGASTRODUODENOSCOPY (EGD) WITH  PROPOFOL;  Surgeon: Jackquline Denmark, MD;  Location: [facility];  Service: Endoscopy;  Laterality: N/A; . INCONTINENCE SURGERY    ring inserted . MALONEY DILATION  03/24/2018  Procedure: Venia Minks DILATION;  Surgeon: Jackquline Denmark, MD;  Location: [facility];  Service: Endoscopy;; . TONSILLECTOMY   . TRANSURETHRAL RESECTION OF  PROSTATE    has stress urinary incontinence . URINARY SPHINCTER IMPLANT N/A 02/27/2018  Procedure: REMOVAL INFECTED ARTIFICIAL URINARY SPHINCTER;  Surgeon: Bjorn Loser, MD;  Location: [facility];  Service: Urology;  Laterality: N/A; HPI: 84 year old male who presented with nausea and vomiting.  He does have the significant past medical history for coronary artery disease, emphysema, esophageal stricture, chronic indwelling Foley catheter, anxiety, depression and dementia.   Pt is s/p EGD dilatation completed and recommendation for follow up barium swallow, possible manometry.  Pt had difficulty managing intake yesterday demonstrated by poor intake and regurgitation.  SLP was ordered and recommended MBS to determine least restrictive diet and possible compensation strategies.   CT Abdomen showed Fluid-filled, dilated esophagus. This could represent esophageal dysmotility or an otherwise occult obstructive lesion. Consider esophagram.  Pt was placed on pudding thick liquids.    Subjective: pt awake in chair Assessment / Plan / Recommendation CHL IP CLINICAL IMPRESSIONS 03/26/2018 Clinical Impression Pt with functional oropharyngeal swallow without aspiration or penetration.  He does piecemeal significantly with liquids which SLP suspects is compensatory for esophageal deficits.  Upon esophageal sweep, pt appeared with residuals without adequate sensation.  Various liquid consistencies did not appear to clear esophagus better.  Advised him to consume small frequent amounts of intake pending outpt esophageal work-up.  Pt also appeared with suspected residuals in esophagus that was not barium - ? if food residuals.  Did not provide barium tablet due to concerns for resdiuals.  Pt states "I don't know what they will do about it, I'm 84 years old".     SLP Visit Diagnosis Dysphagia, unspecified (R13.10) Attention and concentration deficit following -- Frontal lobe and executive function deficit following -- Impact on  safety and function Moderate aspiration risk   CHL IP TREATMENT RECOMMENDATION 03/26/2018 Treatment Recommendations No treatment recommended at this time   No flowsheet data found. CHL IP DIET RECOMMENDATION 03/26/2018 SLP Diet Recommendations Other (Comment) Liquid Administration via Cup;Straw Medication Administration Other (Comment) Compensations Slow rate;Small sips/bites Postural Changes Remain semi-upright after after feeds/meals (Comment);Seated upright at 90 degrees   CHL IP OTHER RECOMMENDATIONS 03/26/2018 Recommended Consults -- Oral Care Recommendations Oral care BID Other Recommendations --   CHL IP FOLLOW UP RECOMMENDATIONS 03/26/2018 Follow up Recommendations None   No flowsheet data found.     CHL IP ORAL PHASE 03/26/2018 Oral Phase Impaired Oral - Pudding Teaspoon -- Oral - Pudding Cup -- Oral - Honey Teaspoon -- Oral - Honey Cup -- Oral - Nectar Teaspoon -- Oral - Nectar Cup Piecemeal swallowing Oral - Nectar Straw -- Oral - Thin Teaspoon WFL Oral - Thin Cup Piecemeal swallowing Oral - Thin Straw Piecemeal swallowing Oral - Puree Piecemeal swallowing Oral - Mech Soft Piecemeal swallowing Oral - Regular -- Oral - Multi-Consistency -- Oral - Pill -- Oral Phase - Comment --  CHL IP PHARYNGEAL PHASE 03/26/2018 Pharyngeal Phase Impaired Pharyngeal- Pudding Teaspoon -- Pharyngeal -- Pharyngeal- Pudding Cup -- Pharyngeal -- Pharyngeal- Honey Teaspoon -- Pharyngeal -- Pharyngeal- Honey Cup -- Pharyngeal -- Pharyngeal- Nectar Teaspoon -- Pharyngeal -- Pharyngeal- Nectar Cup WFL Pharyngeal -- Pharyngeal- Nectar Straw -- Pharyngeal -- Pharyngeal- Thin Teaspoon WFL Pharyngeal -- Pharyngeal- Thin  Cup [facility] Pharyngeal -- Pharyngeal- Thin Straw WFL Pharyngeal -- Pharyngeal- Puree WFL Pharyngeal -- Pharyngeal- Mechanical Soft WFL Pharyngeal -- Pharyngeal- Regular -- Pharyngeal -- Pharyngeal- Multi-consistency -- Pharyngeal -- Pharyngeal- Pill -- Pharyngeal -- Pharyngeal Comment --  CHL IP CERVICAL ESOPHAGEAL PHASE  03/26/2018 Cervical Esophageal Phase Impaired Pudding Teaspoon -- Pudding Cup -- Honey Teaspoon -- Honey Cup -- Nectar Teaspoon -- Nectar Cup -- Nectar Straw -- Thin Teaspoon -- Thin Cup -- Thin Straw -- Puree -- Mechanical Soft -- Regular -- Multi-consistency -- Pill -- Cervical Esophageal Comment appearance of prominent cricopharyngeus - did not impact barium flow Macario Golds 03/26/2018, 12:20 PM Luanna Salk, MS [facility] SLP [phone]              Assessment / Plan: *84 year old male with longstanding history of esophageal dysmotility and remote history of reported esophageal stricture.  EGD on 5/22 showed non-bleeding esophageal ulcer, Barrett's, esophageal stenosis that was dilated, and gastritis.  Biopsies showed atypical squamous mucosa with inflammation and necro-inflammatory debris.  Budding yeast present (is on IV diflucan for UTI).  Continues to have vomiting after eating and spitting up saliva.  ? Achalasia. *UTI:  He's also had a lot of urinary tract issues recently as well with removal of artificial sphincter, etc.  Is currently on meropenem and diflucan.  -Will order barium esophagram and if that shows suspicion for achalasia then may benefit from balloon dilation or botox injection of LES.   LOS: 5 days   Laban Emperor. Zehr  03/27/2018, 10:17 AM   Attending physician's note   I have taken an interval history, reviewed the chart and examined the patient. I agree with the Advanced Practitioner's note, impression and recommendations.   84 year old with a dysphagia, dilated esophagus.  EGD 5/22 showed nonbleeding esophageal ulcer, Barrett's esophagus, status post dilatation which did not help for long.  May have achalasia.  Plan: Proceed with barium swallow tomorrow, may need repeat EGD with balloon dilatation or Botox of LES.  Carmell Austria, MD

## 2018-03-27 NOTE — Progress Notes (Signed)
PROGRESS NOTE    Warren [name]  RSW:[id number] DOB: 01/18/1922 DOA: 03/22/2018 PCP: [name], MD    Brief Narrative:  84 year old male who presented with nausea and vomiting.  He does have the significant past medical history for coronary artery disease, emphysema, esophageal stricture, chronic indwelling Foley catheter, anxiety, depression and dementia.  Recent hospitalization for sepsis due to Pseudomonas urinary tract infection.  Patient reported 2 days of abdominal pain, associated with intractable nausea and vomiting, complicated by dyspnea, lethargy, generalized weakness and confusion.  On the initial physical examination blood pressure 135/90, heart rate 92, respiratory 23, oxygen saturation 100%.  Moist mucous membranes, lungs clear to auscultation bilaterally, heart S1-S2 present rhythmic, abdomen soft nontender, no lower extremity edema.  Sodium 136, potassium 3.9, chloride 96, bicarb 28, glucose 135, BUN 13, creatinine 0.86, white count 15.3, hemoglobin 14.5, hematocrit 44.3, platelets 216.  Urinalysis 30 protein, specific gravity 1.015, red cells 11-20, white cells 21-50.  Chest x-ray with low lung volumes, right base atelectasis.  CT chest with fluid-filled, dilated esophagus.  EKG sinus rhythm, left axis deviation, normal intervals.  Patient was admitted to the hospital with the working diagnosis of intractable nausea and vomiting due to esophageal dysmotility.  Assessment & Plan:   Principal Problem:   Nausea and vomiting Active Problems:   Dyslipidemia   Anxiety and depression   Esophageal motility disorder   Urinary tract infection associated with catheterization of urinary tract, initial encounter (HCC)   Leukocytosis   Dementia   Dehydration   Acute metabolic encephalopathy   1. Intractable nausea and vomiting due to esophageal dysmotility.  -Pt is s/p EGD and modified barium swallow. He is still having dysphagia and unable to keep food or water down. I  have reconsulted GI for further evaluation and recommendations. At this point if patient is unable to keep food or liquids down he poses a real risk of deterioration as such we would have to consider PEG tube insertion or comfort care measures if no improvement in swallowing condition.   2. Urine tract infection. History of pseudomonas urine infection in the recent past. - Continue meropenem day 6/7.  Placed on oxybutynin. Unable to swallow pills as such will continue IV antibiotics  3. Acute metabolic encephalopathy in the setting of dementia.  - improving and suspected to be at baseline  4. Dehydration.  - due to inability to swallow. Place on IVF's  DVT prophylaxis: scd   Code Status:  full Family Communication: no family at the bedside Disposition Plan: pending consult report   Consultants:   Urology   GI  Procedures:     Antimicrobials:   Fluconazole  Meropenem.     Subjective: Poor oral intake to fluid and solids  Objective: Vitals:   03/26/18 0554 03/26/18 1409 03/26/18 2229 03/27/18 0629  BP: 126/82 133/85 127/73 122/77  Pulse: 78 88 75 69  Resp: 20 19 19  (!) 21  Temp: 98.1 F (36.7 C) 98 F (36.7 C) 99.4 F (37.4 C) 97.8 F (36.6 C)  TempSrc: Oral Oral Oral Oral  SpO2: 100% 98% 94% 95%  Weight:      Height:        Intake/Output Summary (Last 24 hours) at 03/27/2018 1012 Last data filed at 03/27/2018 0938 Gross per 24 hour  Intake 780 ml  Output 1400 ml  Net -620 ml   Filed Weights   03/22/18 2304 03/23/18 0003 03/24/18 0849  Weight: 74.9 kg (165 lb 2 oz) 74.9 kg (165  lb 2 oz) 74.8 kg (165 lb)    Examination:   General: Pt in nad, alert and awake Neurology: Awake and alert, non focal  E ENT: mild pallor, no icterus, oral mucosa moist Cardiovascular: No JVD. S1-S2 present, rhythmic, no gallops, rubs, or murmurs.  Pulmonary: equal chest rise, adequate air movement, no wheezing, rhonchi or rales. Gastrointestinal. Abdomen with no  organomegaly, non tender, no rebound or guarding Skin. No rashes, on limited exam. Musculoskeletal: no joint deformities  Data Reviewed: I have personally reviewed following labs and imaging studies  CBC: Recent Labs  Lab 03/22/18 1606 03/23/18 0524 03/24/18 0541 03/25/18 0530  WBC 15.3* 9.3 6.6 6.2  NEUTROABS 13.1*  --  4.8 4.4  HGB 14.5 11.8* 11.6* 11.0*  HCT 44.3 36.7* 36.6* 34.8*  MCV 94.1 95.1 95.3 94.8  PLT 216 184 158 382   Basic Metabolic Panel: Recent Labs  Lab 03/22/18 1606 03/23/18 0524 03/24/18 0541 03/25/18 0530  NA 136 137 138 139  K 3.9 3.8 3.9 3.9  CL 96* 102 105 103  CO2 28 26 26 28   GLUCOSE 135* 109* 109* 105*  BUN 13 11 11 12   CREATININE 0.86 0.71 0.64 0.58*  CALCIUM 9.6 8.3* 7.9* 7.9*   GFR: Estimated Creatinine Clearance: 53.9 mL/min (A) (by C-G formula based on SCr of 0.58 mg/dL (L)). Liver Function Tests: Recent Labs  Lab 03/22/18 1606  AST 15  ALT 11*  ALKPHOS 47  BILITOT 1.3*  PROT 7.3  ALBUMIN 3.8   Recent Labs  Lab 03/22/18 1606  LIPASE 27   No results for input(s): AMMONIA in the last 168 hours. Coagulation Profile: Recent Labs  Lab 03/22/18 1606  INR 1.07   Cardiac Enzymes: No results for input(s): CKTOTAL, CKMB, CKMBINDEX, TROPONINI in the last 168 hours. BNP (last 3 results) No results for input(s): PROBNP in the last 8760 hours. HbA1C: No results for input(s): HGBA1C in the last 72 hours. CBG: Recent Labs  Lab 03/25/18 2210 03/26/18 0044 03/26/18 0359 03/26/18 0737 03/26/18 2226  GLUCAP 96 81 87 93 127*   Lipid Profile: No results for input(s): CHOL, HDL, LDLCALC, TRIG, CHOLHDL, LDLDIRECT in the last 72 hours. Thyroid Function Tests: No results for input(s): TSH, T4TOTAL, FREET4, T3FREE, THYROIDAB in the last 72 hours. Anemia Panel: No results for input(s): VITAMINB12, FOLATE, FERRITIN, TIBC, IRON, RETICCTPCT in the last 72 hours.    Radiology Studies: I have reviewed all of the imaging during this  hospital visit personally     Scheduled Meds: . enoxaparin (LOVENOX) injection  40 mg Subcutaneous QHS  . mouth rinse  15 mL Mouth Rinse BID  . oxybutynin  5 mg Oral BID   Continuous Infusions: . dextrose 5 % and 0.45 % NaCl with KCl 20 mEq/L    . famotidine (PEPCID) IV 20 mg (03/27/18 1007)  . fluconazole (DIFLUCAN) IV Stopped (03/26/18 1623)  . meropenem (MERREM) IV Stopped (03/27/18 5053)     LOS: 5 days     Velvet Bathe, MD [facility] Pager [phone]

## 2018-03-28 ENCOUNTER — Inpatient Hospital Stay (HOSPITAL_COMMUNITY): Payer: Medicare Other

## 2018-03-28 MED ORDER — KETOROLAC TROMETHAMINE 15 MG/ML IJ SOLN
7.5000 mg | Freq: Once | INTRAMUSCULAR | Status: AC | PRN
Start: 2018-03-28 — End: 2018-03-28

## 2018-03-28 NOTE — Progress Notes (Signed)
Barium swallow reviewed  FINDINGS: No evidence of mucosal lesion. Mild esophageal tortuosity in its proximal and mid segment. Esophagus distends to normal caliber throughout. No focal stricture. No extravasation. Poor primary peristaltic contractions and slow incomplete clearance of contrast from the mid esophagus. Reflux maneuvers were not attempted.  IMPRESSION: Presbyesophagus without stricture.  Impression: 84 year old male with longstanding history of esophageal dysmotility and remote history of reported esophageal stricture. EGD on 5/22 showed non-bleeding esophageal ulcer, Barrett's, S/P esophageal dilation and gastritis.  Biopsies showed atypical squamous mucosa with inflammation and necro-inflammatory debris.  Budding yeast present (is on IV diflucan for UTI).  Continued to have problems with dysphagia.  Barium swallow showing Presbyesophagus without any definite stricture.  Plan: Feed in sitting position Follow recommendations of speech therapy Chop all meats If still with problems, consider esophageal manometry.  I do not think he will be able to tolerate it. Follow-up with the GI clinic if still with problems.  Then can try calcium channel blockers/empiric Botox injections but may not help. Avoid medications like Fosamax. Will sign off for now.

## 2018-03-28 NOTE — Progress Notes (Signed)
PROGRESS NOTE    Warren [name]  YJE:[id number] DOB: 31-Jan-1922 DOA: 03/22/2018 PCP: [name], MD    Brief Narrative:  84 year old male who presented with nausea and vomiting.  He does have the significant past medical history for coronary artery disease, emphysema, esophageal stricture, chronic indwelling Foley catheter, anxiety, depression and dementia.  Recent hospitalization for sepsis due to Pseudomonas urinary tract infection.  Patient reported 2 days of abdominal pain, associated with intractable nausea and vomiting, complicated by dyspnea, lethargy, generalized weakness and confusion.  On the initial physical examination blood pressure 135/90, heart rate 92, respiratory 23, oxygen saturation 100%.  Moist mucous membranes, lungs clear to auscultation bilaterally, heart S1-S2 present rhythmic, abdomen soft nontender, no lower extremity edema.  Sodium 136, potassium 3.9, chloride 96, bicarb 28, glucose 135, BUN 13, creatinine 0.86, white count 15.3, hemoglobin 14.5, hematocrit 44.3, platelets 216.  Urinalysis 30 protein, specific gravity 1.015, red cells 11-20, white cells 21-50.  Chest x-ray with low lung volumes, right base atelectasis.  CT chest with fluid-filled, dilated esophagus.  EKG sinus rhythm, left axis deviation, normal intervals.  Patient was admitted to the hospital with the working diagnosis of intractable nausea and vomiting due to esophageal dysmotility.  Assessment & Plan:   Principal Problem:   Nausea and vomiting Active Problems:   Dyslipidemia   Anxiety and depression   Esophageal dysfunction   Urinary tract infection associated with catheterization of urinary tract, initial encounter (HCC)   Leukocytosis   Dementia   Dehydration   Acute metabolic encephalopathy   1. Intractable nausea and vomiting due to esophageal dysmotility.  -Pt is s/p EGD and modified barium swallow. He is still having dysphagia and unable to keep food or water down. I have  reconsulted GI for further evaluation and recommendations. Plan is for further evaluation by GI to see if there are treatment options for patient (dilation vs botox injection)  2. Urine tract infection. History of pseudomonas urine infection in the recent past. - Continue meropenem day 7/7.  Placed on oxybutynin. Unable to swallow pills as such will continue IV antibiotics  3. Acute metabolic encephalopathy in the setting of dementia.  - improving and suspected to be at baseline  4. Dehydration.  - due to inability to swallow. Place on IVF's  DVT prophylaxis: scd   Code Status:  full Family Communication: d/c with son Disposition Plan: pending final recommendations by GI specialist   Consultants:   Urology   GI  Procedures:     Antimicrobials:   Fluconazole  Meropenem.     Subjective: Pt has no new complaints, no acute issues overnight.   Objective: Vitals:   03/27/18 0629 03/27/18 1420 03/27/18 2028 03/28/18 0425  BP: 122/77 (!) 151/79 (!) 151/85 137/78  Pulse: 69 81 87 84  Resp: (!) 21 14 16 15   Temp: 97.8 F (36.6 C) 98.3 F (36.8 C) 98.4 F (36.9 C) 98.3 F (36.8 C)  TempSrc: Oral Oral Oral Oral  SpO2: 95% 98% 96% 96%  Weight:      Height:        Intake/Output Summary (Last 24 hours) at 03/28/2018 1424 Last data filed at 03/28/2018 0600 Gross per 24 hour  Intake 1741.25 ml  Output 2400 ml  Net -658.75 ml   Filed Weights   03/22/18 2304 03/23/18 0003 03/24/18 0849  Weight: 74.9 kg (165 lb 2 oz) 74.9 kg (165 lb 2 oz) 74.8 kg (165 lb)    Examination:   General: awake and  alert, in nad. Neurology: Awake and alert, non focal   E ENT: mild pallor, no icterus, oral mucosa moist Cardiovascular: No JVD. S1-S2 WNL, rhythmic, no gallops, rubs, or murmurs.  Pulmonary: equal chest rise, adequate air movement, no wheezing, rhonchi or rales. Gastrointestinal. Abdomen with no organomegaly, non tender, no rebound or guarding Skin. No rashes, on limited  exam. Musculoskeletal: no joint deformities  Data Reviewed: I have personally reviewed following labs and imaging studies  CBC: Recent Labs  Lab 03/22/18 1606 03/23/18 0524 03/24/18 0541 03/25/18 0530  WBC 15.3* 9.3 6.6 6.2  NEUTROABS 13.1*  --  4.8 4.4  HGB 14.5 11.8* 11.6* 11.0*  HCT 44.3 36.7* 36.6* 34.8*  MCV 94.1 95.1 95.3 94.8  PLT 216 184 158 734   Basic Metabolic Panel: Recent Labs  Lab 03/22/18 1606 03/23/18 0524 03/24/18 0541 03/25/18 0530  NA 136 137 138 139  K 3.9 3.8 3.9 3.9  CL 96* 102 105 103  CO2 28 26 26 28   GLUCOSE 135* 109* 109* 105*  BUN 13 11 11 12   CREATININE 0.86 0.71 0.64 0.58*  CALCIUM 9.6 8.3* 7.9* 7.9*   GFR: Estimated Creatinine Clearance: 53.9 mL/min (A) (by C-G formula based on SCr of 0.58 mg/dL (L)). Liver Function Tests: Recent Labs  Lab 03/22/18 1606  AST 15  ALT 11*  ALKPHOS 47  BILITOT 1.3*  PROT 7.3  ALBUMIN 3.8   Recent Labs  Lab 03/22/18 1606  LIPASE 27   No results for input(s): AMMONIA in the last 168 hours. Coagulation Profile: Recent Labs  Lab 03/22/18 1606  INR 1.07   Cardiac Enzymes: No results for input(s): CKTOTAL, CKMB, CKMBINDEX, TROPONINI in the last 168 hours. BNP (last 3 results) No results for input(s): PROBNP in the last 8760 hours. HbA1C: No results for input(s): HGBA1C in the last 72 hours. CBG: Recent Labs  Lab 03/25/18 2210 03/26/18 0044 03/26/18 0359 03/26/18 0737 03/26/18 2226  GLUCAP 96 81 87 93 127*   Lipid Profile: No results for input(s): CHOL, HDL, LDLCALC, TRIG, CHOLHDL, LDLDIRECT in the last 72 hours. Thyroid Function Tests: No results for input(s): TSH, T4TOTAL, FREET4, T3FREE, THYROIDAB in the last 72 hours. Anemia Panel: No results for input(s): VITAMINB12, FOLATE, FERRITIN, TIBC, IRON, RETICCTPCT in the last 72 hours.    Radiology Studies: I have reviewed all of the imaging during this hospital visit personally     Scheduled Meds: . enoxaparin (LOVENOX)  injection  40 mg Subcutaneous QHS  . mouth rinse  15 mL Mouth Rinse BID  . oxybutynin  5 mg Oral BID   Continuous Infusions: . dextrose 5 % and 0.45 % NaCl with KCl 20 mEq/L 100 mL/hr at 03/28/18 1356  . famotidine (PEPCID) IV Stopped (03/27/18 2304)  . fluconazole (DIFLUCAN) IV 100 mg (03/28/18 1356)  . meropenem (MERREM) IV Stopped (03/28/18 1937)     LOS: 6 days     Velvet Bathe, MD [facility] Pager [phone]

## 2018-03-29 NOTE — Progress Notes (Signed)
PROGRESS NOTE    Warren [name]  JQB:[id number] DOB: 08/20/1922 DOA: 03/22/2018 PCP: [name], MD    Brief Narrative:  84 year old male who presented with nausea and vomiting.  He does have the significant past medical history for coronary artery disease, emphysema, esophageal stricture, chronic indwelling Foley catheter, anxiety, depression and dementia.  Recent hospitalization for sepsis due to Pseudomonas urinary tract infection.  Patient reported 2 days of abdominal pain, associated with intractable nausea and vomiting, complicated by dyspnea, lethargy, generalized weakness and confusion.  On the initial physical examination blood pressure 135/90, heart rate 92, respiratory 23, oxygen saturation 100%.  Moist mucous membranes, lungs clear to auscultation bilaterally, heart S1-S2 present rhythmic, abdomen soft nontender, no lower extremity edema.  Sodium 136, potassium 3.9, chloride 96, bicarb 28, glucose 135, BUN 13, creatinine 0.86, white count 15.3, hemoglobin 14.5, hematocrit 44.3, platelets 216.  Urinalysis 30 protein, specific gravity 1.015, red cells 11-20, white cells 21-50.  Chest x-ray with low lung volumes, right base atelectasis.  CT chest with fluid-filled, dilated esophagus.  EKG sinus rhythm, left axis deviation, normal intervals.  Patient was admitted to the hospital with the working diagnosis of intractable nausea and vomiting due to esophageal dysmotility.  Assessment & Plan:   Principal Problem:   Nausea and vomiting Active Problems:   Dyslipidemia   Anxiety and depression   Esophageal dysfunction   Urinary tract infection associated with catheterization of urinary tract, initial encounter (HCC)   Leukocytosis   Dementia   Dehydration   Acute metabolic encephalopathy   1. Intractable nausea and vomiting due to esophageal dysmotility.  -Pt is s/p EGD and modified barium swallow. He is still having dysphagia and unable to keep food or water down. I have  reconsulted GI for further evaluation. After their evaluation they have recommended the following:  Plan: Feed in sitting position Follow recommendations of speech therapy Chop all meats If still with problems, consider esophageal manometry.  I do not think he will be able to tolerate it. Follow-up with the GI clinic if still with problems.  Then can try calcium channel blockers/empiric Botox injections but may not help. Avoid medications like Fosamax.  Plan will be to monitor for today if patient able to consistently keep food down we will plan on discharging next a.m.  2. Urine tract infection. History of pseudomonas urine infection in the recent past. - Continue meropenem day 7/7.  Placed on oxybutynin. Unable to swallow pills as such will continue IV antibiotics  3. Acute metabolic encephalopathy in the setting of dementia.  - improving and suspected to be at baseline  4. Dehydration.  - due to inability to swallow. Place on IVF's  5. Candidal esophagitis - continue diflucan day 6/7  DVT prophylaxis: scd   Code Status:  full Family Communication: no family at bedside. Disposition Plan: pending final recommendations by GI specialist   Consultants:   Urology   GI  Procedures:     Antimicrobials:   Fluconazole  Meropenem.     Subjective: No new complaints reported. Nurse reports patient was able to keep breakfast down.  Objective: Vitals:   03/28/18 0425 03/28/18 1433 03/28/18 2121 03/29/18 0453  BP: 137/78 121/87 (!) 144/89 (!) 151/93  Pulse: 84 87 86 79  Resp: 15 16 16 16   Temp: 98.3 F (36.8 C) 97.8 F (36.6 C) (!) 97.5 F (36.4 C) 97.7 F (36.5 C)  TempSrc: Oral Oral Oral Oral  SpO2: 96% 100% 97% 95%  Weight:  Height:        Intake/Output Summary (Last 24 hours) at 03/29/2018 1301 Last data filed at 03/29/2018 0940 Gross per 24 hour  Intake 1256.67 ml  Output 2800 ml  Net -1543.33 ml   Filed Weights   03/22/18 2304 03/23/18 0003  03/24/18 0849  Weight: 74.9 kg (165 lb 2 oz) 74.9 kg (165 lb 2 oz) 74.8 kg (165 lb)    Examination: Exam unchanged when compared to prior 03/28/2018  General: awake and alert, in nad. Neurology: Awake and alert, non focal   E ENT: mild pallor, no icterus, oral mucosa moist Cardiovascular: No JVD. S1-S2 WNL, rhythmic, no gallops, rubs, or murmurs.  Pulmonary: equal chest rise, adequate air movement, no wheezing, rhonchi or rales. Gastrointestinal. Abdomen with no organomegaly, non tender, no rebound or guarding Skin. No rashes, on limited exam. Musculoskeletal: no joint deformities  Data Reviewed: I have personally reviewed following labs and imaging studies  CBC: Recent Labs  Lab 03/22/18 1606 03/23/18 0524 03/24/18 0541 03/25/18 0530  WBC 15.3* 9.3 6.6 6.2  NEUTROABS 13.1*  --  4.8 4.4  HGB 14.5 11.8* 11.6* 11.0*  HCT 44.3 36.7* 36.6* 34.8*  MCV 94.1 95.1 95.3 94.8  PLT 216 184 158 638   Basic Metabolic Panel: Recent Labs  Lab 03/22/18 1606 03/23/18 0524 03/24/18 0541 03/25/18 0530  NA 136 137 138 139  K 3.9 3.8 3.9 3.9  CL 96* 102 105 103  CO2 28 26 26 28   GLUCOSE 135* 109* 109* 105*  BUN 13 11 11 12   CREATININE 0.86 0.71 0.64 0.58*  CALCIUM 9.6 8.3* 7.9* 7.9*   GFR: Estimated Creatinine Clearance: 53.9 mL/min (A) (by C-G formula based on SCr of 0.58 mg/dL (L)). Liver Function Tests: Recent Labs  Lab 03/22/18 1606  AST 15  ALT 11*  ALKPHOS 47  BILITOT 1.3*  PROT 7.3  ALBUMIN 3.8   Recent Labs  Lab 03/22/18 1606  LIPASE 27   No results for input(s): AMMONIA in the last 168 hours. Coagulation Profile: Recent Labs  Lab 03/22/18 1606  INR 1.07   Cardiac Enzymes: No results for input(s): CKTOTAL, CKMB, CKMBINDEX, TROPONINI in the last 168 hours. BNP (last 3 results) No results for input(s): PROBNP in the last 8760 hours. HbA1C: No results for input(s): HGBA1C in the last 72 hours. CBG: Recent Labs  Lab 03/25/18 2210 03/26/18 0044  03/26/18 0359 03/26/18 0737 03/26/18 2226  GLUCAP 96 81 87 93 127*   Lipid Profile: No results for input(s): CHOL, HDL, LDLCALC, TRIG, CHOLHDL, LDLDIRECT in the last 72 hours. Thyroid Function Tests: No results for input(s): TSH, T4TOTAL, FREET4, T3FREE, THYROIDAB in the last 72 hours. Anemia Panel: No results for input(s): VITAMINB12, FOLATE, FERRITIN, TIBC, IRON, RETICCTPCT in the last 72 hours.    Radiology Studies: I have reviewed all of the imaging during this hospital visit personally     Scheduled Meds: . enoxaparin (LOVENOX) injection  40 mg Subcutaneous QHS  . mouth rinse  15 mL Mouth Rinse BID  . oxybutynin  5 mg Oral BID   Continuous Infusions: . dextrose 5 % and 0.45 % NaCl with KCl 20 mEq/L 100 mL/hr at 03/29/18 0451  . famotidine (PEPCID) IV Stopped (03/29/18 0940)  . fluconazole (DIFLUCAN) IV Stopped (03/29/18 1109)  . meropenem (MERREM) IV Stopped (03/29/18 7564)     LOS: 7 days     Velvet Bathe, MD [facility] Pager [phone]

## 2018-03-29 NOTE — Progress Notes (Signed)
Dressings changed to right groin area and posterior scrotum area.

## 2018-03-30 DIAGNOSIS — F4320 Adjustment disorder, unspecified: Secondary | ICD-10-CM | POA: Diagnosis not present

## 2018-03-30 DIAGNOSIS — R1314 Dysphagia, pharyngoesophageal phase: Secondary | ICD-10-CM | POA: Diagnosis not present

## 2018-03-30 DIAGNOSIS — E860 Dehydration: Secondary | ICD-10-CM

## 2018-03-30 DIAGNOSIS — F0390 Unspecified dementia without behavioral disturbance: Secondary | ICD-10-CM | POA: Diagnosis not present

## 2018-03-30 DIAGNOSIS — M6281 Muscle weakness (generalized): Secondary | ICD-10-CM | POA: Diagnosis not present

## 2018-03-30 DIAGNOSIS — Z743 Need for continuous supervision: Secondary | ICD-10-CM | POA: Diagnosis not present

## 2018-03-30 DIAGNOSIS — R279 Unspecified lack of coordination: Secondary | ICD-10-CM | POA: Diagnosis not present

## 2018-03-30 DIAGNOSIS — R2681 Unsteadiness on feet: Secondary | ICD-10-CM | POA: Diagnosis not present

## 2018-03-30 DIAGNOSIS — E785 Hyperlipidemia, unspecified: Secondary | ICD-10-CM | POA: Diagnosis not present

## 2018-03-30 DIAGNOSIS — I10 Essential (primary) hypertension: Secondary | ICD-10-CM | POA: Diagnosis not present

## 2018-03-30 DIAGNOSIS — R112 Nausea with vomiting, unspecified: Secondary | ICD-10-CM | POA: Diagnosis not present

## 2018-03-30 DIAGNOSIS — I2510 Atherosclerotic heart disease of native coronary artery without angina pectoris: Secondary | ICD-10-CM | POA: Diagnosis not present

## 2018-03-30 DIAGNOSIS — F419 Anxiety disorder, unspecified: Secondary | ICD-10-CM | POA: Diagnosis not present

## 2018-03-30 DIAGNOSIS — K224 Dyskinesia of esophagus: Secondary | ICD-10-CM | POA: Diagnosis not present

## 2018-03-30 DIAGNOSIS — B965 Pseudomonas (aeruginosa) (mallei) (pseudomallei) as the cause of diseases classified elsewhere: Secondary | ICD-10-CM | POA: Diagnosis not present

## 2018-03-30 DIAGNOSIS — K22719 Barrett's esophagus with dysplasia, unspecified: Secondary | ICD-10-CM | POA: Diagnosis not present

## 2018-03-30 DIAGNOSIS — T83591D Infection and inflammatory reaction due to implanted urinary sphincter, subsequent encounter: Secondary | ICD-10-CM | POA: Diagnosis not present

## 2018-03-30 DIAGNOSIS — G9341 Metabolic encephalopathy: Secondary | ICD-10-CM | POA: Diagnosis not present

## 2018-03-30 DIAGNOSIS — N390 Urinary tract infection, site not specified: Secondary | ICD-10-CM | POA: Diagnosis not present

## 2018-03-30 DIAGNOSIS — B3781 Candidal esophagitis: Secondary | ICD-10-CM | POA: Diagnosis not present

## 2018-03-30 DIAGNOSIS — N3942 Incontinence without sensory awareness: Secondary | ICD-10-CM | POA: Diagnosis not present

## 2018-03-30 DIAGNOSIS — J439 Emphysema, unspecified: Secondary | ICD-10-CM | POA: Diagnosis not present

## 2018-03-30 DIAGNOSIS — F329 Major depressive disorder, single episode, unspecified: Secondary | ICD-10-CM | POA: Diagnosis not present

## 2018-03-30 NOTE — Progress Notes (Signed)
Physical Therapy Treatment Patient Details Name: Warren [name] MRN: [id number] DOB: June 26, 1922 Today's Date: 03/30/2018    History of Present Illness -84 year-old male who presented 03/22/18 with nausea and vomiting.  past medical history for coronary artery disease, emphysema, esophageal stricture, chronic indwelling Foley catheter, anxiety, depression and dementia.  Recent hospitalization for sepsis due to Pseudomonas urinary tract infection. DC to SNF.    PT Comments    Assisted to EOB with increased time but required + 2 assist to amb.    Follow Up Recommendations  SNF     Equipment Recommendations  None recommended by PT    Recommendations for Other Services       Precautions / Restrictions Precautions Precautions: Fall Precaution Comments: strict swallow precautions Restrictions Weight Bearing Restrictions: No    Mobility  Bed Mobility Overal bed mobility: Needs Assistance Bed Mobility: Supine to Sit     Supine to sit: Min assist     General bed mobility comments: HOB elevated and use of bed pad to complete scooting to EOB  Transfers Overall transfer level: Needs assistance Equipment used: Rolling walker (2 wheeled) Transfers: Sit to/from [facility] Sit to Stand: Mod assist;+2 physical assistance;+2 safety/equipment         General transfer comment: Assist to rise, stabilize, control descent. VCs safety, hand placement.   Ambulation/Gait Ambulation/Gait assistance: Min assist;Mod assist;+2 safety/equipment Ambulation Distance (Feet): 30 Feet Assistive device: Rolling walker (2 wheeled) Gait Pattern/deviations: Step-through pattern;Trunk flexed Gait velocity: decreased    General Gait Details: Assist to stabilize pt and maneuver with walker. Cues for safety, distance from walker.   Mod R lean   Stairs             Wheelchair Mobility    Modified Rankin (Stroke Patients Only)       Balance                                            Cognition Arousal/Alertness: Awake/alert Behavior During Therapy: WFL for tasks assessed/performed Overall Cognitive Status: History of cognitive impairments - at baseline                                 General Comments: pleasant and follows all instructions       Exercises      General Comments        Pertinent Vitals/Pain Pain Assessment: No/denies pain    Home Living                      Prior Function            PT Goals (current goals can now be found in the care plan section) Progress towards PT goals: Progressing toward goals    Frequency    Min 2X/week      PT Plan Current plan remains appropriate    Co-evaluation              AM-PAC PT "6 Clicks" Daily Activity  Outcome Measure  Difficulty turning over in bed (including adjusting bedclothes, sheets and blankets)?: A Lot Difficulty moving from lying on back to sitting on the side of the bed? : A Lot Difficulty sitting down on and standing up from a chair with arms (e.g., wheelchair, bedside commode, etc,.)?: A Lot Help needed  moving to and from a bed to chair (including a wheelchair)?: A Lot Help needed walking in hospital room?: A Lot Help needed climbing 3-5 steps with a railing? : Total 6 Click Score: 11    End of Session Equipment Utilized During Treatment: Gait belt Activity Tolerance: Patient tolerated treatment well Patient left: in chair;with call bell/phone within reach;with chair alarm set Nurse Communication: Mobility status PT Visit Diagnosis: Muscle weakness (generalized) (M62.81);Difficulty in walking, not elsewhere classified (R26.2)     Time: 3009-2330 PT Time Calculation (min) (ACUTE ONLY): 28 min  Charges:  $Gait Training: 8-22 mins $Therapeutic Activity: 8-22 mins                    G Codes:       Rica Koyanagi  PTA [facility]  Acute  Rehab Pager      [phone]

## 2018-03-30 NOTE — Clinical Social Work Placement (Addendum)
D/C Summary sent.  Nurse given number to call report. [facility] arranged for transport.   CLINICAL SOCIAL WORK PLACEMENT  NOTE  Date:  03/30/2018  Patient Details  Name: Warren [name] MRN: [id number] Date of Birth: 1922-09-21  Clinical Social Work is seeking post-discharge placement for this patient at the [place] level of care (*CSW will initial, date and re-position this form in  chart as items are completed):  Yes   Patient/family provided with [facility] Work Department's list of facilities offering this level of care within the geographic area requested by the patient (or if unable, by the patient's family).  Yes   Patient/family informed of their freedom to choose among providers that offer the needed level of care, that participate in Medicare, Medicaid or managed care program needed by the patient, have an available bed and are willing to accept the patient.      Patient/family informed of [facility] ownership interest in [facility] and [facility], as well as of the fact that they are under no obligation to receive care at these facilities.  PASRR submitted to EDS on       PASRR number received on       Existing PASRR number confirmed on 03/25/18     FL2 transmitted to all facilities in geographic area requested by pt/family on       FL2 transmitted to all facilities within larger geographic area on       Patient informed that his/her managed care company has contracts with or will negotiate with certain facilities, including the following:        Yes   Patient/family informed of bed offers received.  Patient chooses bed at [facility]     Physician recommends and patient chooses bed at      Patient to be transferred to [facility] on 03/30/18.  Patient to be transferred to facility by [facility]     Patient family notified on 03/30/18 of transfer.  Name of family member notified:  [name]      PHYSICIAN       Additional Comment:    _______________________________________________ Lia Hopping, LCSW 03/30/2018, 2:19 PM

## 2018-03-30 NOTE — Discharge Summary (Signed)
Physician Discharge Summary  Warren [name] GGY:[id number] DOB: [birth year] DOA: 03/22/2018  PCP: [name], MD  Admit date: 03/22/2018 Discharge date: 03/30/2018  Time spent: > 35 minutes  Recommendations for Outpatient Follow-up:  1.    Discharge Diagnoses:  Principal Problem:   Nausea and vomiting Active Problems:   Dyslipidemia   Anxiety and depression   Esophageal dysfunction   Urinary tract infection associated with catheterization of urinary tract, initial encounter (HCC)   Leukocytosis   Dementia   Dehydration   Acute metabolic encephalopathy   Discharge Condition: stable  Diet recommendation: soft diet  Filed Weights   03/22/18 2304 03/23/18 0003 03/24/18 0849  Weight: 74.9 kg (165 lb 2 oz) 74.9 kg (165 lb 2 oz) 74.8 kg (165 lb)    History of present illness:  84 year old male who presented with nausea and vomiting.  Was found to have Barrett's esophagus on EGD.   Hospital Course:  1. Intractable nausea and vomiting due to esophageal dysmotility.  -Pt is s/p EGD and modified barium swallow. He is still having dysphagia and unable to keep food or water down. I have reconsulted GI for further evaluation. After their evaluation they have recommended the following:  Plan: Feed in sitting position Follow recommendations of speech therapy Chop all meats If still with problems, consider esophageal manometry. I do not think he will be able to tolerate it. Follow-up with the GI clinic if still with problems.Then can try calcium channel blockers/empiric Botox injections but may not help. Avoid medications like Fosamax.  2. Urine tract infection. History of pseudomonas urine infection in the recent past. - Pt completed course of meropenem with more than 7 days of treatment.  Placed on oxybutynin.   3. Acute metabolic encephalopathy in the setting of dementia.  - improving and suspected to be at baseline  4. Dehydration.  - due to inability to  swallow. Resolved with fluids and improved po intake.  5. Candidal esophagitis - treated with diflucan while in house.  Procedures:  none  Consultations:  GI  Urology  Discharge Exam: Vitals:   03/30/18 0538 03/30/18 1325  BP: 123/72 130/86  Pulse: 81 81  Resp: 16 16  Temp: 98.8 F (37.1 C) 97.8 F (36.6 C)  SpO2: 99% 98%    General: Patient in no acute distress, alert and awake Cardiovascular: Regular rate and rhythm, no murmurs rubs Respiratory: No increased work of breathing, no wheezes  Discharge Instructions   Discharge Instructions    Call MD for:  difficulty breathing, headache or visual disturbances   Complete by:  As directed    Call MD for:  severe uncontrolled pain   Complete by:  As directed    Call MD for:  severe uncontrolled pain   Complete by:  As directed    Call MD for:  temperature >100.4   Complete by:  As directed    Call MD for:  temperature >100.4   Complete by:  As directed    Diet - low sodium heart healthy   Complete by:  As directed    Diet - low sodium heart healthy   Complete by:  As directed    Discharge instructions   Complete by:  As directed    Please have patient f/u with urologist and gastroenterologist after hospital discharge.   Discharge instructions   Complete by:  As directed    Ensure follow up   Increase activity slowly   Complete by:  As directed  Increase activity slowly   Complete by:  As directed      Allergies as of 03/30/2018      Reactions   Aspirin    REACTION: SPOTS REACTION: SPOTS   Latex    unknown   Penicillins    Has patient had a PCN reaction causing immediate rash, facial/tongue/throat swelling, SOB or lightheadedness with hypotension:No Has patient had a PCN reaction causing severe rash involving mucus membranes or skin necrosis: No Has patient had a PCN reaction that required hospitalization:NoHas patient had a PCN reaction occurring within the last 10 years:No If all of the above  answers are "NO", then may proceed with Cephalosporin use.      Medication List    TAKE these medications   alendronate 70 MG tablet Commonly known as:  FOSAMAX Take 70 mg by mouth once a week. Take 70 mg once a week in the morning with a full glass of water on an empty stomach. Do not lay down for 30 minutes   bisacodyl 10 MG suppository Commonly known as:  DULCOLAX Place 10 mg rectally daily as needed for moderate constipation.   CALCIUM 600 600 MG Tabs tablet Generic drug:  calcium carbonate Take 600 mg by mouth 2 (two) times daily.   citalopram 20 MG tablet Commonly known as:  CELEXA Take 20 mg by mouth daily.   LORazepam 1 MG tablet Commonly known as:  ATIVAN Take 1 tablet (1 mg total) by mouth at bedtime.   magnesium hydroxide 400 MG/5ML suspension Commonly known as:  MILK OF MAGNESIA Take 30 mLs by mouth daily as needed for mild constipation.   MYRBETRIQ 50 MG Tb24 tablet Generic drug:  mirabegron ER Take 50 mg by mouth daily.   omeprazole 20 MG tablet Commonly known as:  PRILOSEC OTC Take 1 tablet (20 mg total) by mouth daily.   oxybutynin 5 MG tablet Commonly known as:  DITROPAN Take 1 tablet (5 mg total) by mouth 2 (two) times daily.   pravastatin 40 MG tablet Commonly known as:  PRAVACHOL Take 40 mg by mouth daily. What changed:  Another medication with the same name was removed. Continue taking this medication, and follow the directions you see here.   Vitamin D3 10000 units Tabs Take 2,000 Units by mouth daily.      Allergies  Allergen Reactions  . Aspirin     REACTION: SPOTS REACTION: SPOTS  . Latex     unknown  . Penicillins     Has patient had a PCN reaction causing immediate rash, facial/tongue/throat swelling, SOB or lightheadedness with hypotension:No Has patient had a PCN reaction causing severe rash involving mucus membranes or skin necrosis: No Has patient had a PCN reaction that required hospitalization:NoHas patient had a PCN  reaction occurring within the last 10 years:No If all of the above answers are "NO", then may proceed with Cephalosporin use.    Follow-up Information    Zehr, Laban Emperor, PA-C Follow up on 04/30/2018.   Specialty:  Gastroenterology Why:  11:00 am Contact information: [place] [address] [phone]            The results of significant diagnostics from this hospitalization (including imaging, microbiology, ancillary and laboratory) are listed below for reference.    Significant Diagnostic Studies: Dg Chest 2 View  Result Date: 03/22/2018 CLINICAL DATA:  Difficulty swallowing for the past 24 hours. EXAM: CHEST - 2 VIEW COMPARISON:  03/02/2018 FINDINGS: The cardiac silhouette, mediastinal and hilar contours  are stable. There is tortuosity and calcification of the thoracic aorta. Low lung volumes with vascular crowding and streaky bibasilar atelectasis. No definite pleural effusions. The bony thorax is grossly intact. IMPRESSION: Low lung volumes with vascular crowding and basilar atelectasis. No definite infiltrates or effusions. Electronically Signed   By: Marijo Sanes M.D.   On: 03/22/2018 17:08   Dg Chest 2 View  Result Date: 03/02/2018 CLINICAL DATA:  Cough and chest congestion for the past 3-4 days. EXAM: CHEST - 2 VIEW COMPARISON:  02/19/2018. FINDINGS: Again demonstrated is a poor inspiration. Mild bibasilar atelectasis. Normal sized heart. Tortuous and calcified thoracic aorta. Mild diffuse peribronchial thickening. Mild thoracic spine degenerative changes. Diffuse osteopenia. Cholecystectomy clips. Gas and fluid distended stomach. IMPRESSION: 1. Poor inspiration with mild bibasilar atelectasis. 2. Mild bronchitic changes. 3. Gastric distention. Electronically Signed   By: Claudie Revering M.D.   On: 03/02/2018 21:08   Ct Chest W Contrast  Result Date: 03/22/2018 CLINICAL DATA:  Difficulty swallowing. Nausea and vomiting. Hypertension. Emphysema. EXAM: CT CHEST,  ABDOMEN, AND PELVIS WITH CONTRAST TECHNIQUE: Multidetector CT imaging of the chest, abdomen and pelvis was performed following the standard protocol during bolus administration of intravenous contrast. CONTRAST:  120mL ISOVUE-300 IOPAMIDOL (ISOVUE-300) INJECTION 61% COMPARISON:  Abdominopelvic CT 02/19/2018. Chest radiograph earlier today. FINDINGS: CT CHEST FINDINGS Cardiovascular: Aortic and branch vessel atherosclerosis. 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 Normal heart size, without pericardial effusion. Multivessel coronary artery atherosclerosis. Mediastinum/Nodes: Right-sided thyroid enlargement with a 1.2 cm nodule within, nonspecific. No mediastinal or hilar adenopathy. The esophagus is dilated and fluid-filled, including on image 18/3. No well-defined obstructive mass. Lungs/Pleura: Small left-sided pleural effusion. Bibasilar volume loss with diaphragmatic elevation and overlying subsegmental atelectasis in both lung bases. Musculoskeletal: No acute osseous abnormality. CT ABDOMEN PELVIS FINDINGS Hepatobiliary: Normal liver. Cholecystectomy, without biliary ductal dilatation. Pancreas: Normal, without mass or ductal dilatation. Spleen: Normal in size, without focal abnormality. Adrenals/Urinary Tract: Mild left adrenal nodularity. Normal right adrenal gland. Normal kidneys, without hydronephrosis. Foley catheter within the urinary bladder. The bladder is not entirely decompressed. Mildly thick walled bladder. Stomach/Bowel: Normal stomach, without wall thickening. Scattered colonic diverticula. Normal terminal ileum. Normal small bowel. Vascular/Lymphatic: Aortic and branch vessel atherosclerosis. No abdominopelvic adenopathy. Reproductive: Normal prostate. Other: No significant free fluid. Decreased anterior pelvic wall edema. No free intraperitoneal air. Musculoskeletal: Osteopenia. Presumably degenerative partial fusion of the bilateral sacroiliac joints. Lumbosacral  spondylosis. IMPRESSION: 1. Fluid-filled, dilated esophagus. This could represent esophageal dysmotility or an otherwise occult obstructive lesion. Consider nonemergent esophagram. 2. No other acute process in the chest abdomen or pelvis. 3. Foley catheter within the urinary bladder. Wall thickening within the bladder may represent cystitis. Improved presumed cellulitis about the ventral pelvic wall. Lack of entire bladder decompression may suggest Foley catheter dysfunction. 4. Coronary artery atherosclerosis. Aortic Atherosclerosis (ICD10-I70.0). 5. Low lung volumes with diaphragmatic elevation bilaterally. Electronically Signed   By: Abigail Miyamoto M.D.   On: 03/22/2018 18:51   Ct Abdomen Pelvis W Contrast  Result Date: 03/22/2018 CLINICAL DATA:  Difficulty swallowing. Nausea and vomiting. Hypertension. Emphysema. EXAM: CT CHEST, ABDOMEN, AND PELVIS WITH CONTRAST TECHNIQUE: Multidetector CT imaging of the chest, abdomen and pelvis was performed following the standard protocol during bolus administration of intravenous contrast. CONTRAST:  132mL ISOVUE-300 IOPAMIDOL (ISOVUE-300) INJECTION 61% COMPARISON:  Abdominopelvic CT 02/19/2018. Chest radiograph earlier today. FINDINGS: CT CHEST FINDINGS Cardiovascular: Aortic and branch vessel atherosclerosis. 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 Normal heart size, without  pericardial effusion. Multivessel coronary artery atherosclerosis. Mediastinum/Nodes: Right-sided thyroid enlargement with a 1.2 cm nodule within, nonspecific. No mediastinal or hilar adenopathy. The esophagus is dilated and fluid-filled, including on image 18/3. No well-defined obstructive mass. Lungs/Pleura: Small left-sided pleural effusion. Bibasilar volume loss with diaphragmatic elevation and overlying subsegmental atelectasis in both lung bases. Musculoskeletal: No acute osseous abnormality. CT ABDOMEN PELVIS FINDINGS Hepatobiliary: Normal liver.  Cholecystectomy, without biliary ductal dilatation. Pancreas: Normal, without mass or ductal dilatation. Spleen: Normal in size, without focal abnormality. Adrenals/Urinary Tract: Mild left adrenal nodularity. Normal right adrenal gland. Normal kidneys, without hydronephrosis. Foley catheter within the urinary bladder. The bladder is not entirely decompressed. Mildly thick walled bladder. Stomach/Bowel: Normal stomach, without wall thickening. Scattered colonic diverticula. Normal terminal ileum. Normal small bowel. Vascular/Lymphatic: Aortic and branch vessel atherosclerosis. No abdominopelvic adenopathy. Reproductive: Normal prostate. Other: No significant free fluid. Decreased anterior pelvic wall edema. No free intraperitoneal air. Musculoskeletal: Osteopenia. Presumably degenerative partial fusion of the bilateral sacroiliac joints. Lumbosacral spondylosis. IMPRESSION: 1. Fluid-filled, dilated esophagus. This could represent esophageal dysmotility or an otherwise occult obstructive lesion. Consider nonemergent esophagram. 2. No other acute process in the chest abdomen or pelvis. 3. Foley catheter within the urinary bladder. Wall thickening within the bladder may represent cystitis. Improved presumed cellulitis about the ventral pelvic wall. Lack of entire bladder decompression may suggest Foley catheter dysfunction. 4. Coronary artery atherosclerosis. Aortic Atherosclerosis (ICD10-I70.0). 5. Low lung volumes with diaphragmatic elevation bilaterally. Electronically Signed   By: Abigail Miyamoto M.D.   On: 03/22/2018 18:51   Dg Esophagus  Result Date: 03/28/2018 CLINICAL DATA:  Dysphagia, possibly achalasia, history of Barrett's, esophageal dilatation, strictures EXAM: ESOPHOGRAM/BARIUM SWALLOW TECHNIQUE: Single contrast examination was performed using thin and thick barium. FLUOROSCOPY TIME:  Fluoroscopy Time:  96 seconds Radiation Exposure Index (if provided by the fluoroscopic device): 29.4 mGy COMPARISON:   None available FINDINGS: No evidence of mucosal lesion. Mild esophageal tortuosity in its proximal and mid segment. Esophagus distends to normal caliber throughout. No focal stricture. No extravasation. Poor primary peristaltic contractions and slow incomplete clearance of contrast from the mid esophagus. Reflux maneuvers were not attempted. IMPRESSION: Presbyesophagus without stricture. Electronically Signed   By: Lucrezia Europe M.D.   On: 03/28/2018 11:35   Dg Swallowing Func-speech Pathology  Result Date: 03/26/2018 Objective Swallowing Evaluation: No data recorded Patient Details Name: Warren [name] MRN: [id number] Date of Birth: Jan 05, 1922 Today's Date: 03/26/2018 Time: SLP Start Time (ACUTE ONLY): 1145 -SLP Stop Time (ACUTE ONLY): 1159 SLP Time Calculation (min) (ACUTE ONLY): 14 min Past Medical History: Past Medical History: Diagnosis Date . CAD (coronary artery disease)   moderate by catheterization,11/08...essentially unchanged by repeat study,10/11 . Carotid artery disease (HCC)    less than 50% stenosis bilaterally March 2013  . Claudication ([place])    ABIs within ABIs within normal limits, 40% left iliac disease by catheterization  . Colon polyps  . Depression  . Dizzy  . Edema extremities    secondary to severe varicose veins. Nonpitting edema and dermatitis.  . Emphysema  . Glaucoma  . Hyperlipidemia  . IBS (irritable bowel syndrome)  . Mitral regurgitation   mild . Mixed dyslipidemia  . Peripheral edema  . Pneumonia  . Prostate cancer [facility])   s/p radiation treatment/s/p TURP . Shortness of breath   Emphysema . Thyroid nodule    posterior and inferior to the right lobepossibly outside of the gland secondary to parathyroid adenoma in addition multiple bilateral thyroid nodules  Past Surgical History: Past  Surgical History: Procedure Laterality Date . BIOPSY  03/24/2018  Procedure: BIOPSY;  Surgeon: Jackquline Denmark, MD;  Location: [facility];  Service: Endoscopy;; . CARDIAC CATHETERIZATION  2008 .  CHOLECYSTECTOMY   . CYSTOSCOPY N/A 02/27/2018  Procedure: CYSTOSCOPY;  Surgeon: Bjorn Loser, MD;  Location: [facility];  Service: Urology;  Laterality: N/A; . ESOPHAGOGASTRODUODENOSCOPY (EGD) WITH PROPOFOL N/A 03/24/2018  Procedure: ESOPHAGOGASTRODUODENOSCOPY (EGD) WITH PROPOFOL;  Surgeon: Jackquline Denmark, MD;  Location: [facility];  Service: Endoscopy;  Laterality: N/A; . INCONTINENCE SURGERY    ring inserted . MALONEY DILATION  03/24/2018  Procedure: Venia Minks DILATION;  Surgeon: Jackquline Denmark, MD;  Location: [facility];  Service: Endoscopy;; . TONSILLECTOMY   . TRANSURETHRAL RESECTION OF PROSTATE    has stress urinary incontinence . URINARY SPHINCTER IMPLANT N/A 02/27/2018  Procedure: REMOVAL INFECTED ARTIFICIAL URINARY SPHINCTER;  Surgeon: Bjorn Loser, MD;  Location: [facility];  Service: Urology;  Laterality: N/A; HPI: 84 year old male who presented with nausea and vomiting.  He does have the significant past medical history for coronary artery disease, emphysema, esophageal stricture, chronic indwelling Foley catheter, anxiety, depression and dementia.   Pt is s/p EGD dilatation completed and recommendation for follow up barium swallow, possible manometry.  Pt had difficulty managing intake yesterday demonstrated by poor intake and regurgitation.  SLP was ordered and recommended MBS to determine least restrictive diet and possible compensation strategies.   CT Abdomen showed Fluid-filled, dilated esophagus. This could represent esophageal dysmotility or an otherwise occult obstructive lesion. Consider esophagram.  Pt was placed on pudding thick liquids.    Subjective: pt awake in chair Assessment / Plan / Recommendation CHL IP CLINICAL IMPRESSIONS 03/26/2018 Clinical Impression Pt with functional oropharyngeal swallow without aspiration or penetration.  He does piecemeal significantly with liquids which SLP suspects is compensatory for esophageal deficits.  Upon esophageal sweep, pt appeared with residuals  without adequate sensation.  Various liquid consistencies did not appear to clear esophagus better.  Advised him to consume small frequent amounts of intake pending outpt esophageal work-up.  Pt also appeared with suspected residuals in esophagus that was not barium - ? if food residuals.  Did not provide barium tablet due to concerns for resdiuals.  Pt states "I don't know what they will do about it, I'm 84 years old".     SLP Visit Diagnosis Dysphagia, unspecified (R13.10) Attention and concentration deficit following -- Frontal lobe and executive function deficit following -- Impact on safety and function Moderate aspiration risk   CHL IP TREATMENT RECOMMENDATION 03/26/2018 Treatment Recommendations No treatment recommended at this time   No flowsheet data found. CHL IP DIET RECOMMENDATION 03/26/2018 SLP Diet Recommendations Other (Comment) Liquid Administration via Cup;Straw Medication Administration Other (Comment) Compensations Slow rate;Small sips/bites Postural Changes Remain semi-upright after after feeds/meals (Comment);Seated upright at 90 degrees   CHL IP OTHER RECOMMENDATIONS 03/26/2018 Recommended Consults -- Oral Care Recommendations Oral care BID Other Recommendations --   CHL IP FOLLOW UP RECOMMENDATIONS 03/26/2018 Follow up Recommendations None   No flowsheet data found.     CHL IP ORAL PHASE 03/26/2018 Oral Phase Impaired Oral - Pudding Teaspoon -- Oral - Pudding Cup -- Oral - Honey Teaspoon -- Oral - Honey Cup -- Oral - Nectar Teaspoon -- Oral - Nectar Cup Piecemeal swallowing Oral - Nectar Straw -- Oral - Thin Teaspoon WFL Oral - Thin Cup Piecemeal swallowing Oral - Thin Straw Piecemeal swallowing Oral - Puree Piecemeal swallowing Oral - Mech Soft Piecemeal swallowing Oral - Regular -- Oral - Multi-Consistency -- Oral -  Pill -- Oral Phase - Comment --  CHL IP PHARYNGEAL PHASE 03/26/2018 Pharyngeal Phase Impaired Pharyngeal- Pudding Teaspoon -- Pharyngeal -- Pharyngeal- Pudding Cup -- Pharyngeal --  Pharyngeal- Honey Teaspoon -- Pharyngeal -- Pharyngeal- Honey Cup -- Pharyngeal -- Pharyngeal- Nectar Teaspoon -- Pharyngeal -- Pharyngeal- Nectar Cup WFL Pharyngeal -- Pharyngeal- Nectar Straw -- Pharyngeal -- Pharyngeal- Thin Teaspoon WFL Pharyngeal -- Pharyngeal- Thin Cup WFL Pharyngeal -- Pharyngeal- Thin Straw WFL Pharyngeal -- Pharyngeal- Puree WFL Pharyngeal -- Pharyngeal- Mechanical Soft WFL Pharyngeal -- Pharyngeal- Regular -- Pharyngeal -- Pharyngeal- Multi-consistency -- Pharyngeal -- Pharyngeal- Pill -- Pharyngeal -- Pharyngeal Comment --  CHL IP CERVICAL ESOPHAGEAL PHASE 03/26/2018 Cervical Esophageal Phase Impaired Pudding Teaspoon -- Pudding Cup -- Honey Teaspoon -- Honey Cup -- Nectar Teaspoon -- Nectar Cup -- Nectar Straw -- Thin Teaspoon -- Thin Cup -- Thin Straw -- Puree -- Mechanical Soft -- Regular -- Multi-consistency -- Pill -- Cervical Esophageal Comment appearance of prominent cricopharyngeus - did not impact barium flow Macario Golds 03/26/2018, 12:20 PM Luanna Salk, [facility] SLP [phone]               Microbiology: Recent Results (from the past 240 hour(s))  Culture, blood (Routine x 2)     Status: None   Collection Time: 03/22/18  4:06 PM  Result Value Ref Range Status   Specimen Description   Final    BLOOD LEFT ANTECUBITAL Performed at [facility], [place] [address], [place], [place] [address]    Special Requests   Final    BOTTLES DRAWN AEROBIC AND ANAEROBIC Blood Culture results may not be optimal due to an excessive volume of blood received in culture bottles Performed at [place] [address], [place], [place] [address]    Culture   Final    NO GROWTH 5 DAYS Performed at [facility], [place] [address], [place], [place] [address]    Report Status 03/27/2018 FINAL  Final  Urine culture     Status: Abnormal   Collection Time: 03/22/18  6:12 PM  Result Value Ref Range Status   Specimen Description   Final     URINE, RANDOM Performed at [place] [address], [place], [place] [address]    Special Requests   Final    NONE Performed at [facility], [place] [address] [place], [place] [address]    Culture MULTIPLE SPECIES PRESENT, SUGGEST RECOLLECTION (A)  Final   Report Status 03/24/2018 FINAL  Final  Culture, blood (Routine x 2)     Status: None   Collection Time: 03/22/18  7:01 PM  Result Value Ref Range Status   Specimen Description   Final    BLOOD LEFT HAND Performed at [place] [address], [place], [place] [address]    Special Requests   Final    BOTTLES DRAWN AEROBIC AND ANAEROBIC Blood Culture adequate volume Performed at [place] [address], [place],  [address]    Culture   Final    NO GROWTH 5 DAYS Performed at [facility], [place] [address] [place],  [address]    Report Status 03/27/2018 FINAL  Final     Labs: Basic Metabolic Panel: Recent Labs  Lab 03/24/18 0541 03/25/18 0530  NA 138 139  K 3.9 3.9  CL 105 103  CO2 26 28  GLUCOSE 109* 105*  BUN 11 12  CREATININE 0.64 0.58*  CALCIUM 7.9* 7.9*   Liver Function Tests: No results for input(s): AST, ALT, ALKPHOS, BILITOT, PROT, ALBUMIN in the last 168 hours. No results for input(s): LIPASE, AMYLASE in the last 168 hours. No results for input(s): AMMONIA in the last 168 hours. CBC: Recent Labs  Lab 03/24/18 0541 03/25/18 0530  WBC 6.6 6.2  NEUTROABS 4.8 4.4  HGB 11.6* 11.0*  HCT 36.6* 34.8*  MCV 95.3 94.8  PLT 158 167   Cardiac Enzymes: No results for input(s): CKTOTAL, CKMB, CKMBINDEX, TROPONINI in the last 168 hours. BNP: BNP (last 3 results) Recent Labs    03/22/18 1606  BNP 39.0    ProBNP (last 3 results) No results for input(s): PROBNP in the last 8760 hours.  CBG: Recent Labs  Lab 03/25/18 2210 03/26/18 0044 03/26/18 0359 03/26/18 0737 03/26/18 2226  GLUCAP 96 81 87 93  127*     Signed:  Velvet Bathe MD.  [facility] 03/30/2018, 1:38 PM

## 2018-03-30 NOTE — Consult Note (Signed)
   [facility] Inpatient Consult   03/30/2018  VINNY TARANTO Jan 22, 1922 [id number]   [facility] Care Management follow up.   Chart reviewed. Noted discharge plan remains for SNF placement.   No identifiable [facility] Care Management needs noted. Appears patient is slated for dc to [facility] SNF today.    Marthenia Rolling, MSN-Ed, RN,BSN [facility] Liaison [phone]

## 2018-03-31 ENCOUNTER — Encounter: Payer: Self-pay | Admitting: Internal Medicine

## 2018-03-31 ENCOUNTER — Telehealth: Payer: Self-pay

## 2018-03-31 ENCOUNTER — Non-Acute Institutional Stay (SKILLED_NURSING_FACILITY): Payer: Medicare Other | Admitting: Internal Medicine

## 2018-03-31 DIAGNOSIS — F419 Anxiety disorder, unspecified: Secondary | ICD-10-CM | POA: Diagnosis not present

## 2018-03-31 DIAGNOSIS — B3781 Candidal esophagitis: Secondary | ICD-10-CM | POA: Diagnosis not present

## 2018-03-31 DIAGNOSIS — R112 Nausea with vomiting, unspecified: Secondary | ICD-10-CM

## 2018-03-31 DIAGNOSIS — E860 Dehydration: Secondary | ICD-10-CM | POA: Diagnosis not present

## 2018-03-31 DIAGNOSIS — B965 Pseudomonas (aeruginosa) (mallei) (pseudomallei) as the cause of diseases classified elsewhere: Secondary | ICD-10-CM | POA: Diagnosis not present

## 2018-03-31 DIAGNOSIS — K22719 Barrett's esophagus with dysplasia, unspecified: Secondary | ICD-10-CM | POA: Diagnosis not present

## 2018-03-31 DIAGNOSIS — F32A Depression, unspecified: Secondary | ICD-10-CM

## 2018-03-31 DIAGNOSIS — N390 Urinary tract infection, site not specified: Secondary | ICD-10-CM

## 2018-03-31 DIAGNOSIS — E785 Hyperlipidemia, unspecified: Secondary | ICD-10-CM | POA: Diagnosis not present

## 2018-03-31 DIAGNOSIS — F329 Major depressive disorder, single episode, unspecified: Secondary | ICD-10-CM

## 2018-03-31 DIAGNOSIS — G9341 Metabolic encephalopathy: Secondary | ICD-10-CM | POA: Diagnosis not present

## 2018-03-31 NOTE — Telephone Encounter (Signed)
He already had a barium swallow done which shows severe esophageal dysmotility/presbyesophagus Hold off on esophageal manometry-I do not think he can tolerate that

## 2018-03-31 NOTE — Progress Notes (Signed)
: Provider:   Noah Delaine. Sheppard Coil, MD Location:  [place] Room Number: 110P Place of Service:  SNF ([phone])  PCP: [name], MD Patient Care Team: [name], MD as PCP - General (Unknown Physician Specialty)  Extended Emergency Contact Information Primary Emergency Contact: Shahrukh, Pasch Home Phone: [phone] Mobile Phone: [phone] Relation: Son Secondary Emergency Contact: Vinnie Level Mobile Phone: [phone] Relation: Daughter     Allergies: Aspirin; Latex; and Penicillins  Chief Complaint  Patient presents with  . Readmit To SNF    Readmit to Facility     HPI: Patient is 84 y.o. male Who was admitted to [facility] from 5/20-28 for intractable nausea and vomiting due to esophageal dysmotility.  Patient is status post EGD and modified barium swallow and is found to have Barrett's esophagitis.  He was still having dysphasia and unable to keep food and water down to GI was consulted for further evaluation and they recommended recommendations of speech therapy, [facility] all meets and if still with problems consider esophageal manometry which is doubtful that patient could tolerate.  Patient is to follow-up with GI clinic if still with problems; calcium channel blockers could be tried Botox could be tried.  Hospital course was complicated by his Pseudomonas urinary tract infection which she had had had prior for which patient completed a course of meropenem patient also had acute metabolic encephalopathy in the setting of dementia which is improved and suspect to be back to baseline patient had dehydration which of course was due to his lack of p.o. intake which improved with IV fluids and also candidal esophagitis which was treated with Diflucan patient is readmitted back to SNF for OT/PT.  While at skilled nursing facility patient will be followed for depression treated with Celexa, anxiety treated with lorazepam and  hyperlipidemia treated with pravastatin.  Past Medical History:  Diagnosis Date  . CAD (coronary artery disease)    moderate by catheterization,11/08...essentially unchanged by repeat study,10/11  . Carotid artery disease (HCC)     less than 50% stenosis bilaterally March 2013   . Claudication ([place])     ABIs within ABIs within normal limits, 40% left iliac disease by catheterization   . Colon polyps   . Depression   . Dizzy   . Edema extremities     secondary to severe varicose veins. Nonpitting edema and dermatitis.   . Emphysema   . Glaucoma   . Hyperlipidemia   . IBS (irritable bowel syndrome)   . Mitral regurgitation    mild  . Mixed dyslipidemia   . Peripheral edema   . Pneumonia   . Prostate cancer [facility])    s/p radiation treatment/s/p TURP  . Shortness of breath    Emphysema  . Thyroid nodule     posterior and inferior to the right lobepossibly outside of the gland secondary to parathyroid adenoma in addition multiple bilateral thyroid nodules     Past Surgical History:  Procedure Laterality Date  . BIOPSY  03/24/2018   Procedure: BIOPSY;  Surgeon: Jackquline Denmark, MD;  Location: [facility] ENDOSCOPY;  Service: Endoscopy;;  . CARDIAC CATHETERIZATION  2008  . CHOLECYSTECTOMY    . CYSTOSCOPY N/A 02/27/2018   Procedure: CYSTOSCOPY;  Surgeon: Bjorn Loser, MD;  Location: [facility] ORS;  Service: Urology;  Laterality: N/A;  . ESOPHAGOGASTRODUODENOSCOPY (EGD) WITH PROPOFOL N/A 03/24/2018   Procedure: ESOPHAGOGASTRODUODENOSCOPY (EGD) WITH PROPOFOL;  Surgeon: Jackquline Denmark, MD;  Location: [facility];  Service: Endoscopy;  Laterality: N/A;  . INCONTINENCE SURGERY     ring inserted  . MALONEY DILATION  03/24/2018   Procedure: Venia Minks DILATION;  Surgeon: Jackquline Denmark, MD;  Location: [facility];  Service: Endoscopy;;  . TONSILLECTOMY    . TRANSURETHRAL RESECTION OF PROSTATE     has stress urinary incontinence  . URINARY SPHINCTER IMPLANT N/A 02/27/2018   Procedure: REMOVAL INFECTED  ARTIFICIAL URINARY SPHINCTER;  Surgeon: Bjorn Loser, MD;  Location: [facility];  Service: Urology;  Laterality: N/A;    Allergies as of 03/31/2018      Reactions   Aspirin    REACTION: SPOTS REACTION: SPOTS   Latex    unknown   Penicillins    Has patient had a PCN reaction causing immediate rash, facial/tongue/throat swelling, SOB or lightheadedness with hypotension:No Has patient had a PCN reaction causing severe rash involving mucus membranes or skin necrosis: No Has patient had a PCN reaction that required hospitalization:NoHas patient had a PCN reaction occurring within the last 10 years:No If all of the above answers are "NO", then may proceed with Cephalosporin use.      Medication List        Accurate as of 03/31/18  8:47 AM. Always use your most recent med list.          alendronate 70 MG tablet Commonly known as:  FOSAMAX Take 70 mg by mouth once a week. Take with a full glass of water on an empty stomach. Do not lay day for 30 mins   bisacodyl 10 MG suppository Commonly known as:  DULCOLAX Place 10 mg rectally daily as needed for moderate constipation.   CALCIUM 600 600 MG Tabs tablet Generic drug:  calcium carbonate Take 600 mg by mouth 2 (two) times daily.   citalopram 20 MG tablet Commonly known as:  CELEXA Take 20 mg by mouth daily.   LORazepam 1 MG tablet Commonly known as:  ATIVAN Take 1 tablet (1 mg total) by mouth at bedtime.   magnesium hydroxide 400 MG/5ML suspension Commonly known as:  MILK OF MAGNESIA Take 30 mLs by mouth daily as needed for mild constipation.   MYRBETRIQ 50 MG Tb24 tablet Generic drug:  mirabegron ER Take 50 mg by mouth daily.   omeprazole 20 MG tablet Commonly known as:  PRILOSEC OTC Take 1 tablet (20 mg total) by mouth daily.   oxybutynin 5 MG tablet Commonly known as:  DITROPAN Take 1 tablet (5 mg total) by mouth 2 (two) times daily.   pravastatin 40 MG tablet Commonly known as:  PRAVACHOL Take 40 mg by mouth  daily.   Vitamin D3 10000 units Tabs Take 2,000 Units by mouth daily.       No orders of the defined types were placed in this encounter.    There is no immunization history on file for this patient.  Social History   Tobacco Use  . Smoking status: Former Smoker    Packs/day: 1.00    Years: 40.00    Pack years: 40.00    Types: Cigarettes    Last attempt to quit: 11/04/1983    Years since quitting: 34.4  . Smokeless tobacco: Never Used  Substance Use Topics  . Alcohol use: No    Family history is   Family History  Problem Relation Age of Onset  . Heart attack Sister        in her 27's  . Other Mother        lived to be 66  .  Pancreatic cancer Father   . Crohn's disease Son   . Colon cancer Neg Hx       Review of Systems  DATA OBTAINED: from patient, nurse GENERAL:  no fevers, fatigue, appetite changes SKIN: No itching, or rash EYES: No eye pain, redness, discharge EARS: No earache, tinnitus, change in hearing NOSE: No congestion, drainage or bleeding  MOUTH/THROAT: No mouth or tooth pain, No sore throat RESPIRATORY: No cough, wheezing, SOB CARDIAC: No chest pain, palpitations, lower extremity edema  GI: No abdominal pain, No N/V/D or constipation, No heartburn or reflux  GU: No dysuria, frequency or urgency, or incontinence  MUSCULOSKELETAL: No unrelieved bone/joint pain NEUROLOGIC: No headache, dizziness or focal weakness PSYCHIATRIC: No c/o anxiety or sadness   Vitals:   03/31/18 0824  BP: 124/81  Pulse: 77  Resp: 18  Temp: (!) 97.1 F (36.2 C)  SpO2: 96%    SpO2 Readings from Last 1 Encounters:  03/31/18 96%   Body mass index is 26.63 kg/m.     Physical Exam  GENERAL APPEARANCE: Alert, conversant,  No acute distress.  SKIN: No diaphoresis rash HEAD: Normocephalic, atraumatic  EYES: Conjunctiva/lids clear. Pupils round, reactive. EOMs intact.  EARS: External exam WNL, canals clear. Hearing grossly normal.  NOSE: No deformity or  discharge.  MOUTH/THROAT: Lips w/o lesions  RESPIRATORY: Breathing is even, unlabored. Lung sounds are clear   CARDIOVASCULAR: Heart RRR no murmurs, rubs or gallops. No peripheral edema.   GASTROINTESTINAL: Abdomen is soft, non-tender, not distended w/ normal bowel sounds. GENITOURINARY: Bladder non tender, not distended  MUSCULOSKELETAL: No abnormal joints or musculature NEUROLOGIC:  Cranial nerves 2-12 grossly intact. Moves all extremities  PSYCHIATRIC: Mood and affect appropriate to situation, no behavioral issues  Patient Active Problem List   Diagnosis Date Noted  . Esophageal dysfunction 03/23/2018  . Urinary tract infection associated with catheterization of urinary tract, initial encounter ([place]) 03/23/2018  . Leukocytosis 03/23/2018  . Dementia 03/23/2018  . Dehydration   . Acute metabolic encephalopathy   . Nausea and vomiting 03/22/2018  . Pseudomonas urinary tract infection 03/06/2018  . Depression 03/06/2018  . Anxiety and depression 03/06/2018  . Osteoporosis 03/06/2018  . Acute lower UTI 03/01/2018  . Internal device, implant, or graft infection or inflammation ([place]) 03/01/2018  . Emphysema   . Thyroid nodule   . Carotid artery disease ([place])   . CAD (coronary artery disease)   . Edema extremities   . Claudication ([place]) 07/30/2011  . Dyslipidemia 07/03/2010  . HYPERTENSION, BENIGN 07/03/2010  . PERIPHERAL EDEMA 07/03/2010  . SHORTNESS OF BREATH 07/03/2010  . PRECORDIAL PAIN 07/03/2010      Labs reviewed: Basic Metabolic Panel:    Component Value Date/Time   NA 139 03/25/2018 0530   K 3.9 03/25/2018 0530   CL 103 03/25/2018 0530   CO2 28 03/25/2018 0530   GLUCOSE 105 (H) 03/25/2018 0530   BUN 12 03/25/2018 0530   CREATININE 0.58 (L) 03/25/2018 0530   CALCIUM 7.9 (L) 03/25/2018 0530   PROT 7.3 03/22/2018 1606   ALBUMIN 3.8 03/22/2018 1606   AST 15 03/22/2018 1606   ALT 11 (L) 03/22/2018 1606   ALKPHOS 47 03/22/2018 1606   BILITOT 1.3 (H) 03/22/2018  1606   GFRNONAA >60 03/25/2018 0530   GFRAA >60 03/25/2018 0530    Recent Labs    03/23/18 0524 03/24/18 0541 03/25/18 0530  NA 137 138 139  K 3.8 3.9 3.9  CL 102 105 103  CO2 26 26 28  GLUCOSE 109* 109* 105*  BUN 11 11 12   CREATININE 0.71 0.64 0.58*  CALCIUM 8.3* 7.9* 7.9*   Liver Function Tests: Recent Labs    02/28/18 0511 03/01/18 0456 03/22/18 1606  AST 11* 19 15  ALT 11* 16* 11*  ALKPHOS 31* 33* 47  BILITOT 0.5 0.4 1.3*  PROT 5.5* 5.6* 7.3  ALBUMIN 2.5* 2.6* 3.8   Recent Labs    03/22/18 1606  LIPASE 27   No results for input(s): AMMONIA in the last 8760 hours. CBC: Recent Labs    03/22/18 1606 03/23/18 0524 03/24/18 0541 03/25/18 0530  WBC 15.3* 9.3 6.6 6.2  NEUTROABS 13.1*  --  4.8 4.4  HGB 14.5 11.8* 11.6* 11.0*  HCT 44.3 36.7* 36.6* 34.8*  MCV 94.1 95.1 95.3 94.8  PLT 216 184 158 167   Lipid No results for input(s): CHOL, HDL, LDLCALC, TRIG in the last 8760 hours.  Cardiac Enzymes: No results for input(s): CKTOTAL, CKMB, CKMBINDEX, TROPONINI in the last 8760 hours. BNP: Recent Labs    03/22/18 1606  BNP 39.0   No results found for: MICROALBUR No results found for: HGBA1C No results found for: TSH No results found for: VITAMINB12 No results found for: FOLATE No results found for: IRON, TIBC, FERRITIN  Imaging and Procedures obtained prior to SNF admission: Dg Chest 2 View  Result Date: 03/22/2018 CLINICAL DATA:  Difficulty swallowing for the past 24 hours. EXAM: CHEST - 2 VIEW COMPARISON:  03/02/2018 FINDINGS: The cardiac silhouette, mediastinal and hilar contours are stable. There is tortuosity and calcification of the thoracic aorta. Low lung volumes with vascular crowding and streaky bibasilar atelectasis. No definite pleural effusions. The bony thorax is grossly intact. IMPRESSION: Low lung volumes with vascular crowding and basilar atelectasis. No definite infiltrates or effusions. Electronically Signed   By: Marijo Sanes M.D.    On: 03/22/2018 17:08   Ct Chest W Contrast  Result Date: 03/22/2018 CLINICAL DATA:  Difficulty swallowing. Nausea and vomiting. Hypertension. Emphysema. EXAM: CT CHEST, ABDOMEN, AND PELVIS WITH CONTRAST TECHNIQUE: Multidetector CT imaging of the chest, abdomen and pelvis was performed following the standard protocol during bolus administration of intravenous contrast. CONTRAST:  154mL ISOVUE-300 IOPAMIDOL (ISOVUE-300) INJECTION 61% COMPARISON:  Abdominopelvic CT 02/19/2018. Chest radiograph earlier today. FINDINGS: CT CHEST FINDINGS Cardiovascular: Aortic and branch vessel atherosclerosis. 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 Normal heart size, without pericardial effusion. Multivessel coronary artery atherosclerosis. Mediastinum/Nodes: Right-sided thyroid enlargement with a 1.2 cm nodule within, nonspecific. No mediastinal or hilar adenopathy. The esophagus is dilated and fluid-filled, including on image 18/3. No well-defined obstructive mass. Lungs/Pleura: Small left-sided pleural effusion. Bibasilar volume loss with diaphragmatic elevation and overlying subsegmental atelectasis in both lung bases. Musculoskeletal: No acute osseous abnormality. CT ABDOMEN PELVIS FINDINGS Hepatobiliary: Normal liver. Cholecystectomy, without biliary ductal dilatation. Pancreas: Normal, without mass or ductal dilatation. Spleen: Normal in size, without focal abnormality. Adrenals/Urinary Tract: Mild left adrenal nodularity. Normal right adrenal gland. Normal kidneys, without hydronephrosis. Foley catheter within the urinary bladder. The bladder is not entirely decompressed. Mildly thick walled bladder. Stomach/Bowel: Normal stomach, without wall thickening. Scattered colonic diverticula. Normal terminal ileum. Normal small bowel. Vascular/Lymphatic: Aortic and branch vessel atherosclerosis. No abdominopelvic adenopathy. Reproductive: Normal prostate. Other: No significant free fluid. Decreased  anterior pelvic wall edema. No free intraperitoneal air. Musculoskeletal: Osteopenia. Presumably degenerative partial fusion of the bilateral sacroiliac joints. Lumbosacral spondylosis. IMPRESSION: 1. Fluid-filled, dilated esophagus. This could represent esophageal dysmotility or an otherwise occult obstructive lesion. Consider  nonemergent esophagram. 2. No other acute process in the chest abdomen or pelvis. 3. Foley catheter within the urinary bladder. Wall thickening within the bladder may represent cystitis. Improved presumed cellulitis about the ventral pelvic wall. Lack of entire bladder decompression may suggest Foley catheter dysfunction. 4. Coronary artery atherosclerosis. Aortic Atherosclerosis (ICD10-I70.0). 5. Low lung volumes with diaphragmatic elevation bilaterally. Electronically Signed   By: Abigail Miyamoto M.D.   On: 03/22/2018 18:51   Ct Abdomen Pelvis W Contrast  Result Date: 03/22/2018 CLINICAL DATA:  Difficulty swallowing. Nausea and vomiting. Hypertension. Emphysema. EXAM: CT CHEST, ABDOMEN, AND PELVIS WITH CONTRAST TECHNIQUE: Multidetector CT imaging of the chest, abdomen and pelvis was performed following the standard protocol during bolus administration of intravenous contrast. CONTRAST:  133mL ISOVUE-300 IOPAMIDOL (ISOVUE-300) INJECTION 61% COMPARISON:  Abdominopelvic CT 02/19/2018. Chest radiograph earlier today. FINDINGS: CT CHEST FINDINGS Cardiovascular: Aortic and branch vessel atherosclerosis. 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 Normal heart size, without pericardial effusion. Multivessel coronary artery atherosclerosis. Mediastinum/Nodes: Right-sided thyroid enlargement with a 1.2 cm nodule within, nonspecific. No mediastinal or hilar adenopathy. The esophagus is dilated and fluid-filled, including on image 18/3. No well-defined obstructive mass. Lungs/Pleura: Small left-sided pleural effusion. Bibasilar volume loss with diaphragmatic elevation and  overlying subsegmental atelectasis in both lung bases. Musculoskeletal: No acute osseous abnormality. CT ABDOMEN PELVIS FINDINGS Hepatobiliary: Normal liver. Cholecystectomy, without biliary ductal dilatation. Pancreas: Normal, without mass or ductal dilatation. Spleen: Normal in size, without focal abnormality. Adrenals/Urinary Tract: Mild left adrenal nodularity. Normal right adrenal gland. Normal kidneys, without hydronephrosis. Foley catheter within the urinary bladder. The bladder is not entirely decompressed. Mildly thick walled bladder. Stomach/Bowel: Normal stomach, without wall thickening. Scattered colonic diverticula. Normal terminal ileum. Normal small bowel. Vascular/Lymphatic: Aortic and branch vessel atherosclerosis. No abdominopelvic adenopathy. Reproductive: Normal prostate. Other: No significant free fluid. Decreased anterior pelvic wall edema. No free intraperitoneal air. Musculoskeletal: Osteopenia. Presumably degenerative partial fusion of the bilateral sacroiliac joints. Lumbosacral spondylosis. IMPRESSION: 1. Fluid-filled, dilated esophagus. This could represent esophageal dysmotility or an otherwise occult obstructive lesion. Consider nonemergent esophagram. 2. No other acute process in the chest abdomen or pelvis. 3. Foley catheter within the urinary bladder. Wall thickening within the bladder may represent cystitis. Improved presumed cellulitis about the ventral pelvic wall. Lack of entire bladder decompression may suggest Foley catheter dysfunction. 4. Coronary artery atherosclerosis. Aortic Atherosclerosis (ICD10-I70.0). 5. Low lung volumes with diaphragmatic elevation bilaterally. Electronically Signed   By: Abigail Miyamoto M.D.   On: 03/22/2018 18:51     Not all labs, radiology exams or other studies done during hospitalization come through on my EPIC note; however they are reviewed by me.    Assessment and Plan  Barrett's esophagitis/Candida esophagitis/intractable nausea and  vomiting- per GI consult patient is to eat in sitting position, follow recommendations of speech therapy, chopped all meats, if still with problems consider esophageal manometry- it is not felt he would be able to tolerate this; if still problems follow-up with GI clinic, which can then try calcium channel blockers or empiric Botox injections; avoid medications like Fosamax SNF -patient is admitted to skilled nursing facility for OT/PT; patient will continue on omeprazole 20 mg daily; it is presumed that it is a mistake that patient's Fosamax was on patient's discharge medication list  UTI/Pseudomonas treated with meropenem for 7 days; patient had had prior infection with Pseudomonas UTI  Acute encephalopathy-improved with treatment  Dehydration- due to inability to swallow, improved with IV fluids SNF -follow-up BMP  Depression  SNF -continue Celexa 20 mg daily  Anxiety SNF -continue lorazepam nightly  Hyperlipidemia SNF 0 continue Pravachol 40 mg daily; stable    Time spent greater than 35 minutes;> 50% of time with patient was spent reviewing records, labs, tests and studies, counseling and developing plan of care  Webb Silversmith D. Sheppard Coil, MD

## 2018-03-31 NOTE — Telephone Encounter (Signed)
I believe he is a resident in a local nursing home.  Do you want his PCP/nursing home doctor to direct care going forward?  Also, he is scheduled to see you 04/30/18 at 11:00 am.  I will wait to see if you want me to order the barium swallow and manometry.

## 2018-04-03 ENCOUNTER — Encounter: Payer: Self-pay | Admitting: Internal Medicine

## 2018-04-03 DIAGNOSIS — B3781 Candidal esophagitis: Secondary | ICD-10-CM | POA: Insufficient documentation

## 2018-04-03 DIAGNOSIS — F419 Anxiety disorder, unspecified: Secondary | ICD-10-CM | POA: Insufficient documentation

## 2018-04-03 DIAGNOSIS — K2270 Barrett's esophagus without dysplasia: Secondary | ICD-10-CM | POA: Insufficient documentation

## 2018-04-03 LAB — BASIC METABOLIC PANEL
BUN: 13 (ref 4–21)
CREATININE: 0.7 (ref 0.6–1.3)
GLUCOSE: 110
POTASSIUM: 3.6 (ref 3.4–5.3)
SODIUM: 139 (ref 137–147)

## 2018-04-09 DIAGNOSIS — N3942 Incontinence without sensory awareness: Secondary | ICD-10-CM | POA: Diagnosis not present

## 2018-04-18 ENCOUNTER — Encounter: Payer: Self-pay | Admitting: Internal Medicine

## 2018-04-19 ENCOUNTER — Encounter: Payer: Self-pay | Admitting: Internal Medicine

## 2018-04-19 ENCOUNTER — Non-Acute Institutional Stay (SKILLED_NURSING_FACILITY): Payer: Medicare Other | Admitting: Internal Medicine

## 2018-04-19 DIAGNOSIS — F0390 Unspecified dementia without behavioral disturbance: Secondary | ICD-10-CM | POA: Diagnosis not present

## 2018-04-19 DIAGNOSIS — E860 Dehydration: Secondary | ICD-10-CM

## 2018-04-19 DIAGNOSIS — B3781 Candidal esophagitis: Secondary | ICD-10-CM | POA: Diagnosis not present

## 2018-04-19 DIAGNOSIS — B965 Pseudomonas (aeruginosa) (mallei) (pseudomallei) as the cause of diseases classified elsewhere: Secondary | ICD-10-CM

## 2018-04-19 DIAGNOSIS — G9341 Metabolic encephalopathy: Secondary | ICD-10-CM

## 2018-04-19 DIAGNOSIS — K22719 Barrett's esophagus with dysplasia, unspecified: Secondary | ICD-10-CM | POA: Diagnosis not present

## 2018-04-19 DIAGNOSIS — N390 Urinary tract infection, site not specified: Secondary | ICD-10-CM

## 2018-04-19 DIAGNOSIS — F329 Major depressive disorder, single episode, unspecified: Secondary | ICD-10-CM | POA: Diagnosis not present

## 2018-04-19 DIAGNOSIS — F419 Anxiety disorder, unspecified: Secondary | ICD-10-CM | POA: Diagnosis not present

## 2018-04-19 DIAGNOSIS — R112 Nausea with vomiting, unspecified: Secondary | ICD-10-CM

## 2018-04-19 DIAGNOSIS — E785 Hyperlipidemia, unspecified: Secondary | ICD-10-CM | POA: Diagnosis not present

## 2018-04-19 DIAGNOSIS — F32A Depression, unspecified: Secondary | ICD-10-CM

## 2018-04-19 NOTE — Progress Notes (Signed)
Location:  [place] Room Number: 110P Place of Service:  SNF (31)  Noah Delaine. Sheppard Coil, MD   PCP: [name], MD Patient Care Team: [name], MD as PCP - General (Unknown Physician Specialty)  Extended Emergency Contact Information Primary Emergency Contact: Asser, Lucena Home Phone: [phone] Mobile Phone: [phone] Relation: Son Secondary Emergency Contact: Vinnie Level Mobile Phone: [phone] Relation: Daughter  Allergies  Allergen Reactions  . Aspirin     REACTION: SPOTS REACTION: SPOTS  . Latex     unknown  . Penicillins     Has patient had a PCN reaction causing immediate rash, facial/tongue/throat swelling, SOB or lightheadedness with hypotension:No Has patient had a PCN reaction causing severe rash involving mucus membranes or skin necrosis: No Has patient had a PCN reaction that required hospitalization:NoHas patient had a PCN reaction occurring within the last 10 years:No If all of the above answers are "NO", then may proceed with Cephalosporin use.     Chief Complaint  Patient presents with  . Discharge Note    Discharged from SNF    HPI:  84 y.o. male who was admitted to [facility] from 5/20-28 for intractable nausea and vomiting due to esophageal dysmotility.  Patient was status post EGD and modified barium swallow and found to have Barrett's esophagitis.  He was still having dysphasia and unable to keep food and water down so GI was consulted for further evaluation.  They recommended speech therapy, chopping all meats and if patient was still having problems to consider esophageal manometry, although it was doubtful that patient would be able to tolerate this.  Patient was to follow-up with GI clinic if still having problems or calcium channel blockers could be tried, Botox could be tried.  Hospital course was comp gated by Pseudomonas urinary tract infection and patient completed a course of  meropenem for same.  Patient also had dehydration which was due to his lack of p.o. intake which improved with IV fluid and candidal esophagitis which was treated with Diflucan.  Patient was admitted back to skilled nursing facility for OT/PT and is now ready to be discharged to home.    Past Medical History:  Diagnosis Date  . CAD (coronary artery disease)    moderate by catheterization,11/08...essentially unchanged by repeat study,10/11  . Carotid artery disease (HCC)     less than 50% stenosis bilaterally March 2013   . Claudication ([place])     ABIs within ABIs within normal limits, 40% left iliac disease by catheterization   . Colon polyps   . Depression   . Dizzy   . Edema extremities     secondary to severe varicose veins. Nonpitting edema and dermatitis.   . Emphysema   . Glaucoma   . Hyperlipidemia   . IBS (irritable bowel syndrome)   . Mitral regurgitation    mild  . Mixed dyslipidemia   . Peripheral edema   . Pneumonia   . Prostate cancer [facility])    s/p radiation treatment/s/p TURP  . Shortness of breath    Emphysema  . Thyroid nodule     posterior and inferior to the right lobepossibly outside of the gland secondary to parathyroid adenoma in addition multiple bilateral thyroid nodules     Past Surgical History:  Procedure Laterality Date  . BIOPSY  03/24/2018   Procedure: BIOPSY;  Surgeon: Jackquline Denmark, MD;  Location: [facility]OS[facility];  Service: Endoscopy;;  . CARDIAC CATHETERIZATION  2008  .  CHOLECYSTECTOMY    . CYSTOSCOPY N/A 02/27/2018   Procedure: CYSTOSCOPY;  Surgeon: Bjorn Loser, MD;  Location: [facility] ORS;  Service: Urology;  Laterality: N/A;  . ESOPHAGOGASTRODUODENOSCOPY (EGD) WITH PROPOFOL N/A 03/24/2018   Procedure: ESOPHAGOGASTRODUODENOSCOPY (EGD) WITH PROPOFOL;  Surgeon: Jackquline Denmark, MD;  Location: [facility] ENDOSCOPY;  Service: Endoscopy;  Laterality: N/A;  . INCONTINENCE SURGERY     ring inserted  . MALONEY DILATION  03/24/2018   Procedure: Venia Minks DILATION;   Surgeon: Jackquline Denmark, MD;  Location: [facility]OS[facility];  Service: Endoscopy;;  . TONSILLECTOMY    . TRANSURETHRAL RESECTION OF PROSTATE     has stress urinary incontinence  . URINARY SPHINCTER IMPLANT N/A 02/27/2018   Procedure: REMOVAL INFECTED ARTIFICIAL URINARY SPHINCTER;  Surgeon: Bjorn Loser, MD;  Location: [facility] ORS;  Service: Urology;  Laterality: N/A;     reports that he quit smoking about 34 years ago. His smoking use included cigarettes. He has a 40.00 pack-year smoking history. He has never used smokeless tobacco. He reports that he does not drink alcohol or use drugs. Social History   Socioeconomic History  . Marital status: Widowed    Spouse name: Not on file  . Number of children: 4  . Years of education: Not on file  . Highest education level: Not on file  Occupational History  . Occupation: RETIRED    Comment: Engineer, manufacturing systems at [employer]  . Financial resource strain: Not on file  . Food insecurity:    Worry: Not on file    Inability: Not on file  . Transportation needs:    Medical: Not on file    Non-medical: Not on file  Tobacco Use  . Smoking status: Former Smoker    Packs/day: 1.00    Years: 40.00    Pack years: 40.00    Types: Cigarettes    Last attempt to quit: 11/04/1983    Years since quitting: 34.5  . Smokeless tobacco: Never Used  Substance and Sexual Activity  . Alcohol use: No  . Drug use: No  . Sexual activity: Not on file  Lifestyle  . Physical activity:    Days per week: Not on file    Minutes per session: Not on file  . Stress: Not on file  Relationships  . Social connections:    Talks on phone: Not on file    Gets together: Not on file    Attends religious service: Not on file    Active member of club or organization: Not on file    Attends meetings of clubs or organizations: Not on file    Relationship status: Not on file  . Intimate partner violence:    Fear of current or ex partner: Not on file    Emotionally  abused: Not on file    Physically abused: Not on file    Forced sexual activity: Not on file  Other Topics Concern  . Not on file  Social History Narrative   Has four children   [employer] War [employer] Veteran/ he was in the [employer] and the [employer] during [place] [employer] Due  Topic Date Due  . PNA vac Low Risk Adult (1 of 2 - PCV13) 09/25/1989  . INFLUENZA VACCINE  06/03/2018    Medications: Allergies as of 04/19/2018      Reactions   Aspirin    REACTION: SPOTS REACTION: SPOTS   Latex    unknown   Penicillins    Has patient  had a PCN reaction causing immediate rash, facial/tongue/throat swelling, SOB or lightheadedness with hypotension:No Has patient had a PCN reaction causing severe rash involving mucus membranes or skin necrosis: No Has patient had a PCN reaction that required hospitalization:NoHas patient had a PCN reaction occurring within the last 10 years:No If all of the above answers are "NO", then may proceed with Cephalosporin use.      Medication List        Accurate as of 04/19/18 11:59 PM. Always use your most recent med list.          bisacodyl 10 MG suppository Commonly known as:  DULCOLAX Place 10 mg rectally daily as needed for moderate constipation.   calcium carbonate 500 MG chewable tablet Commonly known as:  TUMS - dosed in mg elemental calcium Chew 2 tablets by mouth daily.   Cholecalciferol 1000 units tablet Take 2,000 Units by mouth daily. 2 tabs   citalopram 20 MG tablet Commonly known as:  CELEXA Take 20 mg by mouth daily.   LORazepam 1 MG tablet Commonly known as:  ATIVAN Take 1 tablet (1 mg total) by mouth at bedtime.   magnesium hydroxide 400 MG/5ML suspension Commonly known as:  MILK OF MAGNESIA Take 30 mLs by mouth daily as needed for mild constipation.   MYRBETRIQ 50 MG Tb24 tablet Generic drug:  mirabegron ER Take 50 mg by mouth daily.   oxybutynin 5 MG tablet Commonly known as:  DITROPAN Take 1 tablet  (5 mg total) by mouth 2 (two) times daily.   pravastatin 40 MG tablet Commonly known as:  PRAVACHOL Take 40 mg by mouth daily.        Vitals:   04/19/18 1229  BP: 123/78  Pulse: 78  Resp: 18  Temp: 97.9 F (36.6 C)  TempSrc: Oral  SpO2: 93%  Weight: 168 lb 3.2 oz (76.3 kg)  Height: 5\' 6"  (1.676 m)   Body mass index is 27.15 kg/m.  Physical Exam  GENERAL APPEARANCE: Alert, conversant. No acute distress.  HEENT: Unremarkable. RESPIRATORY: Breathing is even, unlabored. Lung sounds are clear   CARDIOVASCULAR: Heart RRR no murmurs, rubs or gallops. No peripheral edema.  GASTROINTESTINAL: Abdomen is soft, non-tender, not distended w/ normal bowel sounds.  NEUROLOGIC: Cranial nerves 2-12 grossly intact. Moves all extremities   Labs reviewed: Basic Metabolic Panel: Recent Labs    03/23/18 0524 03/24/18 0541 03/25/18 0530 04/03/18  NA 137 138 139 139  K 3.8 3.9 3.9 3.6  CL 102 105 103  --   CO2 26 26 28   --   GLUCOSE 109* 109* 105*  --   BUN 11 11 12 13   CREATININE 0.71 0.64 0.58* 0.7  CALCIUM 8.3* 7.9* 7.9*  --    No results found for: [facility] Liver Function Tests: Recent Labs    02/28/18 0511 03/01/18 0456 03/22/18 1606  AST 11* 19 15  ALT 11* 16* 11*  ALKPHOS 31* 33* 47  BILITOT 0.5 0.4 1.3*  PROT 5.5* 5.6* 7.3  ALBUMIN 2.5* 2.6* 3.8   Recent Labs    03/22/18 1606  LIPASE 27   No results for input(s): AMMONIA in the last 8760 hours. CBC: Recent Labs    03/22/18 1606 03/23/18 0524 03/24/18 0541 03/25/18 0530  WBC 15.3* 9.3 6.6 6.2  NEUTROABS 13.1*  --  4.8 4.4  HGB 14.5 11.8* 11.6* 11.0*  HCT 44.3 36.7* 36.6* 34.8*  MCV 94.1 95.1 95.3 94.8  PLT 216 184 158 167   Lipid No results  for input(s): CHOL, HDL, LDLCALC, TRIG in the last 8760 hours. Cardiac Enzymes: No results for input(s): CKTOTAL, CKMB, CKMBINDEX, TROPONINI in the last 8760 hours. BNP: Recent Labs    03/22/18 1606  BNP 39.0   CBG: Recent Labs    03/26/18 0359  03/26/18 0737 03/26/18 2226  GLUCAP 87 93 127*    Procedures and Imaging Studies During Stay: No results found.  Assessment/Plan:   Barrett's esophagus with dysplasia  Candida esophagitis (HCC)  Intractable vomiting with nausea, unspecified vomiting type  Pseudomonas urinary tract infection  Depression, unspecified depression type  Acute metabolic encephalopathy  Dehydration  Dementia without behavioral disturbance, unspecified dementia type  Anxiety  Dyslipidemia   Patient is being discharged with the following home health services:  OT/PT/Nursing  Patient is being discharged with the following durable medical equipment: Wheelchair   Patient has been advised to f/u with their PCP in 1-2 weeks to bring them up to date on their rehab stay.  Social services at facility was responsible for arranging this appointment.  Pt was provided with a 30 day supply of prescriptions for medications and refills must be obtained from their PCP.  For controlled substances, a more limited supply may be provided adequate until PCP appointment only.  Time spent greater than 30 minutes;> 50% of time with patient was spent reviewing records, labs, tests and studies, counseling and developing plan of care   Noah Delaine. Sheppard Coil, MD

## 2018-04-20 ENCOUNTER — Encounter: Payer: Self-pay | Admitting: Internal Medicine

## 2018-04-22 DIAGNOSIS — F0390 Unspecified dementia without behavioral disturbance: Secondary | ICD-10-CM | POA: Diagnosis not present

## 2018-04-22 DIAGNOSIS — I700 Atherosclerosis of aorta: Secondary | ICD-10-CM | POA: Diagnosis not present

## 2018-04-22 DIAGNOSIS — R0782 Intercostal pain: Secondary | ICD-10-CM | POA: Diagnosis not present

## 2018-04-22 DIAGNOSIS — M6281 Muscle weakness (generalized): Secondary | ICD-10-CM | POA: Diagnosis not present

## 2018-04-22 DIAGNOSIS — R2681 Unsteadiness on feet: Secondary | ICD-10-CM | POA: Diagnosis not present

## 2018-04-22 DIAGNOSIS — R296 Repeated falls: Secondary | ICD-10-CM | POA: Diagnosis not present

## 2018-04-22 DIAGNOSIS — R1312 Dysphagia, oropharyngeal phase: Secondary | ICD-10-CM | POA: Diagnosis not present

## 2018-04-22 DIAGNOSIS — M79604 Pain in right leg: Secondary | ICD-10-CM | POA: Diagnosis not present

## 2018-04-22 DIAGNOSIS — Z87440 Personal history of urinary (tract) infections: Secondary | ICD-10-CM | POA: Diagnosis not present

## 2018-04-24 DIAGNOSIS — R52 Pain, unspecified: Secondary | ICD-10-CM | POA: Diagnosis not present

## 2018-04-26 DIAGNOSIS — R1312 Dysphagia, oropharyngeal phase: Secondary | ICD-10-CM | POA: Diagnosis not present

## 2018-04-26 DIAGNOSIS — R296 Repeated falls: Secondary | ICD-10-CM | POA: Diagnosis not present

## 2018-04-26 DIAGNOSIS — M79604 Pain in right leg: Secondary | ICD-10-CM | POA: Diagnosis not present

## 2018-04-26 DIAGNOSIS — M6281 Muscle weakness (generalized): Secondary | ICD-10-CM | POA: Diagnosis not present

## 2018-04-26 DIAGNOSIS — F0390 Unspecified dementia without behavioral disturbance: Secondary | ICD-10-CM | POA: Diagnosis not present

## 2018-04-26 DIAGNOSIS — R0782 Intercostal pain: Secondary | ICD-10-CM | POA: Diagnosis not present

## 2018-04-28 DIAGNOSIS — M6281 Muscle weakness (generalized): Secondary | ICD-10-CM | POA: Diagnosis not present

## 2018-04-28 DIAGNOSIS — R296 Repeated falls: Secondary | ICD-10-CM | POA: Diagnosis not present

## 2018-04-28 DIAGNOSIS — R0782 Intercostal pain: Secondary | ICD-10-CM | POA: Diagnosis not present

## 2018-04-28 DIAGNOSIS — F0390 Unspecified dementia without behavioral disturbance: Secondary | ICD-10-CM | POA: Diagnosis not present

## 2018-04-28 DIAGNOSIS — M79604 Pain in right leg: Secondary | ICD-10-CM | POA: Diagnosis not present

## 2018-04-28 DIAGNOSIS — R1312 Dysphagia, oropharyngeal phase: Secondary | ICD-10-CM | POA: Diagnosis not present

## 2018-04-29 DIAGNOSIS — R0782 Intercostal pain: Secondary | ICD-10-CM | POA: Diagnosis not present

## 2018-04-29 DIAGNOSIS — M6281 Muscle weakness (generalized): Secondary | ICD-10-CM | POA: Diagnosis not present

## 2018-04-29 DIAGNOSIS — R1312 Dysphagia, oropharyngeal phase: Secondary | ICD-10-CM | POA: Diagnosis not present

## 2018-04-29 DIAGNOSIS — M79604 Pain in right leg: Secondary | ICD-10-CM | POA: Diagnosis not present

## 2018-04-29 DIAGNOSIS — F0390 Unspecified dementia without behavioral disturbance: Secondary | ICD-10-CM | POA: Diagnosis not present

## 2018-04-29 DIAGNOSIS — R296 Repeated falls: Secondary | ICD-10-CM | POA: Diagnosis not present

## 2018-04-30 ENCOUNTER — Ambulatory Visit: Payer: Medicare Other | Admitting: Gastroenterology

## 2018-05-03 DIAGNOSIS — J449 Chronic obstructive pulmonary disease, unspecified: Secondary | ICD-10-CM | POA: Diagnosis not present

## 2018-05-03 DIAGNOSIS — Z741 Need for assistance with personal care: Secondary | ICD-10-CM | POA: Diagnosis not present

## 2018-05-03 DIAGNOSIS — I209 Angina pectoris, unspecified: Secondary | ICD-10-CM | POA: Diagnosis not present

## 2018-05-03 DIAGNOSIS — R2689 Other abnormalities of gait and mobility: Secondary | ICD-10-CM | POA: Diagnosis not present

## 2018-05-03 DIAGNOSIS — R41841 Cognitive communication deficit: Secondary | ICD-10-CM | POA: Diagnosis not present

## 2018-05-03 DIAGNOSIS — R1311 Dysphagia, oral phase: Secondary | ICD-10-CM | POA: Diagnosis not present

## 2018-05-03 DIAGNOSIS — M810 Age-related osteoporosis without current pathological fracture: Secondary | ICD-10-CM | POA: Diagnosis not present

## 2018-05-03 DIAGNOSIS — I6389 Other cerebral infarction: Secondary | ICD-10-CM | POA: Diagnosis not present

## 2018-05-03 DIAGNOSIS — R5381 Other malaise: Secondary | ICD-10-CM | POA: Diagnosis not present

## 2018-05-03 DIAGNOSIS — M6281 Muscle weakness (generalized): Secondary | ICD-10-CM | POA: Diagnosis not present

## 2018-05-03 DIAGNOSIS — J984 Other disorders of lung: Secondary | ICD-10-CM | POA: Diagnosis not present

## 2018-05-03 DIAGNOSIS — K219 Gastro-esophageal reflux disease without esophagitis: Secondary | ICD-10-CM | POA: Diagnosis not present

## 2018-05-04 DIAGNOSIS — I209 Angina pectoris, unspecified: Secondary | ICD-10-CM | POA: Diagnosis not present

## 2018-05-04 DIAGNOSIS — M6281 Muscle weakness (generalized): Secondary | ICD-10-CM | POA: Diagnosis not present

## 2018-05-04 DIAGNOSIS — Z741 Need for assistance with personal care: Secondary | ICD-10-CM | POA: Diagnosis not present

## 2018-05-04 DIAGNOSIS — R2689 Other abnormalities of gait and mobility: Secondary | ICD-10-CM | POA: Diagnosis not present

## 2018-05-04 DIAGNOSIS — R1311 Dysphagia, oral phase: Secondary | ICD-10-CM | POA: Diagnosis not present

## 2018-05-04 DIAGNOSIS — J449 Chronic obstructive pulmonary disease, unspecified: Secondary | ICD-10-CM | POA: Diagnosis not present

## 2018-05-05 DIAGNOSIS — R2689 Other abnormalities of gait and mobility: Secondary | ICD-10-CM | POA: Diagnosis not present

## 2018-05-05 DIAGNOSIS — R319 Hematuria, unspecified: Secondary | ICD-10-CM | POA: Diagnosis not present

## 2018-05-05 DIAGNOSIS — N390 Urinary tract infection, site not specified: Secondary | ICD-10-CM | POA: Diagnosis not present

## 2018-05-05 DIAGNOSIS — R5381 Other malaise: Secondary | ICD-10-CM | POA: Diagnosis not present

## 2018-05-05 DIAGNOSIS — J449 Chronic obstructive pulmonary disease, unspecified: Secondary | ICD-10-CM | POA: Diagnosis not present

## 2018-05-05 DIAGNOSIS — Z741 Need for assistance with personal care: Secondary | ICD-10-CM | POA: Diagnosis not present

## 2018-05-05 DIAGNOSIS — Z79899 Other long term (current) drug therapy: Secondary | ICD-10-CM | POA: Diagnosis not present

## 2018-05-05 DIAGNOSIS — M6281 Muscle weakness (generalized): Secondary | ICD-10-CM | POA: Diagnosis not present

## 2018-05-05 DIAGNOSIS — R1311 Dysphagia, oral phase: Secondary | ICD-10-CM | POA: Diagnosis not present

## 2018-05-05 DIAGNOSIS — I209 Angina pectoris, unspecified: Secondary | ICD-10-CM | POA: Diagnosis not present

## 2018-05-05 DIAGNOSIS — K219 Gastro-esophageal reflux disease without esophagitis: Secondary | ICD-10-CM | POA: Diagnosis not present

## 2018-05-06 DIAGNOSIS — M6281 Muscle weakness (generalized): Secondary | ICD-10-CM | POA: Diagnosis not present

## 2018-05-06 DIAGNOSIS — J449 Chronic obstructive pulmonary disease, unspecified: Secondary | ICD-10-CM | POA: Diagnosis not present

## 2018-05-06 DIAGNOSIS — R2689 Other abnormalities of gait and mobility: Secondary | ICD-10-CM | POA: Diagnosis not present

## 2018-05-06 DIAGNOSIS — I209 Angina pectoris, unspecified: Secondary | ICD-10-CM | POA: Diagnosis not present

## 2018-05-06 DIAGNOSIS — R1311 Dysphagia, oral phase: Secondary | ICD-10-CM | POA: Diagnosis not present

## 2018-05-06 DIAGNOSIS — Z741 Need for assistance with personal care: Secondary | ICD-10-CM | POA: Diagnosis not present

## 2018-05-07 DIAGNOSIS — J449 Chronic obstructive pulmonary disease, unspecified: Secondary | ICD-10-CM | POA: Diagnosis not present

## 2018-05-07 DIAGNOSIS — I209 Angina pectoris, unspecified: Secondary | ICD-10-CM | POA: Diagnosis not present

## 2018-05-07 DIAGNOSIS — R2689 Other abnormalities of gait and mobility: Secondary | ICD-10-CM | POA: Diagnosis not present

## 2018-05-07 DIAGNOSIS — Z741 Need for assistance with personal care: Secondary | ICD-10-CM | POA: Diagnosis not present

## 2018-05-07 DIAGNOSIS — M79641 Pain in right hand: Secondary | ICD-10-CM | POA: Diagnosis not present

## 2018-05-07 DIAGNOSIS — M810 Age-related osteoporosis without current pathological fracture: Secondary | ICD-10-CM | POA: Diagnosis not present

## 2018-05-07 DIAGNOSIS — R1311 Dysphagia, oral phase: Secondary | ICD-10-CM | POA: Diagnosis not present

## 2018-05-07 DIAGNOSIS — M6281 Muscle weakness (generalized): Secondary | ICD-10-CM | POA: Diagnosis not present

## 2018-05-07 DIAGNOSIS — M25531 Pain in right wrist: Secondary | ICD-10-CM | POA: Diagnosis not present

## 2018-05-08 DIAGNOSIS — I209 Angina pectoris, unspecified: Secondary | ICD-10-CM | POA: Diagnosis not present

## 2018-05-08 DIAGNOSIS — J449 Chronic obstructive pulmonary disease, unspecified: Secondary | ICD-10-CM | POA: Diagnosis not present

## 2018-05-08 DIAGNOSIS — Z741 Need for assistance with personal care: Secondary | ICD-10-CM | POA: Diagnosis not present

## 2018-05-08 DIAGNOSIS — R2689 Other abnormalities of gait and mobility: Secondary | ICD-10-CM | POA: Diagnosis not present

## 2018-05-08 DIAGNOSIS — Z79899 Other long term (current) drug therapy: Secondary | ICD-10-CM | POA: Diagnosis not present

## 2018-05-08 DIAGNOSIS — R1311 Dysphagia, oral phase: Secondary | ICD-10-CM | POA: Diagnosis not present

## 2018-05-08 DIAGNOSIS — M6281 Muscle weakness (generalized): Secondary | ICD-10-CM | POA: Diagnosis not present

## 2018-05-09 DIAGNOSIS — R2689 Other abnormalities of gait and mobility: Secondary | ICD-10-CM | POA: Diagnosis not present

## 2018-05-09 DIAGNOSIS — I209 Angina pectoris, unspecified: Secondary | ICD-10-CM | POA: Diagnosis not present

## 2018-05-09 DIAGNOSIS — M6281 Muscle weakness (generalized): Secondary | ICD-10-CM | POA: Diagnosis not present

## 2018-05-09 DIAGNOSIS — Z741 Need for assistance with personal care: Secondary | ICD-10-CM | POA: Diagnosis not present

## 2018-05-09 DIAGNOSIS — R1311 Dysphagia, oral phase: Secondary | ICD-10-CM | POA: Diagnosis not present

## 2018-05-09 DIAGNOSIS — J449 Chronic obstructive pulmonary disease, unspecified: Secondary | ICD-10-CM | POA: Diagnosis not present

## 2018-05-10 DIAGNOSIS — I209 Angina pectoris, unspecified: Secondary | ICD-10-CM | POA: Diagnosis not present

## 2018-05-10 DIAGNOSIS — M6281 Muscle weakness (generalized): Secondary | ICD-10-CM | POA: Diagnosis not present

## 2018-05-10 DIAGNOSIS — R1311 Dysphagia, oral phase: Secondary | ICD-10-CM | POA: Diagnosis not present

## 2018-05-10 DIAGNOSIS — J449 Chronic obstructive pulmonary disease, unspecified: Secondary | ICD-10-CM | POA: Diagnosis not present

## 2018-05-10 DIAGNOSIS — Z741 Need for assistance with personal care: Secondary | ICD-10-CM | POA: Diagnosis not present

## 2018-05-10 DIAGNOSIS — R2689 Other abnormalities of gait and mobility: Secondary | ICD-10-CM | POA: Diagnosis not present

## 2018-05-11 DIAGNOSIS — R2689 Other abnormalities of gait and mobility: Secondary | ICD-10-CM | POA: Diagnosis not present

## 2018-05-11 DIAGNOSIS — R1311 Dysphagia, oral phase: Secondary | ICD-10-CM | POA: Diagnosis not present

## 2018-05-11 DIAGNOSIS — I209 Angina pectoris, unspecified: Secondary | ICD-10-CM | POA: Diagnosis not present

## 2018-05-11 DIAGNOSIS — J449 Chronic obstructive pulmonary disease, unspecified: Secondary | ICD-10-CM | POA: Diagnosis not present

## 2018-05-11 DIAGNOSIS — M6281 Muscle weakness (generalized): Secondary | ICD-10-CM | POA: Diagnosis not present

## 2018-05-11 DIAGNOSIS — Z741 Need for assistance with personal care: Secondary | ICD-10-CM | POA: Diagnosis not present

## 2018-05-12 DIAGNOSIS — J449 Chronic obstructive pulmonary disease, unspecified: Secondary | ICD-10-CM | POA: Diagnosis not present

## 2018-05-12 DIAGNOSIS — Z741 Need for assistance with personal care: Secondary | ICD-10-CM | POA: Diagnosis not present

## 2018-05-12 DIAGNOSIS — I209 Angina pectoris, unspecified: Secondary | ICD-10-CM | POA: Diagnosis not present

## 2018-05-12 DIAGNOSIS — R1311 Dysphagia, oral phase: Secondary | ICD-10-CM | POA: Diagnosis not present

## 2018-05-12 DIAGNOSIS — M6281 Muscle weakness (generalized): Secondary | ICD-10-CM | POA: Diagnosis not present

## 2018-05-12 DIAGNOSIS — R2689 Other abnormalities of gait and mobility: Secondary | ICD-10-CM | POA: Diagnosis not present

## 2018-05-13 DIAGNOSIS — M6281 Muscle weakness (generalized): Secondary | ICD-10-CM | POA: Diagnosis not present

## 2018-05-13 DIAGNOSIS — R1311 Dysphagia, oral phase: Secondary | ICD-10-CM | POA: Diagnosis not present

## 2018-05-13 DIAGNOSIS — R2689 Other abnormalities of gait and mobility: Secondary | ICD-10-CM | POA: Diagnosis not present

## 2018-05-13 DIAGNOSIS — I209 Angina pectoris, unspecified: Secondary | ICD-10-CM | POA: Diagnosis not present

## 2018-05-13 DIAGNOSIS — J449 Chronic obstructive pulmonary disease, unspecified: Secondary | ICD-10-CM | POA: Diagnosis not present

## 2018-05-13 DIAGNOSIS — Z741 Need for assistance with personal care: Secondary | ICD-10-CM | POA: Diagnosis not present

## 2018-05-14 DIAGNOSIS — I209 Angina pectoris, unspecified: Secondary | ICD-10-CM | POA: Diagnosis not present

## 2018-05-14 DIAGNOSIS — R1311 Dysphagia, oral phase: Secondary | ICD-10-CM | POA: Diagnosis not present

## 2018-05-14 DIAGNOSIS — M6281 Muscle weakness (generalized): Secondary | ICD-10-CM | POA: Diagnosis not present

## 2018-05-14 DIAGNOSIS — Z741 Need for assistance with personal care: Secondary | ICD-10-CM | POA: Diagnosis not present

## 2018-05-14 DIAGNOSIS — R2689 Other abnormalities of gait and mobility: Secondary | ICD-10-CM | POA: Diagnosis not present

## 2018-05-14 DIAGNOSIS — J449 Chronic obstructive pulmonary disease, unspecified: Secondary | ICD-10-CM | POA: Diagnosis not present

## 2018-05-15 DIAGNOSIS — Z741 Need for assistance with personal care: Secondary | ICD-10-CM | POA: Diagnosis not present

## 2018-05-15 DIAGNOSIS — R1311 Dysphagia, oral phase: Secondary | ICD-10-CM | POA: Diagnosis not present

## 2018-05-15 DIAGNOSIS — R2689 Other abnormalities of gait and mobility: Secondary | ICD-10-CM | POA: Diagnosis not present

## 2018-05-15 DIAGNOSIS — I209 Angina pectoris, unspecified: Secondary | ICD-10-CM | POA: Diagnosis not present

## 2018-05-15 DIAGNOSIS — J449 Chronic obstructive pulmonary disease, unspecified: Secondary | ICD-10-CM | POA: Diagnosis not present

## 2018-05-15 DIAGNOSIS — M6281 Muscle weakness (generalized): Secondary | ICD-10-CM | POA: Diagnosis not present

## 2018-05-17 DIAGNOSIS — M6281 Muscle weakness (generalized): Secondary | ICD-10-CM | POA: Diagnosis not present

## 2018-05-17 DIAGNOSIS — R2689 Other abnormalities of gait and mobility: Secondary | ICD-10-CM | POA: Diagnosis not present

## 2018-05-17 DIAGNOSIS — J449 Chronic obstructive pulmonary disease, unspecified: Secondary | ICD-10-CM | POA: Diagnosis not present

## 2018-05-17 DIAGNOSIS — I209 Angina pectoris, unspecified: Secondary | ICD-10-CM | POA: Diagnosis not present

## 2018-05-17 DIAGNOSIS — Z741 Need for assistance with personal care: Secondary | ICD-10-CM | POA: Diagnosis not present

## 2018-05-17 DIAGNOSIS — R1311 Dysphagia, oral phase: Secondary | ICD-10-CM | POA: Diagnosis not present

## 2018-05-18 DIAGNOSIS — R1311 Dysphagia, oral phase: Secondary | ICD-10-CM | POA: Diagnosis not present

## 2018-05-18 DIAGNOSIS — R2689 Other abnormalities of gait and mobility: Secondary | ICD-10-CM | POA: Diagnosis not present

## 2018-05-18 DIAGNOSIS — M6281 Muscle weakness (generalized): Secondary | ICD-10-CM | POA: Diagnosis not present

## 2018-05-18 DIAGNOSIS — I209 Angina pectoris, unspecified: Secondary | ICD-10-CM | POA: Diagnosis not present

## 2018-05-18 DIAGNOSIS — J449 Chronic obstructive pulmonary disease, unspecified: Secondary | ICD-10-CM | POA: Diagnosis not present

## 2018-05-18 DIAGNOSIS — Z741 Need for assistance with personal care: Secondary | ICD-10-CM | POA: Diagnosis not present

## 2018-05-19 DIAGNOSIS — I209 Angina pectoris, unspecified: Secondary | ICD-10-CM | POA: Diagnosis not present

## 2018-05-19 DIAGNOSIS — R1311 Dysphagia, oral phase: Secondary | ICD-10-CM | POA: Diagnosis not present

## 2018-05-19 DIAGNOSIS — M6281 Muscle weakness (generalized): Secondary | ICD-10-CM | POA: Diagnosis not present

## 2018-05-19 DIAGNOSIS — Z741 Need for assistance with personal care: Secondary | ICD-10-CM | POA: Diagnosis not present

## 2018-05-19 DIAGNOSIS — R2689 Other abnormalities of gait and mobility: Secondary | ICD-10-CM | POA: Diagnosis not present

## 2018-05-19 DIAGNOSIS — J449 Chronic obstructive pulmonary disease, unspecified: Secondary | ICD-10-CM | POA: Diagnosis not present

## 2018-05-20 DIAGNOSIS — I209 Angina pectoris, unspecified: Secondary | ICD-10-CM | POA: Diagnosis not present

## 2018-05-20 DIAGNOSIS — Z79899 Other long term (current) drug therapy: Secondary | ICD-10-CM | POA: Diagnosis not present

## 2018-05-20 DIAGNOSIS — J449 Chronic obstructive pulmonary disease, unspecified: Secondary | ICD-10-CM | POA: Diagnosis not present

## 2018-05-20 DIAGNOSIS — M6281 Muscle weakness (generalized): Secondary | ICD-10-CM | POA: Diagnosis not present

## 2018-05-20 DIAGNOSIS — R2689 Other abnormalities of gait and mobility: Secondary | ICD-10-CM | POA: Diagnosis not present

## 2018-05-20 DIAGNOSIS — E559 Vitamin D deficiency, unspecified: Secondary | ICD-10-CM | POA: Diagnosis not present

## 2018-05-20 DIAGNOSIS — Z741 Need for assistance with personal care: Secondary | ICD-10-CM | POA: Diagnosis not present

## 2018-05-20 DIAGNOSIS — E785 Hyperlipidemia, unspecified: Secondary | ICD-10-CM | POA: Diagnosis not present

## 2018-05-20 DIAGNOSIS — R1311 Dysphagia, oral phase: Secondary | ICD-10-CM | POA: Diagnosis not present

## 2018-05-21 DIAGNOSIS — Z741 Need for assistance with personal care: Secondary | ICD-10-CM | POA: Diagnosis not present

## 2018-05-21 DIAGNOSIS — I209 Angina pectoris, unspecified: Secondary | ICD-10-CM | POA: Diagnosis not present

## 2018-05-21 DIAGNOSIS — M6281 Muscle weakness (generalized): Secondary | ICD-10-CM | POA: Diagnosis not present

## 2018-05-21 DIAGNOSIS — R2689 Other abnormalities of gait and mobility: Secondary | ICD-10-CM | POA: Diagnosis not present

## 2018-05-21 DIAGNOSIS — J449 Chronic obstructive pulmonary disease, unspecified: Secondary | ICD-10-CM | POA: Diagnosis not present

## 2018-05-21 DIAGNOSIS — R1311 Dysphagia, oral phase: Secondary | ICD-10-CM | POA: Diagnosis not present

## 2018-05-21 DIAGNOSIS — Z23 Encounter for immunization: Secondary | ICD-10-CM | POA: Diagnosis not present

## 2018-05-24 DIAGNOSIS — R2689 Other abnormalities of gait and mobility: Secondary | ICD-10-CM | POA: Diagnosis not present

## 2018-05-24 DIAGNOSIS — J449 Chronic obstructive pulmonary disease, unspecified: Secondary | ICD-10-CM | POA: Diagnosis not present

## 2018-05-24 DIAGNOSIS — Z741 Need for assistance with personal care: Secondary | ICD-10-CM | POA: Diagnosis not present

## 2018-05-24 DIAGNOSIS — I209 Angina pectoris, unspecified: Secondary | ICD-10-CM | POA: Diagnosis not present

## 2018-05-24 DIAGNOSIS — M6281 Muscle weakness (generalized): Secondary | ICD-10-CM | POA: Diagnosis not present

## 2018-05-24 DIAGNOSIS — R1311 Dysphagia, oral phase: Secondary | ICD-10-CM | POA: Diagnosis not present

## 2018-05-25 DIAGNOSIS — R2689 Other abnormalities of gait and mobility: Secondary | ICD-10-CM | POA: Diagnosis not present

## 2018-05-25 DIAGNOSIS — Z741 Need for assistance with personal care: Secondary | ICD-10-CM | POA: Diagnosis not present

## 2018-05-25 DIAGNOSIS — M6281 Muscle weakness (generalized): Secondary | ICD-10-CM | POA: Diagnosis not present

## 2018-05-25 DIAGNOSIS — I209 Angina pectoris, unspecified: Secondary | ICD-10-CM | POA: Diagnosis not present

## 2018-05-25 DIAGNOSIS — J449 Chronic obstructive pulmonary disease, unspecified: Secondary | ICD-10-CM | POA: Diagnosis not present

## 2018-05-25 DIAGNOSIS — R1311 Dysphagia, oral phase: Secondary | ICD-10-CM | POA: Diagnosis not present

## 2018-05-26 DIAGNOSIS — R1311 Dysphagia, oral phase: Secondary | ICD-10-CM | POA: Diagnosis not present

## 2018-05-26 DIAGNOSIS — J449 Chronic obstructive pulmonary disease, unspecified: Secondary | ICD-10-CM | POA: Diagnosis not present

## 2018-05-26 DIAGNOSIS — M6281 Muscle weakness (generalized): Secondary | ICD-10-CM | POA: Diagnosis not present

## 2018-05-26 DIAGNOSIS — I209 Angina pectoris, unspecified: Secondary | ICD-10-CM | POA: Diagnosis not present

## 2018-05-26 DIAGNOSIS — Z741 Need for assistance with personal care: Secondary | ICD-10-CM | POA: Diagnosis not present

## 2018-05-26 DIAGNOSIS — R2689 Other abnormalities of gait and mobility: Secondary | ICD-10-CM | POA: Diagnosis not present

## 2018-05-27 DIAGNOSIS — R1311 Dysphagia, oral phase: Secondary | ICD-10-CM | POA: Diagnosis not present

## 2018-05-27 DIAGNOSIS — M6281 Muscle weakness (generalized): Secondary | ICD-10-CM | POA: Diagnosis not present

## 2018-05-27 DIAGNOSIS — B351 Tinea unguium: Secondary | ICD-10-CM | POA: Diagnosis not present

## 2018-05-27 DIAGNOSIS — M79674 Pain in right toe(s): Secondary | ICD-10-CM | POA: Diagnosis not present

## 2018-05-27 DIAGNOSIS — Z741 Need for assistance with personal care: Secondary | ICD-10-CM | POA: Diagnosis not present

## 2018-05-27 DIAGNOSIS — R2689 Other abnormalities of gait and mobility: Secondary | ICD-10-CM | POA: Diagnosis not present

## 2018-05-27 DIAGNOSIS — M79675 Pain in left toe(s): Secondary | ICD-10-CM | POA: Diagnosis not present

## 2018-05-27 DIAGNOSIS — I209 Angina pectoris, unspecified: Secondary | ICD-10-CM | POA: Diagnosis not present

## 2018-05-27 DIAGNOSIS — J449 Chronic obstructive pulmonary disease, unspecified: Secondary | ICD-10-CM | POA: Diagnosis not present

## 2018-05-28 DIAGNOSIS — M6281 Muscle weakness (generalized): Secondary | ICD-10-CM | POA: Diagnosis not present

## 2018-05-28 DIAGNOSIS — R1311 Dysphagia, oral phase: Secondary | ICD-10-CM | POA: Diagnosis not present

## 2018-05-28 DIAGNOSIS — R2689 Other abnormalities of gait and mobility: Secondary | ICD-10-CM | POA: Diagnosis not present

## 2018-05-28 DIAGNOSIS — J449 Chronic obstructive pulmonary disease, unspecified: Secondary | ICD-10-CM | POA: Diagnosis not present

## 2018-05-28 DIAGNOSIS — I209 Angina pectoris, unspecified: Secondary | ICD-10-CM | POA: Diagnosis not present

## 2018-05-28 DIAGNOSIS — Z741 Need for assistance with personal care: Secondary | ICD-10-CM | POA: Diagnosis not present

## 2018-05-31 DIAGNOSIS — R2689 Other abnormalities of gait and mobility: Secondary | ICD-10-CM | POA: Diagnosis not present

## 2018-05-31 DIAGNOSIS — Z741 Need for assistance with personal care: Secondary | ICD-10-CM | POA: Diagnosis not present

## 2018-05-31 DIAGNOSIS — I209 Angina pectoris, unspecified: Secondary | ICD-10-CM | POA: Diagnosis not present

## 2018-05-31 DIAGNOSIS — M6281 Muscle weakness (generalized): Secondary | ICD-10-CM | POA: Diagnosis not present

## 2018-05-31 DIAGNOSIS — R1311 Dysphagia, oral phase: Secondary | ICD-10-CM | POA: Diagnosis not present

## 2018-05-31 DIAGNOSIS — J449 Chronic obstructive pulmonary disease, unspecified: Secondary | ICD-10-CM | POA: Diagnosis not present

## 2018-06-01 DIAGNOSIS — I209 Angina pectoris, unspecified: Secondary | ICD-10-CM | POA: Diagnosis not present

## 2018-06-01 DIAGNOSIS — M6281 Muscle weakness (generalized): Secondary | ICD-10-CM | POA: Diagnosis not present

## 2018-06-01 DIAGNOSIS — R2689 Other abnormalities of gait and mobility: Secondary | ICD-10-CM | POA: Diagnosis not present

## 2018-06-01 DIAGNOSIS — J449 Chronic obstructive pulmonary disease, unspecified: Secondary | ICD-10-CM | POA: Diagnosis not present

## 2018-06-01 DIAGNOSIS — R1311 Dysphagia, oral phase: Secondary | ICD-10-CM | POA: Diagnosis not present

## 2018-06-01 DIAGNOSIS — Z741 Need for assistance with personal care: Secondary | ICD-10-CM | POA: Diagnosis not present

## 2018-06-02 DIAGNOSIS — Z741 Need for assistance with personal care: Secondary | ICD-10-CM | POA: Diagnosis not present

## 2018-06-02 DIAGNOSIS — J449 Chronic obstructive pulmonary disease, unspecified: Secondary | ICD-10-CM | POA: Diagnosis not present

## 2018-06-02 DIAGNOSIS — M6281 Muscle weakness (generalized): Secondary | ICD-10-CM | POA: Diagnosis not present

## 2018-06-02 DIAGNOSIS — R2689 Other abnormalities of gait and mobility: Secondary | ICD-10-CM | POA: Diagnosis not present

## 2018-06-02 DIAGNOSIS — R1311 Dysphagia, oral phase: Secondary | ICD-10-CM | POA: Diagnosis not present

## 2018-06-02 DIAGNOSIS — I209 Angina pectoris, unspecified: Secondary | ICD-10-CM | POA: Diagnosis not present

## 2018-06-03 DIAGNOSIS — R2689 Other abnormalities of gait and mobility: Secondary | ICD-10-CM | POA: Diagnosis not present

## 2018-06-03 DIAGNOSIS — M6281 Muscle weakness (generalized): Secondary | ICD-10-CM | POA: Diagnosis not present

## 2018-06-03 DIAGNOSIS — J449 Chronic obstructive pulmonary disease, unspecified: Secondary | ICD-10-CM | POA: Diagnosis not present

## 2018-06-03 DIAGNOSIS — I209 Angina pectoris, unspecified: Secondary | ICD-10-CM | POA: Diagnosis not present

## 2018-06-03 DIAGNOSIS — M810 Age-related osteoporosis without current pathological fracture: Secondary | ICD-10-CM | POA: Diagnosis not present

## 2018-06-03 DIAGNOSIS — R41841 Cognitive communication deficit: Secondary | ICD-10-CM | POA: Diagnosis not present

## 2018-06-03 DIAGNOSIS — Z741 Need for assistance with personal care: Secondary | ICD-10-CM | POA: Diagnosis not present

## 2018-06-03 DIAGNOSIS — R1311 Dysphagia, oral phase: Secondary | ICD-10-CM | POA: Diagnosis not present

## 2018-06-04 DIAGNOSIS — M6281 Muscle weakness (generalized): Secondary | ICD-10-CM | POA: Diagnosis not present

## 2018-06-04 DIAGNOSIS — I6389 Other cerebral infarction: Secondary | ICD-10-CM | POA: Diagnosis not present

## 2018-06-04 DIAGNOSIS — R1311 Dysphagia, oral phase: Secondary | ICD-10-CM | POA: Diagnosis not present

## 2018-06-04 DIAGNOSIS — F418 Other specified anxiety disorders: Secondary | ICD-10-CM | POA: Diagnosis not present

## 2018-06-04 DIAGNOSIS — Z741 Need for assistance with personal care: Secondary | ICD-10-CM | POA: Diagnosis not present

## 2018-06-04 DIAGNOSIS — R2689 Other abnormalities of gait and mobility: Secondary | ICD-10-CM | POA: Diagnosis not present

## 2018-06-04 DIAGNOSIS — E7849 Other hyperlipidemia: Secondary | ICD-10-CM | POA: Diagnosis not present

## 2018-06-04 DIAGNOSIS — J449 Chronic obstructive pulmonary disease, unspecified: Secondary | ICD-10-CM | POA: Diagnosis not present

## 2018-06-04 DIAGNOSIS — F320 Major depressive disorder, single episode, mild: Secondary | ICD-10-CM | POA: Diagnosis not present

## 2018-06-04 DIAGNOSIS — I209 Angina pectoris, unspecified: Secondary | ICD-10-CM | POA: Diagnosis not present

## 2018-06-05 DIAGNOSIS — M6281 Muscle weakness (generalized): Secondary | ICD-10-CM | POA: Diagnosis not present

## 2018-06-05 DIAGNOSIS — R1311 Dysphagia, oral phase: Secondary | ICD-10-CM | POA: Diagnosis not present

## 2018-06-05 DIAGNOSIS — Z741 Need for assistance with personal care: Secondary | ICD-10-CM | POA: Diagnosis not present

## 2018-06-05 DIAGNOSIS — J449 Chronic obstructive pulmonary disease, unspecified: Secondary | ICD-10-CM | POA: Diagnosis not present

## 2018-06-05 DIAGNOSIS — I209 Angina pectoris, unspecified: Secondary | ICD-10-CM | POA: Diagnosis not present

## 2018-06-05 DIAGNOSIS — R2689 Other abnormalities of gait and mobility: Secondary | ICD-10-CM | POA: Diagnosis not present

## 2018-06-06 DIAGNOSIS — J449 Chronic obstructive pulmonary disease, unspecified: Secondary | ICD-10-CM | POA: Diagnosis not present

## 2018-06-06 DIAGNOSIS — I209 Angina pectoris, unspecified: Secondary | ICD-10-CM | POA: Diagnosis not present

## 2018-06-06 DIAGNOSIS — R2689 Other abnormalities of gait and mobility: Secondary | ICD-10-CM | POA: Diagnosis not present

## 2018-06-06 DIAGNOSIS — R1311 Dysphagia, oral phase: Secondary | ICD-10-CM | POA: Diagnosis not present

## 2018-06-06 DIAGNOSIS — Z741 Need for assistance with personal care: Secondary | ICD-10-CM | POA: Diagnosis not present

## 2018-06-06 DIAGNOSIS — M6281 Muscle weakness (generalized): Secondary | ICD-10-CM | POA: Diagnosis not present

## 2018-06-07 DIAGNOSIS — M6281 Muscle weakness (generalized): Secondary | ICD-10-CM | POA: Diagnosis not present

## 2018-06-07 DIAGNOSIS — Z741 Need for assistance with personal care: Secondary | ICD-10-CM | POA: Diagnosis not present

## 2018-06-07 DIAGNOSIS — R2689 Other abnormalities of gait and mobility: Secondary | ICD-10-CM | POA: Diagnosis not present

## 2018-06-07 DIAGNOSIS — R1311 Dysphagia, oral phase: Secondary | ICD-10-CM | POA: Diagnosis not present

## 2018-06-07 DIAGNOSIS — I209 Angina pectoris, unspecified: Secondary | ICD-10-CM | POA: Diagnosis not present

## 2018-06-07 DIAGNOSIS — J449 Chronic obstructive pulmonary disease, unspecified: Secondary | ICD-10-CM | POA: Diagnosis not present

## 2018-06-08 DIAGNOSIS — R1311 Dysphagia, oral phase: Secondary | ICD-10-CM | POA: Diagnosis not present

## 2018-06-08 DIAGNOSIS — R2689 Other abnormalities of gait and mobility: Secondary | ICD-10-CM | POA: Diagnosis not present

## 2018-06-08 DIAGNOSIS — J449 Chronic obstructive pulmonary disease, unspecified: Secondary | ICD-10-CM | POA: Diagnosis not present

## 2018-06-08 DIAGNOSIS — Z741 Need for assistance with personal care: Secondary | ICD-10-CM | POA: Diagnosis not present

## 2018-06-08 DIAGNOSIS — M6281 Muscle weakness (generalized): Secondary | ICD-10-CM | POA: Diagnosis not present

## 2018-06-08 DIAGNOSIS — I209 Angina pectoris, unspecified: Secondary | ICD-10-CM | POA: Diagnosis not present

## 2018-06-09 DIAGNOSIS — J449 Chronic obstructive pulmonary disease, unspecified: Secondary | ICD-10-CM | POA: Diagnosis not present

## 2018-06-09 DIAGNOSIS — Z741 Need for assistance with personal care: Secondary | ICD-10-CM | POA: Diagnosis not present

## 2018-06-09 DIAGNOSIS — R2689 Other abnormalities of gait and mobility: Secondary | ICD-10-CM | POA: Diagnosis not present

## 2018-06-09 DIAGNOSIS — M6281 Muscle weakness (generalized): Secondary | ICD-10-CM | POA: Diagnosis not present

## 2018-06-09 DIAGNOSIS — I209 Angina pectoris, unspecified: Secondary | ICD-10-CM | POA: Diagnosis not present

## 2018-06-09 DIAGNOSIS — R1311 Dysphagia, oral phase: Secondary | ICD-10-CM | POA: Diagnosis not present

## 2018-06-10 DIAGNOSIS — Z741 Need for assistance with personal care: Secondary | ICD-10-CM | POA: Diagnosis not present

## 2018-06-10 DIAGNOSIS — M6281 Muscle weakness (generalized): Secondary | ICD-10-CM | POA: Diagnosis not present

## 2018-06-10 DIAGNOSIS — I209 Angina pectoris, unspecified: Secondary | ICD-10-CM | POA: Diagnosis not present

## 2018-06-10 DIAGNOSIS — J449 Chronic obstructive pulmonary disease, unspecified: Secondary | ICD-10-CM | POA: Diagnosis not present

## 2018-06-10 DIAGNOSIS — R2689 Other abnormalities of gait and mobility: Secondary | ICD-10-CM | POA: Diagnosis not present

## 2018-06-10 DIAGNOSIS — R1311 Dysphagia, oral phase: Secondary | ICD-10-CM | POA: Diagnosis not present

## 2018-06-11 DIAGNOSIS — J449 Chronic obstructive pulmonary disease, unspecified: Secondary | ICD-10-CM | POA: Diagnosis not present

## 2018-06-11 DIAGNOSIS — Z741 Need for assistance with personal care: Secondary | ICD-10-CM | POA: Diagnosis not present

## 2018-06-11 DIAGNOSIS — I209 Angina pectoris, unspecified: Secondary | ICD-10-CM | POA: Diagnosis not present

## 2018-06-11 DIAGNOSIS — R2689 Other abnormalities of gait and mobility: Secondary | ICD-10-CM | POA: Diagnosis not present

## 2018-06-11 DIAGNOSIS — R1311 Dysphagia, oral phase: Secondary | ICD-10-CM | POA: Diagnosis not present

## 2018-06-11 DIAGNOSIS — M6281 Muscle weakness (generalized): Secondary | ICD-10-CM | POA: Diagnosis not present

## 2018-06-13 DIAGNOSIS — R1311 Dysphagia, oral phase: Secondary | ICD-10-CM | POA: Diagnosis not present

## 2018-06-13 DIAGNOSIS — M6281 Muscle weakness (generalized): Secondary | ICD-10-CM | POA: Diagnosis not present

## 2018-06-13 DIAGNOSIS — J449 Chronic obstructive pulmonary disease, unspecified: Secondary | ICD-10-CM | POA: Diagnosis not present

## 2018-06-13 DIAGNOSIS — R2689 Other abnormalities of gait and mobility: Secondary | ICD-10-CM | POA: Diagnosis not present

## 2018-06-13 DIAGNOSIS — I209 Angina pectoris, unspecified: Secondary | ICD-10-CM | POA: Diagnosis not present

## 2018-06-13 DIAGNOSIS — Z741 Need for assistance with personal care: Secondary | ICD-10-CM | POA: Diagnosis not present

## 2018-06-14 DIAGNOSIS — R1311 Dysphagia, oral phase: Secondary | ICD-10-CM | POA: Diagnosis not present

## 2018-06-14 DIAGNOSIS — R2689 Other abnormalities of gait and mobility: Secondary | ICD-10-CM | POA: Diagnosis not present

## 2018-06-14 DIAGNOSIS — Z741 Need for assistance with personal care: Secondary | ICD-10-CM | POA: Diagnosis not present

## 2018-06-14 DIAGNOSIS — M6281 Muscle weakness (generalized): Secondary | ICD-10-CM | POA: Diagnosis not present

## 2018-06-14 DIAGNOSIS — I209 Angina pectoris, unspecified: Secondary | ICD-10-CM | POA: Diagnosis not present

## 2018-06-14 DIAGNOSIS — J449 Chronic obstructive pulmonary disease, unspecified: Secondary | ICD-10-CM | POA: Diagnosis not present

## 2018-06-15 DIAGNOSIS — I209 Angina pectoris, unspecified: Secondary | ICD-10-CM | POA: Diagnosis not present

## 2018-06-15 DIAGNOSIS — R2689 Other abnormalities of gait and mobility: Secondary | ICD-10-CM | POA: Diagnosis not present

## 2018-06-15 DIAGNOSIS — J449 Chronic obstructive pulmonary disease, unspecified: Secondary | ICD-10-CM | POA: Diagnosis not present

## 2018-06-15 DIAGNOSIS — M6281 Muscle weakness (generalized): Secondary | ICD-10-CM | POA: Diagnosis not present

## 2018-06-15 DIAGNOSIS — R1311 Dysphagia, oral phase: Secondary | ICD-10-CM | POA: Diagnosis not present

## 2018-06-15 DIAGNOSIS — Z741 Need for assistance with personal care: Secondary | ICD-10-CM | POA: Diagnosis not present

## 2018-06-15 DIAGNOSIS — E782 Mixed hyperlipidemia: Secondary | ICD-10-CM | POA: Diagnosis not present

## 2018-06-15 DIAGNOSIS — Z79899 Other long term (current) drug therapy: Secondary | ICD-10-CM | POA: Diagnosis not present

## 2018-06-16 DIAGNOSIS — M6281 Muscle weakness (generalized): Secondary | ICD-10-CM | POA: Diagnosis not present

## 2018-06-16 DIAGNOSIS — J449 Chronic obstructive pulmonary disease, unspecified: Secondary | ICD-10-CM | POA: Diagnosis not present

## 2018-06-16 DIAGNOSIS — R1311 Dysphagia, oral phase: Secondary | ICD-10-CM | POA: Diagnosis not present

## 2018-06-16 DIAGNOSIS — R2689 Other abnormalities of gait and mobility: Secondary | ICD-10-CM | POA: Diagnosis not present

## 2018-06-16 DIAGNOSIS — Z741 Need for assistance with personal care: Secondary | ICD-10-CM | POA: Diagnosis not present

## 2018-06-16 DIAGNOSIS — I209 Angina pectoris, unspecified: Secondary | ICD-10-CM | POA: Diagnosis not present

## 2018-06-17 DIAGNOSIS — J449 Chronic obstructive pulmonary disease, unspecified: Secondary | ICD-10-CM | POA: Diagnosis not present

## 2018-06-17 DIAGNOSIS — R2689 Other abnormalities of gait and mobility: Secondary | ICD-10-CM | POA: Diagnosis not present

## 2018-06-17 DIAGNOSIS — Z741 Need for assistance with personal care: Secondary | ICD-10-CM | POA: Diagnosis not present

## 2018-06-17 DIAGNOSIS — I209 Angina pectoris, unspecified: Secondary | ICD-10-CM | POA: Diagnosis not present

## 2018-06-17 DIAGNOSIS — R1311 Dysphagia, oral phase: Secondary | ICD-10-CM | POA: Diagnosis not present

## 2018-06-17 DIAGNOSIS — M6281 Muscle weakness (generalized): Secondary | ICD-10-CM | POA: Diagnosis not present

## 2018-06-18 DIAGNOSIS — J449 Chronic obstructive pulmonary disease, unspecified: Secondary | ICD-10-CM | POA: Diagnosis not present

## 2018-06-18 DIAGNOSIS — R2689 Other abnormalities of gait and mobility: Secondary | ICD-10-CM | POA: Diagnosis not present

## 2018-06-18 DIAGNOSIS — R1311 Dysphagia, oral phase: Secondary | ICD-10-CM | POA: Diagnosis not present

## 2018-06-18 DIAGNOSIS — M6281 Muscle weakness (generalized): Secondary | ICD-10-CM | POA: Diagnosis not present

## 2018-06-18 DIAGNOSIS — I209 Angina pectoris, unspecified: Secondary | ICD-10-CM | POA: Diagnosis not present

## 2018-06-18 DIAGNOSIS — Z741 Need for assistance with personal care: Secondary | ICD-10-CM | POA: Diagnosis not present

## 2018-06-21 DIAGNOSIS — I209 Angina pectoris, unspecified: Secondary | ICD-10-CM | POA: Diagnosis not present

## 2018-06-21 DIAGNOSIS — M6281 Muscle weakness (generalized): Secondary | ICD-10-CM | POA: Diagnosis not present

## 2018-06-21 DIAGNOSIS — J449 Chronic obstructive pulmonary disease, unspecified: Secondary | ICD-10-CM | POA: Diagnosis not present

## 2018-06-21 DIAGNOSIS — R2689 Other abnormalities of gait and mobility: Secondary | ICD-10-CM | POA: Diagnosis not present

## 2018-06-21 DIAGNOSIS — R1311 Dysphagia, oral phase: Secondary | ICD-10-CM | POA: Diagnosis not present

## 2018-06-21 DIAGNOSIS — Z741 Need for assistance with personal care: Secondary | ICD-10-CM | POA: Diagnosis not present

## 2018-06-22 DIAGNOSIS — R2689 Other abnormalities of gait and mobility: Secondary | ICD-10-CM | POA: Diagnosis not present

## 2018-06-22 DIAGNOSIS — Z741 Need for assistance with personal care: Secondary | ICD-10-CM | POA: Diagnosis not present

## 2018-06-22 DIAGNOSIS — I209 Angina pectoris, unspecified: Secondary | ICD-10-CM | POA: Diagnosis not present

## 2018-06-22 DIAGNOSIS — R1311 Dysphagia, oral phase: Secondary | ICD-10-CM | POA: Diagnosis not present

## 2018-06-22 DIAGNOSIS — M6281 Muscle weakness (generalized): Secondary | ICD-10-CM | POA: Diagnosis not present

## 2018-06-22 DIAGNOSIS — J449 Chronic obstructive pulmonary disease, unspecified: Secondary | ICD-10-CM | POA: Diagnosis not present

## 2018-06-23 DIAGNOSIS — Z741 Need for assistance with personal care: Secondary | ICD-10-CM | POA: Diagnosis not present

## 2018-06-23 DIAGNOSIS — J449 Chronic obstructive pulmonary disease, unspecified: Secondary | ICD-10-CM | POA: Diagnosis not present

## 2018-06-23 DIAGNOSIS — R2689 Other abnormalities of gait and mobility: Secondary | ICD-10-CM | POA: Diagnosis not present

## 2018-06-23 DIAGNOSIS — R1311 Dysphagia, oral phase: Secondary | ICD-10-CM | POA: Diagnosis not present

## 2018-06-23 DIAGNOSIS — I209 Angina pectoris, unspecified: Secondary | ICD-10-CM | POA: Diagnosis not present

## 2018-06-23 DIAGNOSIS — M6281 Muscle weakness (generalized): Secondary | ICD-10-CM | POA: Diagnosis not present

## 2018-06-24 DIAGNOSIS — M6281 Muscle weakness (generalized): Secondary | ICD-10-CM | POA: Diagnosis not present

## 2018-06-24 DIAGNOSIS — R1311 Dysphagia, oral phase: Secondary | ICD-10-CM | POA: Diagnosis not present

## 2018-06-24 DIAGNOSIS — R2689 Other abnormalities of gait and mobility: Secondary | ICD-10-CM | POA: Diagnosis not present

## 2018-06-24 DIAGNOSIS — I209 Angina pectoris, unspecified: Secondary | ICD-10-CM | POA: Diagnosis not present

## 2018-06-24 DIAGNOSIS — J449 Chronic obstructive pulmonary disease, unspecified: Secondary | ICD-10-CM | POA: Diagnosis not present

## 2018-06-24 DIAGNOSIS — Z741 Need for assistance with personal care: Secondary | ICD-10-CM | POA: Diagnosis not present

## 2018-06-30 DIAGNOSIS — R1319 Other dysphagia: Secondary | ICD-10-CM | POA: Diagnosis not present

## 2018-06-30 DIAGNOSIS — K219 Gastro-esophageal reflux disease without esophagitis: Secondary | ICD-10-CM | POA: Diagnosis not present

## 2018-06-30 DIAGNOSIS — R3915 Urgency of urination: Secondary | ICD-10-CM | POA: Diagnosis not present

## 2018-07-01 DIAGNOSIS — D649 Anemia, unspecified: Secondary | ICD-10-CM | POA: Diagnosis not present

## 2018-07-01 DIAGNOSIS — N3281 Overactive bladder: Secondary | ICD-10-CM | POA: Diagnosis not present

## 2018-07-01 DIAGNOSIS — Z741 Need for assistance with personal care: Secondary | ICD-10-CM | POA: Diagnosis not present

## 2018-07-01 DIAGNOSIS — R319 Hematuria, unspecified: Secondary | ICD-10-CM | POA: Diagnosis not present

## 2018-07-01 DIAGNOSIS — J449 Chronic obstructive pulmonary disease, unspecified: Secondary | ICD-10-CM | POA: Diagnosis not present

## 2018-07-01 DIAGNOSIS — R1311 Dysphagia, oral phase: Secondary | ICD-10-CM | POA: Diagnosis not present

## 2018-07-01 DIAGNOSIS — M6281 Muscle weakness (generalized): Secondary | ICD-10-CM | POA: Diagnosis not present

## 2018-07-01 DIAGNOSIS — N390 Urinary tract infection, site not specified: Secondary | ICD-10-CM | POA: Diagnosis not present

## 2018-07-01 DIAGNOSIS — I209 Angina pectoris, unspecified: Secondary | ICD-10-CM | POA: Diagnosis not present

## 2018-07-01 DIAGNOSIS — R2689 Other abnormalities of gait and mobility: Secondary | ICD-10-CM | POA: Diagnosis not present

## 2018-07-01 DIAGNOSIS — K210 Gastro-esophageal reflux disease with esophagitis: Secondary | ICD-10-CM | POA: Diagnosis not present

## 2018-07-02 DIAGNOSIS — J449 Chronic obstructive pulmonary disease, unspecified: Secondary | ICD-10-CM | POA: Diagnosis not present

## 2018-07-02 DIAGNOSIS — I209 Angina pectoris, unspecified: Secondary | ICD-10-CM | POA: Diagnosis not present

## 2018-07-02 DIAGNOSIS — Z741 Need for assistance with personal care: Secondary | ICD-10-CM | POA: Diagnosis not present

## 2018-07-02 DIAGNOSIS — R1311 Dysphagia, oral phase: Secondary | ICD-10-CM | POA: Diagnosis not present

## 2018-07-02 DIAGNOSIS — R2689 Other abnormalities of gait and mobility: Secondary | ICD-10-CM | POA: Diagnosis not present

## 2018-07-02 DIAGNOSIS — R05 Cough: Secondary | ICD-10-CM | POA: Diagnosis not present

## 2018-07-02 DIAGNOSIS — M6281 Muscle weakness (generalized): Secondary | ICD-10-CM | POA: Diagnosis not present

## 2018-07-05 DIAGNOSIS — R41841 Cognitive communication deficit: Secondary | ICD-10-CM | POA: Diagnosis not present

## 2018-07-05 DIAGNOSIS — R1319 Other dysphagia: Secondary | ICD-10-CM | POA: Diagnosis not present

## 2018-07-05 DIAGNOSIS — J449 Chronic obstructive pulmonary disease, unspecified: Secondary | ICD-10-CM | POA: Diagnosis not present

## 2018-07-05 DIAGNOSIS — M810 Age-related osteoporosis without current pathological fracture: Secondary | ICD-10-CM | POA: Diagnosis not present

## 2018-07-05 DIAGNOSIS — M6281 Muscle weakness (generalized): Secondary | ICD-10-CM | POA: Diagnosis not present

## 2018-07-05 DIAGNOSIS — R2689 Other abnormalities of gait and mobility: Secondary | ICD-10-CM | POA: Diagnosis not present

## 2018-07-05 DIAGNOSIS — K219 Gastro-esophageal reflux disease without esophagitis: Secondary | ICD-10-CM | POA: Diagnosis not present

## 2018-07-05 DIAGNOSIS — Z741 Need for assistance with personal care: Secondary | ICD-10-CM | POA: Diagnosis not present

## 2018-07-05 DIAGNOSIS — I209 Angina pectoris, unspecified: Secondary | ICD-10-CM | POA: Diagnosis not present

## 2018-07-06 DIAGNOSIS — N390 Urinary tract infection, site not specified: Secondary | ICD-10-CM | POA: Diagnosis not present

## 2018-07-06 DIAGNOSIS — R319 Hematuria, unspecified: Secondary | ICD-10-CM | POA: Diagnosis not present

## 2018-07-06 DIAGNOSIS — J449 Chronic obstructive pulmonary disease, unspecified: Secondary | ICD-10-CM | POA: Diagnosis not present

## 2018-07-06 DIAGNOSIS — M6281 Muscle weakness (generalized): Secondary | ICD-10-CM | POA: Diagnosis not present

## 2018-07-06 DIAGNOSIS — Z79899 Other long term (current) drug therapy: Secondary | ICD-10-CM | POA: Diagnosis not present

## 2018-07-06 DIAGNOSIS — Z741 Need for assistance with personal care: Secondary | ICD-10-CM | POA: Diagnosis not present

## 2018-07-06 DIAGNOSIS — R1319 Other dysphagia: Secondary | ICD-10-CM | POA: Diagnosis not present

## 2018-07-06 DIAGNOSIS — I209 Angina pectoris, unspecified: Secondary | ICD-10-CM | POA: Diagnosis not present

## 2018-07-06 DIAGNOSIS — R2689 Other abnormalities of gait and mobility: Secondary | ICD-10-CM | POA: Diagnosis not present

## 2018-07-07 ENCOUNTER — Telehealth: Payer: Self-pay | Admitting: Gastroenterology

## 2018-07-07 DIAGNOSIS — Z741 Need for assistance with personal care: Secondary | ICD-10-CM | POA: Diagnosis not present

## 2018-07-07 DIAGNOSIS — I209 Angina pectoris, unspecified: Secondary | ICD-10-CM | POA: Diagnosis not present

## 2018-07-07 DIAGNOSIS — M6281 Muscle weakness (generalized): Secondary | ICD-10-CM | POA: Diagnosis not present

## 2018-07-07 DIAGNOSIS — R1319 Other dysphagia: Secondary | ICD-10-CM | POA: Diagnosis not present

## 2018-07-07 DIAGNOSIS — J449 Chronic obstructive pulmonary disease, unspecified: Secondary | ICD-10-CM | POA: Diagnosis not present

## 2018-07-07 DIAGNOSIS — R2689 Other abnormalities of gait and mobility: Secondary | ICD-10-CM | POA: Diagnosis not present

## 2018-07-08 DIAGNOSIS — R2689 Other abnormalities of gait and mobility: Secondary | ICD-10-CM | POA: Diagnosis not present

## 2018-07-08 DIAGNOSIS — R1319 Other dysphagia: Secondary | ICD-10-CM | POA: Diagnosis not present

## 2018-07-08 DIAGNOSIS — Z741 Need for assistance with personal care: Secondary | ICD-10-CM | POA: Diagnosis not present

## 2018-07-08 DIAGNOSIS — M6281 Muscle weakness (generalized): Secondary | ICD-10-CM | POA: Diagnosis not present

## 2018-07-08 DIAGNOSIS — J449 Chronic obstructive pulmonary disease, unspecified: Secondary | ICD-10-CM | POA: Diagnosis not present

## 2018-07-08 DIAGNOSIS — I209 Angina pectoris, unspecified: Secondary | ICD-10-CM | POA: Diagnosis not present

## 2018-07-09 DIAGNOSIS — R5381 Other malaise: Secondary | ICD-10-CM | POA: Diagnosis not present

## 2018-07-09 DIAGNOSIS — R2689 Other abnormalities of gait and mobility: Secondary | ICD-10-CM | POA: Diagnosis not present

## 2018-07-09 DIAGNOSIS — J984 Other disorders of lung: Secondary | ICD-10-CM | POA: Diagnosis not present

## 2018-07-09 DIAGNOSIS — I6389 Other cerebral infarction: Secondary | ICD-10-CM | POA: Diagnosis not present

## 2018-07-09 DIAGNOSIS — M6281 Muscle weakness (generalized): Secondary | ICD-10-CM | POA: Diagnosis not present

## 2018-07-09 DIAGNOSIS — I209 Angina pectoris, unspecified: Secondary | ICD-10-CM | POA: Diagnosis not present

## 2018-07-09 DIAGNOSIS — R1319 Other dysphagia: Secondary | ICD-10-CM | POA: Diagnosis not present

## 2018-07-09 DIAGNOSIS — J449 Chronic obstructive pulmonary disease, unspecified: Secondary | ICD-10-CM | POA: Diagnosis not present

## 2018-07-09 DIAGNOSIS — Z741 Need for assistance with personal care: Secondary | ICD-10-CM | POA: Diagnosis not present

## 2018-07-12 DIAGNOSIS — I209 Angina pectoris, unspecified: Secondary | ICD-10-CM | POA: Diagnosis not present

## 2018-07-12 DIAGNOSIS — Z741 Need for assistance with personal care: Secondary | ICD-10-CM | POA: Diagnosis not present

## 2018-07-12 DIAGNOSIS — M6281 Muscle weakness (generalized): Secondary | ICD-10-CM | POA: Diagnosis not present

## 2018-07-12 DIAGNOSIS — R2689 Other abnormalities of gait and mobility: Secondary | ICD-10-CM | POA: Diagnosis not present

## 2018-07-12 DIAGNOSIS — I6389 Other cerebral infarction: Secondary | ICD-10-CM | POA: Diagnosis not present

## 2018-07-12 DIAGNOSIS — R600 Localized edema: Secondary | ICD-10-CM | POA: Diagnosis not present

## 2018-07-12 DIAGNOSIS — R1319 Other dysphagia: Secondary | ICD-10-CM | POA: Diagnosis not present

## 2018-07-12 DIAGNOSIS — J449 Chronic obstructive pulmonary disease, unspecified: Secondary | ICD-10-CM | POA: Diagnosis not present

## 2018-07-13 DIAGNOSIS — R1319 Other dysphagia: Secondary | ICD-10-CM | POA: Diagnosis not present

## 2018-07-13 DIAGNOSIS — Z741 Need for assistance with personal care: Secondary | ICD-10-CM | POA: Diagnosis not present

## 2018-07-13 DIAGNOSIS — J449 Chronic obstructive pulmonary disease, unspecified: Secondary | ICD-10-CM | POA: Diagnosis not present

## 2018-07-13 DIAGNOSIS — R2689 Other abnormalities of gait and mobility: Secondary | ICD-10-CM | POA: Diagnosis not present

## 2018-07-13 DIAGNOSIS — M6281 Muscle weakness (generalized): Secondary | ICD-10-CM | POA: Diagnosis not present

## 2018-07-13 DIAGNOSIS — I209 Angina pectoris, unspecified: Secondary | ICD-10-CM | POA: Diagnosis not present

## 2018-07-14 DIAGNOSIS — M6281 Muscle weakness (generalized): Secondary | ICD-10-CM | POA: Diagnosis not present

## 2018-07-14 DIAGNOSIS — J449 Chronic obstructive pulmonary disease, unspecified: Secondary | ICD-10-CM | POA: Diagnosis not present

## 2018-07-14 DIAGNOSIS — R2689 Other abnormalities of gait and mobility: Secondary | ICD-10-CM | POA: Diagnosis not present

## 2018-07-14 DIAGNOSIS — R1319 Other dysphagia: Secondary | ICD-10-CM | POA: Diagnosis not present

## 2018-07-14 DIAGNOSIS — I209 Angina pectoris, unspecified: Secondary | ICD-10-CM | POA: Diagnosis not present

## 2018-07-14 DIAGNOSIS — Z741 Need for assistance with personal care: Secondary | ICD-10-CM | POA: Diagnosis not present

## 2018-07-15 DIAGNOSIS — M6281 Muscle weakness (generalized): Secondary | ICD-10-CM | POA: Diagnosis not present

## 2018-07-15 DIAGNOSIS — R2689 Other abnormalities of gait and mobility: Secondary | ICD-10-CM | POA: Diagnosis not present

## 2018-07-15 DIAGNOSIS — R1310 Dysphagia, unspecified: Secondary | ICD-10-CM | POA: Diagnosis not present

## 2018-07-15 DIAGNOSIS — J449 Chronic obstructive pulmonary disease, unspecified: Secondary | ICD-10-CM | POA: Diagnosis not present

## 2018-07-15 DIAGNOSIS — Z741 Need for assistance with personal care: Secondary | ICD-10-CM | POA: Diagnosis not present

## 2018-07-15 DIAGNOSIS — R1319 Other dysphagia: Secondary | ICD-10-CM | POA: Diagnosis not present

## 2018-07-15 DIAGNOSIS — I209 Angina pectoris, unspecified: Secondary | ICD-10-CM | POA: Diagnosis not present

## 2018-07-15 DIAGNOSIS — K224 Dyskinesia of esophagus: Secondary | ICD-10-CM | POA: Diagnosis not present

## 2018-07-16 DIAGNOSIS — R1319 Other dysphagia: Secondary | ICD-10-CM | POA: Diagnosis not present

## 2018-07-16 DIAGNOSIS — J449 Chronic obstructive pulmonary disease, unspecified: Secondary | ICD-10-CM | POA: Diagnosis not present

## 2018-07-16 DIAGNOSIS — M6281 Muscle weakness (generalized): Secondary | ICD-10-CM | POA: Diagnosis not present

## 2018-07-16 DIAGNOSIS — Z741 Need for assistance with personal care: Secondary | ICD-10-CM | POA: Diagnosis not present

## 2018-07-16 DIAGNOSIS — R2689 Other abnormalities of gait and mobility: Secondary | ICD-10-CM | POA: Diagnosis not present

## 2018-07-16 DIAGNOSIS — I209 Angina pectoris, unspecified: Secondary | ICD-10-CM | POA: Diagnosis not present

## 2018-07-19 DIAGNOSIS — R2689 Other abnormalities of gait and mobility: Secondary | ICD-10-CM | POA: Diagnosis not present

## 2018-07-19 DIAGNOSIS — Z741 Need for assistance with personal care: Secondary | ICD-10-CM | POA: Diagnosis not present

## 2018-07-19 DIAGNOSIS — J449 Chronic obstructive pulmonary disease, unspecified: Secondary | ICD-10-CM | POA: Diagnosis not present

## 2018-07-19 DIAGNOSIS — R1319 Other dysphagia: Secondary | ICD-10-CM | POA: Diagnosis not present

## 2018-07-19 DIAGNOSIS — I209 Angina pectoris, unspecified: Secondary | ICD-10-CM | POA: Diagnosis not present

## 2018-07-19 DIAGNOSIS — M6281 Muscle weakness (generalized): Secondary | ICD-10-CM | POA: Diagnosis not present

## 2018-07-20 DIAGNOSIS — I209 Angina pectoris, unspecified: Secondary | ICD-10-CM | POA: Diagnosis not present

## 2018-07-20 DIAGNOSIS — M6281 Muscle weakness (generalized): Secondary | ICD-10-CM | POA: Diagnosis not present

## 2018-07-20 DIAGNOSIS — R2689 Other abnormalities of gait and mobility: Secondary | ICD-10-CM | POA: Diagnosis not present

## 2018-07-20 DIAGNOSIS — R1319 Other dysphagia: Secondary | ICD-10-CM | POA: Diagnosis not present

## 2018-07-20 DIAGNOSIS — J449 Chronic obstructive pulmonary disease, unspecified: Secondary | ICD-10-CM | POA: Diagnosis not present

## 2018-07-20 DIAGNOSIS — Z741 Need for assistance with personal care: Secondary | ICD-10-CM | POA: Diagnosis not present

## 2018-07-21 DIAGNOSIS — R1319 Other dysphagia: Secondary | ICD-10-CM | POA: Diagnosis not present

## 2018-07-21 DIAGNOSIS — Z741 Need for assistance with personal care: Secondary | ICD-10-CM | POA: Diagnosis not present

## 2018-07-21 DIAGNOSIS — R2689 Other abnormalities of gait and mobility: Secondary | ICD-10-CM | POA: Diagnosis not present

## 2018-07-21 DIAGNOSIS — I209 Angina pectoris, unspecified: Secondary | ICD-10-CM | POA: Diagnosis not present

## 2018-07-21 DIAGNOSIS — J449 Chronic obstructive pulmonary disease, unspecified: Secondary | ICD-10-CM | POA: Diagnosis not present

## 2018-07-21 DIAGNOSIS — M6281 Muscle weakness (generalized): Secondary | ICD-10-CM | POA: Diagnosis not present

## 2018-07-29 DIAGNOSIS — Z79899 Other long term (current) drug therapy: Secondary | ICD-10-CM | POA: Diagnosis not present

## 2018-07-29 DIAGNOSIS — E785 Hyperlipidemia, unspecified: Secondary | ICD-10-CM | POA: Diagnosis not present

## 2018-08-09 DIAGNOSIS — F418 Other specified anxiety disorders: Secondary | ICD-10-CM | POA: Diagnosis not present

## 2018-08-09 DIAGNOSIS — R1319 Other dysphagia: Secondary | ICD-10-CM | POA: Diagnosis not present

## 2018-08-09 DIAGNOSIS — K219 Gastro-esophageal reflux disease without esophagitis: Secondary | ICD-10-CM | POA: Diagnosis not present

## 2018-08-09 DIAGNOSIS — E782 Mixed hyperlipidemia: Secondary | ICD-10-CM | POA: Diagnosis not present

## 2018-08-18 DIAGNOSIS — M79671 Pain in right foot: Secondary | ICD-10-CM | POA: Diagnosis not present

## 2018-08-18 DIAGNOSIS — M79674 Pain in right toe(s): Secondary | ICD-10-CM | POA: Diagnosis not present

## 2018-08-18 DIAGNOSIS — B351 Tinea unguium: Secondary | ICD-10-CM | POA: Diagnosis not present

## 2018-08-25 DIAGNOSIS — I209 Angina pectoris, unspecified: Secondary | ICD-10-CM | POA: Diagnosis not present

## 2018-08-25 DIAGNOSIS — R41841 Cognitive communication deficit: Secondary | ICD-10-CM | POA: Diagnosis not present

## 2018-08-25 DIAGNOSIS — R2689 Other abnormalities of gait and mobility: Secondary | ICD-10-CM | POA: Diagnosis not present

## 2018-08-25 DIAGNOSIS — K210 Gastro-esophageal reflux disease with esophagitis: Secondary | ICD-10-CM | POA: Diagnosis not present

## 2018-08-25 DIAGNOSIS — R1319 Other dysphagia: Secondary | ICD-10-CM | POA: Diagnosis not present

## 2018-08-25 DIAGNOSIS — M810 Age-related osteoporosis without current pathological fracture: Secondary | ICD-10-CM | POA: Diagnosis not present

## 2018-08-25 DIAGNOSIS — Z741 Need for assistance with personal care: Secondary | ICD-10-CM | POA: Diagnosis not present

## 2018-08-25 DIAGNOSIS — J449 Chronic obstructive pulmonary disease, unspecified: Secondary | ICD-10-CM | POA: Diagnosis not present

## 2018-08-25 DIAGNOSIS — M6281 Muscle weakness (generalized): Secondary | ICD-10-CM | POA: Diagnosis not present

## 2018-08-26 DIAGNOSIS — B3781 Candidal esophagitis: Secondary | ICD-10-CM | POA: Diagnosis not present

## 2018-08-26 DIAGNOSIS — K209 Esophagitis, unspecified: Secondary | ICD-10-CM | POA: Diagnosis not present

## 2018-08-26 DIAGNOSIS — R1310 Dysphagia, unspecified: Secondary | ICD-10-CM | POA: Diagnosis not present

## 2018-08-26 DIAGNOSIS — Z01810 Encounter for preprocedural cardiovascular examination: Secondary | ICD-10-CM | POA: Diagnosis not present

## 2018-08-26 DIAGNOSIS — I739 Peripheral vascular disease, unspecified: Secondary | ICD-10-CM | POA: Diagnosis not present

## 2018-08-26 DIAGNOSIS — K228 Other specified diseases of esophagus: Secondary | ICD-10-CM | POA: Diagnosis not present

## 2018-08-26 DIAGNOSIS — J449 Chronic obstructive pulmonary disease, unspecified: Secondary | ICD-10-CM | POA: Diagnosis not present

## 2018-08-27 DIAGNOSIS — Z741 Need for assistance with personal care: Secondary | ICD-10-CM | POA: Diagnosis not present

## 2018-08-27 DIAGNOSIS — R2689 Other abnormalities of gait and mobility: Secondary | ICD-10-CM | POA: Diagnosis not present

## 2018-08-27 DIAGNOSIS — I209 Angina pectoris, unspecified: Secondary | ICD-10-CM | POA: Diagnosis not present

## 2018-08-27 DIAGNOSIS — J449 Chronic obstructive pulmonary disease, unspecified: Secondary | ICD-10-CM | POA: Diagnosis not present

## 2018-08-27 DIAGNOSIS — R1319 Other dysphagia: Secondary | ICD-10-CM | POA: Diagnosis not present

## 2018-08-27 DIAGNOSIS — M6281 Muscle weakness (generalized): Secondary | ICD-10-CM | POA: Diagnosis not present

## 2018-08-30 DIAGNOSIS — M6281 Muscle weakness (generalized): Secondary | ICD-10-CM | POA: Diagnosis not present

## 2018-08-30 DIAGNOSIS — J449 Chronic obstructive pulmonary disease, unspecified: Secondary | ICD-10-CM | POA: Diagnosis not present

## 2018-08-30 DIAGNOSIS — R2689 Other abnormalities of gait and mobility: Secondary | ICD-10-CM | POA: Diagnosis not present

## 2018-08-30 DIAGNOSIS — I209 Angina pectoris, unspecified: Secondary | ICD-10-CM | POA: Diagnosis not present

## 2018-08-30 DIAGNOSIS — R1319 Other dysphagia: Secondary | ICD-10-CM | POA: Diagnosis not present

## 2018-08-30 DIAGNOSIS — Z741 Need for assistance with personal care: Secondary | ICD-10-CM | POA: Diagnosis not present

## 2018-08-31 DIAGNOSIS — Z741 Need for assistance with personal care: Secondary | ICD-10-CM | POA: Diagnosis not present

## 2018-08-31 DIAGNOSIS — I209 Angina pectoris, unspecified: Secondary | ICD-10-CM | POA: Diagnosis not present

## 2018-08-31 DIAGNOSIS — R2689 Other abnormalities of gait and mobility: Secondary | ICD-10-CM | POA: Diagnosis not present

## 2018-08-31 DIAGNOSIS — M6281 Muscle weakness (generalized): Secondary | ICD-10-CM | POA: Diagnosis not present

## 2018-08-31 DIAGNOSIS — J449 Chronic obstructive pulmonary disease, unspecified: Secondary | ICD-10-CM | POA: Diagnosis not present

## 2018-08-31 DIAGNOSIS — R1319 Other dysphagia: Secondary | ICD-10-CM | POA: Diagnosis not present

## 2018-09-01 DIAGNOSIS — M6281 Muscle weakness (generalized): Secondary | ICD-10-CM | POA: Diagnosis not present

## 2018-09-01 DIAGNOSIS — J449 Chronic obstructive pulmonary disease, unspecified: Secondary | ICD-10-CM | POA: Diagnosis not present

## 2018-09-01 DIAGNOSIS — Z741 Need for assistance with personal care: Secondary | ICD-10-CM | POA: Diagnosis not present

## 2018-09-01 DIAGNOSIS — I209 Angina pectoris, unspecified: Secondary | ICD-10-CM | POA: Diagnosis not present

## 2018-09-01 DIAGNOSIS — R1319 Other dysphagia: Secondary | ICD-10-CM | POA: Diagnosis not present

## 2018-09-01 DIAGNOSIS — R2689 Other abnormalities of gait and mobility: Secondary | ICD-10-CM | POA: Diagnosis not present

## 2018-09-02 DIAGNOSIS — J449 Chronic obstructive pulmonary disease, unspecified: Secondary | ICD-10-CM | POA: Diagnosis not present

## 2018-09-02 DIAGNOSIS — Z741 Need for assistance with personal care: Secondary | ICD-10-CM | POA: Diagnosis not present

## 2018-09-02 DIAGNOSIS — R2689 Other abnormalities of gait and mobility: Secondary | ICD-10-CM | POA: Diagnosis not present

## 2018-09-02 DIAGNOSIS — M6281 Muscle weakness (generalized): Secondary | ICD-10-CM | POA: Diagnosis not present

## 2018-09-02 DIAGNOSIS — R1319 Other dysphagia: Secondary | ICD-10-CM | POA: Diagnosis not present

## 2018-09-02 DIAGNOSIS — I209 Angina pectoris, unspecified: Secondary | ICD-10-CM | POA: Diagnosis not present

## 2018-09-03 DIAGNOSIS — K210 Gastro-esophageal reflux disease with esophagitis: Secondary | ICD-10-CM | POA: Diagnosis not present

## 2018-09-03 DIAGNOSIS — R41841 Cognitive communication deficit: Secondary | ICD-10-CM | POA: Diagnosis not present

## 2018-09-03 DIAGNOSIS — M6281 Muscle weakness (generalized): Secondary | ICD-10-CM | POA: Diagnosis not present

## 2018-09-03 DIAGNOSIS — J449 Chronic obstructive pulmonary disease, unspecified: Secondary | ICD-10-CM | POA: Diagnosis not present

## 2018-09-03 DIAGNOSIS — I209 Angina pectoris, unspecified: Secondary | ICD-10-CM | POA: Diagnosis not present

## 2018-09-03 DIAGNOSIS — R1319 Other dysphagia: Secondary | ICD-10-CM | POA: Diagnosis not present

## 2018-09-03 DIAGNOSIS — M810 Age-related osteoporosis without current pathological fracture: Secondary | ICD-10-CM | POA: Diagnosis not present

## 2018-09-03 DIAGNOSIS — R2689 Other abnormalities of gait and mobility: Secondary | ICD-10-CM | POA: Diagnosis not present

## 2018-09-03 DIAGNOSIS — R278 Other lack of coordination: Secondary | ICD-10-CM | POA: Diagnosis not present

## 2018-09-03 DIAGNOSIS — I639 Cerebral infarction, unspecified: Secondary | ICD-10-CM | POA: Diagnosis not present

## 2018-09-03 DIAGNOSIS — Z741 Need for assistance with personal care: Secondary | ICD-10-CM | POA: Diagnosis not present

## 2018-09-06 DIAGNOSIS — M6281 Muscle weakness (generalized): Secondary | ICD-10-CM | POA: Diagnosis not present

## 2018-09-06 DIAGNOSIS — Z741 Need for assistance with personal care: Secondary | ICD-10-CM | POA: Diagnosis not present

## 2018-09-06 DIAGNOSIS — R1319 Other dysphagia: Secondary | ICD-10-CM | POA: Diagnosis not present

## 2018-09-06 DIAGNOSIS — R2689 Other abnormalities of gait and mobility: Secondary | ICD-10-CM | POA: Diagnosis not present

## 2018-09-06 DIAGNOSIS — J449 Chronic obstructive pulmonary disease, unspecified: Secondary | ICD-10-CM | POA: Diagnosis not present

## 2018-09-06 DIAGNOSIS — I209 Angina pectoris, unspecified: Secondary | ICD-10-CM | POA: Diagnosis not present

## 2018-09-07 DIAGNOSIS — Z741 Need for assistance with personal care: Secondary | ICD-10-CM | POA: Diagnosis not present

## 2018-09-07 DIAGNOSIS — I209 Angina pectoris, unspecified: Secondary | ICD-10-CM | POA: Diagnosis not present

## 2018-09-07 DIAGNOSIS — M6281 Muscle weakness (generalized): Secondary | ICD-10-CM | POA: Diagnosis not present

## 2018-09-07 DIAGNOSIS — R1319 Other dysphagia: Secondary | ICD-10-CM | POA: Diagnosis not present

## 2018-09-07 DIAGNOSIS — J449 Chronic obstructive pulmonary disease, unspecified: Secondary | ICD-10-CM | POA: Diagnosis not present

## 2018-09-07 DIAGNOSIS — R2689 Other abnormalities of gait and mobility: Secondary | ICD-10-CM | POA: Diagnosis not present

## 2018-09-08 DIAGNOSIS — R1319 Other dysphagia: Secondary | ICD-10-CM | POA: Diagnosis not present

## 2018-09-08 DIAGNOSIS — J449 Chronic obstructive pulmonary disease, unspecified: Secondary | ICD-10-CM | POA: Diagnosis not present

## 2018-09-08 DIAGNOSIS — I209 Angina pectoris, unspecified: Secondary | ICD-10-CM | POA: Diagnosis not present

## 2018-09-08 DIAGNOSIS — M6281 Muscle weakness (generalized): Secondary | ICD-10-CM | POA: Diagnosis not present

## 2018-09-08 DIAGNOSIS — R2689 Other abnormalities of gait and mobility: Secondary | ICD-10-CM | POA: Diagnosis not present

## 2018-09-08 DIAGNOSIS — Z741 Need for assistance with personal care: Secondary | ICD-10-CM | POA: Diagnosis not present

## 2018-09-09 DIAGNOSIS — Z741 Need for assistance with personal care: Secondary | ICD-10-CM | POA: Diagnosis not present

## 2018-09-09 DIAGNOSIS — R1319 Other dysphagia: Secondary | ICD-10-CM | POA: Diagnosis not present

## 2018-09-09 DIAGNOSIS — M6281 Muscle weakness (generalized): Secondary | ICD-10-CM | POA: Diagnosis not present

## 2018-09-09 DIAGNOSIS — R2689 Other abnormalities of gait and mobility: Secondary | ICD-10-CM | POA: Diagnosis not present

## 2018-09-09 DIAGNOSIS — I209 Angina pectoris, unspecified: Secondary | ICD-10-CM | POA: Diagnosis not present

## 2018-09-09 DIAGNOSIS — J449 Chronic obstructive pulmonary disease, unspecified: Secondary | ICD-10-CM | POA: Diagnosis not present

## 2018-09-10 DIAGNOSIS — I209 Angina pectoris, unspecified: Secondary | ICD-10-CM | POA: Diagnosis not present

## 2018-09-10 DIAGNOSIS — J449 Chronic obstructive pulmonary disease, unspecified: Secondary | ICD-10-CM | POA: Diagnosis not present

## 2018-09-10 DIAGNOSIS — M6281 Muscle weakness (generalized): Secondary | ICD-10-CM | POA: Diagnosis not present

## 2018-09-10 DIAGNOSIS — R1319 Other dysphagia: Secondary | ICD-10-CM | POA: Diagnosis not present

## 2018-09-10 DIAGNOSIS — R2689 Other abnormalities of gait and mobility: Secondary | ICD-10-CM | POA: Diagnosis not present

## 2018-09-10 DIAGNOSIS — Z741 Need for assistance with personal care: Secondary | ICD-10-CM | POA: Diagnosis not present

## 2018-09-13 DIAGNOSIS — R2689 Other abnormalities of gait and mobility: Secondary | ICD-10-CM | POA: Diagnosis not present

## 2018-09-13 DIAGNOSIS — Z741 Need for assistance with personal care: Secondary | ICD-10-CM | POA: Diagnosis not present

## 2018-09-13 DIAGNOSIS — J449 Chronic obstructive pulmonary disease, unspecified: Secondary | ICD-10-CM | POA: Diagnosis not present

## 2018-09-13 DIAGNOSIS — R1319 Other dysphagia: Secondary | ICD-10-CM | POA: Diagnosis not present

## 2018-09-13 DIAGNOSIS — M6281 Muscle weakness (generalized): Secondary | ICD-10-CM | POA: Diagnosis not present

## 2018-09-13 DIAGNOSIS — I209 Angina pectoris, unspecified: Secondary | ICD-10-CM | POA: Diagnosis not present

## 2018-09-14 DIAGNOSIS — Z741 Need for assistance with personal care: Secondary | ICD-10-CM | POA: Diagnosis not present

## 2018-09-14 DIAGNOSIS — J449 Chronic obstructive pulmonary disease, unspecified: Secondary | ICD-10-CM | POA: Diagnosis not present

## 2018-09-14 DIAGNOSIS — R2689 Other abnormalities of gait and mobility: Secondary | ICD-10-CM | POA: Diagnosis not present

## 2018-09-14 DIAGNOSIS — I209 Angina pectoris, unspecified: Secondary | ICD-10-CM | POA: Diagnosis not present

## 2018-09-14 DIAGNOSIS — R1319 Other dysphagia: Secondary | ICD-10-CM | POA: Diagnosis not present

## 2018-09-14 DIAGNOSIS — M6281 Muscle weakness (generalized): Secondary | ICD-10-CM | POA: Diagnosis not present

## 2018-09-15 DIAGNOSIS — Z741 Need for assistance with personal care: Secondary | ICD-10-CM | POA: Diagnosis not present

## 2018-09-15 DIAGNOSIS — I209 Angina pectoris, unspecified: Secondary | ICD-10-CM | POA: Diagnosis not present

## 2018-09-15 DIAGNOSIS — M6281 Muscle weakness (generalized): Secondary | ICD-10-CM | POA: Diagnosis not present

## 2018-09-15 DIAGNOSIS — R1319 Other dysphagia: Secondary | ICD-10-CM | POA: Diagnosis not present

## 2018-09-15 DIAGNOSIS — R2689 Other abnormalities of gait and mobility: Secondary | ICD-10-CM | POA: Diagnosis not present

## 2018-09-15 DIAGNOSIS — J449 Chronic obstructive pulmonary disease, unspecified: Secondary | ICD-10-CM | POA: Diagnosis not present

## 2018-09-16 DIAGNOSIS — R1319 Other dysphagia: Secondary | ICD-10-CM | POA: Diagnosis not present

## 2018-09-16 DIAGNOSIS — J449 Chronic obstructive pulmonary disease, unspecified: Secondary | ICD-10-CM | POA: Diagnosis not present

## 2018-09-16 DIAGNOSIS — Z741 Need for assistance with personal care: Secondary | ICD-10-CM | POA: Diagnosis not present

## 2018-09-16 DIAGNOSIS — M6281 Muscle weakness (generalized): Secondary | ICD-10-CM | POA: Diagnosis not present

## 2018-09-16 DIAGNOSIS — I209 Angina pectoris, unspecified: Secondary | ICD-10-CM | POA: Diagnosis not present

## 2018-09-16 DIAGNOSIS — R2689 Other abnormalities of gait and mobility: Secondary | ICD-10-CM | POA: Diagnosis not present

## 2018-09-17 DIAGNOSIS — Z741 Need for assistance with personal care: Secondary | ICD-10-CM | POA: Diagnosis not present

## 2018-09-17 DIAGNOSIS — I209 Angina pectoris, unspecified: Secondary | ICD-10-CM | POA: Diagnosis not present

## 2018-09-17 DIAGNOSIS — J449 Chronic obstructive pulmonary disease, unspecified: Secondary | ICD-10-CM | POA: Diagnosis not present

## 2018-09-17 DIAGNOSIS — R1319 Other dysphagia: Secondary | ICD-10-CM | POA: Diagnosis not present

## 2018-09-17 DIAGNOSIS — M6281 Muscle weakness (generalized): Secondary | ICD-10-CM | POA: Diagnosis not present

## 2018-09-17 DIAGNOSIS — R2689 Other abnormalities of gait and mobility: Secondary | ICD-10-CM | POA: Diagnosis not present

## 2018-09-20 DIAGNOSIS — M6281 Muscle weakness (generalized): Secondary | ICD-10-CM | POA: Diagnosis not present

## 2018-09-20 DIAGNOSIS — R1319 Other dysphagia: Secondary | ICD-10-CM | POA: Diagnosis not present

## 2018-09-20 DIAGNOSIS — R2689 Other abnormalities of gait and mobility: Secondary | ICD-10-CM | POA: Diagnosis not present

## 2018-09-20 DIAGNOSIS — Z741 Need for assistance with personal care: Secondary | ICD-10-CM | POA: Diagnosis not present

## 2018-09-20 DIAGNOSIS — J449 Chronic obstructive pulmonary disease, unspecified: Secondary | ICD-10-CM | POA: Diagnosis not present

## 2018-09-20 DIAGNOSIS — I209 Angina pectoris, unspecified: Secondary | ICD-10-CM | POA: Diagnosis not present

## 2018-09-21 DIAGNOSIS — R2689 Other abnormalities of gait and mobility: Secondary | ICD-10-CM | POA: Diagnosis not present

## 2018-09-21 DIAGNOSIS — Z741 Need for assistance with personal care: Secondary | ICD-10-CM | POA: Diagnosis not present

## 2018-09-21 DIAGNOSIS — R1319 Other dysphagia: Secondary | ICD-10-CM | POA: Diagnosis not present

## 2018-09-21 DIAGNOSIS — I209 Angina pectoris, unspecified: Secondary | ICD-10-CM | POA: Diagnosis not present

## 2018-09-21 DIAGNOSIS — M6281 Muscle weakness (generalized): Secondary | ICD-10-CM | POA: Diagnosis not present

## 2018-09-21 DIAGNOSIS — J449 Chronic obstructive pulmonary disease, unspecified: Secondary | ICD-10-CM | POA: Diagnosis not present

## 2018-09-22 DIAGNOSIS — I209 Angina pectoris, unspecified: Secondary | ICD-10-CM | POA: Diagnosis not present

## 2018-09-22 DIAGNOSIS — R1319 Other dysphagia: Secondary | ICD-10-CM | POA: Diagnosis not present

## 2018-09-22 DIAGNOSIS — J449 Chronic obstructive pulmonary disease, unspecified: Secondary | ICD-10-CM | POA: Diagnosis not present

## 2018-09-22 DIAGNOSIS — R2689 Other abnormalities of gait and mobility: Secondary | ICD-10-CM | POA: Diagnosis not present

## 2018-09-22 DIAGNOSIS — Z741 Need for assistance with personal care: Secondary | ICD-10-CM | POA: Diagnosis not present

## 2018-09-22 DIAGNOSIS — M6281 Muscle weakness (generalized): Secondary | ICD-10-CM | POA: Diagnosis not present

## 2018-09-23 DIAGNOSIS — R1319 Other dysphagia: Secondary | ICD-10-CM | POA: Diagnosis not present

## 2018-09-23 DIAGNOSIS — I209 Angina pectoris, unspecified: Secondary | ICD-10-CM | POA: Diagnosis not present

## 2018-09-23 DIAGNOSIS — R2689 Other abnormalities of gait and mobility: Secondary | ICD-10-CM | POA: Diagnosis not present

## 2018-09-23 DIAGNOSIS — M6281 Muscle weakness (generalized): Secondary | ICD-10-CM | POA: Diagnosis not present

## 2018-09-23 DIAGNOSIS — J449 Chronic obstructive pulmonary disease, unspecified: Secondary | ICD-10-CM | POA: Diagnosis not present

## 2018-09-23 DIAGNOSIS — Z741 Need for assistance with personal care: Secondary | ICD-10-CM | POA: Diagnosis not present

## 2018-09-24 DIAGNOSIS — J449 Chronic obstructive pulmonary disease, unspecified: Secondary | ICD-10-CM | POA: Diagnosis not present

## 2018-09-24 DIAGNOSIS — R2689 Other abnormalities of gait and mobility: Secondary | ICD-10-CM | POA: Diagnosis not present

## 2018-09-24 DIAGNOSIS — R1319 Other dysphagia: Secondary | ICD-10-CM | POA: Diagnosis not present

## 2018-09-24 DIAGNOSIS — M6281 Muscle weakness (generalized): Secondary | ICD-10-CM | POA: Diagnosis not present

## 2018-09-24 DIAGNOSIS — Z741 Need for assistance with personal care: Secondary | ICD-10-CM | POA: Diagnosis not present

## 2018-09-24 DIAGNOSIS — I209 Angina pectoris, unspecified: Secondary | ICD-10-CM | POA: Diagnosis not present

## 2018-09-26 DIAGNOSIS — Z741 Need for assistance with personal care: Secondary | ICD-10-CM | POA: Diagnosis not present

## 2018-09-26 DIAGNOSIS — R2689 Other abnormalities of gait and mobility: Secondary | ICD-10-CM | POA: Diagnosis not present

## 2018-09-26 DIAGNOSIS — I209 Angina pectoris, unspecified: Secondary | ICD-10-CM | POA: Diagnosis not present

## 2018-09-26 DIAGNOSIS — R1319 Other dysphagia: Secondary | ICD-10-CM | POA: Diagnosis not present

## 2018-09-26 DIAGNOSIS — J449 Chronic obstructive pulmonary disease, unspecified: Secondary | ICD-10-CM | POA: Diagnosis not present

## 2018-09-26 DIAGNOSIS — M6281 Muscle weakness (generalized): Secondary | ICD-10-CM | POA: Diagnosis not present

## 2018-09-27 DIAGNOSIS — R2689 Other abnormalities of gait and mobility: Secondary | ICD-10-CM | POA: Diagnosis not present

## 2018-09-27 DIAGNOSIS — M6281 Muscle weakness (generalized): Secondary | ICD-10-CM | POA: Diagnosis not present

## 2018-09-27 DIAGNOSIS — J449 Chronic obstructive pulmonary disease, unspecified: Secondary | ICD-10-CM | POA: Diagnosis not present

## 2018-09-27 DIAGNOSIS — Z741 Need for assistance with personal care: Secondary | ICD-10-CM | POA: Diagnosis not present

## 2018-09-27 DIAGNOSIS — R1319 Other dysphagia: Secondary | ICD-10-CM | POA: Diagnosis not present

## 2018-09-27 DIAGNOSIS — I209 Angina pectoris, unspecified: Secondary | ICD-10-CM | POA: Diagnosis not present

## 2018-09-28 DIAGNOSIS — M6281 Muscle weakness (generalized): Secondary | ICD-10-CM | POA: Diagnosis not present

## 2018-09-28 DIAGNOSIS — J449 Chronic obstructive pulmonary disease, unspecified: Secondary | ICD-10-CM | POA: Diagnosis not present

## 2018-09-28 DIAGNOSIS — I209 Angina pectoris, unspecified: Secondary | ICD-10-CM | POA: Diagnosis not present

## 2018-09-28 DIAGNOSIS — Z741 Need for assistance with personal care: Secondary | ICD-10-CM | POA: Diagnosis not present

## 2018-09-28 DIAGNOSIS — R2689 Other abnormalities of gait and mobility: Secondary | ICD-10-CM | POA: Diagnosis not present

## 2018-09-28 DIAGNOSIS — R1319 Other dysphagia: Secondary | ICD-10-CM | POA: Diagnosis not present

## 2018-09-29 DIAGNOSIS — I209 Angina pectoris, unspecified: Secondary | ICD-10-CM | POA: Diagnosis not present

## 2018-09-29 DIAGNOSIS — Z741 Need for assistance with personal care: Secondary | ICD-10-CM | POA: Diagnosis not present

## 2018-09-29 DIAGNOSIS — M6281 Muscle weakness (generalized): Secondary | ICD-10-CM | POA: Diagnosis not present

## 2018-09-29 DIAGNOSIS — R2689 Other abnormalities of gait and mobility: Secondary | ICD-10-CM | POA: Diagnosis not present

## 2018-09-29 DIAGNOSIS — R1319 Other dysphagia: Secondary | ICD-10-CM | POA: Diagnosis not present

## 2018-09-29 DIAGNOSIS — J449 Chronic obstructive pulmonary disease, unspecified: Secondary | ICD-10-CM | POA: Diagnosis not present

## 2018-10-01 DIAGNOSIS — Z741 Need for assistance with personal care: Secondary | ICD-10-CM | POA: Diagnosis not present

## 2018-10-01 DIAGNOSIS — R2689 Other abnormalities of gait and mobility: Secondary | ICD-10-CM | POA: Diagnosis not present

## 2018-10-01 DIAGNOSIS — J449 Chronic obstructive pulmonary disease, unspecified: Secondary | ICD-10-CM | POA: Diagnosis not present

## 2018-10-01 DIAGNOSIS — M6281 Muscle weakness (generalized): Secondary | ICD-10-CM | POA: Diagnosis not present

## 2018-10-01 DIAGNOSIS — R1319 Other dysphagia: Secondary | ICD-10-CM | POA: Diagnosis not present

## 2018-10-01 DIAGNOSIS — I209 Angina pectoris, unspecified: Secondary | ICD-10-CM | POA: Diagnosis not present

## 2018-10-04 DIAGNOSIS — J984 Other disorders of lung: Secondary | ICD-10-CM | POA: Diagnosis not present

## 2018-10-04 DIAGNOSIS — R1319 Other dysphagia: Secondary | ICD-10-CM | POA: Diagnosis not present

## 2018-10-04 DIAGNOSIS — R278 Other lack of coordination: Secondary | ICD-10-CM | POA: Diagnosis not present

## 2018-10-04 DIAGNOSIS — K219 Gastro-esophageal reflux disease without esophagitis: Secondary | ICD-10-CM | POA: Diagnosis not present

## 2018-10-04 DIAGNOSIS — R2689 Other abnormalities of gait and mobility: Secondary | ICD-10-CM | POA: Diagnosis not present

## 2018-10-04 DIAGNOSIS — M6281 Muscle weakness (generalized): Secondary | ICD-10-CM | POA: Diagnosis not present

## 2018-10-04 DIAGNOSIS — Z741 Need for assistance with personal care: Secondary | ICD-10-CM | POA: Diagnosis not present

## 2018-10-04 DIAGNOSIS — M810 Age-related osteoporosis without current pathological fracture: Secondary | ICD-10-CM | POA: Diagnosis not present

## 2018-10-04 DIAGNOSIS — J449 Chronic obstructive pulmonary disease, unspecified: Secondary | ICD-10-CM | POA: Diagnosis not present

## 2018-10-04 DIAGNOSIS — K210 Gastro-esophageal reflux disease with esophagitis: Secondary | ICD-10-CM | POA: Diagnosis not present

## 2018-10-04 DIAGNOSIS — I639 Cerebral infarction, unspecified: Secondary | ICD-10-CM | POA: Diagnosis not present

## 2018-10-04 DIAGNOSIS — R41841 Cognitive communication deficit: Secondary | ICD-10-CM | POA: Diagnosis not present

## 2018-10-04 DIAGNOSIS — I6389 Other cerebral infarction: Secondary | ICD-10-CM | POA: Diagnosis not present

## 2018-10-04 DIAGNOSIS — I209 Angina pectoris, unspecified: Secondary | ICD-10-CM | POA: Diagnosis not present

## 2018-10-05 DIAGNOSIS — I209 Angina pectoris, unspecified: Secondary | ICD-10-CM | POA: Diagnosis not present

## 2018-10-05 DIAGNOSIS — J449 Chronic obstructive pulmonary disease, unspecified: Secondary | ICD-10-CM | POA: Diagnosis not present

## 2018-10-05 DIAGNOSIS — Z741 Need for assistance with personal care: Secondary | ICD-10-CM | POA: Diagnosis not present

## 2018-10-05 DIAGNOSIS — R1319 Other dysphagia: Secondary | ICD-10-CM | POA: Diagnosis not present

## 2018-10-05 DIAGNOSIS — M6281 Muscle weakness (generalized): Secondary | ICD-10-CM | POA: Diagnosis not present

## 2018-10-05 DIAGNOSIS — R2689 Other abnormalities of gait and mobility: Secondary | ICD-10-CM | POA: Diagnosis not present

## 2018-10-06 DIAGNOSIS — Z741 Need for assistance with personal care: Secondary | ICD-10-CM | POA: Diagnosis not present

## 2018-10-06 DIAGNOSIS — R2689 Other abnormalities of gait and mobility: Secondary | ICD-10-CM | POA: Diagnosis not present

## 2018-10-06 DIAGNOSIS — R1319 Other dysphagia: Secondary | ICD-10-CM | POA: Diagnosis not present

## 2018-10-06 DIAGNOSIS — M6281 Muscle weakness (generalized): Secondary | ICD-10-CM | POA: Diagnosis not present

## 2018-10-06 DIAGNOSIS — I209 Angina pectoris, unspecified: Secondary | ICD-10-CM | POA: Diagnosis not present

## 2018-10-06 DIAGNOSIS — J449 Chronic obstructive pulmonary disease, unspecified: Secondary | ICD-10-CM | POA: Diagnosis not present

## 2018-10-07 DIAGNOSIS — I209 Angina pectoris, unspecified: Secondary | ICD-10-CM | POA: Diagnosis not present

## 2018-10-07 DIAGNOSIS — M6281 Muscle weakness (generalized): Secondary | ICD-10-CM | POA: Diagnosis not present

## 2018-10-07 DIAGNOSIS — R1319 Other dysphagia: Secondary | ICD-10-CM | POA: Diagnosis not present

## 2018-10-07 DIAGNOSIS — R2689 Other abnormalities of gait and mobility: Secondary | ICD-10-CM | POA: Diagnosis not present

## 2018-10-07 DIAGNOSIS — J449 Chronic obstructive pulmonary disease, unspecified: Secondary | ICD-10-CM | POA: Diagnosis not present

## 2018-10-07 DIAGNOSIS — Z741 Need for assistance with personal care: Secondary | ICD-10-CM | POA: Diagnosis not present

## 2018-11-22 DIAGNOSIS — D6489 Other specified anemias: Secondary | ICD-10-CM | POA: Diagnosis not present

## 2018-11-22 DIAGNOSIS — I9589 Other hypotension: Secondary | ICD-10-CM | POA: Diagnosis not present

## 2018-11-23 DIAGNOSIS — H35033 Hypertensive retinopathy, bilateral: Secondary | ICD-10-CM | POA: Diagnosis not present

## 2018-11-23 DIAGNOSIS — Z79899 Other long term (current) drug therapy: Secondary | ICD-10-CM | POA: Diagnosis not present

## 2018-11-23 DIAGNOSIS — D649 Anemia, unspecified: Secondary | ICD-10-CM | POA: Diagnosis not present

## 2018-11-23 DIAGNOSIS — K210 Gastro-esophageal reflux disease with esophagitis: Secondary | ICD-10-CM | POA: Diagnosis not present

## 2018-12-07 DIAGNOSIS — M810 Age-related osteoporosis without current pathological fracture: Secondary | ICD-10-CM | POA: Diagnosis not present

## 2018-12-07 DIAGNOSIS — J449 Chronic obstructive pulmonary disease, unspecified: Secondary | ICD-10-CM | POA: Diagnosis not present

## 2018-12-07 DIAGNOSIS — K210 Gastro-esophageal reflux disease with esophagitis: Secondary | ICD-10-CM | POA: Diagnosis not present

## 2018-12-07 DIAGNOSIS — R1319 Other dysphagia: Secondary | ICD-10-CM | POA: Diagnosis not present

## 2018-12-07 DIAGNOSIS — M6281 Muscle weakness (generalized): Secondary | ICD-10-CM | POA: Diagnosis not present

## 2018-12-07 DIAGNOSIS — R41841 Cognitive communication deficit: Secondary | ICD-10-CM | POA: Diagnosis not present

## 2018-12-07 DIAGNOSIS — I639 Cerebral infarction, unspecified: Secondary | ICD-10-CM | POA: Diagnosis not present

## 2018-12-07 DIAGNOSIS — R2689 Other abnormalities of gait and mobility: Secondary | ICD-10-CM | POA: Diagnosis not present

## 2018-12-07 DIAGNOSIS — I209 Angina pectoris, unspecified: Secondary | ICD-10-CM | POA: Diagnosis not present

## 2018-12-07 DIAGNOSIS — Z741 Need for assistance with personal care: Secondary | ICD-10-CM | POA: Diagnosis not present

## 2018-12-07 DIAGNOSIS — R278 Other lack of coordination: Secondary | ICD-10-CM | POA: Diagnosis not present

## 2018-12-08 DIAGNOSIS — K219 Gastro-esophageal reflux disease without esophagitis: Secondary | ICD-10-CM | POA: Diagnosis not present

## 2018-12-08 DIAGNOSIS — I209 Angina pectoris, unspecified: Secondary | ICD-10-CM | POA: Diagnosis not present

## 2018-12-08 DIAGNOSIS — F320 Major depressive disorder, single episode, mild: Secondary | ICD-10-CM | POA: Diagnosis not present

## 2018-12-08 DIAGNOSIS — Z741 Need for assistance with personal care: Secondary | ICD-10-CM | POA: Diagnosis not present

## 2018-12-08 DIAGNOSIS — R2689 Other abnormalities of gait and mobility: Secondary | ICD-10-CM | POA: Diagnosis not present

## 2018-12-08 DIAGNOSIS — M6281 Muscle weakness (generalized): Secondary | ICD-10-CM | POA: Diagnosis not present

## 2018-12-08 DIAGNOSIS — R1319 Other dysphagia: Secondary | ICD-10-CM | POA: Diagnosis not present

## 2018-12-08 DIAGNOSIS — F418 Other specified anxiety disorders: Secondary | ICD-10-CM | POA: Diagnosis not present

## 2018-12-08 DIAGNOSIS — N3281 Overactive bladder: Secondary | ICD-10-CM | POA: Diagnosis not present

## 2018-12-08 DIAGNOSIS — J449 Chronic obstructive pulmonary disease, unspecified: Secondary | ICD-10-CM | POA: Diagnosis not present

## 2018-12-08 DIAGNOSIS — M810 Age-related osteoporosis without current pathological fracture: Secondary | ICD-10-CM | POA: Diagnosis not present

## 2018-12-08 DIAGNOSIS — I6389 Other cerebral infarction: Secondary | ICD-10-CM | POA: Diagnosis not present

## 2018-12-09 DIAGNOSIS — R2689 Other abnormalities of gait and mobility: Secondary | ICD-10-CM | POA: Diagnosis not present

## 2018-12-09 DIAGNOSIS — R1319 Other dysphagia: Secondary | ICD-10-CM | POA: Diagnosis not present

## 2018-12-09 DIAGNOSIS — M6281 Muscle weakness (generalized): Secondary | ICD-10-CM | POA: Diagnosis not present

## 2018-12-09 DIAGNOSIS — I209 Angina pectoris, unspecified: Secondary | ICD-10-CM | POA: Diagnosis not present

## 2018-12-09 DIAGNOSIS — Z741 Need for assistance with personal care: Secondary | ICD-10-CM | POA: Diagnosis not present

## 2018-12-09 DIAGNOSIS — J449 Chronic obstructive pulmonary disease, unspecified: Secondary | ICD-10-CM | POA: Diagnosis not present

## 2018-12-10 DIAGNOSIS — M6281 Muscle weakness (generalized): Secondary | ICD-10-CM | POA: Diagnosis not present

## 2018-12-10 DIAGNOSIS — R2689 Other abnormalities of gait and mobility: Secondary | ICD-10-CM | POA: Diagnosis not present

## 2018-12-10 DIAGNOSIS — Z741 Need for assistance with personal care: Secondary | ICD-10-CM | POA: Diagnosis not present

## 2018-12-10 DIAGNOSIS — R1319 Other dysphagia: Secondary | ICD-10-CM | POA: Diagnosis not present

## 2018-12-10 DIAGNOSIS — I209 Angina pectoris, unspecified: Secondary | ICD-10-CM | POA: Diagnosis not present

## 2018-12-10 DIAGNOSIS — J449 Chronic obstructive pulmonary disease, unspecified: Secondary | ICD-10-CM | POA: Diagnosis not present

## 2018-12-13 DIAGNOSIS — R1319 Other dysphagia: Secondary | ICD-10-CM | POA: Diagnosis not present

## 2018-12-13 DIAGNOSIS — Z741 Need for assistance with personal care: Secondary | ICD-10-CM | POA: Diagnosis not present

## 2018-12-13 DIAGNOSIS — I209 Angina pectoris, unspecified: Secondary | ICD-10-CM | POA: Diagnosis not present

## 2018-12-13 DIAGNOSIS — M6281 Muscle weakness (generalized): Secondary | ICD-10-CM | POA: Diagnosis not present

## 2018-12-13 DIAGNOSIS — R2689 Other abnormalities of gait and mobility: Secondary | ICD-10-CM | POA: Diagnosis not present

## 2018-12-13 DIAGNOSIS — J449 Chronic obstructive pulmonary disease, unspecified: Secondary | ICD-10-CM | POA: Diagnosis not present

## 2018-12-14 DIAGNOSIS — I209 Angina pectoris, unspecified: Secondary | ICD-10-CM | POA: Diagnosis not present

## 2018-12-14 DIAGNOSIS — R2689 Other abnormalities of gait and mobility: Secondary | ICD-10-CM | POA: Diagnosis not present

## 2018-12-14 DIAGNOSIS — M6281 Muscle weakness (generalized): Secondary | ICD-10-CM | POA: Diagnosis not present

## 2018-12-14 DIAGNOSIS — R1319 Other dysphagia: Secondary | ICD-10-CM | POA: Diagnosis not present

## 2018-12-14 DIAGNOSIS — J449 Chronic obstructive pulmonary disease, unspecified: Secondary | ICD-10-CM | POA: Diagnosis not present

## 2018-12-14 DIAGNOSIS — Z741 Need for assistance with personal care: Secondary | ICD-10-CM | POA: Diagnosis not present

## 2018-12-15 DIAGNOSIS — R1319 Other dysphagia: Secondary | ICD-10-CM | POA: Diagnosis not present

## 2018-12-15 DIAGNOSIS — J449 Chronic obstructive pulmonary disease, unspecified: Secondary | ICD-10-CM | POA: Diagnosis not present

## 2018-12-15 DIAGNOSIS — R2689 Other abnormalities of gait and mobility: Secondary | ICD-10-CM | POA: Diagnosis not present

## 2018-12-15 DIAGNOSIS — I209 Angina pectoris, unspecified: Secondary | ICD-10-CM | POA: Diagnosis not present

## 2018-12-15 DIAGNOSIS — Z741 Need for assistance with personal care: Secondary | ICD-10-CM | POA: Diagnosis not present

## 2018-12-15 DIAGNOSIS — M6281 Muscle weakness (generalized): Secondary | ICD-10-CM | POA: Diagnosis not present

## 2018-12-16 DIAGNOSIS — J449 Chronic obstructive pulmonary disease, unspecified: Secondary | ICD-10-CM | POA: Diagnosis not present

## 2018-12-16 DIAGNOSIS — Z741 Need for assistance with personal care: Secondary | ICD-10-CM | POA: Diagnosis not present

## 2018-12-16 DIAGNOSIS — R2689 Other abnormalities of gait and mobility: Secondary | ICD-10-CM | POA: Diagnosis not present

## 2018-12-16 DIAGNOSIS — I209 Angina pectoris, unspecified: Secondary | ICD-10-CM | POA: Diagnosis not present

## 2018-12-16 DIAGNOSIS — M6281 Muscle weakness (generalized): Secondary | ICD-10-CM | POA: Diagnosis not present

## 2018-12-16 DIAGNOSIS — R1319 Other dysphagia: Secondary | ICD-10-CM | POA: Diagnosis not present

## 2018-12-17 DIAGNOSIS — R2689 Other abnormalities of gait and mobility: Secondary | ICD-10-CM | POA: Diagnosis not present

## 2018-12-17 DIAGNOSIS — R1319 Other dysphagia: Secondary | ICD-10-CM | POA: Diagnosis not present

## 2018-12-17 DIAGNOSIS — M6281 Muscle weakness (generalized): Secondary | ICD-10-CM | POA: Diagnosis not present

## 2018-12-17 DIAGNOSIS — Z741 Need for assistance with personal care: Secondary | ICD-10-CM | POA: Diagnosis not present

## 2018-12-17 DIAGNOSIS — I209 Angina pectoris, unspecified: Secondary | ICD-10-CM | POA: Diagnosis not present

## 2018-12-17 DIAGNOSIS — J449 Chronic obstructive pulmonary disease, unspecified: Secondary | ICD-10-CM | POA: Diagnosis not present

## 2018-12-20 DIAGNOSIS — R1319 Other dysphagia: Secondary | ICD-10-CM | POA: Diagnosis not present

## 2018-12-20 DIAGNOSIS — M6281 Muscle weakness (generalized): Secondary | ICD-10-CM | POA: Diagnosis not present

## 2018-12-20 DIAGNOSIS — R2689 Other abnormalities of gait and mobility: Secondary | ICD-10-CM | POA: Diagnosis not present

## 2018-12-20 DIAGNOSIS — I209 Angina pectoris, unspecified: Secondary | ICD-10-CM | POA: Diagnosis not present

## 2018-12-20 DIAGNOSIS — Z741 Need for assistance with personal care: Secondary | ICD-10-CM | POA: Diagnosis not present

## 2018-12-20 DIAGNOSIS — J449 Chronic obstructive pulmonary disease, unspecified: Secondary | ICD-10-CM | POA: Diagnosis not present

## 2018-12-21 DIAGNOSIS — I209 Angina pectoris, unspecified: Secondary | ICD-10-CM | POA: Diagnosis not present

## 2018-12-21 DIAGNOSIS — J449 Chronic obstructive pulmonary disease, unspecified: Secondary | ICD-10-CM | POA: Diagnosis not present

## 2018-12-21 DIAGNOSIS — M6281 Muscle weakness (generalized): Secondary | ICD-10-CM | POA: Diagnosis not present

## 2018-12-21 DIAGNOSIS — R2689 Other abnormalities of gait and mobility: Secondary | ICD-10-CM | POA: Diagnosis not present

## 2018-12-21 DIAGNOSIS — Z741 Need for assistance with personal care: Secondary | ICD-10-CM | POA: Diagnosis not present

## 2018-12-21 DIAGNOSIS — R1319 Other dysphagia: Secondary | ICD-10-CM | POA: Diagnosis not present

## 2018-12-22 DIAGNOSIS — R1319 Other dysphagia: Secondary | ICD-10-CM | POA: Diagnosis not present

## 2018-12-22 DIAGNOSIS — Z741 Need for assistance with personal care: Secondary | ICD-10-CM | POA: Diagnosis not present

## 2018-12-22 DIAGNOSIS — I209 Angina pectoris, unspecified: Secondary | ICD-10-CM | POA: Diagnosis not present

## 2018-12-22 DIAGNOSIS — R2689 Other abnormalities of gait and mobility: Secondary | ICD-10-CM | POA: Diagnosis not present

## 2018-12-22 DIAGNOSIS — J449 Chronic obstructive pulmonary disease, unspecified: Secondary | ICD-10-CM | POA: Diagnosis not present

## 2018-12-22 DIAGNOSIS — M6281 Muscle weakness (generalized): Secondary | ICD-10-CM | POA: Diagnosis not present

## 2018-12-23 DIAGNOSIS — M6281 Muscle weakness (generalized): Secondary | ICD-10-CM | POA: Diagnosis not present

## 2018-12-23 DIAGNOSIS — Z741 Need for assistance with personal care: Secondary | ICD-10-CM | POA: Diagnosis not present

## 2018-12-23 DIAGNOSIS — I209 Angina pectoris, unspecified: Secondary | ICD-10-CM | POA: Diagnosis not present

## 2018-12-23 DIAGNOSIS — R2689 Other abnormalities of gait and mobility: Secondary | ICD-10-CM | POA: Diagnosis not present

## 2018-12-23 DIAGNOSIS — J449 Chronic obstructive pulmonary disease, unspecified: Secondary | ICD-10-CM | POA: Diagnosis not present

## 2018-12-23 DIAGNOSIS — R1319 Other dysphagia: Secondary | ICD-10-CM | POA: Diagnosis not present

## 2018-12-24 DIAGNOSIS — Z741 Need for assistance with personal care: Secondary | ICD-10-CM | POA: Diagnosis not present

## 2018-12-24 DIAGNOSIS — M6281 Muscle weakness (generalized): Secondary | ICD-10-CM | POA: Diagnosis not present

## 2018-12-24 DIAGNOSIS — R1319 Other dysphagia: Secondary | ICD-10-CM | POA: Diagnosis not present

## 2018-12-24 DIAGNOSIS — J449 Chronic obstructive pulmonary disease, unspecified: Secondary | ICD-10-CM | POA: Diagnosis not present

## 2018-12-24 DIAGNOSIS — I209 Angina pectoris, unspecified: Secondary | ICD-10-CM | POA: Diagnosis not present

## 2018-12-24 DIAGNOSIS — R2689 Other abnormalities of gait and mobility: Secondary | ICD-10-CM | POA: Diagnosis not present

## 2018-12-27 DIAGNOSIS — R2689 Other abnormalities of gait and mobility: Secondary | ICD-10-CM | POA: Diagnosis not present

## 2018-12-27 DIAGNOSIS — M6281 Muscle weakness (generalized): Secondary | ICD-10-CM | POA: Diagnosis not present

## 2018-12-27 DIAGNOSIS — R1319 Other dysphagia: Secondary | ICD-10-CM | POA: Diagnosis not present

## 2018-12-27 DIAGNOSIS — Z741 Need for assistance with personal care: Secondary | ICD-10-CM | POA: Diagnosis not present

## 2018-12-27 DIAGNOSIS — I209 Angina pectoris, unspecified: Secondary | ICD-10-CM | POA: Diagnosis not present

## 2018-12-27 DIAGNOSIS — J449 Chronic obstructive pulmonary disease, unspecified: Secondary | ICD-10-CM | POA: Diagnosis not present

## 2018-12-28 DIAGNOSIS — I209 Angina pectoris, unspecified: Secondary | ICD-10-CM | POA: Diagnosis not present

## 2018-12-28 DIAGNOSIS — R1319 Other dysphagia: Secondary | ICD-10-CM | POA: Diagnosis not present

## 2018-12-28 DIAGNOSIS — R2689 Other abnormalities of gait and mobility: Secondary | ICD-10-CM | POA: Diagnosis not present

## 2018-12-28 DIAGNOSIS — M6281 Muscle weakness (generalized): Secondary | ICD-10-CM | POA: Diagnosis not present

## 2018-12-28 DIAGNOSIS — J449 Chronic obstructive pulmonary disease, unspecified: Secondary | ICD-10-CM | POA: Diagnosis not present

## 2018-12-28 DIAGNOSIS — Z741 Need for assistance with personal care: Secondary | ICD-10-CM | POA: Diagnosis not present

## 2018-12-29 DIAGNOSIS — R2689 Other abnormalities of gait and mobility: Secondary | ICD-10-CM | POA: Diagnosis not present

## 2018-12-29 DIAGNOSIS — I209 Angina pectoris, unspecified: Secondary | ICD-10-CM | POA: Diagnosis not present

## 2018-12-29 DIAGNOSIS — Z741 Need for assistance with personal care: Secondary | ICD-10-CM | POA: Diagnosis not present

## 2018-12-29 DIAGNOSIS — R1319 Other dysphagia: Secondary | ICD-10-CM | POA: Diagnosis not present

## 2018-12-29 DIAGNOSIS — M6281 Muscle weakness (generalized): Secondary | ICD-10-CM | POA: Diagnosis not present

## 2018-12-29 DIAGNOSIS — J449 Chronic obstructive pulmonary disease, unspecified: Secondary | ICD-10-CM | POA: Diagnosis not present

## 2018-12-30 DIAGNOSIS — R2689 Other abnormalities of gait and mobility: Secondary | ICD-10-CM | POA: Diagnosis not present

## 2018-12-30 DIAGNOSIS — I209 Angina pectoris, unspecified: Secondary | ICD-10-CM | POA: Diagnosis not present

## 2018-12-30 DIAGNOSIS — J449 Chronic obstructive pulmonary disease, unspecified: Secondary | ICD-10-CM | POA: Diagnosis not present

## 2018-12-30 DIAGNOSIS — R1319 Other dysphagia: Secondary | ICD-10-CM | POA: Diagnosis not present

## 2018-12-30 DIAGNOSIS — Z741 Need for assistance with personal care: Secondary | ICD-10-CM | POA: Diagnosis not present

## 2018-12-30 DIAGNOSIS — M6281 Muscle weakness (generalized): Secondary | ICD-10-CM | POA: Diagnosis not present

## 2018-12-31 DIAGNOSIS — J449 Chronic obstructive pulmonary disease, unspecified: Secondary | ICD-10-CM | POA: Diagnosis not present

## 2018-12-31 DIAGNOSIS — Z741 Need for assistance with personal care: Secondary | ICD-10-CM | POA: Diagnosis not present

## 2018-12-31 DIAGNOSIS — R2689 Other abnormalities of gait and mobility: Secondary | ICD-10-CM | POA: Diagnosis not present

## 2018-12-31 DIAGNOSIS — M6281 Muscle weakness (generalized): Secondary | ICD-10-CM | POA: Diagnosis not present

## 2018-12-31 DIAGNOSIS — I209 Angina pectoris, unspecified: Secondary | ICD-10-CM | POA: Diagnosis not present

## 2018-12-31 DIAGNOSIS — R1319 Other dysphagia: Secondary | ICD-10-CM | POA: Diagnosis not present

## 2019-01-03 DIAGNOSIS — R2689 Other abnormalities of gait and mobility: Secondary | ICD-10-CM | POA: Diagnosis not present

## 2019-01-03 DIAGNOSIS — R1310 Dysphagia, unspecified: Secondary | ICD-10-CM | POA: Diagnosis not present

## 2019-01-03 DIAGNOSIS — I209 Angina pectoris, unspecified: Secondary | ICD-10-CM | POA: Diagnosis not present

## 2019-01-03 DIAGNOSIS — K210 Gastro-esophageal reflux disease with esophagitis: Secondary | ICD-10-CM | POA: Diagnosis not present

## 2019-01-03 DIAGNOSIS — I639 Cerebral infarction, unspecified: Secondary | ICD-10-CM | POA: Diagnosis not present

## 2019-01-03 DIAGNOSIS — R1319 Other dysphagia: Secondary | ICD-10-CM | POA: Diagnosis not present

## 2019-01-03 DIAGNOSIS — J449 Chronic obstructive pulmonary disease, unspecified: Secondary | ICD-10-CM | POA: Diagnosis not present

## 2019-01-03 DIAGNOSIS — M810 Age-related osteoporosis without current pathological fracture: Secondary | ICD-10-CM | POA: Diagnosis not present

## 2019-01-03 DIAGNOSIS — Z741 Need for assistance with personal care: Secondary | ICD-10-CM | POA: Diagnosis not present

## 2019-01-03 DIAGNOSIS — M6281 Muscle weakness (generalized): Secondary | ICD-10-CM | POA: Diagnosis not present

## 2019-01-03 DIAGNOSIS — R278 Other lack of coordination: Secondary | ICD-10-CM | POA: Diagnosis not present

## 2019-01-03 DIAGNOSIS — R41841 Cognitive communication deficit: Secondary | ICD-10-CM | POA: Diagnosis not present

## 2019-01-04 DIAGNOSIS — I209 Angina pectoris, unspecified: Secondary | ICD-10-CM | POA: Diagnosis not present

## 2019-01-04 DIAGNOSIS — R2689 Other abnormalities of gait and mobility: Secondary | ICD-10-CM | POA: Diagnosis not present

## 2019-01-04 DIAGNOSIS — J449 Chronic obstructive pulmonary disease, unspecified: Secondary | ICD-10-CM | POA: Diagnosis not present

## 2019-01-04 DIAGNOSIS — R1319 Other dysphagia: Secondary | ICD-10-CM | POA: Diagnosis not present

## 2019-01-04 DIAGNOSIS — Z741 Need for assistance with personal care: Secondary | ICD-10-CM | POA: Diagnosis not present

## 2019-01-04 DIAGNOSIS — M6281 Muscle weakness (generalized): Secondary | ICD-10-CM | POA: Diagnosis not present

## 2019-01-05 DIAGNOSIS — M6281 Muscle weakness (generalized): Secondary | ICD-10-CM | POA: Diagnosis not present

## 2019-01-05 DIAGNOSIS — R1319 Other dysphagia: Secondary | ICD-10-CM | POA: Diagnosis not present

## 2019-01-05 DIAGNOSIS — R2689 Other abnormalities of gait and mobility: Secondary | ICD-10-CM | POA: Diagnosis not present

## 2019-01-05 DIAGNOSIS — Z741 Need for assistance with personal care: Secondary | ICD-10-CM | POA: Diagnosis not present

## 2019-01-05 DIAGNOSIS — J449 Chronic obstructive pulmonary disease, unspecified: Secondary | ICD-10-CM | POA: Diagnosis not present

## 2019-01-05 DIAGNOSIS — I209 Angina pectoris, unspecified: Secondary | ICD-10-CM | POA: Diagnosis not present

## 2019-01-06 DIAGNOSIS — R2689 Other abnormalities of gait and mobility: Secondary | ICD-10-CM | POA: Diagnosis not present

## 2019-01-06 DIAGNOSIS — R1319 Other dysphagia: Secondary | ICD-10-CM | POA: Diagnosis not present

## 2019-01-06 DIAGNOSIS — Z741 Need for assistance with personal care: Secondary | ICD-10-CM | POA: Diagnosis not present

## 2019-01-06 DIAGNOSIS — M6281 Muscle weakness (generalized): Secondary | ICD-10-CM | POA: Diagnosis not present

## 2019-01-06 DIAGNOSIS — I209 Angina pectoris, unspecified: Secondary | ICD-10-CM | POA: Diagnosis not present

## 2019-01-06 DIAGNOSIS — J449 Chronic obstructive pulmonary disease, unspecified: Secondary | ICD-10-CM | POA: Diagnosis not present

## 2019-01-07 DIAGNOSIS — J449 Chronic obstructive pulmonary disease, unspecified: Secondary | ICD-10-CM | POA: Diagnosis not present

## 2019-01-07 DIAGNOSIS — Z741 Need for assistance with personal care: Secondary | ICD-10-CM | POA: Diagnosis not present

## 2019-01-07 DIAGNOSIS — R2689 Other abnormalities of gait and mobility: Secondary | ICD-10-CM | POA: Diagnosis not present

## 2019-01-07 DIAGNOSIS — R1319 Other dysphagia: Secondary | ICD-10-CM | POA: Diagnosis not present

## 2019-01-07 DIAGNOSIS — M6281 Muscle weakness (generalized): Secondary | ICD-10-CM | POA: Diagnosis not present

## 2019-01-07 DIAGNOSIS — I209 Angina pectoris, unspecified: Secondary | ICD-10-CM | POA: Diagnosis not present

## 2019-01-10 DIAGNOSIS — M6281 Muscle weakness (generalized): Secondary | ICD-10-CM | POA: Diagnosis not present

## 2019-01-10 DIAGNOSIS — R2689 Other abnormalities of gait and mobility: Secondary | ICD-10-CM | POA: Diagnosis not present

## 2019-01-10 DIAGNOSIS — R1319 Other dysphagia: Secondary | ICD-10-CM | POA: Diagnosis not present

## 2019-01-10 DIAGNOSIS — J449 Chronic obstructive pulmonary disease, unspecified: Secondary | ICD-10-CM | POA: Diagnosis not present

## 2019-01-10 DIAGNOSIS — Z741 Need for assistance with personal care: Secondary | ICD-10-CM | POA: Diagnosis not present

## 2019-01-10 DIAGNOSIS — I209 Angina pectoris, unspecified: Secondary | ICD-10-CM | POA: Diagnosis not present

## 2019-01-11 DIAGNOSIS — R1319 Other dysphagia: Secondary | ICD-10-CM | POA: Diagnosis not present

## 2019-01-11 DIAGNOSIS — J449 Chronic obstructive pulmonary disease, unspecified: Secondary | ICD-10-CM | POA: Diagnosis not present

## 2019-01-11 DIAGNOSIS — R2689 Other abnormalities of gait and mobility: Secondary | ICD-10-CM | POA: Diagnosis not present

## 2019-01-11 DIAGNOSIS — M6281 Muscle weakness (generalized): Secondary | ICD-10-CM | POA: Diagnosis not present

## 2019-01-11 DIAGNOSIS — Z741 Need for assistance with personal care: Secondary | ICD-10-CM | POA: Diagnosis not present

## 2019-01-11 DIAGNOSIS — I209 Angina pectoris, unspecified: Secondary | ICD-10-CM | POA: Diagnosis not present

## 2019-01-12 DIAGNOSIS — I209 Angina pectoris, unspecified: Secondary | ICD-10-CM | POA: Diagnosis not present

## 2019-01-12 DIAGNOSIS — Z741 Need for assistance with personal care: Secondary | ICD-10-CM | POA: Diagnosis not present

## 2019-01-12 DIAGNOSIS — R1319 Other dysphagia: Secondary | ICD-10-CM | POA: Diagnosis not present

## 2019-01-12 DIAGNOSIS — J449 Chronic obstructive pulmonary disease, unspecified: Secondary | ICD-10-CM | POA: Diagnosis not present

## 2019-01-12 DIAGNOSIS — M6281 Muscle weakness (generalized): Secondary | ICD-10-CM | POA: Diagnosis not present

## 2019-01-12 DIAGNOSIS — R2689 Other abnormalities of gait and mobility: Secondary | ICD-10-CM | POA: Diagnosis not present

## 2019-01-13 DIAGNOSIS — I209 Angina pectoris, unspecified: Secondary | ICD-10-CM | POA: Diagnosis not present

## 2019-01-13 DIAGNOSIS — R1319 Other dysphagia: Secondary | ICD-10-CM | POA: Diagnosis not present

## 2019-01-13 DIAGNOSIS — Z741 Need for assistance with personal care: Secondary | ICD-10-CM | POA: Diagnosis not present

## 2019-01-13 DIAGNOSIS — J449 Chronic obstructive pulmonary disease, unspecified: Secondary | ICD-10-CM | POA: Diagnosis not present

## 2019-01-13 DIAGNOSIS — R2689 Other abnormalities of gait and mobility: Secondary | ICD-10-CM | POA: Diagnosis not present

## 2019-01-13 DIAGNOSIS — M6281 Muscle weakness (generalized): Secondary | ICD-10-CM | POA: Diagnosis not present

## 2019-01-14 DIAGNOSIS — Z741 Need for assistance with personal care: Secondary | ICD-10-CM | POA: Diagnosis not present

## 2019-01-14 DIAGNOSIS — M6281 Muscle weakness (generalized): Secondary | ICD-10-CM | POA: Diagnosis not present

## 2019-01-14 DIAGNOSIS — I209 Angina pectoris, unspecified: Secondary | ICD-10-CM | POA: Diagnosis not present

## 2019-01-14 DIAGNOSIS — J449 Chronic obstructive pulmonary disease, unspecified: Secondary | ICD-10-CM | POA: Diagnosis not present

## 2019-01-14 DIAGNOSIS — R2689 Other abnormalities of gait and mobility: Secondary | ICD-10-CM | POA: Diagnosis not present

## 2019-01-14 DIAGNOSIS — R1319 Other dysphagia: Secondary | ICD-10-CM | POA: Diagnosis not present

## 2019-01-15 DIAGNOSIS — M6281 Muscle weakness (generalized): Secondary | ICD-10-CM | POA: Diagnosis not present

## 2019-01-15 DIAGNOSIS — R1319 Other dysphagia: Secondary | ICD-10-CM | POA: Diagnosis not present

## 2019-01-15 DIAGNOSIS — I209 Angina pectoris, unspecified: Secondary | ICD-10-CM | POA: Diagnosis not present

## 2019-01-15 DIAGNOSIS — J449 Chronic obstructive pulmonary disease, unspecified: Secondary | ICD-10-CM | POA: Diagnosis not present

## 2019-01-15 DIAGNOSIS — Z741 Need for assistance with personal care: Secondary | ICD-10-CM | POA: Diagnosis not present

## 2019-01-15 DIAGNOSIS — R2689 Other abnormalities of gait and mobility: Secondary | ICD-10-CM | POA: Diagnosis not present

## 2019-01-17 DIAGNOSIS — Z741 Need for assistance with personal care: Secondary | ICD-10-CM | POA: Diagnosis not present

## 2019-01-17 DIAGNOSIS — R3915 Urgency of urination: Secondary | ICD-10-CM | POA: Diagnosis not present

## 2019-01-17 DIAGNOSIS — R2689 Other abnormalities of gait and mobility: Secondary | ICD-10-CM | POA: Diagnosis not present

## 2019-01-17 DIAGNOSIS — N3281 Overactive bladder: Secondary | ICD-10-CM | POA: Diagnosis not present

## 2019-01-17 DIAGNOSIS — J449 Chronic obstructive pulmonary disease, unspecified: Secondary | ICD-10-CM | POA: Diagnosis not present

## 2019-01-17 DIAGNOSIS — M6281 Muscle weakness (generalized): Secondary | ICD-10-CM | POA: Diagnosis not present

## 2019-01-17 DIAGNOSIS — R1319 Other dysphagia: Secondary | ICD-10-CM | POA: Diagnosis not present

## 2019-01-17 DIAGNOSIS — I209 Angina pectoris, unspecified: Secondary | ICD-10-CM | POA: Diagnosis not present

## 2019-01-18 DIAGNOSIS — I209 Angina pectoris, unspecified: Secondary | ICD-10-CM | POA: Diagnosis not present

## 2019-01-18 DIAGNOSIS — M6281 Muscle weakness (generalized): Secondary | ICD-10-CM | POA: Diagnosis not present

## 2019-01-18 DIAGNOSIS — N390 Urinary tract infection, site not specified: Secondary | ICD-10-CM | POA: Diagnosis not present

## 2019-01-18 DIAGNOSIS — R319 Hematuria, unspecified: Secondary | ICD-10-CM | POA: Diagnosis not present

## 2019-01-18 DIAGNOSIS — J449 Chronic obstructive pulmonary disease, unspecified: Secondary | ICD-10-CM | POA: Diagnosis not present

## 2019-01-18 DIAGNOSIS — R972 Elevated prostate specific antigen [PSA]: Secondary | ICD-10-CM | POA: Diagnosis not present

## 2019-01-18 DIAGNOSIS — Z741 Need for assistance with personal care: Secondary | ICD-10-CM | POA: Diagnosis not present

## 2019-01-18 DIAGNOSIS — R1319 Other dysphagia: Secondary | ICD-10-CM | POA: Diagnosis not present

## 2019-01-18 DIAGNOSIS — R2689 Other abnormalities of gait and mobility: Secondary | ICD-10-CM | POA: Diagnosis not present

## 2019-01-20 DIAGNOSIS — R350 Frequency of micturition: Secondary | ICD-10-CM | POA: Diagnosis not present

## 2019-01-20 DIAGNOSIS — Z9079 Acquired absence of other genital organ(s): Secondary | ICD-10-CM | POA: Diagnosis not present

## 2019-01-21 DIAGNOSIS — R2689 Other abnormalities of gait and mobility: Secondary | ICD-10-CM | POA: Diagnosis not present

## 2019-01-21 DIAGNOSIS — Z741 Need for assistance with personal care: Secondary | ICD-10-CM | POA: Diagnosis not present

## 2019-01-21 DIAGNOSIS — J449 Chronic obstructive pulmonary disease, unspecified: Secondary | ICD-10-CM | POA: Diagnosis not present

## 2019-01-21 DIAGNOSIS — M6281 Muscle weakness (generalized): Secondary | ICD-10-CM | POA: Diagnosis not present

## 2019-01-21 DIAGNOSIS — I209 Angina pectoris, unspecified: Secondary | ICD-10-CM | POA: Diagnosis not present

## 2019-01-21 DIAGNOSIS — R1319 Other dysphagia: Secondary | ICD-10-CM | POA: Diagnosis not present

## 2019-01-24 DIAGNOSIS — Z741 Need for assistance with personal care: Secondary | ICD-10-CM | POA: Diagnosis not present

## 2019-01-24 DIAGNOSIS — R2689 Other abnormalities of gait and mobility: Secondary | ICD-10-CM | POA: Diagnosis not present

## 2019-01-24 DIAGNOSIS — M6281 Muscle weakness (generalized): Secondary | ICD-10-CM | POA: Diagnosis not present

## 2019-01-24 DIAGNOSIS — J3089 Other allergic rhinitis: Secondary | ICD-10-CM | POA: Diagnosis not present

## 2019-01-24 DIAGNOSIS — J449 Chronic obstructive pulmonary disease, unspecified: Secondary | ICD-10-CM | POA: Diagnosis not present

## 2019-01-24 DIAGNOSIS — I209 Angina pectoris, unspecified: Secondary | ICD-10-CM | POA: Diagnosis not present

## 2019-01-24 DIAGNOSIS — R1319 Other dysphagia: Secondary | ICD-10-CM | POA: Diagnosis not present

## 2019-01-25 DIAGNOSIS — R2689 Other abnormalities of gait and mobility: Secondary | ICD-10-CM | POA: Diagnosis not present

## 2019-01-25 DIAGNOSIS — R1319 Other dysphagia: Secondary | ICD-10-CM | POA: Diagnosis not present

## 2019-01-25 DIAGNOSIS — Z741 Need for assistance with personal care: Secondary | ICD-10-CM | POA: Diagnosis not present

## 2019-01-25 DIAGNOSIS — M6281 Muscle weakness (generalized): Secondary | ICD-10-CM | POA: Diagnosis not present

## 2019-01-25 DIAGNOSIS — J449 Chronic obstructive pulmonary disease, unspecified: Secondary | ICD-10-CM | POA: Diagnosis not present

## 2019-01-25 DIAGNOSIS — I209 Angina pectoris, unspecified: Secondary | ICD-10-CM | POA: Diagnosis not present

## 2019-01-26 DIAGNOSIS — M6281 Muscle weakness (generalized): Secondary | ICD-10-CM | POA: Diagnosis not present

## 2019-01-26 DIAGNOSIS — R2689 Other abnormalities of gait and mobility: Secondary | ICD-10-CM | POA: Diagnosis not present

## 2019-01-26 DIAGNOSIS — J449 Chronic obstructive pulmonary disease, unspecified: Secondary | ICD-10-CM | POA: Diagnosis not present

## 2019-01-26 DIAGNOSIS — I209 Angina pectoris, unspecified: Secondary | ICD-10-CM | POA: Diagnosis not present

## 2019-01-26 DIAGNOSIS — Z741 Need for assistance with personal care: Secondary | ICD-10-CM | POA: Diagnosis not present

## 2019-01-26 DIAGNOSIS — R1319 Other dysphagia: Secondary | ICD-10-CM | POA: Diagnosis not present

## 2019-01-27 DIAGNOSIS — R1319 Other dysphagia: Secondary | ICD-10-CM | POA: Diagnosis not present

## 2019-01-27 DIAGNOSIS — Z741 Need for assistance with personal care: Secondary | ICD-10-CM | POA: Diagnosis not present

## 2019-01-27 DIAGNOSIS — M6281 Muscle weakness (generalized): Secondary | ICD-10-CM | POA: Diagnosis not present

## 2019-01-27 DIAGNOSIS — J449 Chronic obstructive pulmonary disease, unspecified: Secondary | ICD-10-CM | POA: Diagnosis not present

## 2019-01-27 DIAGNOSIS — I209 Angina pectoris, unspecified: Secondary | ICD-10-CM | POA: Diagnosis not present

## 2019-01-27 DIAGNOSIS — R2689 Other abnormalities of gait and mobility: Secondary | ICD-10-CM | POA: Diagnosis not present

## 2019-02-04 DIAGNOSIS — B3789 Other sites of candidiasis: Secondary | ICD-10-CM | POA: Diagnosis not present

## 2019-02-14 DIAGNOSIS — M810 Age-related osteoporosis without current pathological fracture: Secondary | ICD-10-CM | POA: Diagnosis not present

## 2019-02-14 DIAGNOSIS — I6389 Other cerebral infarction: Secondary | ICD-10-CM | POA: Diagnosis not present

## 2019-02-14 DIAGNOSIS — N3281 Overactive bladder: Secondary | ICD-10-CM | POA: Diagnosis not present

## 2019-02-14 DIAGNOSIS — K5909 Other constipation: Secondary | ICD-10-CM | POA: Diagnosis not present

## 2019-02-14 DIAGNOSIS — F320 Major depressive disorder, single episode, mild: Secondary | ICD-10-CM | POA: Diagnosis not present

## 2019-02-14 DIAGNOSIS — K219 Gastro-esophageal reflux disease without esophagitis: Secondary | ICD-10-CM | POA: Diagnosis not present

## 2019-02-14 DIAGNOSIS — J984 Other disorders of lung: Secondary | ICD-10-CM | POA: Diagnosis not present

## 2019-02-14 DIAGNOSIS — E782 Mixed hyperlipidemia: Secondary | ICD-10-CM | POA: Diagnosis not present

## 2019-02-14 DIAGNOSIS — F418 Other specified anxiety disorders: Secondary | ICD-10-CM | POA: Diagnosis not present

## 2019-02-16 DIAGNOSIS — R972 Elevated prostate specific antigen [PSA]: Secondary | ICD-10-CM | POA: Diagnosis not present

## 2019-02-16 DIAGNOSIS — R3915 Urgency of urination: Secondary | ICD-10-CM | POA: Diagnosis not present

## 2019-02-17 DIAGNOSIS — R319 Hematuria, unspecified: Secondary | ICD-10-CM | POA: Diagnosis not present

## 2019-02-17 DIAGNOSIS — N390 Urinary tract infection, site not specified: Secondary | ICD-10-CM | POA: Diagnosis not present

## 2019-02-25 DIAGNOSIS — R278 Other lack of coordination: Secondary | ICD-10-CM | POA: Diagnosis not present

## 2019-02-25 DIAGNOSIS — M810 Age-related osteoporosis without current pathological fracture: Secondary | ICD-10-CM | POA: Diagnosis not present

## 2019-02-25 DIAGNOSIS — K210 Gastro-esophageal reflux disease with esophagitis: Secondary | ICD-10-CM | POA: Diagnosis not present

## 2019-02-25 DIAGNOSIS — R41841 Cognitive communication deficit: Secondary | ICD-10-CM | POA: Diagnosis not present

## 2019-02-25 DIAGNOSIS — J449 Chronic obstructive pulmonary disease, unspecified: Secondary | ICD-10-CM | POA: Diagnosis not present

## 2019-02-25 DIAGNOSIS — R1319 Other dysphagia: Secondary | ICD-10-CM | POA: Diagnosis not present

## 2019-02-25 DIAGNOSIS — I639 Cerebral infarction, unspecified: Secondary | ICD-10-CM | POA: Diagnosis not present

## 2019-02-25 DIAGNOSIS — M6281 Muscle weakness (generalized): Secondary | ICD-10-CM | POA: Diagnosis not present

## 2019-02-25 DIAGNOSIS — Z741 Need for assistance with personal care: Secondary | ICD-10-CM | POA: Diagnosis not present

## 2019-02-25 DIAGNOSIS — I209 Angina pectoris, unspecified: Secondary | ICD-10-CM | POA: Diagnosis not present

## 2019-02-25 DIAGNOSIS — R1310 Dysphagia, unspecified: Secondary | ICD-10-CM | POA: Diagnosis not present

## 2019-02-25 DIAGNOSIS — R2689 Other abnormalities of gait and mobility: Secondary | ICD-10-CM | POA: Diagnosis not present

## 2019-02-28 DIAGNOSIS — M6281 Muscle weakness (generalized): Secondary | ICD-10-CM | POA: Diagnosis not present

## 2019-02-28 DIAGNOSIS — R1319 Other dysphagia: Secondary | ICD-10-CM | POA: Diagnosis not present

## 2019-02-28 DIAGNOSIS — J449 Chronic obstructive pulmonary disease, unspecified: Secondary | ICD-10-CM | POA: Diagnosis not present

## 2019-02-28 DIAGNOSIS — Z741 Need for assistance with personal care: Secondary | ICD-10-CM | POA: Diagnosis not present

## 2019-02-28 DIAGNOSIS — I209 Angina pectoris, unspecified: Secondary | ICD-10-CM | POA: Diagnosis not present

## 2019-02-28 DIAGNOSIS — R2689 Other abnormalities of gait and mobility: Secondary | ICD-10-CM | POA: Diagnosis not present

## 2019-03-01 DIAGNOSIS — I209 Angina pectoris, unspecified: Secondary | ICD-10-CM | POA: Diagnosis not present

## 2019-03-01 DIAGNOSIS — R2689 Other abnormalities of gait and mobility: Secondary | ICD-10-CM | POA: Diagnosis not present

## 2019-03-01 DIAGNOSIS — J449 Chronic obstructive pulmonary disease, unspecified: Secondary | ICD-10-CM | POA: Diagnosis not present

## 2019-03-01 DIAGNOSIS — Z741 Need for assistance with personal care: Secondary | ICD-10-CM | POA: Diagnosis not present

## 2019-03-01 DIAGNOSIS — M6281 Muscle weakness (generalized): Secondary | ICD-10-CM | POA: Diagnosis not present

## 2019-03-01 DIAGNOSIS — R1319 Other dysphagia: Secondary | ICD-10-CM | POA: Diagnosis not present

## 2019-03-02 DIAGNOSIS — R1319 Other dysphagia: Secondary | ICD-10-CM | POA: Diagnosis not present

## 2019-03-02 DIAGNOSIS — R2689 Other abnormalities of gait and mobility: Secondary | ICD-10-CM | POA: Diagnosis not present

## 2019-03-02 DIAGNOSIS — Z741 Need for assistance with personal care: Secondary | ICD-10-CM | POA: Diagnosis not present

## 2019-03-02 DIAGNOSIS — M6281 Muscle weakness (generalized): Secondary | ICD-10-CM | POA: Diagnosis not present

## 2019-03-02 DIAGNOSIS — J449 Chronic obstructive pulmonary disease, unspecified: Secondary | ICD-10-CM | POA: Diagnosis not present

## 2019-03-02 DIAGNOSIS — I209 Angina pectoris, unspecified: Secondary | ICD-10-CM | POA: Diagnosis not present

## 2019-03-03 DIAGNOSIS — R1319 Other dysphagia: Secondary | ICD-10-CM | POA: Diagnosis not present

## 2019-03-03 DIAGNOSIS — I209 Angina pectoris, unspecified: Secondary | ICD-10-CM | POA: Diagnosis not present

## 2019-03-03 DIAGNOSIS — M6281 Muscle weakness (generalized): Secondary | ICD-10-CM | POA: Diagnosis not present

## 2019-03-03 DIAGNOSIS — Z741 Need for assistance with personal care: Secondary | ICD-10-CM | POA: Diagnosis not present

## 2019-03-03 DIAGNOSIS — J449 Chronic obstructive pulmonary disease, unspecified: Secondary | ICD-10-CM | POA: Diagnosis not present

## 2019-03-03 DIAGNOSIS — R2689 Other abnormalities of gait and mobility: Secondary | ICD-10-CM | POA: Diagnosis not present

## 2019-03-04 DIAGNOSIS — R41841 Cognitive communication deficit: Secondary | ICD-10-CM | POA: Diagnosis not present

## 2019-03-04 DIAGNOSIS — R2689 Other abnormalities of gait and mobility: Secondary | ICD-10-CM | POA: Diagnosis not present

## 2019-03-04 DIAGNOSIS — M810 Age-related osteoporosis without current pathological fracture: Secondary | ICD-10-CM | POA: Diagnosis not present

## 2019-03-04 DIAGNOSIS — I209 Angina pectoris, unspecified: Secondary | ICD-10-CM | POA: Diagnosis not present

## 2019-03-04 DIAGNOSIS — R278 Other lack of coordination: Secondary | ICD-10-CM | POA: Diagnosis not present

## 2019-03-04 DIAGNOSIS — J449 Chronic obstructive pulmonary disease, unspecified: Secondary | ICD-10-CM | POA: Diagnosis not present

## 2019-03-04 DIAGNOSIS — R1310 Dysphagia, unspecified: Secondary | ICD-10-CM | POA: Diagnosis not present

## 2019-03-04 DIAGNOSIS — Z741 Need for assistance with personal care: Secondary | ICD-10-CM | POA: Diagnosis not present

## 2019-03-04 DIAGNOSIS — M6281 Muscle weakness (generalized): Secondary | ICD-10-CM | POA: Diagnosis not present

## 2019-03-04 DIAGNOSIS — K210 Gastro-esophageal reflux disease with esophagitis: Secondary | ICD-10-CM | POA: Diagnosis not present

## 2019-03-04 DIAGNOSIS — R1319 Other dysphagia: Secondary | ICD-10-CM | POA: Diagnosis not present

## 2019-03-04 DIAGNOSIS — I639 Cerebral infarction, unspecified: Secondary | ICD-10-CM | POA: Diagnosis not present

## 2019-03-07 DIAGNOSIS — R1319 Other dysphagia: Secondary | ICD-10-CM | POA: Diagnosis not present

## 2019-03-07 DIAGNOSIS — R2689 Other abnormalities of gait and mobility: Secondary | ICD-10-CM | POA: Diagnosis not present

## 2019-03-07 DIAGNOSIS — J449 Chronic obstructive pulmonary disease, unspecified: Secondary | ICD-10-CM | POA: Diagnosis not present

## 2019-03-07 DIAGNOSIS — Z741 Need for assistance with personal care: Secondary | ICD-10-CM | POA: Diagnosis not present

## 2019-03-07 DIAGNOSIS — I209 Angina pectoris, unspecified: Secondary | ICD-10-CM | POA: Diagnosis not present

## 2019-03-07 DIAGNOSIS — M6281 Muscle weakness (generalized): Secondary | ICD-10-CM | POA: Diagnosis not present

## 2019-03-08 DIAGNOSIS — Z741 Need for assistance with personal care: Secondary | ICD-10-CM | POA: Diagnosis not present

## 2019-03-08 DIAGNOSIS — R2689 Other abnormalities of gait and mobility: Secondary | ICD-10-CM | POA: Diagnosis not present

## 2019-03-08 DIAGNOSIS — M6281 Muscle weakness (generalized): Secondary | ICD-10-CM | POA: Diagnosis not present

## 2019-03-08 DIAGNOSIS — J449 Chronic obstructive pulmonary disease, unspecified: Secondary | ICD-10-CM | POA: Diagnosis not present

## 2019-03-08 DIAGNOSIS — R1319 Other dysphagia: Secondary | ICD-10-CM | POA: Diagnosis not present

## 2019-03-08 DIAGNOSIS — I209 Angina pectoris, unspecified: Secondary | ICD-10-CM | POA: Diagnosis not present

## 2019-03-09 DIAGNOSIS — I209 Angina pectoris, unspecified: Secondary | ICD-10-CM | POA: Diagnosis not present

## 2019-03-09 DIAGNOSIS — R1319 Other dysphagia: Secondary | ICD-10-CM | POA: Diagnosis not present

## 2019-03-09 DIAGNOSIS — J449 Chronic obstructive pulmonary disease, unspecified: Secondary | ICD-10-CM | POA: Diagnosis not present

## 2019-03-09 DIAGNOSIS — M6281 Muscle weakness (generalized): Secondary | ICD-10-CM | POA: Diagnosis not present

## 2019-03-09 DIAGNOSIS — Z741 Need for assistance with personal care: Secondary | ICD-10-CM | POA: Diagnosis not present

## 2019-03-09 DIAGNOSIS — R2689 Other abnormalities of gait and mobility: Secondary | ICD-10-CM | POA: Diagnosis not present

## 2019-03-10 DIAGNOSIS — J449 Chronic obstructive pulmonary disease, unspecified: Secondary | ICD-10-CM | POA: Diagnosis not present

## 2019-03-10 DIAGNOSIS — Z741 Need for assistance with personal care: Secondary | ICD-10-CM | POA: Diagnosis not present

## 2019-03-10 DIAGNOSIS — I209 Angina pectoris, unspecified: Secondary | ICD-10-CM | POA: Diagnosis not present

## 2019-03-10 DIAGNOSIS — M6281 Muscle weakness (generalized): Secondary | ICD-10-CM | POA: Diagnosis not present

## 2019-03-10 DIAGNOSIS — R2689 Other abnormalities of gait and mobility: Secondary | ICD-10-CM | POA: Diagnosis not present

## 2019-03-10 DIAGNOSIS — R1319 Other dysphagia: Secondary | ICD-10-CM | POA: Diagnosis not present

## 2019-03-11 DIAGNOSIS — R2689 Other abnormalities of gait and mobility: Secondary | ICD-10-CM | POA: Diagnosis not present

## 2019-03-11 DIAGNOSIS — Z741 Need for assistance with personal care: Secondary | ICD-10-CM | POA: Diagnosis not present

## 2019-03-11 DIAGNOSIS — R1319 Other dysphagia: Secondary | ICD-10-CM | POA: Diagnosis not present

## 2019-03-11 DIAGNOSIS — I209 Angina pectoris, unspecified: Secondary | ICD-10-CM | POA: Diagnosis not present

## 2019-03-11 DIAGNOSIS — M6281 Muscle weakness (generalized): Secondary | ICD-10-CM | POA: Diagnosis not present

## 2019-03-11 DIAGNOSIS — J449 Chronic obstructive pulmonary disease, unspecified: Secondary | ICD-10-CM | POA: Diagnosis not present

## 2019-03-14 DIAGNOSIS — R2689 Other abnormalities of gait and mobility: Secondary | ICD-10-CM | POA: Diagnosis not present

## 2019-03-14 DIAGNOSIS — M6281 Muscle weakness (generalized): Secondary | ICD-10-CM | POA: Diagnosis not present

## 2019-03-14 DIAGNOSIS — Z741 Need for assistance with personal care: Secondary | ICD-10-CM | POA: Diagnosis not present

## 2019-03-14 DIAGNOSIS — R1319 Other dysphagia: Secondary | ICD-10-CM | POA: Diagnosis not present

## 2019-03-14 DIAGNOSIS — J449 Chronic obstructive pulmonary disease, unspecified: Secondary | ICD-10-CM | POA: Diagnosis not present

## 2019-03-14 DIAGNOSIS — I209 Angina pectoris, unspecified: Secondary | ICD-10-CM | POA: Diagnosis not present

## 2019-03-15 DIAGNOSIS — J449 Chronic obstructive pulmonary disease, unspecified: Secondary | ICD-10-CM | POA: Diagnosis not present

## 2019-03-15 DIAGNOSIS — I209 Angina pectoris, unspecified: Secondary | ICD-10-CM | POA: Diagnosis not present

## 2019-03-15 DIAGNOSIS — R1319 Other dysphagia: Secondary | ICD-10-CM | POA: Diagnosis not present

## 2019-03-15 DIAGNOSIS — Z741 Need for assistance with personal care: Secondary | ICD-10-CM | POA: Diagnosis not present

## 2019-03-15 DIAGNOSIS — R2689 Other abnormalities of gait and mobility: Secondary | ICD-10-CM | POA: Diagnosis not present

## 2019-03-15 DIAGNOSIS — M6281 Muscle weakness (generalized): Secondary | ICD-10-CM | POA: Diagnosis not present

## 2019-03-16 DIAGNOSIS — R2689 Other abnormalities of gait and mobility: Secondary | ICD-10-CM | POA: Diagnosis not present

## 2019-03-16 DIAGNOSIS — R1319 Other dysphagia: Secondary | ICD-10-CM | POA: Diagnosis not present

## 2019-03-16 DIAGNOSIS — I209 Angina pectoris, unspecified: Secondary | ICD-10-CM | POA: Diagnosis not present

## 2019-03-16 DIAGNOSIS — Z741 Need for assistance with personal care: Secondary | ICD-10-CM | POA: Diagnosis not present

## 2019-03-16 DIAGNOSIS — M6281 Muscle weakness (generalized): Secondary | ICD-10-CM | POA: Diagnosis not present

## 2019-03-16 DIAGNOSIS — J449 Chronic obstructive pulmonary disease, unspecified: Secondary | ICD-10-CM | POA: Diagnosis not present

## 2019-03-17 DIAGNOSIS — Z741 Need for assistance with personal care: Secondary | ICD-10-CM | POA: Diagnosis not present

## 2019-03-17 DIAGNOSIS — M6281 Muscle weakness (generalized): Secondary | ICD-10-CM | POA: Diagnosis not present

## 2019-03-17 DIAGNOSIS — J449 Chronic obstructive pulmonary disease, unspecified: Secondary | ICD-10-CM | POA: Diagnosis not present

## 2019-03-17 DIAGNOSIS — R1319 Other dysphagia: Secondary | ICD-10-CM | POA: Diagnosis not present

## 2019-03-17 DIAGNOSIS — I209 Angina pectoris, unspecified: Secondary | ICD-10-CM | POA: Diagnosis not present

## 2019-03-17 DIAGNOSIS — R2689 Other abnormalities of gait and mobility: Secondary | ICD-10-CM | POA: Diagnosis not present

## 2019-03-18 DIAGNOSIS — M6281 Muscle weakness (generalized): Secondary | ICD-10-CM | POA: Diagnosis not present

## 2019-03-18 DIAGNOSIS — R1319 Other dysphagia: Secondary | ICD-10-CM | POA: Diagnosis not present

## 2019-03-18 DIAGNOSIS — Z741 Need for assistance with personal care: Secondary | ICD-10-CM | POA: Diagnosis not present

## 2019-03-18 DIAGNOSIS — J449 Chronic obstructive pulmonary disease, unspecified: Secondary | ICD-10-CM | POA: Diagnosis not present

## 2019-03-18 DIAGNOSIS — I209 Angina pectoris, unspecified: Secondary | ICD-10-CM | POA: Diagnosis not present

## 2019-03-18 DIAGNOSIS — R2689 Other abnormalities of gait and mobility: Secondary | ICD-10-CM | POA: Diagnosis not present

## 2019-03-21 DIAGNOSIS — R1319 Other dysphagia: Secondary | ICD-10-CM | POA: Diagnosis not present

## 2019-03-21 DIAGNOSIS — Z741 Need for assistance with personal care: Secondary | ICD-10-CM | POA: Diagnosis not present

## 2019-03-21 DIAGNOSIS — J449 Chronic obstructive pulmonary disease, unspecified: Secondary | ICD-10-CM | POA: Diagnosis not present

## 2019-03-21 DIAGNOSIS — M6281 Muscle weakness (generalized): Secondary | ICD-10-CM | POA: Diagnosis not present

## 2019-03-21 DIAGNOSIS — I209 Angina pectoris, unspecified: Secondary | ICD-10-CM | POA: Diagnosis not present

## 2019-03-21 DIAGNOSIS — R2689 Other abnormalities of gait and mobility: Secondary | ICD-10-CM | POA: Diagnosis not present

## 2019-03-22 DIAGNOSIS — E785 Hyperlipidemia, unspecified: Secondary | ICD-10-CM | POA: Diagnosis not present

## 2019-03-22 DIAGNOSIS — R2689 Other abnormalities of gait and mobility: Secondary | ICD-10-CM | POA: Diagnosis not present

## 2019-03-22 DIAGNOSIS — I209 Angina pectoris, unspecified: Secondary | ICD-10-CM | POA: Diagnosis not present

## 2019-03-22 DIAGNOSIS — Z741 Need for assistance with personal care: Secondary | ICD-10-CM | POA: Diagnosis not present

## 2019-03-22 DIAGNOSIS — J449 Chronic obstructive pulmonary disease, unspecified: Secondary | ICD-10-CM | POA: Diagnosis not present

## 2019-03-22 DIAGNOSIS — R1319 Other dysphagia: Secondary | ICD-10-CM | POA: Diagnosis not present

## 2019-03-22 DIAGNOSIS — E782 Mixed hyperlipidemia: Secondary | ICD-10-CM | POA: Diagnosis not present

## 2019-03-22 DIAGNOSIS — Z79899 Other long term (current) drug therapy: Secondary | ICD-10-CM | POA: Diagnosis not present

## 2019-03-22 DIAGNOSIS — E559 Vitamin D deficiency, unspecified: Secondary | ICD-10-CM | POA: Diagnosis not present

## 2019-03-22 DIAGNOSIS — M6281 Muscle weakness (generalized): Secondary | ICD-10-CM | POA: Diagnosis not present

## 2019-03-23 DIAGNOSIS — M6281 Muscle weakness (generalized): Secondary | ICD-10-CM | POA: Diagnosis not present

## 2019-03-23 DIAGNOSIS — Z741 Need for assistance with personal care: Secondary | ICD-10-CM | POA: Diagnosis not present

## 2019-03-23 DIAGNOSIS — I209 Angina pectoris, unspecified: Secondary | ICD-10-CM | POA: Diagnosis not present

## 2019-03-23 DIAGNOSIS — R2689 Other abnormalities of gait and mobility: Secondary | ICD-10-CM | POA: Diagnosis not present

## 2019-03-23 DIAGNOSIS — R1319 Other dysphagia: Secondary | ICD-10-CM | POA: Diagnosis not present

## 2019-03-23 DIAGNOSIS — J449 Chronic obstructive pulmonary disease, unspecified: Secondary | ICD-10-CM | POA: Diagnosis not present

## 2019-03-24 DIAGNOSIS — R2689 Other abnormalities of gait and mobility: Secondary | ICD-10-CM | POA: Diagnosis not present

## 2019-03-24 DIAGNOSIS — M6281 Muscle weakness (generalized): Secondary | ICD-10-CM | POA: Diagnosis not present

## 2019-03-24 DIAGNOSIS — Z741 Need for assistance with personal care: Secondary | ICD-10-CM | POA: Diagnosis not present

## 2019-03-24 DIAGNOSIS — R1319 Other dysphagia: Secondary | ICD-10-CM | POA: Diagnosis not present

## 2019-03-24 DIAGNOSIS — J449 Chronic obstructive pulmonary disease, unspecified: Secondary | ICD-10-CM | POA: Diagnosis not present

## 2019-03-24 DIAGNOSIS — I209 Angina pectoris, unspecified: Secondary | ICD-10-CM | POA: Diagnosis not present

## 2019-03-25 DIAGNOSIS — J449 Chronic obstructive pulmonary disease, unspecified: Secondary | ICD-10-CM | POA: Diagnosis not present

## 2019-03-25 DIAGNOSIS — I209 Angina pectoris, unspecified: Secondary | ICD-10-CM | POA: Diagnosis not present

## 2019-03-25 DIAGNOSIS — M6281 Muscle weakness (generalized): Secondary | ICD-10-CM | POA: Diagnosis not present

## 2019-03-25 DIAGNOSIS — Z741 Need for assistance with personal care: Secondary | ICD-10-CM | POA: Diagnosis not present

## 2019-03-25 DIAGNOSIS — R2689 Other abnormalities of gait and mobility: Secondary | ICD-10-CM | POA: Diagnosis not present

## 2019-03-25 DIAGNOSIS — R1319 Other dysphagia: Secondary | ICD-10-CM | POA: Diagnosis not present

## 2019-03-28 DIAGNOSIS — J449 Chronic obstructive pulmonary disease, unspecified: Secondary | ICD-10-CM | POA: Diagnosis not present

## 2019-03-28 DIAGNOSIS — I209 Angina pectoris, unspecified: Secondary | ICD-10-CM | POA: Diagnosis not present

## 2019-03-28 DIAGNOSIS — Z741 Need for assistance with personal care: Secondary | ICD-10-CM | POA: Diagnosis not present

## 2019-03-28 DIAGNOSIS — M6281 Muscle weakness (generalized): Secondary | ICD-10-CM | POA: Diagnosis not present

## 2019-03-28 DIAGNOSIS — R2689 Other abnormalities of gait and mobility: Secondary | ICD-10-CM | POA: Diagnosis not present

## 2019-03-28 DIAGNOSIS — R1319 Other dysphagia: Secondary | ICD-10-CM | POA: Diagnosis not present

## 2019-03-29 DIAGNOSIS — I209 Angina pectoris, unspecified: Secondary | ICD-10-CM | POA: Diagnosis not present

## 2019-03-29 DIAGNOSIS — M6281 Muscle weakness (generalized): Secondary | ICD-10-CM | POA: Diagnosis not present

## 2019-03-29 DIAGNOSIS — J449 Chronic obstructive pulmonary disease, unspecified: Secondary | ICD-10-CM | POA: Diagnosis not present

## 2019-03-29 DIAGNOSIS — Z741 Need for assistance with personal care: Secondary | ICD-10-CM | POA: Diagnosis not present

## 2019-03-29 DIAGNOSIS — R1319 Other dysphagia: Secondary | ICD-10-CM | POA: Diagnosis not present

## 2019-03-29 DIAGNOSIS — R2689 Other abnormalities of gait and mobility: Secondary | ICD-10-CM | POA: Diagnosis not present

## 2019-03-30 DIAGNOSIS — J449 Chronic obstructive pulmonary disease, unspecified: Secondary | ICD-10-CM | POA: Diagnosis not present

## 2019-03-30 DIAGNOSIS — R2689 Other abnormalities of gait and mobility: Secondary | ICD-10-CM | POA: Diagnosis not present

## 2019-03-30 DIAGNOSIS — R1319 Other dysphagia: Secondary | ICD-10-CM | POA: Diagnosis not present

## 2019-03-30 DIAGNOSIS — Z741 Need for assistance with personal care: Secondary | ICD-10-CM | POA: Diagnosis not present

## 2019-03-30 DIAGNOSIS — I209 Angina pectoris, unspecified: Secondary | ICD-10-CM | POA: Diagnosis not present

## 2019-03-30 DIAGNOSIS — M6281 Muscle weakness (generalized): Secondary | ICD-10-CM | POA: Diagnosis not present

## 2019-03-31 DIAGNOSIS — R2689 Other abnormalities of gait and mobility: Secondary | ICD-10-CM | POA: Diagnosis not present

## 2019-03-31 DIAGNOSIS — R1319 Other dysphagia: Secondary | ICD-10-CM | POA: Diagnosis not present

## 2019-03-31 DIAGNOSIS — Z741 Need for assistance with personal care: Secondary | ICD-10-CM | POA: Diagnosis not present

## 2019-03-31 DIAGNOSIS — M6281 Muscle weakness (generalized): Secondary | ICD-10-CM | POA: Diagnosis not present

## 2019-03-31 DIAGNOSIS — J449 Chronic obstructive pulmonary disease, unspecified: Secondary | ICD-10-CM | POA: Diagnosis not present

## 2019-03-31 DIAGNOSIS — I209 Angina pectoris, unspecified: Secondary | ICD-10-CM | POA: Diagnosis not present

## 2019-04-01 DIAGNOSIS — M6281 Muscle weakness (generalized): Secondary | ICD-10-CM | POA: Diagnosis not present

## 2019-04-01 DIAGNOSIS — J449 Chronic obstructive pulmonary disease, unspecified: Secondary | ICD-10-CM | POA: Diagnosis not present

## 2019-04-01 DIAGNOSIS — H6123 Impacted cerumen, bilateral: Secondary | ICD-10-CM | POA: Diagnosis not present

## 2019-04-01 DIAGNOSIS — R2689 Other abnormalities of gait and mobility: Secondary | ICD-10-CM | POA: Diagnosis not present

## 2019-04-01 DIAGNOSIS — I209 Angina pectoris, unspecified: Secondary | ICD-10-CM | POA: Diagnosis not present

## 2019-04-01 DIAGNOSIS — R1319 Other dysphagia: Secondary | ICD-10-CM | POA: Diagnosis not present

## 2019-04-01 DIAGNOSIS — Z741 Need for assistance with personal care: Secondary | ICD-10-CM | POA: Diagnosis not present

## 2019-04-04 DIAGNOSIS — K219 Gastro-esophageal reflux disease without esophagitis: Secondary | ICD-10-CM | POA: Diagnosis not present

## 2019-04-04 DIAGNOSIS — F418 Other specified anxiety disorders: Secondary | ICD-10-CM | POA: Diagnosis not present

## 2019-04-04 DIAGNOSIS — R2689 Other abnormalities of gait and mobility: Secondary | ICD-10-CM | POA: Diagnosis not present

## 2019-04-04 DIAGNOSIS — M810 Age-related osteoporosis without current pathological fracture: Secondary | ICD-10-CM | POA: Diagnosis not present

## 2019-04-04 DIAGNOSIS — Z741 Need for assistance with personal care: Secondary | ICD-10-CM | POA: Diagnosis not present

## 2019-04-04 DIAGNOSIS — M6281 Muscle weakness (generalized): Secondary | ICD-10-CM | POA: Diagnosis not present

## 2019-04-04 DIAGNOSIS — N3281 Overactive bladder: Secondary | ICD-10-CM | POA: Diagnosis not present

## 2019-04-04 DIAGNOSIS — R278 Other lack of coordination: Secondary | ICD-10-CM | POA: Diagnosis not present

## 2019-04-04 DIAGNOSIS — I639 Cerebral infarction, unspecified: Secondary | ICD-10-CM | POA: Diagnosis not present

## 2019-04-04 DIAGNOSIS — K210 Gastro-esophageal reflux disease with esophagitis: Secondary | ICD-10-CM | POA: Diagnosis not present

## 2019-04-04 DIAGNOSIS — R1319 Other dysphagia: Secondary | ICD-10-CM | POA: Diagnosis not present

## 2019-04-04 DIAGNOSIS — I209 Angina pectoris, unspecified: Secondary | ICD-10-CM | POA: Diagnosis not present

## 2019-04-04 DIAGNOSIS — E782 Mixed hyperlipidemia: Secondary | ICD-10-CM | POA: Diagnosis not present

## 2019-04-04 DIAGNOSIS — R41841 Cognitive communication deficit: Secondary | ICD-10-CM | POA: Diagnosis not present

## 2019-04-04 DIAGNOSIS — J449 Chronic obstructive pulmonary disease, unspecified: Secondary | ICD-10-CM | POA: Diagnosis not present

## 2019-04-04 DIAGNOSIS — R1310 Dysphagia, unspecified: Secondary | ICD-10-CM | POA: Diagnosis not present

## 2019-04-04 DIAGNOSIS — I6389 Other cerebral infarction: Secondary | ICD-10-CM | POA: Diagnosis not present

## 2019-04-05 DIAGNOSIS — Z741 Need for assistance with personal care: Secondary | ICD-10-CM | POA: Diagnosis not present

## 2019-04-05 DIAGNOSIS — J449 Chronic obstructive pulmonary disease, unspecified: Secondary | ICD-10-CM | POA: Diagnosis not present

## 2019-04-05 DIAGNOSIS — I209 Angina pectoris, unspecified: Secondary | ICD-10-CM | POA: Diagnosis not present

## 2019-04-05 DIAGNOSIS — M6281 Muscle weakness (generalized): Secondary | ICD-10-CM | POA: Diagnosis not present

## 2019-04-05 DIAGNOSIS — R1319 Other dysphagia: Secondary | ICD-10-CM | POA: Diagnosis not present

## 2019-04-05 DIAGNOSIS — R2689 Other abnormalities of gait and mobility: Secondary | ICD-10-CM | POA: Diagnosis not present

## 2019-04-06 DIAGNOSIS — Z741 Need for assistance with personal care: Secondary | ICD-10-CM | POA: Diagnosis not present

## 2019-04-06 DIAGNOSIS — R2689 Other abnormalities of gait and mobility: Secondary | ICD-10-CM | POA: Diagnosis not present

## 2019-04-06 DIAGNOSIS — J449 Chronic obstructive pulmonary disease, unspecified: Secondary | ICD-10-CM | POA: Diagnosis not present

## 2019-04-06 DIAGNOSIS — R1319 Other dysphagia: Secondary | ICD-10-CM | POA: Diagnosis not present

## 2019-04-06 DIAGNOSIS — M6281 Muscle weakness (generalized): Secondary | ICD-10-CM | POA: Diagnosis not present

## 2019-04-06 DIAGNOSIS — I209 Angina pectoris, unspecified: Secondary | ICD-10-CM | POA: Diagnosis not present

## 2019-04-07 DIAGNOSIS — M6281 Muscle weakness (generalized): Secondary | ICD-10-CM | POA: Diagnosis not present

## 2019-04-07 DIAGNOSIS — R2689 Other abnormalities of gait and mobility: Secondary | ICD-10-CM | POA: Diagnosis not present

## 2019-04-07 DIAGNOSIS — I209 Angina pectoris, unspecified: Secondary | ICD-10-CM | POA: Diagnosis not present

## 2019-04-07 DIAGNOSIS — J449 Chronic obstructive pulmonary disease, unspecified: Secondary | ICD-10-CM | POA: Diagnosis not present

## 2019-04-07 DIAGNOSIS — Z741 Need for assistance with personal care: Secondary | ICD-10-CM | POA: Diagnosis not present

## 2019-04-07 DIAGNOSIS — R1319 Other dysphagia: Secondary | ICD-10-CM | POA: Diagnosis not present

## 2019-04-08 DIAGNOSIS — R1319 Other dysphagia: Secondary | ICD-10-CM | POA: Diagnosis not present

## 2019-04-08 DIAGNOSIS — Z741 Need for assistance with personal care: Secondary | ICD-10-CM | POA: Diagnosis not present

## 2019-04-08 DIAGNOSIS — R2689 Other abnormalities of gait and mobility: Secondary | ICD-10-CM | POA: Diagnosis not present

## 2019-04-08 DIAGNOSIS — I209 Angina pectoris, unspecified: Secondary | ICD-10-CM | POA: Diagnosis not present

## 2019-04-08 DIAGNOSIS — J449 Chronic obstructive pulmonary disease, unspecified: Secondary | ICD-10-CM | POA: Diagnosis not present

## 2019-04-08 DIAGNOSIS — M6281 Muscle weakness (generalized): Secondary | ICD-10-CM | POA: Diagnosis not present

## 2019-04-11 DIAGNOSIS — R1319 Other dysphagia: Secondary | ICD-10-CM | POA: Diagnosis not present

## 2019-04-11 DIAGNOSIS — J449 Chronic obstructive pulmonary disease, unspecified: Secondary | ICD-10-CM | POA: Diagnosis not present

## 2019-04-11 DIAGNOSIS — R2689 Other abnormalities of gait and mobility: Secondary | ICD-10-CM | POA: Diagnosis not present

## 2019-04-11 DIAGNOSIS — I209 Angina pectoris, unspecified: Secondary | ICD-10-CM | POA: Diagnosis not present

## 2019-04-11 DIAGNOSIS — M6281 Muscle weakness (generalized): Secondary | ICD-10-CM | POA: Diagnosis not present

## 2019-04-11 DIAGNOSIS — Z741 Need for assistance with personal care: Secondary | ICD-10-CM | POA: Diagnosis not present

## 2019-04-12 DIAGNOSIS — R2689 Other abnormalities of gait and mobility: Secondary | ICD-10-CM | POA: Diagnosis not present

## 2019-04-12 DIAGNOSIS — R1319 Other dysphagia: Secondary | ICD-10-CM | POA: Diagnosis not present

## 2019-04-12 DIAGNOSIS — M6281 Muscle weakness (generalized): Secondary | ICD-10-CM | POA: Diagnosis not present

## 2019-04-12 DIAGNOSIS — I209 Angina pectoris, unspecified: Secondary | ICD-10-CM | POA: Diagnosis not present

## 2019-04-12 DIAGNOSIS — Z741 Need for assistance with personal care: Secondary | ICD-10-CM | POA: Diagnosis not present

## 2019-04-12 DIAGNOSIS — J449 Chronic obstructive pulmonary disease, unspecified: Secondary | ICD-10-CM | POA: Diagnosis not present

## 2019-04-21 DIAGNOSIS — H906 Mixed conductive and sensorineural hearing loss, bilateral: Secondary | ICD-10-CM | POA: Diagnosis not present

## 2019-04-21 DIAGNOSIS — H6123 Impacted cerumen, bilateral: Secondary | ICD-10-CM | POA: Diagnosis not present

## 2019-04-21 DIAGNOSIS — H903 Sensorineural hearing loss, bilateral: Secondary | ICD-10-CM | POA: Diagnosis not present

## 2019-06-02 ENCOUNTER — Other Ambulatory Visit: Payer: Self-pay

## 2019-06-06 DIAGNOSIS — M79675 Pain in left toe(s): Secondary | ICD-10-CM | POA: Diagnosis not present

## 2019-06-06 DIAGNOSIS — B351 Tinea unguium: Secondary | ICD-10-CM | POA: Diagnosis not present

## 2019-06-06 DIAGNOSIS — M79674 Pain in right toe(s): Secondary | ICD-10-CM | POA: Diagnosis not present

## 2019-06-07 DIAGNOSIS — Z741 Need for assistance with personal care: Secondary | ICD-10-CM | POA: Diagnosis not present

## 2019-06-07 DIAGNOSIS — K210 Gastro-esophageal reflux disease with esophagitis: Secondary | ICD-10-CM | POA: Diagnosis not present

## 2019-06-07 DIAGNOSIS — I639 Cerebral infarction, unspecified: Secondary | ICD-10-CM | POA: Diagnosis not present

## 2019-06-07 DIAGNOSIS — R278 Other lack of coordination: Secondary | ICD-10-CM | POA: Diagnosis not present

## 2019-06-07 DIAGNOSIS — R41841 Cognitive communication deficit: Secondary | ICD-10-CM | POA: Diagnosis not present

## 2019-06-07 DIAGNOSIS — R1310 Dysphagia, unspecified: Secondary | ICD-10-CM | POA: Diagnosis not present

## 2019-06-07 DIAGNOSIS — R1319 Other dysphagia: Secondary | ICD-10-CM | POA: Diagnosis not present

## 2019-06-07 DIAGNOSIS — R2689 Other abnormalities of gait and mobility: Secondary | ICD-10-CM | POA: Diagnosis not present

## 2019-06-07 DIAGNOSIS — M810 Age-related osteoporosis without current pathological fracture: Secondary | ICD-10-CM | POA: Diagnosis not present

## 2019-06-07 DIAGNOSIS — M6281 Muscle weakness (generalized): Secondary | ICD-10-CM | POA: Diagnosis not present

## 2019-06-07 DIAGNOSIS — I209 Angina pectoris, unspecified: Secondary | ICD-10-CM | POA: Diagnosis not present

## 2019-06-07 DIAGNOSIS — J449 Chronic obstructive pulmonary disease, unspecified: Secondary | ICD-10-CM | POA: Diagnosis not present

## 2019-06-08 DIAGNOSIS — R2689 Other abnormalities of gait and mobility: Secondary | ICD-10-CM | POA: Diagnosis not present

## 2019-06-08 DIAGNOSIS — M6281 Muscle weakness (generalized): Secondary | ICD-10-CM | POA: Diagnosis not present

## 2019-06-08 DIAGNOSIS — J449 Chronic obstructive pulmonary disease, unspecified: Secondary | ICD-10-CM | POA: Diagnosis not present

## 2019-06-08 DIAGNOSIS — I209 Angina pectoris, unspecified: Secondary | ICD-10-CM | POA: Diagnosis not present

## 2019-06-08 DIAGNOSIS — R1319 Other dysphagia: Secondary | ICD-10-CM | POA: Diagnosis not present

## 2019-06-08 DIAGNOSIS — Z741 Need for assistance with personal care: Secondary | ICD-10-CM | POA: Diagnosis not present

## 2019-06-09 DIAGNOSIS — J449 Chronic obstructive pulmonary disease, unspecified: Secondary | ICD-10-CM | POA: Diagnosis not present

## 2019-06-09 DIAGNOSIS — R1319 Other dysphagia: Secondary | ICD-10-CM | POA: Diagnosis not present

## 2019-06-09 DIAGNOSIS — R2689 Other abnormalities of gait and mobility: Secondary | ICD-10-CM | POA: Diagnosis not present

## 2019-06-09 DIAGNOSIS — I209 Angina pectoris, unspecified: Secondary | ICD-10-CM | POA: Diagnosis not present

## 2019-06-09 DIAGNOSIS — Z741 Need for assistance with personal care: Secondary | ICD-10-CM | POA: Diagnosis not present

## 2019-06-09 DIAGNOSIS — M6281 Muscle weakness (generalized): Secondary | ICD-10-CM | POA: Diagnosis not present

## 2019-06-10 DIAGNOSIS — R1319 Other dysphagia: Secondary | ICD-10-CM | POA: Diagnosis not present

## 2019-06-10 DIAGNOSIS — Z741 Need for assistance with personal care: Secondary | ICD-10-CM | POA: Diagnosis not present

## 2019-06-10 DIAGNOSIS — M6281 Muscle weakness (generalized): Secondary | ICD-10-CM | POA: Diagnosis not present

## 2019-06-10 DIAGNOSIS — I209 Angina pectoris, unspecified: Secondary | ICD-10-CM | POA: Diagnosis not present

## 2019-06-10 DIAGNOSIS — J449 Chronic obstructive pulmonary disease, unspecified: Secondary | ICD-10-CM | POA: Diagnosis not present

## 2019-06-10 DIAGNOSIS — R2689 Other abnormalities of gait and mobility: Secondary | ICD-10-CM | POA: Diagnosis not present

## 2019-06-13 DIAGNOSIS — I209 Angina pectoris, unspecified: Secondary | ICD-10-CM | POA: Diagnosis not present

## 2019-06-13 DIAGNOSIS — M6281 Muscle weakness (generalized): Secondary | ICD-10-CM | POA: Diagnosis not present

## 2019-06-13 DIAGNOSIS — R1319 Other dysphagia: Secondary | ICD-10-CM | POA: Diagnosis not present

## 2019-06-13 DIAGNOSIS — R2689 Other abnormalities of gait and mobility: Secondary | ICD-10-CM | POA: Diagnosis not present

## 2019-06-13 DIAGNOSIS — Z741 Need for assistance with personal care: Secondary | ICD-10-CM | POA: Diagnosis not present

## 2019-06-13 DIAGNOSIS — J449 Chronic obstructive pulmonary disease, unspecified: Secondary | ICD-10-CM | POA: Diagnosis not present

## 2019-06-14 DIAGNOSIS — J449 Chronic obstructive pulmonary disease, unspecified: Secondary | ICD-10-CM | POA: Diagnosis not present

## 2019-06-14 DIAGNOSIS — M6281 Muscle weakness (generalized): Secondary | ICD-10-CM | POA: Diagnosis not present

## 2019-06-14 DIAGNOSIS — R1319 Other dysphagia: Secondary | ICD-10-CM | POA: Diagnosis not present

## 2019-06-14 DIAGNOSIS — I209 Angina pectoris, unspecified: Secondary | ICD-10-CM | POA: Diagnosis not present

## 2019-06-14 DIAGNOSIS — Z741 Need for assistance with personal care: Secondary | ICD-10-CM | POA: Diagnosis not present

## 2019-06-14 DIAGNOSIS — R2689 Other abnormalities of gait and mobility: Secondary | ICD-10-CM | POA: Diagnosis not present

## 2019-06-15 DIAGNOSIS — I209 Angina pectoris, unspecified: Secondary | ICD-10-CM | POA: Diagnosis not present

## 2019-06-15 DIAGNOSIS — R2689 Other abnormalities of gait and mobility: Secondary | ICD-10-CM | POA: Diagnosis not present

## 2019-06-15 DIAGNOSIS — M6281 Muscle weakness (generalized): Secondary | ICD-10-CM | POA: Diagnosis not present

## 2019-06-15 DIAGNOSIS — R1319 Other dysphagia: Secondary | ICD-10-CM | POA: Diagnosis not present

## 2019-06-15 DIAGNOSIS — Z741 Need for assistance with personal care: Secondary | ICD-10-CM | POA: Diagnosis not present

## 2019-06-15 DIAGNOSIS — J449 Chronic obstructive pulmonary disease, unspecified: Secondary | ICD-10-CM | POA: Diagnosis not present

## 2019-06-16 DIAGNOSIS — M6281 Muscle weakness (generalized): Secondary | ICD-10-CM | POA: Diagnosis not present

## 2019-06-16 DIAGNOSIS — J449 Chronic obstructive pulmonary disease, unspecified: Secondary | ICD-10-CM | POA: Diagnosis not present

## 2019-06-16 DIAGNOSIS — Z741 Need for assistance with personal care: Secondary | ICD-10-CM | POA: Diagnosis not present

## 2019-06-16 DIAGNOSIS — R2689 Other abnormalities of gait and mobility: Secondary | ICD-10-CM | POA: Diagnosis not present

## 2019-06-16 DIAGNOSIS — I209 Angina pectoris, unspecified: Secondary | ICD-10-CM | POA: Diagnosis not present

## 2019-06-16 DIAGNOSIS — R1319 Other dysphagia: Secondary | ICD-10-CM | POA: Diagnosis not present

## 2019-06-17 DIAGNOSIS — R2689 Other abnormalities of gait and mobility: Secondary | ICD-10-CM | POA: Diagnosis not present

## 2019-06-17 DIAGNOSIS — I209 Angina pectoris, unspecified: Secondary | ICD-10-CM | POA: Diagnosis not present

## 2019-06-17 DIAGNOSIS — F418 Other specified anxiety disorders: Secondary | ICD-10-CM | POA: Diagnosis not present

## 2019-06-17 DIAGNOSIS — J449 Chronic obstructive pulmonary disease, unspecified: Secondary | ICD-10-CM | POA: Diagnosis not present

## 2019-06-17 DIAGNOSIS — Z741 Need for assistance with personal care: Secondary | ICD-10-CM | POA: Diagnosis not present

## 2019-06-17 DIAGNOSIS — M6281 Muscle weakness (generalized): Secondary | ICD-10-CM | POA: Diagnosis not present

## 2019-06-17 DIAGNOSIS — I639 Cerebral infarction, unspecified: Secondary | ICD-10-CM | POA: Diagnosis not present

## 2019-06-17 DIAGNOSIS — R1319 Other dysphagia: Secondary | ICD-10-CM | POA: Diagnosis not present

## 2019-06-17 DIAGNOSIS — K219 Gastro-esophageal reflux disease without esophagitis: Secondary | ICD-10-CM | POA: Diagnosis not present

## 2019-06-20 DIAGNOSIS — J449 Chronic obstructive pulmonary disease, unspecified: Secondary | ICD-10-CM | POA: Diagnosis not present

## 2019-06-20 DIAGNOSIS — M6281 Muscle weakness (generalized): Secondary | ICD-10-CM | POA: Diagnosis not present

## 2019-06-20 DIAGNOSIS — R2689 Other abnormalities of gait and mobility: Secondary | ICD-10-CM | POA: Diagnosis not present

## 2019-06-20 DIAGNOSIS — Z741 Need for assistance with personal care: Secondary | ICD-10-CM | POA: Diagnosis not present

## 2019-06-20 DIAGNOSIS — I209 Angina pectoris, unspecified: Secondary | ICD-10-CM | POA: Diagnosis not present

## 2019-06-20 DIAGNOSIS — R1319 Other dysphagia: Secondary | ICD-10-CM | POA: Diagnosis not present

## 2019-06-21 DIAGNOSIS — J449 Chronic obstructive pulmonary disease, unspecified: Secondary | ICD-10-CM | POA: Diagnosis not present

## 2019-06-21 DIAGNOSIS — R2689 Other abnormalities of gait and mobility: Secondary | ICD-10-CM | POA: Diagnosis not present

## 2019-06-21 DIAGNOSIS — R1319 Other dysphagia: Secondary | ICD-10-CM | POA: Diagnosis not present

## 2019-06-21 DIAGNOSIS — Z741 Need for assistance with personal care: Secondary | ICD-10-CM | POA: Diagnosis not present

## 2019-06-21 DIAGNOSIS — M6281 Muscle weakness (generalized): Secondary | ICD-10-CM | POA: Diagnosis not present

## 2019-06-21 DIAGNOSIS — I209 Angina pectoris, unspecified: Secondary | ICD-10-CM | POA: Diagnosis not present

## 2019-06-22 DIAGNOSIS — R2689 Other abnormalities of gait and mobility: Secondary | ICD-10-CM | POA: Diagnosis not present

## 2019-06-22 DIAGNOSIS — R1319 Other dysphagia: Secondary | ICD-10-CM | POA: Diagnosis not present

## 2019-06-22 DIAGNOSIS — Z741 Need for assistance with personal care: Secondary | ICD-10-CM | POA: Diagnosis not present

## 2019-06-22 DIAGNOSIS — J449 Chronic obstructive pulmonary disease, unspecified: Secondary | ICD-10-CM | POA: Diagnosis not present

## 2019-06-22 DIAGNOSIS — M6281 Muscle weakness (generalized): Secondary | ICD-10-CM | POA: Diagnosis not present

## 2019-06-22 DIAGNOSIS — I209 Angina pectoris, unspecified: Secondary | ICD-10-CM | POA: Diagnosis not present

## 2019-06-23 DIAGNOSIS — I209 Angina pectoris, unspecified: Secondary | ICD-10-CM | POA: Diagnosis not present

## 2019-06-23 DIAGNOSIS — R2689 Other abnormalities of gait and mobility: Secondary | ICD-10-CM | POA: Diagnosis not present

## 2019-06-23 DIAGNOSIS — R1319 Other dysphagia: Secondary | ICD-10-CM | POA: Diagnosis not present

## 2019-06-23 DIAGNOSIS — Z741 Need for assistance with personal care: Secondary | ICD-10-CM | POA: Diagnosis not present

## 2019-06-23 DIAGNOSIS — M6281 Muscle weakness (generalized): Secondary | ICD-10-CM | POA: Diagnosis not present

## 2019-06-23 DIAGNOSIS — J449 Chronic obstructive pulmonary disease, unspecified: Secondary | ICD-10-CM | POA: Diagnosis not present

## 2019-06-24 DIAGNOSIS — M6281 Muscle weakness (generalized): Secondary | ICD-10-CM | POA: Diagnosis not present

## 2019-06-24 DIAGNOSIS — J449 Chronic obstructive pulmonary disease, unspecified: Secondary | ICD-10-CM | POA: Diagnosis not present

## 2019-06-24 DIAGNOSIS — R1319 Other dysphagia: Secondary | ICD-10-CM | POA: Diagnosis not present

## 2019-06-24 DIAGNOSIS — R2689 Other abnormalities of gait and mobility: Secondary | ICD-10-CM | POA: Diagnosis not present

## 2019-06-24 DIAGNOSIS — I209 Angina pectoris, unspecified: Secondary | ICD-10-CM | POA: Diagnosis not present

## 2019-06-24 DIAGNOSIS — Z741 Need for assistance with personal care: Secondary | ICD-10-CM | POA: Diagnosis not present

## 2019-06-27 DIAGNOSIS — J449 Chronic obstructive pulmonary disease, unspecified: Secondary | ICD-10-CM | POA: Diagnosis not present

## 2019-06-27 DIAGNOSIS — Z741 Need for assistance with personal care: Secondary | ICD-10-CM | POA: Diagnosis not present

## 2019-06-27 DIAGNOSIS — M6281 Muscle weakness (generalized): Secondary | ICD-10-CM | POA: Diagnosis not present

## 2019-06-27 DIAGNOSIS — R2689 Other abnormalities of gait and mobility: Secondary | ICD-10-CM | POA: Diagnosis not present

## 2019-06-27 DIAGNOSIS — I209 Angina pectoris, unspecified: Secondary | ICD-10-CM | POA: Diagnosis not present

## 2019-06-27 DIAGNOSIS — R1319 Other dysphagia: Secondary | ICD-10-CM | POA: Diagnosis not present

## 2019-06-28 DIAGNOSIS — I209 Angina pectoris, unspecified: Secondary | ICD-10-CM | POA: Diagnosis not present

## 2019-06-28 DIAGNOSIS — M6281 Muscle weakness (generalized): Secondary | ICD-10-CM | POA: Diagnosis not present

## 2019-06-28 DIAGNOSIS — J449 Chronic obstructive pulmonary disease, unspecified: Secondary | ICD-10-CM | POA: Diagnosis not present

## 2019-06-28 DIAGNOSIS — Z741 Need for assistance with personal care: Secondary | ICD-10-CM | POA: Diagnosis not present

## 2019-06-28 DIAGNOSIS — R1319 Other dysphagia: Secondary | ICD-10-CM | POA: Diagnosis not present

## 2019-06-28 DIAGNOSIS — R2689 Other abnormalities of gait and mobility: Secondary | ICD-10-CM | POA: Diagnosis not present

## 2019-06-29 DIAGNOSIS — R2689 Other abnormalities of gait and mobility: Secondary | ICD-10-CM | POA: Diagnosis not present

## 2019-06-29 DIAGNOSIS — R1319 Other dysphagia: Secondary | ICD-10-CM | POA: Diagnosis not present

## 2019-06-29 DIAGNOSIS — M6281 Muscle weakness (generalized): Secondary | ICD-10-CM | POA: Diagnosis not present

## 2019-06-29 DIAGNOSIS — J449 Chronic obstructive pulmonary disease, unspecified: Secondary | ICD-10-CM | POA: Diagnosis not present

## 2019-06-29 DIAGNOSIS — Z741 Need for assistance with personal care: Secondary | ICD-10-CM | POA: Diagnosis not present

## 2019-06-29 DIAGNOSIS — I209 Angina pectoris, unspecified: Secondary | ICD-10-CM | POA: Diagnosis not present

## 2019-06-30 DIAGNOSIS — R2689 Other abnormalities of gait and mobility: Secondary | ICD-10-CM | POA: Diagnosis not present

## 2019-06-30 DIAGNOSIS — R1319 Other dysphagia: Secondary | ICD-10-CM | POA: Diagnosis not present

## 2019-06-30 DIAGNOSIS — I209 Angina pectoris, unspecified: Secondary | ICD-10-CM | POA: Diagnosis not present

## 2019-06-30 DIAGNOSIS — Z741 Need for assistance with personal care: Secondary | ICD-10-CM | POA: Diagnosis not present

## 2019-06-30 DIAGNOSIS — J449 Chronic obstructive pulmonary disease, unspecified: Secondary | ICD-10-CM | POA: Diagnosis not present

## 2019-06-30 DIAGNOSIS — M6281 Muscle weakness (generalized): Secondary | ICD-10-CM | POA: Diagnosis not present

## 2019-07-01 DIAGNOSIS — Z741 Need for assistance with personal care: Secondary | ICD-10-CM | POA: Diagnosis not present

## 2019-07-01 DIAGNOSIS — M6281 Muscle weakness (generalized): Secondary | ICD-10-CM | POA: Diagnosis not present

## 2019-07-01 DIAGNOSIS — R1319 Other dysphagia: Secondary | ICD-10-CM | POA: Diagnosis not present

## 2019-07-01 DIAGNOSIS — R2689 Other abnormalities of gait and mobility: Secondary | ICD-10-CM | POA: Diagnosis not present

## 2019-07-01 DIAGNOSIS — J449 Chronic obstructive pulmonary disease, unspecified: Secondary | ICD-10-CM | POA: Diagnosis not present

## 2019-07-01 DIAGNOSIS — I209 Angina pectoris, unspecified: Secondary | ICD-10-CM | POA: Diagnosis not present

## 2019-07-04 DIAGNOSIS — R1319 Other dysphagia: Secondary | ICD-10-CM | POA: Diagnosis not present

## 2019-07-04 DIAGNOSIS — J449 Chronic obstructive pulmonary disease, unspecified: Secondary | ICD-10-CM | POA: Diagnosis not present

## 2019-07-04 DIAGNOSIS — M6281 Muscle weakness (generalized): Secondary | ICD-10-CM | POA: Diagnosis not present

## 2019-07-04 DIAGNOSIS — Z741 Need for assistance with personal care: Secondary | ICD-10-CM | POA: Diagnosis not present

## 2019-07-04 DIAGNOSIS — I209 Angina pectoris, unspecified: Secondary | ICD-10-CM | POA: Diagnosis not present

## 2019-07-04 DIAGNOSIS — R2689 Other abnormalities of gait and mobility: Secondary | ICD-10-CM | POA: Diagnosis not present

## 2019-07-05 DIAGNOSIS — R278 Other lack of coordination: Secondary | ICD-10-CM | POA: Diagnosis not present

## 2019-07-05 DIAGNOSIS — I209 Angina pectoris, unspecified: Secondary | ICD-10-CM | POA: Diagnosis not present

## 2019-07-05 DIAGNOSIS — R41841 Cognitive communication deficit: Secondary | ICD-10-CM | POA: Diagnosis not present

## 2019-07-05 DIAGNOSIS — R2689 Other abnormalities of gait and mobility: Secondary | ICD-10-CM | POA: Diagnosis not present

## 2019-07-05 DIAGNOSIS — K210 Gastro-esophageal reflux disease with esophagitis: Secondary | ICD-10-CM | POA: Diagnosis not present

## 2019-07-05 DIAGNOSIS — J449 Chronic obstructive pulmonary disease, unspecified: Secondary | ICD-10-CM | POA: Diagnosis not present

## 2019-07-05 DIAGNOSIS — Z741 Need for assistance with personal care: Secondary | ICD-10-CM | POA: Diagnosis not present

## 2019-07-05 DIAGNOSIS — M810 Age-related osteoporosis without current pathological fracture: Secondary | ICD-10-CM | POA: Diagnosis not present

## 2019-07-05 DIAGNOSIS — M6281 Muscle weakness (generalized): Secondary | ICD-10-CM | POA: Diagnosis not present

## 2019-07-05 DIAGNOSIS — R1310 Dysphagia, unspecified: Secondary | ICD-10-CM | POA: Diagnosis not present

## 2019-07-05 DIAGNOSIS — I639 Cerebral infarction, unspecified: Secondary | ICD-10-CM | POA: Diagnosis not present

## 2019-07-05 DIAGNOSIS — R1319 Other dysphagia: Secondary | ICD-10-CM | POA: Diagnosis not present

## 2019-07-06 DIAGNOSIS — I209 Angina pectoris, unspecified: Secondary | ICD-10-CM | POA: Diagnosis not present

## 2019-07-06 DIAGNOSIS — Z741 Need for assistance with personal care: Secondary | ICD-10-CM | POA: Diagnosis not present

## 2019-07-06 DIAGNOSIS — M6281 Muscle weakness (generalized): Secondary | ICD-10-CM | POA: Diagnosis not present

## 2019-07-06 DIAGNOSIS — R2689 Other abnormalities of gait and mobility: Secondary | ICD-10-CM | POA: Diagnosis not present

## 2019-07-06 DIAGNOSIS — J449 Chronic obstructive pulmonary disease, unspecified: Secondary | ICD-10-CM | POA: Diagnosis not present

## 2019-07-06 DIAGNOSIS — R1319 Other dysphagia: Secondary | ICD-10-CM | POA: Diagnosis not present

## 2019-07-07 DIAGNOSIS — R2689 Other abnormalities of gait and mobility: Secondary | ICD-10-CM | POA: Diagnosis not present

## 2019-07-07 DIAGNOSIS — R1319 Other dysphagia: Secondary | ICD-10-CM | POA: Diagnosis not present

## 2019-07-07 DIAGNOSIS — I209 Angina pectoris, unspecified: Secondary | ICD-10-CM | POA: Diagnosis not present

## 2019-07-07 DIAGNOSIS — M6281 Muscle weakness (generalized): Secondary | ICD-10-CM | POA: Diagnosis not present

## 2019-07-07 DIAGNOSIS — Z741 Need for assistance with personal care: Secondary | ICD-10-CM | POA: Diagnosis not present

## 2019-07-07 DIAGNOSIS — J449 Chronic obstructive pulmonary disease, unspecified: Secondary | ICD-10-CM | POA: Diagnosis not present

## 2019-07-08 DIAGNOSIS — R2689 Other abnormalities of gait and mobility: Secondary | ICD-10-CM | POA: Diagnosis not present

## 2019-07-08 DIAGNOSIS — I209 Angina pectoris, unspecified: Secondary | ICD-10-CM | POA: Diagnosis not present

## 2019-07-08 DIAGNOSIS — M6281 Muscle weakness (generalized): Secondary | ICD-10-CM | POA: Diagnosis not present

## 2019-07-08 DIAGNOSIS — R1319 Other dysphagia: Secondary | ICD-10-CM | POA: Diagnosis not present

## 2019-07-08 DIAGNOSIS — Z741 Need for assistance with personal care: Secondary | ICD-10-CM | POA: Diagnosis not present

## 2019-07-08 DIAGNOSIS — J449 Chronic obstructive pulmonary disease, unspecified: Secondary | ICD-10-CM | POA: Diagnosis not present

## 2019-07-11 DIAGNOSIS — Z741 Need for assistance with personal care: Secondary | ICD-10-CM | POA: Diagnosis not present

## 2019-07-11 DIAGNOSIS — M6281 Muscle weakness (generalized): Secondary | ICD-10-CM | POA: Diagnosis not present

## 2019-07-11 DIAGNOSIS — R2689 Other abnormalities of gait and mobility: Secondary | ICD-10-CM | POA: Diagnosis not present

## 2019-07-11 DIAGNOSIS — J449 Chronic obstructive pulmonary disease, unspecified: Secondary | ICD-10-CM | POA: Diagnosis not present

## 2019-07-11 DIAGNOSIS — I209 Angina pectoris, unspecified: Secondary | ICD-10-CM | POA: Diagnosis not present

## 2019-07-11 DIAGNOSIS — R1319 Other dysphagia: Secondary | ICD-10-CM | POA: Diagnosis not present

## 2019-07-12 DIAGNOSIS — M6281 Muscle weakness (generalized): Secondary | ICD-10-CM | POA: Diagnosis not present

## 2019-07-12 DIAGNOSIS — J449 Chronic obstructive pulmonary disease, unspecified: Secondary | ICD-10-CM | POA: Diagnosis not present

## 2019-07-12 DIAGNOSIS — I209 Angina pectoris, unspecified: Secondary | ICD-10-CM | POA: Diagnosis not present

## 2019-07-12 DIAGNOSIS — Z741 Need for assistance with personal care: Secondary | ICD-10-CM | POA: Diagnosis not present

## 2019-07-12 DIAGNOSIS — R2689 Other abnormalities of gait and mobility: Secondary | ICD-10-CM | POA: Diagnosis not present

## 2019-07-12 DIAGNOSIS — R1319 Other dysphagia: Secondary | ICD-10-CM | POA: Diagnosis not present

## 2019-07-13 DIAGNOSIS — R2689 Other abnormalities of gait and mobility: Secondary | ICD-10-CM | POA: Diagnosis not present

## 2019-07-13 DIAGNOSIS — M6281 Muscle weakness (generalized): Secondary | ICD-10-CM | POA: Diagnosis not present

## 2019-07-13 DIAGNOSIS — I209 Angina pectoris, unspecified: Secondary | ICD-10-CM | POA: Diagnosis not present

## 2019-07-13 DIAGNOSIS — R1319 Other dysphagia: Secondary | ICD-10-CM | POA: Diagnosis not present

## 2019-07-13 DIAGNOSIS — Z741 Need for assistance with personal care: Secondary | ICD-10-CM | POA: Diagnosis not present

## 2019-07-13 DIAGNOSIS — J449 Chronic obstructive pulmonary disease, unspecified: Secondary | ICD-10-CM | POA: Diagnosis not present

## 2019-07-14 DIAGNOSIS — I209 Angina pectoris, unspecified: Secondary | ICD-10-CM | POA: Diagnosis not present

## 2019-07-14 DIAGNOSIS — Z0189 Encounter for other specified special examinations: Secondary | ICD-10-CM | POA: Diagnosis not present

## 2019-07-14 DIAGNOSIS — J449 Chronic obstructive pulmonary disease, unspecified: Secondary | ICD-10-CM | POA: Diagnosis not present

## 2019-07-14 DIAGNOSIS — Z741 Need for assistance with personal care: Secondary | ICD-10-CM | POA: Diagnosis not present

## 2019-07-14 DIAGNOSIS — R2689 Other abnormalities of gait and mobility: Secondary | ICD-10-CM | POA: Diagnosis not present

## 2019-07-14 DIAGNOSIS — R1319 Other dysphagia: Secondary | ICD-10-CM | POA: Diagnosis not present

## 2019-07-14 DIAGNOSIS — M6281 Muscle weakness (generalized): Secondary | ICD-10-CM | POA: Diagnosis not present

## 2019-07-15 DIAGNOSIS — R2689 Other abnormalities of gait and mobility: Secondary | ICD-10-CM | POA: Diagnosis not present

## 2019-07-15 DIAGNOSIS — J449 Chronic obstructive pulmonary disease, unspecified: Secondary | ICD-10-CM | POA: Diagnosis not present

## 2019-07-15 DIAGNOSIS — M6281 Muscle weakness (generalized): Secondary | ICD-10-CM | POA: Diagnosis not present

## 2019-07-15 DIAGNOSIS — I209 Angina pectoris, unspecified: Secondary | ICD-10-CM | POA: Diagnosis not present

## 2019-07-15 DIAGNOSIS — R1319 Other dysphagia: Secondary | ICD-10-CM | POA: Diagnosis not present

## 2019-07-15 DIAGNOSIS — Z741 Need for assistance with personal care: Secondary | ICD-10-CM | POA: Diagnosis not present

## 2019-07-16 DIAGNOSIS — R1319 Other dysphagia: Secondary | ICD-10-CM | POA: Diagnosis not present

## 2019-07-16 DIAGNOSIS — J449 Chronic obstructive pulmonary disease, unspecified: Secondary | ICD-10-CM | POA: Diagnosis not present

## 2019-07-16 DIAGNOSIS — I209 Angina pectoris, unspecified: Secondary | ICD-10-CM | POA: Diagnosis not present

## 2019-07-16 DIAGNOSIS — R2689 Other abnormalities of gait and mobility: Secondary | ICD-10-CM | POA: Diagnosis not present

## 2019-07-16 DIAGNOSIS — Z741 Need for assistance with personal care: Secondary | ICD-10-CM | POA: Diagnosis not present

## 2019-07-16 DIAGNOSIS — M6281 Muscle weakness (generalized): Secondary | ICD-10-CM | POA: Diagnosis not present

## 2019-07-28 DIAGNOSIS — Z20828 Contact with and (suspected) exposure to other viral communicable diseases: Secondary | ICD-10-CM | POA: Diagnosis not present

## 2019-08-10 DIAGNOSIS — N400 Enlarged prostate without lower urinary tract symptoms: Secondary | ICD-10-CM | POA: Diagnosis not present

## 2019-08-10 DIAGNOSIS — E782 Mixed hyperlipidemia: Secondary | ICD-10-CM | POA: Diagnosis not present

## 2019-08-10 DIAGNOSIS — K219 Gastro-esophageal reflux disease without esophagitis: Secondary | ICD-10-CM | POA: Diagnosis not present

## 2019-08-10 DIAGNOSIS — F418 Other specified anxiety disorders: Secondary | ICD-10-CM | POA: Diagnosis not present

## 2019-08-16 DIAGNOSIS — Z23 Encounter for immunization: Secondary | ICD-10-CM | POA: Diagnosis not present

## 2019-09-01 DIAGNOSIS — M6281 Muscle weakness (generalized): Secondary | ICD-10-CM | POA: Diagnosis not present

## 2019-09-01 DIAGNOSIS — R41841 Cognitive communication deficit: Secondary | ICD-10-CM | POA: Diagnosis not present

## 2019-09-01 DIAGNOSIS — J449 Chronic obstructive pulmonary disease, unspecified: Secondary | ICD-10-CM | POA: Diagnosis not present

## 2019-09-01 DIAGNOSIS — M810 Age-related osteoporosis without current pathological fracture: Secondary | ICD-10-CM | POA: Diagnosis not present

## 2019-09-01 DIAGNOSIS — Z741 Need for assistance with personal care: Secondary | ICD-10-CM | POA: Diagnosis not present

## 2019-09-01 DIAGNOSIS — R2689 Other abnormalities of gait and mobility: Secondary | ICD-10-CM | POA: Diagnosis not present

## 2019-09-01 DIAGNOSIS — R1310 Dysphagia, unspecified: Secondary | ICD-10-CM | POA: Diagnosis not present

## 2019-09-01 DIAGNOSIS — I639 Cerebral infarction, unspecified: Secondary | ICD-10-CM | POA: Diagnosis not present

## 2019-09-01 DIAGNOSIS — R278 Other lack of coordination: Secondary | ICD-10-CM | POA: Diagnosis not present

## 2019-09-01 DIAGNOSIS — I209 Angina pectoris, unspecified: Secondary | ICD-10-CM | POA: Diagnosis not present

## 2019-09-01 DIAGNOSIS — R1319 Other dysphagia: Secondary | ICD-10-CM | POA: Diagnosis not present

## 2019-09-02 DIAGNOSIS — Z741 Need for assistance with personal care: Secondary | ICD-10-CM | POA: Diagnosis not present

## 2019-09-02 DIAGNOSIS — J449 Chronic obstructive pulmonary disease, unspecified: Secondary | ICD-10-CM | POA: Diagnosis not present

## 2019-09-02 DIAGNOSIS — I209 Angina pectoris, unspecified: Secondary | ICD-10-CM | POA: Diagnosis not present

## 2019-09-02 DIAGNOSIS — R1319 Other dysphagia: Secondary | ICD-10-CM | POA: Diagnosis not present

## 2019-09-02 DIAGNOSIS — R2689 Other abnormalities of gait and mobility: Secondary | ICD-10-CM | POA: Diagnosis not present

## 2019-09-02 DIAGNOSIS — M6281 Muscle weakness (generalized): Secondary | ICD-10-CM | POA: Diagnosis not present

## 2019-09-03 DIAGNOSIS — M6281 Muscle weakness (generalized): Secondary | ICD-10-CM | POA: Diagnosis not present

## 2019-09-03 DIAGNOSIS — R2689 Other abnormalities of gait and mobility: Secondary | ICD-10-CM | POA: Diagnosis not present

## 2019-09-03 DIAGNOSIS — R1319 Other dysphagia: Secondary | ICD-10-CM | POA: Diagnosis not present

## 2019-09-03 DIAGNOSIS — Z741 Need for assistance with personal care: Secondary | ICD-10-CM | POA: Diagnosis not present

## 2019-09-03 DIAGNOSIS — J449 Chronic obstructive pulmonary disease, unspecified: Secondary | ICD-10-CM | POA: Diagnosis not present

## 2019-09-03 DIAGNOSIS — I209 Angina pectoris, unspecified: Secondary | ICD-10-CM | POA: Diagnosis not present

## 2019-09-05 DIAGNOSIS — R2689 Other abnormalities of gait and mobility: Secondary | ICD-10-CM | POA: Diagnosis not present

## 2019-09-05 DIAGNOSIS — M810 Age-related osteoporosis without current pathological fracture: Secondary | ICD-10-CM | POA: Diagnosis not present

## 2019-09-05 DIAGNOSIS — R1319 Other dysphagia: Secondary | ICD-10-CM | POA: Diagnosis not present

## 2019-09-05 DIAGNOSIS — R278 Other lack of coordination: Secondary | ICD-10-CM | POA: Diagnosis not present

## 2019-09-05 DIAGNOSIS — I209 Angina pectoris, unspecified: Secondary | ICD-10-CM | POA: Diagnosis not present

## 2019-09-05 DIAGNOSIS — Z741 Need for assistance with personal care: Secondary | ICD-10-CM | POA: Diagnosis not present

## 2019-09-05 DIAGNOSIS — M6281 Muscle weakness (generalized): Secondary | ICD-10-CM | POA: Diagnosis not present

## 2019-09-05 DIAGNOSIS — I639 Cerebral infarction, unspecified: Secondary | ICD-10-CM | POA: Diagnosis not present

## 2019-09-05 DIAGNOSIS — R41841 Cognitive communication deficit: Secondary | ICD-10-CM | POA: Diagnosis not present

## 2019-09-05 DIAGNOSIS — J449 Chronic obstructive pulmonary disease, unspecified: Secondary | ICD-10-CM | POA: Diagnosis not present

## 2019-09-06 DIAGNOSIS — I209 Angina pectoris, unspecified: Secondary | ICD-10-CM | POA: Diagnosis not present

## 2019-09-06 DIAGNOSIS — R1319 Other dysphagia: Secondary | ICD-10-CM | POA: Diagnosis not present

## 2019-09-06 DIAGNOSIS — Z741 Need for assistance with personal care: Secondary | ICD-10-CM | POA: Diagnosis not present

## 2019-09-06 DIAGNOSIS — R2689 Other abnormalities of gait and mobility: Secondary | ICD-10-CM | POA: Diagnosis not present

## 2019-09-06 DIAGNOSIS — M6281 Muscle weakness (generalized): Secondary | ICD-10-CM | POA: Diagnosis not present

## 2019-09-06 DIAGNOSIS — J449 Chronic obstructive pulmonary disease, unspecified: Secondary | ICD-10-CM | POA: Diagnosis not present

## 2019-09-08 DIAGNOSIS — I209 Angina pectoris, unspecified: Secondary | ICD-10-CM | POA: Diagnosis not present

## 2019-09-08 DIAGNOSIS — Z741 Need for assistance with personal care: Secondary | ICD-10-CM | POA: Diagnosis not present

## 2019-09-08 DIAGNOSIS — M6281 Muscle weakness (generalized): Secondary | ICD-10-CM | POA: Diagnosis not present

## 2019-09-08 DIAGNOSIS — R2689 Other abnormalities of gait and mobility: Secondary | ICD-10-CM | POA: Diagnosis not present

## 2019-09-08 DIAGNOSIS — R1319 Other dysphagia: Secondary | ICD-10-CM | POA: Diagnosis not present

## 2019-09-08 DIAGNOSIS — J449 Chronic obstructive pulmonary disease, unspecified: Secondary | ICD-10-CM | POA: Diagnosis not present

## 2019-09-09 DIAGNOSIS — J449 Chronic obstructive pulmonary disease, unspecified: Secondary | ICD-10-CM | POA: Diagnosis not present

## 2019-09-09 DIAGNOSIS — R1319 Other dysphagia: Secondary | ICD-10-CM | POA: Diagnosis not present

## 2019-09-09 DIAGNOSIS — R2689 Other abnormalities of gait and mobility: Secondary | ICD-10-CM | POA: Diagnosis not present

## 2019-09-09 DIAGNOSIS — M6281 Muscle weakness (generalized): Secondary | ICD-10-CM | POA: Diagnosis not present

## 2019-09-09 DIAGNOSIS — I209 Angina pectoris, unspecified: Secondary | ICD-10-CM | POA: Diagnosis not present

## 2019-09-09 DIAGNOSIS — Z741 Need for assistance with personal care: Secondary | ICD-10-CM | POA: Diagnosis not present

## 2019-09-11 DIAGNOSIS — I209 Angina pectoris, unspecified: Secondary | ICD-10-CM | POA: Diagnosis not present

## 2019-09-11 DIAGNOSIS — Z741 Need for assistance with personal care: Secondary | ICD-10-CM | POA: Diagnosis not present

## 2019-09-11 DIAGNOSIS — R2689 Other abnormalities of gait and mobility: Secondary | ICD-10-CM | POA: Diagnosis not present

## 2019-09-11 DIAGNOSIS — R1319 Other dysphagia: Secondary | ICD-10-CM | POA: Diagnosis not present

## 2019-09-11 DIAGNOSIS — M6281 Muscle weakness (generalized): Secondary | ICD-10-CM | POA: Diagnosis not present

## 2019-09-11 DIAGNOSIS — J449 Chronic obstructive pulmonary disease, unspecified: Secondary | ICD-10-CM | POA: Diagnosis not present

## 2019-09-11 IMAGING — DX DG CHEST 2V
2 series · 2 of 2 positions shown · non-contrast
Comparison: 02/19/2018.

CLINICAL DATA: Cough and chest congestion for the past 3-4 days.

EXAM:
CHEST - 2 VIEW

[chest lat]
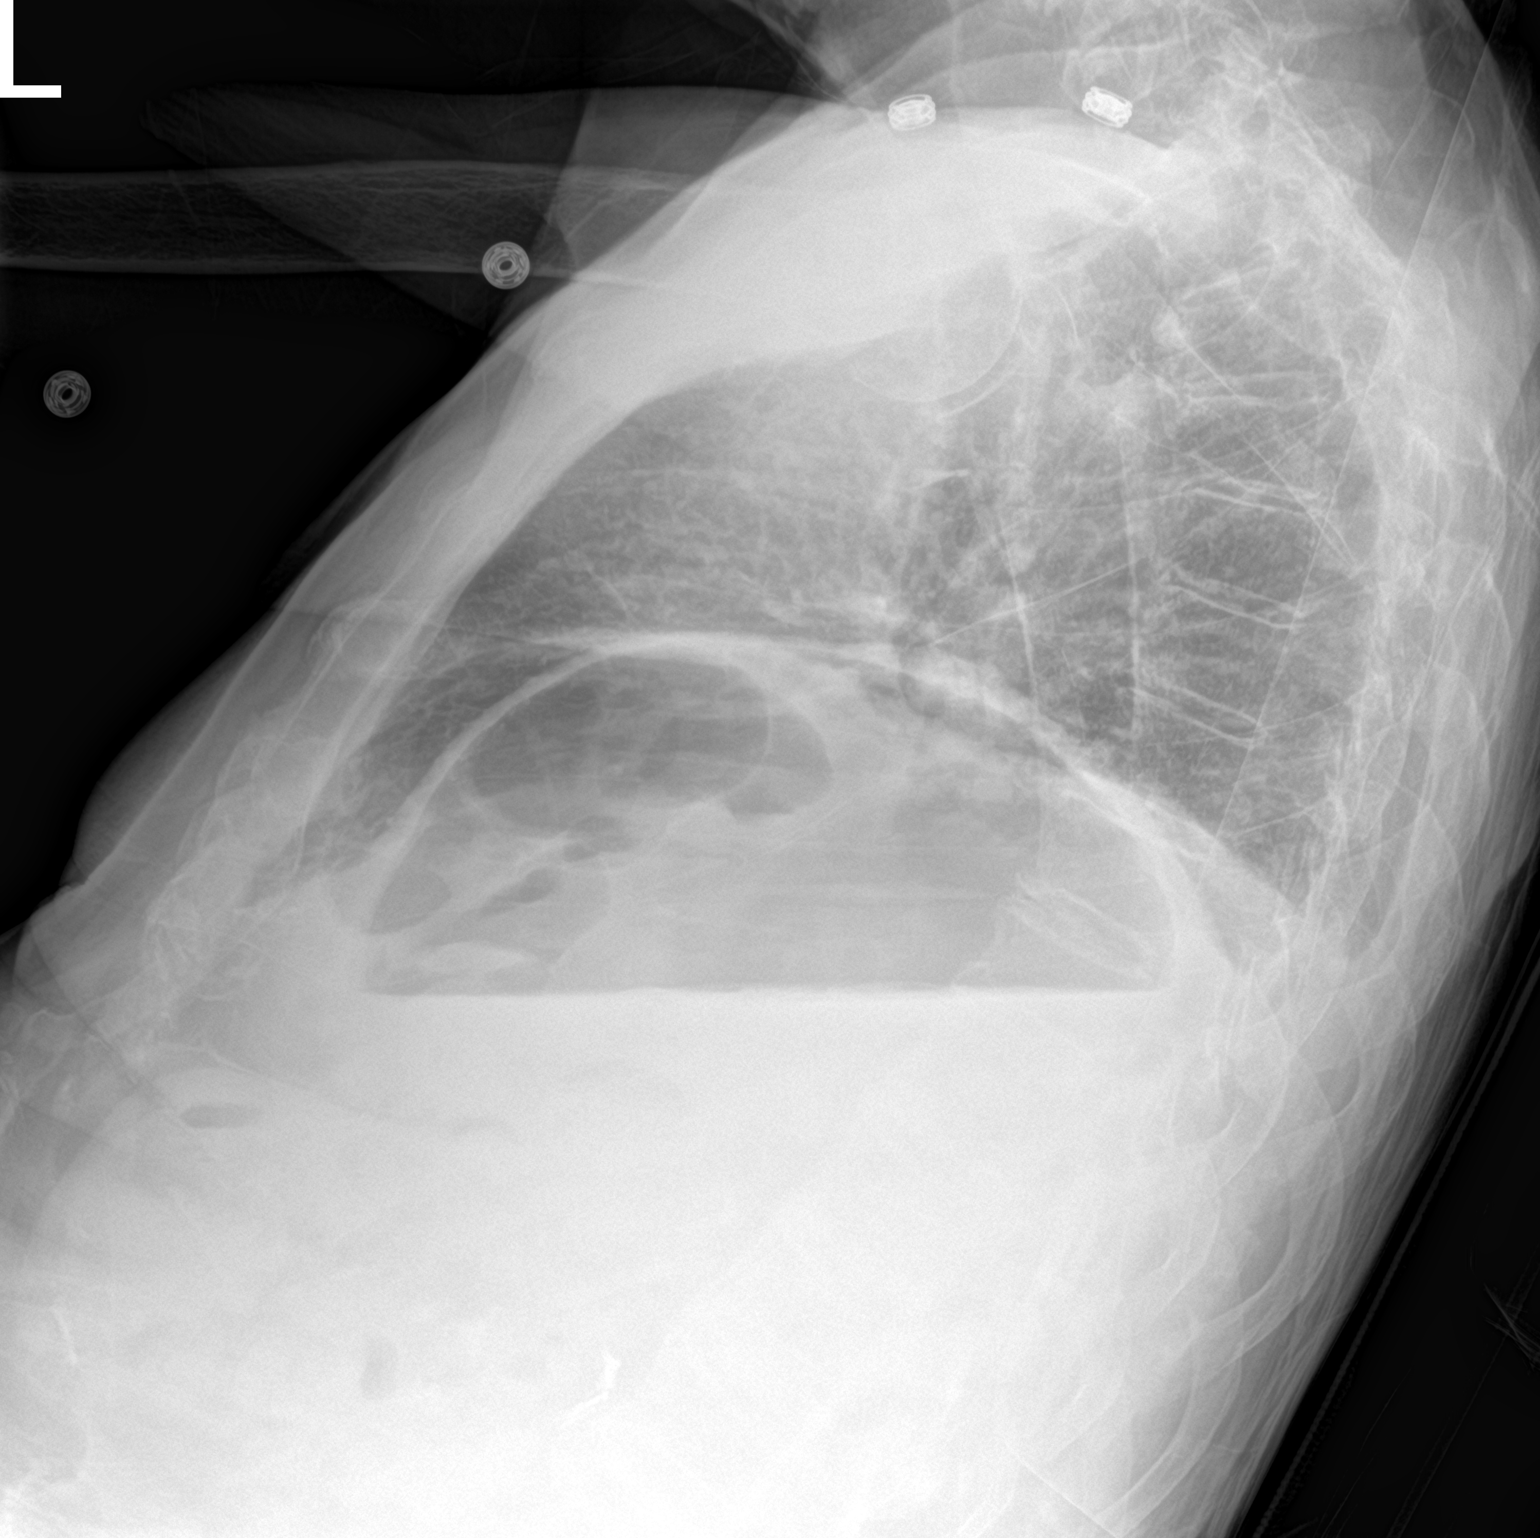

[chest ap]
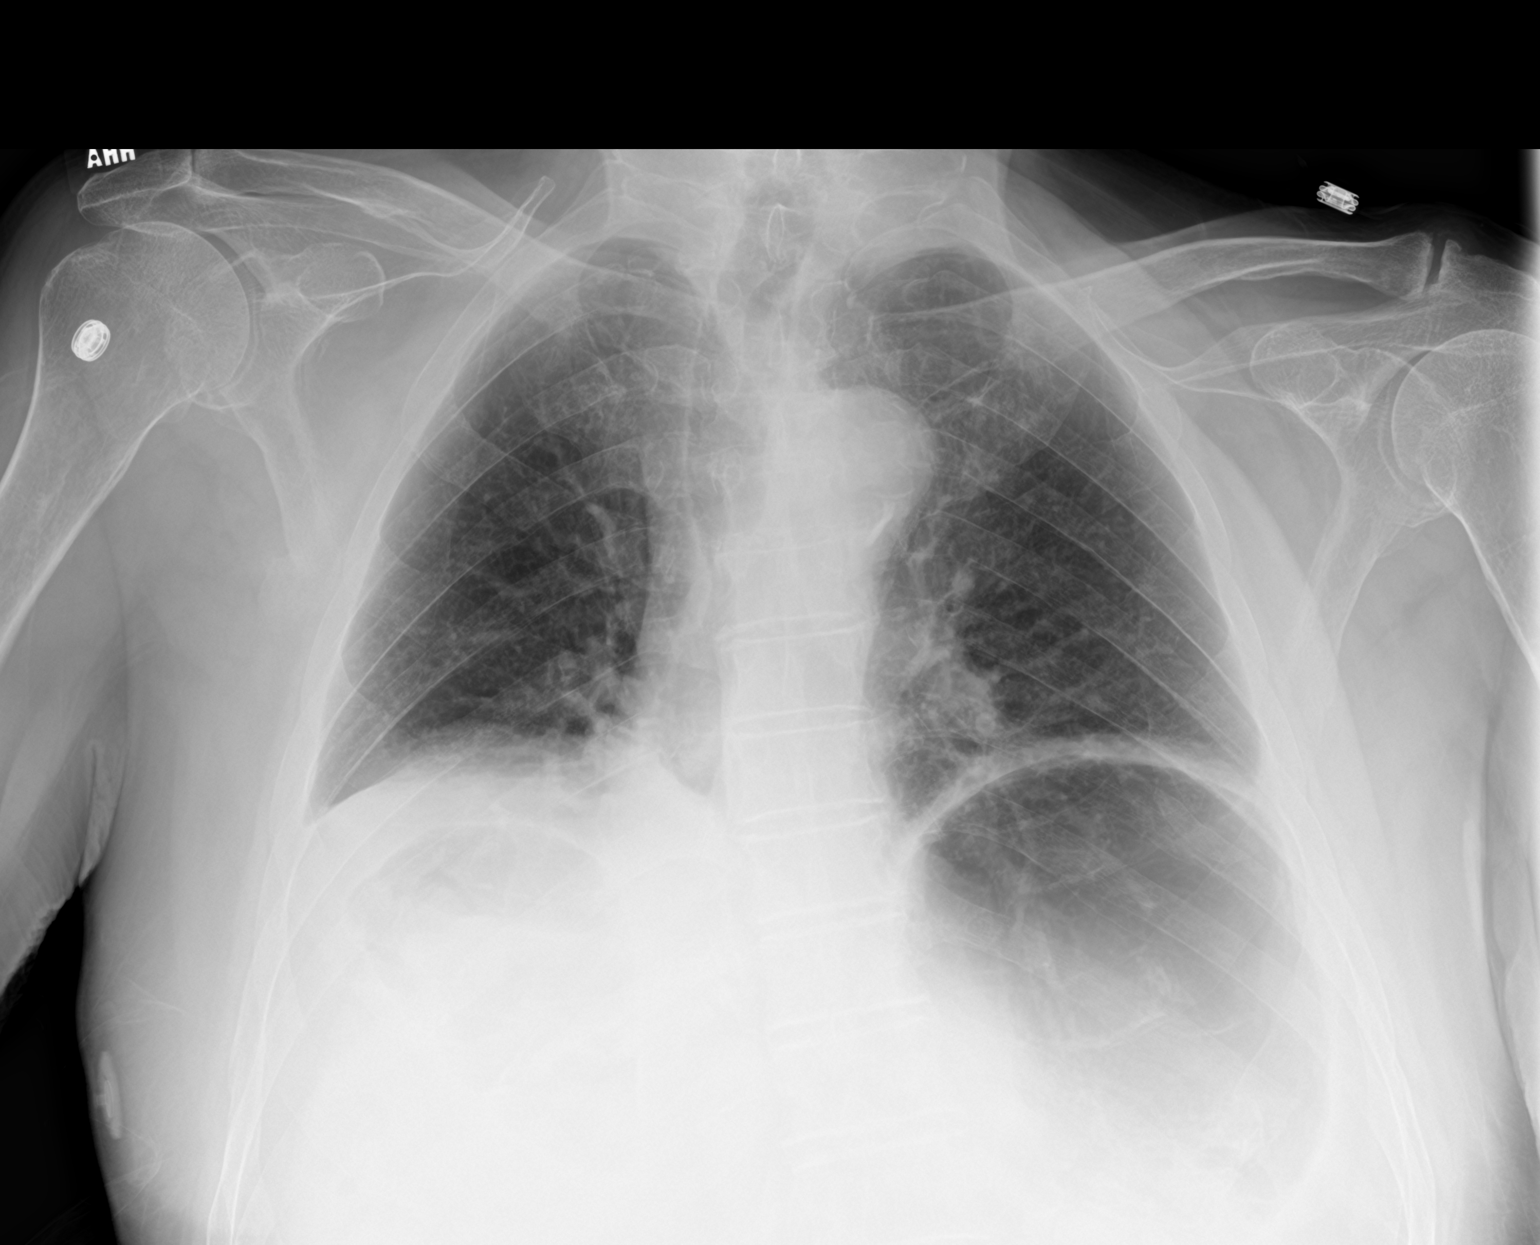

[2 of 2 positions shown; findings below may reference images not displayed]

FINDINGS: Again demonstrated is a poor inspiration. Mild bibasilar
atelectasis. Normal sized heart. Tortuous and calcified thoracic
aorta. Mild diffuse peribronchial thickening. Mild thoracic spine
degenerative changes. Diffuse osteopenia. Cholecystectomy clips. Gas
and fluid distended stomach.
IMPRESSION: 1. Poor inspiration with mild bibasilar atelectasis.
2. Mild bronchitic changes.
3. Gastric distention.

## 2019-09-12 DIAGNOSIS — R1319 Other dysphagia: Secondary | ICD-10-CM | POA: Diagnosis not present

## 2019-09-12 DIAGNOSIS — Z741 Need for assistance with personal care: Secondary | ICD-10-CM | POA: Diagnosis not present

## 2019-09-12 DIAGNOSIS — R2689 Other abnormalities of gait and mobility: Secondary | ICD-10-CM | POA: Diagnosis not present

## 2019-09-12 DIAGNOSIS — I209 Angina pectoris, unspecified: Secondary | ICD-10-CM | POA: Diagnosis not present

## 2019-09-12 DIAGNOSIS — M6281 Muscle weakness (generalized): Secondary | ICD-10-CM | POA: Diagnosis not present

## 2019-09-12 DIAGNOSIS — J449 Chronic obstructive pulmonary disease, unspecified: Secondary | ICD-10-CM | POA: Diagnosis not present

## 2019-09-13 DIAGNOSIS — Z741 Need for assistance with personal care: Secondary | ICD-10-CM | POA: Diagnosis not present

## 2019-09-13 DIAGNOSIS — R1319 Other dysphagia: Secondary | ICD-10-CM | POA: Diagnosis not present

## 2019-09-13 DIAGNOSIS — M6281 Muscle weakness (generalized): Secondary | ICD-10-CM | POA: Diagnosis not present

## 2019-09-13 DIAGNOSIS — I209 Angina pectoris, unspecified: Secondary | ICD-10-CM | POA: Diagnosis not present

## 2019-09-13 DIAGNOSIS — R2689 Other abnormalities of gait and mobility: Secondary | ICD-10-CM | POA: Diagnosis not present

## 2019-09-13 DIAGNOSIS — J449 Chronic obstructive pulmonary disease, unspecified: Secondary | ICD-10-CM | POA: Diagnosis not present

## 2019-09-15 DIAGNOSIS — J449 Chronic obstructive pulmonary disease, unspecified: Secondary | ICD-10-CM | POA: Diagnosis not present

## 2019-09-15 DIAGNOSIS — R2689 Other abnormalities of gait and mobility: Secondary | ICD-10-CM | POA: Diagnosis not present

## 2019-09-15 DIAGNOSIS — Z741 Need for assistance with personal care: Secondary | ICD-10-CM | POA: Diagnosis not present

## 2019-09-15 DIAGNOSIS — I209 Angina pectoris, unspecified: Secondary | ICD-10-CM | POA: Diagnosis not present

## 2019-09-15 DIAGNOSIS — R1319 Other dysphagia: Secondary | ICD-10-CM | POA: Diagnosis not present

## 2019-09-15 DIAGNOSIS — M6281 Muscle weakness (generalized): Secondary | ICD-10-CM | POA: Diagnosis not present

## 2019-09-16 DIAGNOSIS — J449 Chronic obstructive pulmonary disease, unspecified: Secondary | ICD-10-CM | POA: Diagnosis not present

## 2019-09-16 DIAGNOSIS — Z741 Need for assistance with personal care: Secondary | ICD-10-CM | POA: Diagnosis not present

## 2019-09-16 DIAGNOSIS — M6281 Muscle weakness (generalized): Secondary | ICD-10-CM | POA: Diagnosis not present

## 2019-09-16 DIAGNOSIS — R2689 Other abnormalities of gait and mobility: Secondary | ICD-10-CM | POA: Diagnosis not present

## 2019-09-16 DIAGNOSIS — I209 Angina pectoris, unspecified: Secondary | ICD-10-CM | POA: Diagnosis not present

## 2019-09-16 DIAGNOSIS — R1319 Other dysphagia: Secondary | ICD-10-CM | POA: Diagnosis not present

## 2019-09-19 DIAGNOSIS — M6281 Muscle weakness (generalized): Secondary | ICD-10-CM | POA: Diagnosis not present

## 2019-09-19 DIAGNOSIS — I209 Angina pectoris, unspecified: Secondary | ICD-10-CM | POA: Diagnosis not present

## 2019-09-19 DIAGNOSIS — R2689 Other abnormalities of gait and mobility: Secondary | ICD-10-CM | POA: Diagnosis not present

## 2019-09-19 DIAGNOSIS — Z741 Need for assistance with personal care: Secondary | ICD-10-CM | POA: Diagnosis not present

## 2019-09-19 DIAGNOSIS — J449 Chronic obstructive pulmonary disease, unspecified: Secondary | ICD-10-CM | POA: Diagnosis not present

## 2019-09-19 DIAGNOSIS — R1319 Other dysphagia: Secondary | ICD-10-CM | POA: Diagnosis not present

## 2019-09-20 DIAGNOSIS — R2689 Other abnormalities of gait and mobility: Secondary | ICD-10-CM | POA: Diagnosis not present

## 2019-09-20 DIAGNOSIS — Z79899 Other long term (current) drug therapy: Secondary | ICD-10-CM | POA: Diagnosis not present

## 2019-09-20 DIAGNOSIS — E782 Mixed hyperlipidemia: Secondary | ICD-10-CM | POA: Diagnosis not present

## 2019-09-20 DIAGNOSIS — I209 Angina pectoris, unspecified: Secondary | ICD-10-CM | POA: Diagnosis not present

## 2019-09-20 DIAGNOSIS — J449 Chronic obstructive pulmonary disease, unspecified: Secondary | ICD-10-CM | POA: Diagnosis not present

## 2019-09-20 DIAGNOSIS — Z741 Need for assistance with personal care: Secondary | ICD-10-CM | POA: Diagnosis not present

## 2019-09-20 DIAGNOSIS — E785 Hyperlipidemia, unspecified: Secondary | ICD-10-CM | POA: Diagnosis not present

## 2019-09-20 DIAGNOSIS — M6281 Muscle weakness (generalized): Secondary | ICD-10-CM | POA: Diagnosis not present

## 2019-09-20 DIAGNOSIS — R1319 Other dysphagia: Secondary | ICD-10-CM | POA: Diagnosis not present

## 2019-09-21 DIAGNOSIS — Z741 Need for assistance with personal care: Secondary | ICD-10-CM | POA: Diagnosis not present

## 2019-09-21 DIAGNOSIS — B351 Tinea unguium: Secondary | ICD-10-CM | POA: Diagnosis not present

## 2019-09-21 DIAGNOSIS — J449 Chronic obstructive pulmonary disease, unspecified: Secondary | ICD-10-CM | POA: Diagnosis not present

## 2019-09-21 DIAGNOSIS — R2689 Other abnormalities of gait and mobility: Secondary | ICD-10-CM | POA: Diagnosis not present

## 2019-09-21 DIAGNOSIS — M6281 Muscle weakness (generalized): Secondary | ICD-10-CM | POA: Diagnosis not present

## 2019-09-21 DIAGNOSIS — I209 Angina pectoris, unspecified: Secondary | ICD-10-CM | POA: Diagnosis not present

## 2019-09-21 DIAGNOSIS — R1319 Other dysphagia: Secondary | ICD-10-CM | POA: Diagnosis not present

## 2019-09-21 DIAGNOSIS — M79675 Pain in left toe(s): Secondary | ICD-10-CM | POA: Diagnosis not present

## 2019-09-21 DIAGNOSIS — M79674 Pain in right toe(s): Secondary | ICD-10-CM | POA: Diagnosis not present

## 2019-09-22 DIAGNOSIS — J449 Chronic obstructive pulmonary disease, unspecified: Secondary | ICD-10-CM | POA: Diagnosis not present

## 2019-09-22 DIAGNOSIS — Z741 Need for assistance with personal care: Secondary | ICD-10-CM | POA: Diagnosis not present

## 2019-09-22 DIAGNOSIS — I209 Angina pectoris, unspecified: Secondary | ICD-10-CM | POA: Diagnosis not present

## 2019-09-22 DIAGNOSIS — M6281 Muscle weakness (generalized): Secondary | ICD-10-CM | POA: Diagnosis not present

## 2019-09-22 DIAGNOSIS — R2689 Other abnormalities of gait and mobility: Secondary | ICD-10-CM | POA: Diagnosis not present

## 2019-09-22 DIAGNOSIS — R1319 Other dysphagia: Secondary | ICD-10-CM | POA: Diagnosis not present

## 2019-09-25 DIAGNOSIS — J449 Chronic obstructive pulmonary disease, unspecified: Secondary | ICD-10-CM | POA: Diagnosis not present

## 2019-09-25 DIAGNOSIS — M6281 Muscle weakness (generalized): Secondary | ICD-10-CM | POA: Diagnosis not present

## 2019-09-25 DIAGNOSIS — R1319 Other dysphagia: Secondary | ICD-10-CM | POA: Diagnosis not present

## 2019-09-25 DIAGNOSIS — R2689 Other abnormalities of gait and mobility: Secondary | ICD-10-CM | POA: Diagnosis not present

## 2019-09-25 DIAGNOSIS — I209 Angina pectoris, unspecified: Secondary | ICD-10-CM | POA: Diagnosis not present

## 2019-09-25 DIAGNOSIS — Z741 Need for assistance with personal care: Secondary | ICD-10-CM | POA: Diagnosis not present

## 2019-09-26 DIAGNOSIS — M6281 Muscle weakness (generalized): Secondary | ICD-10-CM | POA: Diagnosis not present

## 2019-09-26 DIAGNOSIS — R1319 Other dysphagia: Secondary | ICD-10-CM | POA: Diagnosis not present

## 2019-09-26 DIAGNOSIS — R2689 Other abnormalities of gait and mobility: Secondary | ICD-10-CM | POA: Diagnosis not present

## 2019-09-26 DIAGNOSIS — I209 Angina pectoris, unspecified: Secondary | ICD-10-CM | POA: Diagnosis not present

## 2019-09-26 DIAGNOSIS — Z741 Need for assistance with personal care: Secondary | ICD-10-CM | POA: Diagnosis not present

## 2019-09-26 DIAGNOSIS — J449 Chronic obstructive pulmonary disease, unspecified: Secondary | ICD-10-CM | POA: Diagnosis not present

## 2019-09-27 DIAGNOSIS — R2689 Other abnormalities of gait and mobility: Secondary | ICD-10-CM | POA: Diagnosis not present

## 2019-09-27 DIAGNOSIS — J449 Chronic obstructive pulmonary disease, unspecified: Secondary | ICD-10-CM | POA: Diagnosis not present

## 2019-09-27 DIAGNOSIS — Z741 Need for assistance with personal care: Secondary | ICD-10-CM | POA: Diagnosis not present

## 2019-09-27 DIAGNOSIS — M6281 Muscle weakness (generalized): Secondary | ICD-10-CM | POA: Diagnosis not present

## 2019-09-27 DIAGNOSIS — R1319 Other dysphagia: Secondary | ICD-10-CM | POA: Diagnosis not present

## 2019-09-27 DIAGNOSIS — I209 Angina pectoris, unspecified: Secondary | ICD-10-CM | POA: Diagnosis not present

## 2019-09-28 DIAGNOSIS — R1319 Other dysphagia: Secondary | ICD-10-CM | POA: Diagnosis not present

## 2019-09-28 DIAGNOSIS — R2689 Other abnormalities of gait and mobility: Secondary | ICD-10-CM | POA: Diagnosis not present

## 2019-09-28 DIAGNOSIS — Z741 Need for assistance with personal care: Secondary | ICD-10-CM | POA: Diagnosis not present

## 2019-09-28 DIAGNOSIS — J449 Chronic obstructive pulmonary disease, unspecified: Secondary | ICD-10-CM | POA: Diagnosis not present

## 2019-09-28 DIAGNOSIS — I209 Angina pectoris, unspecified: Secondary | ICD-10-CM | POA: Diagnosis not present

## 2019-09-28 DIAGNOSIS — M6281 Muscle weakness (generalized): Secondary | ICD-10-CM | POA: Diagnosis not present

## 2019-09-30 DIAGNOSIS — Z741 Need for assistance with personal care: Secondary | ICD-10-CM | POA: Diagnosis not present

## 2019-09-30 DIAGNOSIS — I209 Angina pectoris, unspecified: Secondary | ICD-10-CM | POA: Diagnosis not present

## 2019-09-30 DIAGNOSIS — R1319 Other dysphagia: Secondary | ICD-10-CM | POA: Diagnosis not present

## 2019-09-30 DIAGNOSIS — R2689 Other abnormalities of gait and mobility: Secondary | ICD-10-CM | POA: Diagnosis not present

## 2019-09-30 DIAGNOSIS — J449 Chronic obstructive pulmonary disease, unspecified: Secondary | ICD-10-CM | POA: Diagnosis not present

## 2019-09-30 DIAGNOSIS — M6281 Muscle weakness (generalized): Secondary | ICD-10-CM | POA: Diagnosis not present

## 2019-10-01 IMAGING — CT CT ABD-PELV W/ CM
2 of 6 series · 13 of 36 positions shown, 16 images · IV contrast (ISOVUE)
Comparison: Abdominopelvic CT 02/19/2018. Chest radiograph earlier
today.

CLINICAL DATA: Difficulty swallowing. Nausea and vomiting.
Hypertension. Emphysema.

EXAM:
CT CHEST, ABDOMEN, AND PELVIS WITH CONTRAST
TECHNIQUE: Multidetector CT imaging of the chest, abdomen and pelvis was
performed following the standard protocol during bolus
administration of intravenous contrast.
CONTRAST:  100mL QPLQLV-DSS IOPAMIDOL (QPLQLV-DSS) INJECTION 61%

[Series 4: thins · axial · 0.76mm/px · z∈[+1030,+1597]mm · 10 of 900 slices shown, 13 images]
[im 45/900  mediastinal]
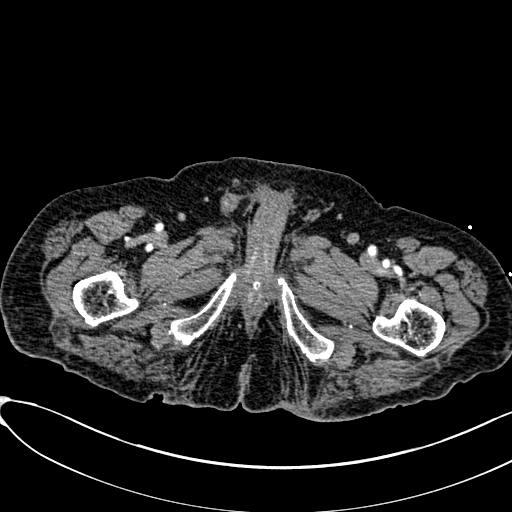
[im 45/900  lung]
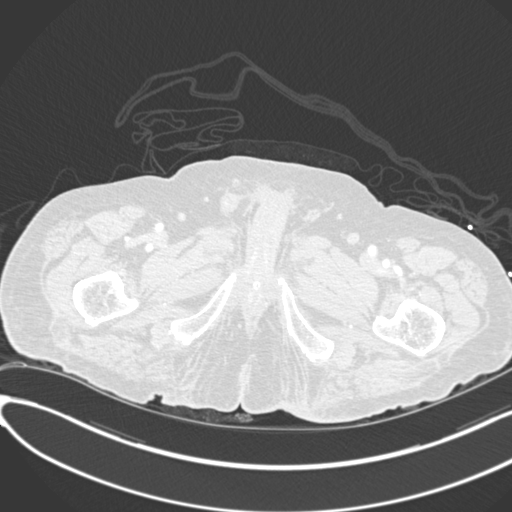
[im 135/900  lung]
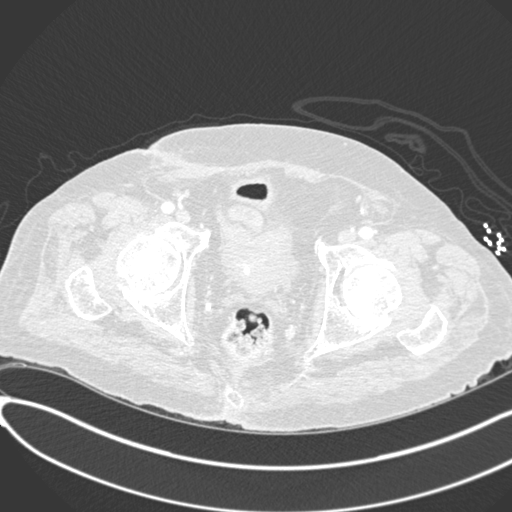
[im 225/900  lung]
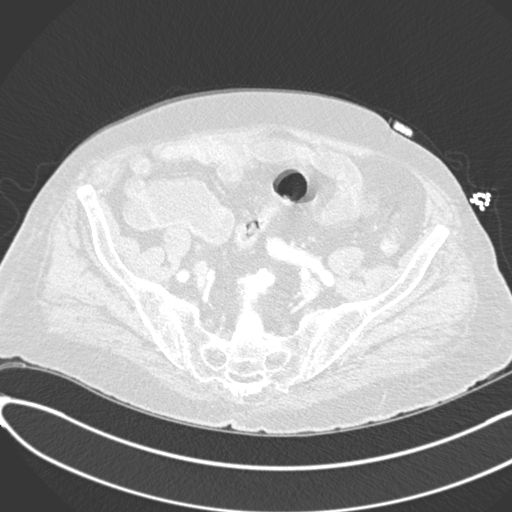
[im 315/900  lung]
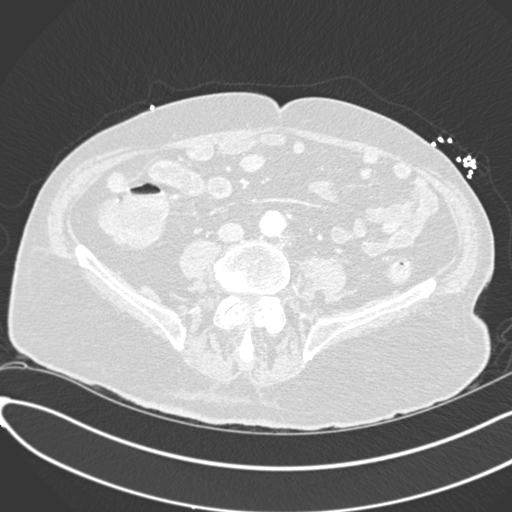
[im 405/900  mediastinal]
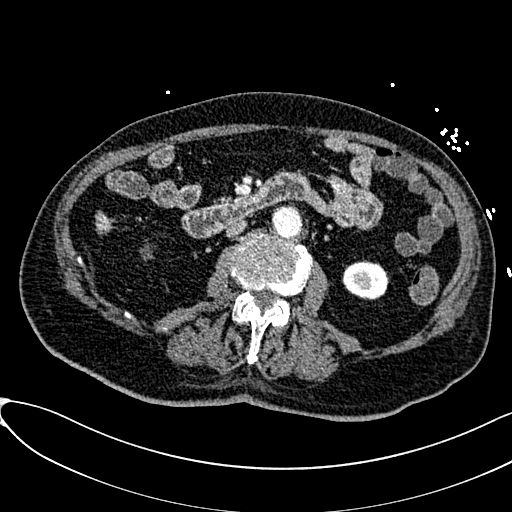
[im 405/900  lung]
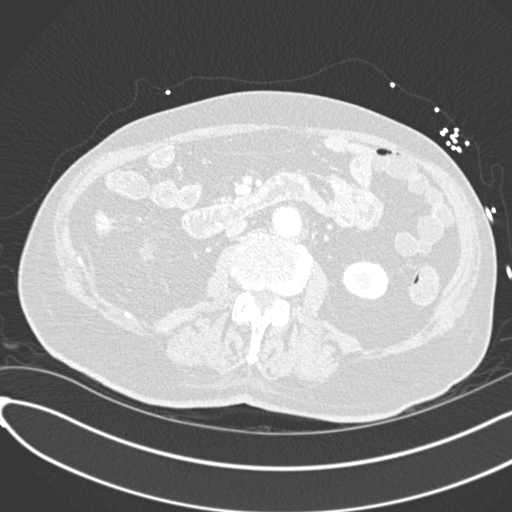
[im 495/900  lung]
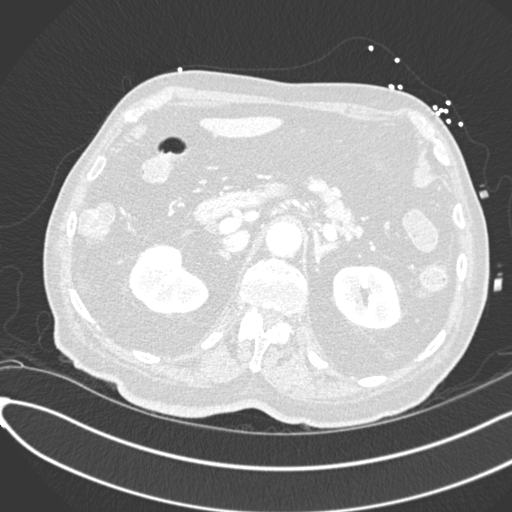
[im 585/900  lung]
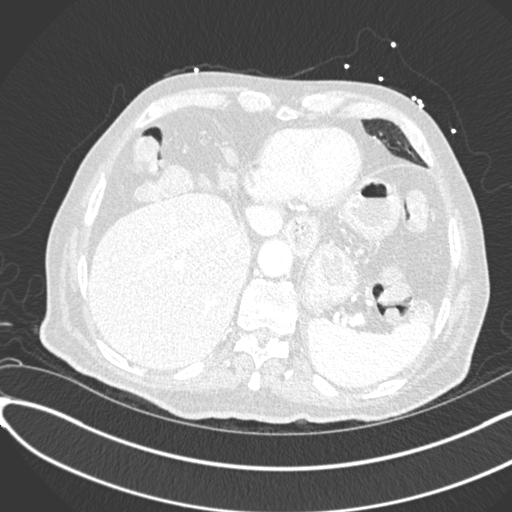
[im 675/900  lung]
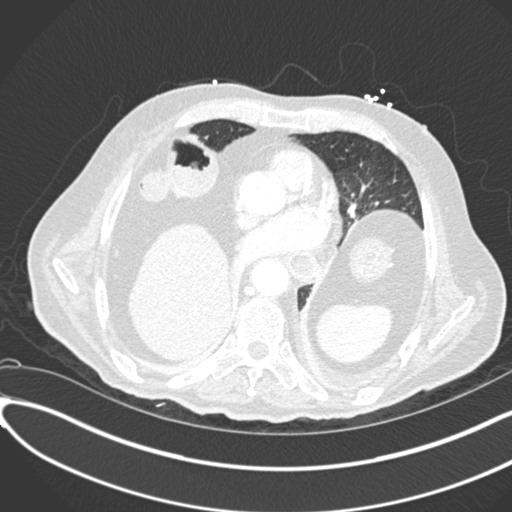
[im 765/900  mediastinal]
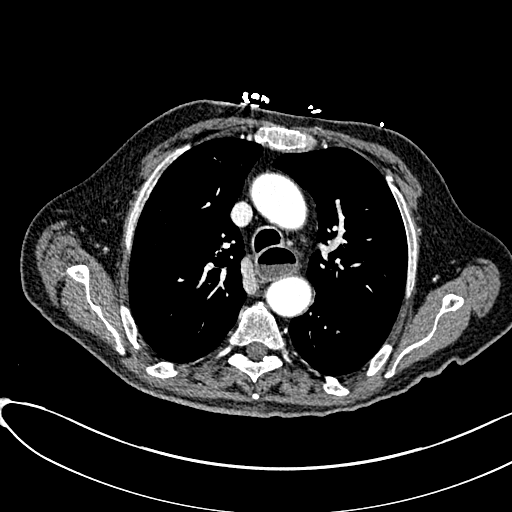
[im 765/900  lung]
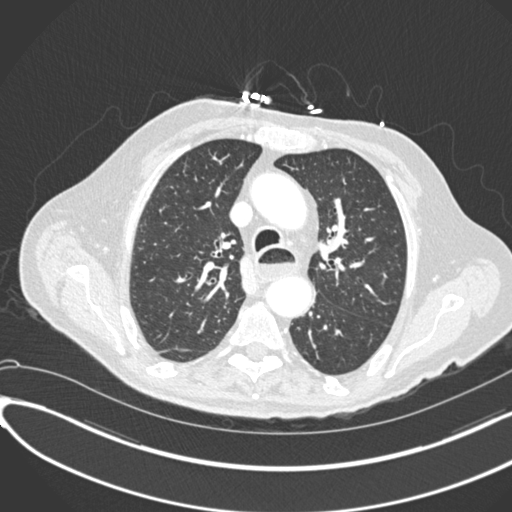
[im 855/900  lung]
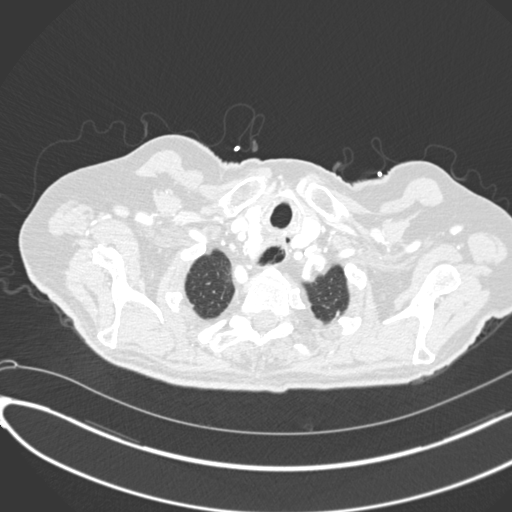

[Series 6: coronals · coronal · 0.79mm/px · 3 of 140 slices shown]
[im 28/140  lung]
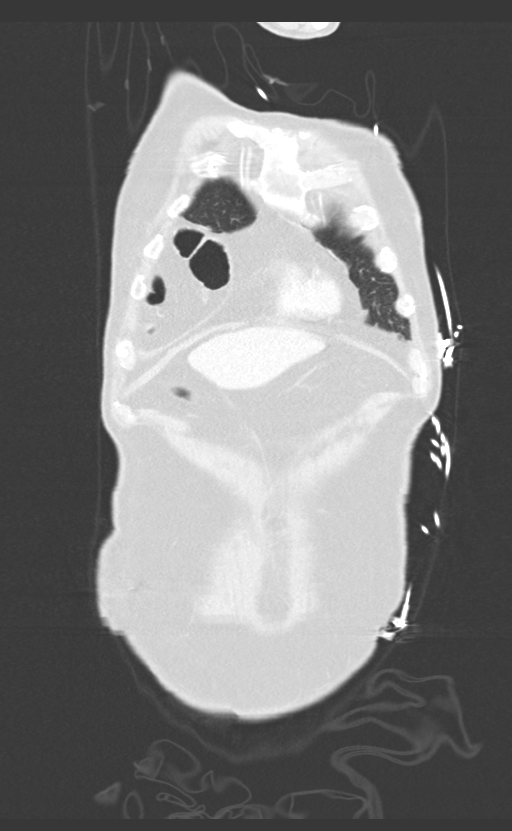
[im 56/140  lung]
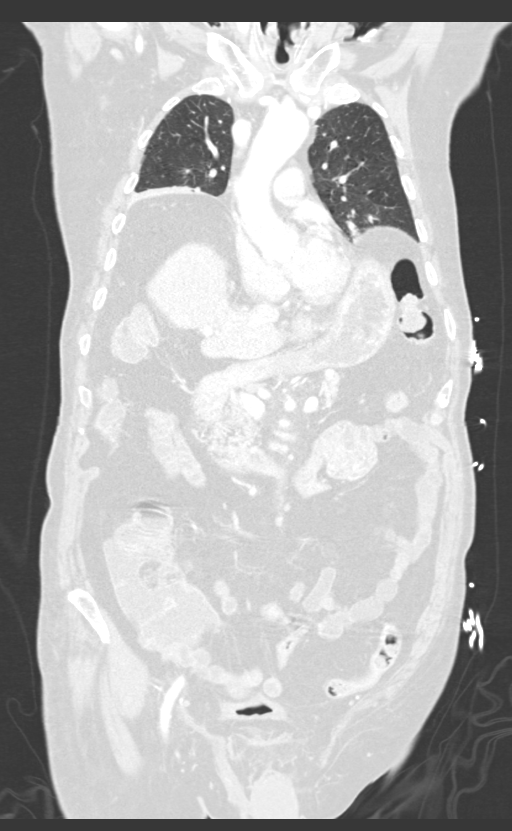
[im 84/140  lung]
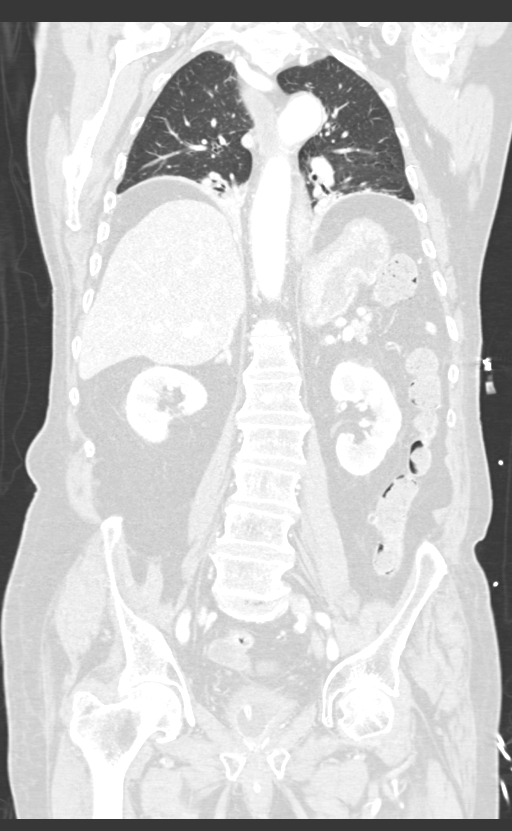

[13 of 36 positions shown; findings below may reference images not displayed]

FINDINGS: CT CHEST FINDINGS

Cardiovascular: Aortic and branch vessel atherosclerosis. Aberrant
right subclavian artery traversing posterior to the esophagus.
Normal heart size, without pericardial effusion. Multivessel
coronary artery atherosclerosis.

Mediastinum/Nodes: Right-sided thyroid enlargement with a 1.2 cm
nodule within, nonspecific. No mediastinal or hilar adenopathy. The
esophagus is dilated and fluid-filled, including on image [DATE]. No
well-defined obstructive mass.

Lungs/Pleura: Small left-sided pleural effusion. Bibasilar volume
loss with diaphragmatic elevation and overlying subsegmental
atelectasis in both lung bases.

Musculoskeletal: No acute osseous abnormality.

CT ABDOMEN PELVIS FINDINGS

Hepatobiliary: Normal liver. Cholecystectomy, without biliary ductal
dilatation.

Pancreas: Normal, without mass or ductal dilatation.

Spleen: Normal in size, without focal abnormality.

Adrenals/Urinary Tract: Mild left adrenal nodularity. Normal right
adrenal gland. Normal kidneys, without hydronephrosis. Foley
catheter within the urinary bladder. The bladder is not entirely
decompressed. Mildly thick walled bladder.

Stomach/Bowel: Normal stomach, without wall thickening. Scattered
colonic diverticula. Normal terminal ileum. Normal small bowel.

Vascular/Lymphatic: Aortic and branch vessel atherosclerosis. No
abdominopelvic adenopathy.

Reproductive: Normal prostate.

Other: No significant free fluid. Decreased anterior pelvic wall
edema. No free intraperitoneal air.

Musculoskeletal: Osteopenia. Presumably degenerative partial fusion
of the bilateral sacroiliac joints. Lumbosacral spondylosis.
IMPRESSION: 1. Fluid-filled, dilated esophagus. This could represent esophageal
dysmotility or an otherwise occult obstructive lesion. Consider
nonemergent esophagram.
2. No other acute process in the chest abdomen or pelvis.
3. Foley catheter within the urinary bladder. Wall thickening within
the bladder may represent cystitis. Improved presumed cellulitis
about the ventral pelvic wall. Lack of entire bladder decompression
may suggest Foley catheter dysfunction.
4. Coronary artery atherosclerosis. Aortic Atherosclerosis
([id number]).
5. Low lung volumes with diaphragmatic elevation bilaterally.

## 2019-10-04 DIAGNOSIS — U071 COVID-19: Secondary | ICD-10-CM

## 2019-10-04 HISTORY — DX: COVID-19: U07.1

## 2019-10-07 DIAGNOSIS — N400 Enlarged prostate without lower urinary tract symptoms: Secondary | ICD-10-CM | POA: Diagnosis not present

## 2019-10-07 DIAGNOSIS — F418 Other specified anxiety disorders: Secondary | ICD-10-CM | POA: Diagnosis not present

## 2019-10-07 DIAGNOSIS — M810 Age-related osteoporosis without current pathological fracture: Secondary | ICD-10-CM | POA: Diagnosis not present

## 2019-10-07 DIAGNOSIS — K5909 Other constipation: Secondary | ICD-10-CM | POA: Diagnosis not present

## 2019-10-07 DIAGNOSIS — R1319 Other dysphagia: Secondary | ICD-10-CM | POA: Diagnosis not present

## 2019-10-07 DIAGNOSIS — I6389 Other cerebral infarction: Secondary | ICD-10-CM | POA: Diagnosis not present

## 2019-10-07 DIAGNOSIS — D6489 Other specified anemias: Secondary | ICD-10-CM | POA: Diagnosis not present

## 2019-10-07 DIAGNOSIS — K219 Gastro-esophageal reflux disease without esophagitis: Secondary | ICD-10-CM | POA: Diagnosis not present

## 2019-10-07 DIAGNOSIS — J984 Other disorders of lung: Secondary | ICD-10-CM | POA: Diagnosis not present

## 2019-10-07 DIAGNOSIS — I9589 Other hypotension: Secondary | ICD-10-CM | POA: Diagnosis not present

## 2019-10-07 DIAGNOSIS — F320 Major depressive disorder, single episode, mild: Secondary | ICD-10-CM | POA: Diagnosis not present

## 2019-10-07 DIAGNOSIS — N3281 Overactive bladder: Secondary | ICD-10-CM | POA: Diagnosis not present

## 2019-10-24 ENCOUNTER — Emergency Department (HOSPITAL_COMMUNITY): Payer: Medicare Other

## 2019-10-24 ENCOUNTER — Inpatient Hospital Stay (HOSPITAL_COMMUNITY)
Admission: EM | Admit: 2019-10-24 | Discharge: 2019-10-28 | DRG: 177 | Disposition: A | Discharge status: Skilled Nursing Facility | Payer: Medicare Other | Attending: Internal Medicine | Admitting: Internal Medicine

## 2019-10-24 ENCOUNTER — Other Ambulatory Visit: Payer: Self-pay

## 2019-10-24 ENCOUNTER — Encounter (HOSPITAL_COMMUNITY): Payer: Self-pay

## 2019-10-24 DIAGNOSIS — D351 Benign neoplasm of parathyroid gland: Secondary | ICD-10-CM | POA: Diagnosis present

## 2019-10-24 DIAGNOSIS — J9601 Acute respiratory failure with hypoxia: Secondary | ICD-10-CM | POA: Diagnosis present

## 2019-10-24 DIAGNOSIS — Z91040 Latex allergy status: Secondary | ICD-10-CM

## 2019-10-24 DIAGNOSIS — K2270 Barrett's esophagus without dysplasia: Secondary | ICD-10-CM | POA: Diagnosis present

## 2019-10-24 DIAGNOSIS — H409 Unspecified glaucoma: Secondary | ICD-10-CM | POA: Diagnosis present

## 2019-10-24 DIAGNOSIS — Z66 Do not resuscitate: Secondary | ICD-10-CM | POA: Diagnosis present

## 2019-10-24 DIAGNOSIS — R600 Localized edema: Secondary | ICD-10-CM | POA: Diagnosis present

## 2019-10-24 DIAGNOSIS — E785 Hyperlipidemia, unspecified: Secondary | ICD-10-CM | POA: Diagnosis not present

## 2019-10-24 DIAGNOSIS — Z8546 Personal history of malignant neoplasm of prostate: Secondary | ICD-10-CM | POA: Diagnosis not present

## 2019-10-24 DIAGNOSIS — I779 Disorder of arteries and arterioles, unspecified: Secondary | ICD-10-CM | POA: Diagnosis not present

## 2019-10-24 DIAGNOSIS — E873 Alkalosis: Secondary | ICD-10-CM | POA: Diagnosis present

## 2019-10-24 DIAGNOSIS — Z800 Family history of malignant neoplasm of digestive organs: Secondary | ICD-10-CM

## 2019-10-24 DIAGNOSIS — E042 Nontoxic multinodular goiter: Secondary | ICD-10-CM | POA: Diagnosis present

## 2019-10-24 DIAGNOSIS — Z87891 Personal history of nicotine dependence: Secondary | ICD-10-CM

## 2019-10-24 DIAGNOSIS — I82401 Acute embolism and thrombosis of unspecified deep veins of right lower extremity: Secondary | ICD-10-CM | POA: Diagnosis not present

## 2019-10-24 DIAGNOSIS — F0390 Unspecified dementia without behavioral disturbance: Secondary | ICD-10-CM | POA: Diagnosis present

## 2019-10-24 DIAGNOSIS — J1289 Other viral pneumonia: Secondary | ICD-10-CM | POA: Diagnosis present

## 2019-10-24 DIAGNOSIS — F419 Anxiety disorder, unspecified: Secondary | ICD-10-CM | POA: Diagnosis present

## 2019-10-24 DIAGNOSIS — K5900 Constipation, unspecified: Secondary | ICD-10-CM | POA: Diagnosis present

## 2019-10-24 DIAGNOSIS — I6523 Occlusion and stenosis of bilateral carotid arteries: Secondary | ICD-10-CM | POA: Diagnosis present

## 2019-10-24 DIAGNOSIS — H9190 Unspecified hearing loss, unspecified ear: Secondary | ICD-10-CM | POA: Diagnosis present

## 2019-10-24 DIAGNOSIS — Z886 Allergy status to analgesic agent status: Secondary | ICD-10-CM | POA: Diagnosis not present

## 2019-10-24 DIAGNOSIS — U071 COVID-19: Principal | ICD-10-CM

## 2019-10-24 DIAGNOSIS — Z880 Allergy status to penicillin: Secondary | ICD-10-CM

## 2019-10-24 DIAGNOSIS — F329 Major depressive disorder, single episode, unspecified: Secondary | ICD-10-CM | POA: Diagnosis present

## 2019-10-24 DIAGNOSIS — I2510 Atherosclerotic heart disease of native coronary artery without angina pectoris: Secondary | ICD-10-CM | POA: Diagnosis present

## 2019-10-24 DIAGNOSIS — E876 Hypokalemia: Secondary | ICD-10-CM | POA: Diagnosis present

## 2019-10-24 DIAGNOSIS — R32 Unspecified urinary incontinence: Secondary | ICD-10-CM | POA: Diagnosis present

## 2019-10-24 DIAGNOSIS — Z9981 Dependence on supplemental oxygen: Secondary | ICD-10-CM

## 2019-10-24 DIAGNOSIS — Z79899 Other long term (current) drug therapy: Secondary | ICD-10-CM

## 2019-10-24 DIAGNOSIS — J1282 Pneumonia due to coronavirus disease 2019: Secondary | ICD-10-CM | POA: Diagnosis present

## 2019-10-24 DIAGNOSIS — E782 Mixed hyperlipidemia: Secondary | ICD-10-CM | POA: Diagnosis present

## 2019-10-24 DIAGNOSIS — Z8249 Family history of ischemic heart disease and other diseases of the circulatory system: Secondary | ICD-10-CM | POA: Diagnosis not present

## 2019-10-24 DIAGNOSIS — J9811 Atelectasis: Secondary | ICD-10-CM | POA: Diagnosis present

## 2019-10-24 DIAGNOSIS — Z9079 Acquired absence of other genital organ(s): Secondary | ICD-10-CM

## 2019-10-24 DIAGNOSIS — R0902 Hypoxemia: Secondary | ICD-10-CM

## 2019-10-24 DIAGNOSIS — Z7401 Bed confinement status: Secondary | ICD-10-CM

## 2019-10-24 DIAGNOSIS — R791 Abnormal coagulation profile: Secondary | ICD-10-CM | POA: Diagnosis not present

## 2019-10-24 LAB — COMPREHENSIVE METABOLIC PANEL
ALT: 14 U/L (ref 0–44)
AST: 20 U/L (ref 15–41)
Albumin: 3.1 g/dL — ABNORMAL LOW (ref 3.5–5.0)
Alkaline Phosphatase: 33 U/L — ABNORMAL LOW (ref 38–126)
Anion gap: 10 (ref 5–15)
BUN: 10 mg/dL (ref 8–23)
CO2: 31 mmol/L (ref 22–32)
Calcium: 8.4 mg/dL — ABNORMAL LOW (ref 8.9–10.3)
Chloride: 93 mmol/L — ABNORMAL LOW (ref 98–111)
Creatinine, Ser: 0.81 mg/dL (ref 0.61–1.24)
GFR calc Af Amer: >60 mL/min (ref >60)
GFR calc non Af Amer: >60 mL/min (ref >60)
Glucose, Bld: 115 mg/dL — ABNORMAL HIGH (ref 70–99)
Potassium: 2.9 mmol/L — ABNORMAL LOW (ref 3.5–5.1)
Sodium: 134 mmol/L — ABNORMAL LOW (ref 135–145)
Total Bilirubin: 0.9 mg/dL (ref 0.3–1.2)
Total Protein: 5.5 g/dL — ABNORMAL LOW (ref 6.5–8.1)

## 2019-10-24 LAB — CBC WITH DIFFERENTIAL/PLATELET
Abs Immature Granulocytes: 0.02 10*3/uL (ref 0.00–0.07)
Basophils Absolute: 0 10*3/uL (ref 0.0–0.1)
Basophils Relative: 0 %
Eosinophils Absolute: 0 10*3/uL (ref 0.0–0.5)
Eosinophils Relative: 0 %
HCT: 43 % (ref 39.0–52.0)
Hemoglobin: 14.5 g/dL (ref 13.0–17.0)
Immature Granulocytes: 0 %
Lymphocytes Relative: 5 %
Lymphs Abs: 0.3 10*3/uL — ABNORMAL LOW (ref 0.7–4.0)
MCH: 30.2 pg (ref 26.0–34.0)
MCHC: 33.7 g/dL (ref 30.0–36.0)
MCV: 89.6 fL (ref 80.0–100.0)
Monocytes Absolute: 0.5 10*3/uL (ref 0.1–1.0)
Monocytes Relative: 9 %
Neutro Abs: 4.8 10*3/uL (ref 1.7–7.7)
Neutrophils Relative %: 86 %
Platelets: 160 10*3/uL (ref 150–400)
RBC: 4.8 MIL/uL (ref 4.22–5.81)
RDW: 13 % (ref 11.5–15.5)
WBC: 5.6 10*3/uL (ref 4.0–10.5)
nRBC: 0 % (ref 0.0–0.2)

## 2019-10-24 LAB — C-REACTIVE PROTEIN: CRP: 12.2 mg/dL — ABNORMAL HIGH (ref <1.0)

## 2019-10-24 LAB — FIBRINOGEN: Fibrinogen: 651 mg/dL — ABNORMAL HIGH (ref 210–475)

## 2019-10-24 LAB — LACTATE DEHYDROGENASE: LDH: 205 U/L — ABNORMAL HIGH (ref 98–192)

## 2019-10-24 LAB — TRIGLYCERIDES: Triglycerides: 64 mg/dL (ref <150)

## 2019-10-24 LAB — PROCALCITONIN: Procalcitonin: <0.1 ng/mL

## 2019-10-24 LAB — LACTIC ACID, PLASMA
Lactic Acid, Venous: 1 mmol/L (ref 0.5–1.9)
Lactic Acid, Venous: 1 mmol/L (ref 0.5–1.9)

## 2019-10-24 LAB — D-DIMER, QUANTITATIVE: D-Dimer, Quant: 0.98 ug/mL-FEU — ABNORMAL HIGH (ref 0.00–0.50)

## 2019-10-24 LAB — FERRITIN: Ferritin: 294 ng/mL (ref 24–336)

## 2019-10-24 MED ORDER — TAMSULOSIN HCL 0.4 MG PO CAPS
0.4000 mg | ORAL_CAPSULE | Freq: Every day | ORAL | Status: DC
  Administered 2019-10-25 – 2019-10-28 (×4): 0.4 mg via ORAL
  Filled 2019-10-24 (×4): qty 1

## 2019-10-24 MED ORDER — ACETAMINOPHEN 325 MG PO TABS: 650.0000 mg | ORAL_TABLET | Freq: Four times a day (QID) | ORAL | Status: DC | PRN

## 2019-10-24 MED ORDER — ACETAMINOPHEN 650 MG RE SUPP: 650.0000 mg | Freq: Four times a day (QID) | RECTAL | Status: DC | PRN

## 2019-10-24 MED ORDER — POTASSIUM CHLORIDE CRYS ER 20 MEQ PO TBCR
40.0000 meq | EXTENDED_RELEASE_TABLET | Freq: Once | ORAL | Status: AC
  Administered 2019-10-24: 40 meq via ORAL
  Filled 2019-10-24: qty 2

## 2019-10-24 MED ORDER — PANTOPRAZOLE SODIUM 40 MG PO TBEC
40.0000 mg | DELAYED_RELEASE_TABLET | Freq: Two times a day (BID) | ORAL | Status: DC
  Administered 2019-10-25 – 2019-10-28 (×8): 40 mg via ORAL
  Filled 2019-10-24 (×8): qty 1

## 2019-10-24 MED ORDER — CALCIUM CARBONATE 1250 (500 CA) MG PO TABS
1.0000 | ORAL_TABLET | Freq: Two times a day (BID) | ORAL | Status: DC
  Administered 2019-10-25 – 2019-10-28 (×8): 500 mg via ORAL
  Filled 2019-10-24 (×8): qty 1

## 2019-10-24 MED ORDER — ONDANSETRON HCL 4 MG PO TABS: 4.0000 mg | ORAL_TABLET | Freq: Four times a day (QID) | ORAL | Status: DC | PRN

## 2019-10-24 MED ORDER — ALBUTEROL SULFATE HFA 108 (90 BASE) MCG/ACT IN AERS
4.0000 | INHALATION_SPRAY | Freq: Once | RESPIRATORY_TRACT | Status: AC
  Administered 2019-10-24: 4 via RESPIRATORY_TRACT
  Filled 2019-10-24: qty 6.7

## 2019-10-24 MED ORDER — MIRABEGRON ER 25 MG PO TB24
25.0000 mg | ORAL_TABLET | Freq: Every day | ORAL | Status: DC
  Administered 2019-10-25 – 2019-10-28 (×4): 25 mg via ORAL
  Filled 2019-10-24 (×4): qty 1

## 2019-10-24 MED ORDER — DEXAMETHASONE SODIUM PHOSPHATE 10 MG/ML IJ SOLN
6.0000 mg | INTRAMUSCULAR | Status: DC
  Administered 2019-10-25 – 2019-10-28 (×4): 6 mg via INTRAVENOUS
  Filled 2019-10-24 (×4): qty 1

## 2019-10-24 MED ORDER — ASCORBIC ACID 500 MG PO TABS
500.0000 mg | ORAL_TABLET | Freq: Every day | ORAL | Status: DC
  Administered 2019-10-25 – 2019-10-28 (×4): 500 mg via ORAL
  Filled 2019-10-24 (×4): qty 1

## 2019-10-24 MED ORDER — SODIUM CHLORIDE 0.9 % IV SOLN
200.0000 mg | Freq: Once | INTRAVENOUS | Status: AC
  Administered 2019-10-25: 200 mg via INTRAVENOUS
  Filled 2019-10-24: qty 40

## 2019-10-24 MED ORDER — SODIUM CHLORIDE 0.9 % IV SOLN
100.0000 mg | Freq: Every day | INTRAVENOUS | Status: AC
  Administered 2019-10-25 – 2019-10-28 (×4): 100 mg via INTRAVENOUS
  Filled 2019-10-24 (×5): qty 20

## 2019-10-24 MED ORDER — CITALOPRAM HYDROBROMIDE 20 MG PO TABS
20.0000 mg | ORAL_TABLET | Freq: Every day | ORAL | Status: DC
  Administered 2019-10-25 – 2019-10-28 (×4): 20 mg via ORAL
  Filled 2019-10-24 (×4): qty 1

## 2019-10-24 MED ORDER — ONDANSETRON HCL 4 MG/2ML IJ SOLN
4.0000 mg | Freq: Four times a day (QID) | INTRAMUSCULAR | Status: DC | PRN
  Administered 2019-10-28: 4 mg via INTRAVENOUS
  Filled 2019-10-24: qty 2

## 2019-10-24 MED ORDER — PRAVASTATIN SODIUM 40 MG PO TABS
40.0000 mg | ORAL_TABLET | Freq: Every day | ORAL | Status: DC
  Administered 2019-10-25 – 2019-10-27 (×4): 40 mg via ORAL
  Filled 2019-10-24 (×4): qty 1

## 2019-10-24 MED ORDER — DEXAMETHASONE SODIUM PHOSPHATE 10 MG/ML IJ SOLN
6.0000 mg | Freq: Once | INTRAMUSCULAR | Status: AC
  Administered 2019-10-24: 6 mg via INTRAVENOUS
  Filled 2019-10-24: qty 1

## 2019-10-24 MED ORDER — ENOXAPARIN SODIUM 40 MG/0.4ML ~~LOC~~ SOLN
40.0000 mg | SUBCUTANEOUS | Status: DC
  Administered 2019-10-25 – 2019-10-26 (×2): 40 mg via SUBCUTANEOUS
  Filled 2019-10-24 (×2): qty 0.4

## 2019-10-24 MED ORDER — ZINC SULFATE 220 (50 ZN) MG PO CAPS
220.0000 mg | ORAL_CAPSULE | Freq: Two times a day (BID) | ORAL | Status: DC
  Administered 2019-10-25 – 2019-10-28 (×8): 220 mg via ORAL
  Filled 2019-10-24 (×8): qty 1

## 2019-10-24 MED ORDER — LORAZEPAM 0.5 MG PO TABS: 0.2500 mg | ORAL_TABLET | Freq: Every evening | ORAL | Status: DC | PRN

## 2019-10-24 NOTE — H&P (Signed)
History and Physical    Warren [name] [id number] DOB: [birth year] DOA: 10/24/2019  PCP: [name], MD  Patient coming from: [place].  History obtained from patient's son.  Chief Complaint: Shortness of breath.  HPI: Warren [name] is a 85 y.o. male with history of advanced dementia, prostate cancer in remission, depression, CAD was brought to the ER after patient became more short of breath.  As per the patient's son patient's facility has had Covid infection and has had been having routine test being done while patient was found to be positive for Covid infection about 5 days ago.  Last couple days patient has become more short of breath with cough and weakness and was found to be hypoxic.  No reports of any nausea vomiting or diarrhea.  ED Course: In the ER patient was found to be short of breath requiring oxygen with chest x-ray showing infiltrates concerning for pneumonia.  Patient's Covid test was faxed to the ER and the lab will be included in the media section of the chart.  Labs show potassium of 2.9 CRP 12.2 procalcitonin less than 0.10.  Patient was started on Decadron remdesivir and admitted for further management.  Review of Systems: As per HPI, rest all negative.   Past Medical History:  Diagnosis Date  . CAD (coronary artery disease)    moderate by catheterization,11/08...essentially unchanged by repeat study,10/11  . Carotid artery disease (HCC)     less than 50% stenosis bilaterally March 2013   . Claudication ([place])     ABIs within ABIs within normal limits, 40% left iliac disease by catheterization   . Colon polyps   . Depression   . Dizzy   . Edema extremities     secondary to severe varicose veins. Nonpitting edema and dermatitis.   . Emphysema   . Glaucoma   . Hyperlipidemia   . IBS (irritable bowel syndrome)   . Mitral regurgitation    mild  . Mixed dyslipidemia   . Peripheral edema   . Pneumonia   . Prostate  cancer [facility])    s/p radiation treatment/s/p TURP  . Shortness of breath    Emphysema  . Thyroid nodule     posterior and inferior to the right lobepossibly outside of the gland secondary to parathyroid adenoma in addition multiple bilateral thyroid nodules     Past Surgical History:  Procedure Laterality Date  . BIOPSY  03/24/2018   Procedure: BIOPSY;  Surgeon: Jackquline Denmark, MD;  Location: [facility];  Service: Endoscopy;;  . CARDIAC CATHETERIZATION  2008  . CHOLECYSTECTOMY    . CYSTOSCOPY N/A 02/27/2018   Procedure: CYSTOSCOPY;  Surgeon: Bjorn Loser, MD;  Location: [facility];  Service: Urology;  Laterality: N/A;  . ESOPHAGOGASTRODUODENOSCOPY (EGD) WITH PROPOFOL N/A 03/24/2018   Procedure: ESOPHAGOGASTRODUODENOSCOPY (EGD) WITH PROPOFOL;  Surgeon: Jackquline Denmark, MD;  [facility];  Service: Endoscopy;  Laterality: N/A;  . INCONTINENCE SURGERY     ring inserted  . MALONEY DILATION  03/24/2018   Procedure: Venia Minks DILATION;  Surgeon: Jackquline Denmark, MD;  [facility]: [facility];  Service: Endoscopy;;  . TONSILLECTOMY    . TRANSURETHRAL RESECTION OF PROSTATE     has stress urinary incontinence  . URINARY SPHINCTER IMPLANT N/A 02/27/2018   Procedure: REMOVAL INFECTED ARTIFICIAL URINARY SPHINCTER;  Surgeon: Bjorn Loser, MD;  [facility]: [facility];  Service: Urology;  Laterality: N/A;     reports that he quit smoking about 35 years ago. His smoking use included  cigarettes. He has a 40.00 pack-year smoking history. He has never used smokeless tobacco. He reports that he does not drink alcohol or use drugs.  Allergies  Allergen Reactions  . Aspirin Other (See Comments)    Reaction - spots  . Cephalexin Other (See Comments)    Reported by [facility] 08/19/2018  . Latex Other (See Comments)    Unknown reaction  . Penicillins     Has patient had a PCN reaction causing immediate rash, facial/tongue/throat swelling, SOB or lightheadedness with hypotension:No Has patient  had a PCN reaction causing severe rash involving mucus membranes or skin necrosis: No Has patient had a PCN reaction that required hospitalization:NoHas patient had a PCN reaction occurring within the last 10 years:No If all of the above answers are "NO", then may proceed with Cephalosporin use.     Family History  Problem Relation Age of Onset  . Heart attack Sister        in her 15's  . Other Mother        lived to be 30  . Pancreatic cancer Father   . Crohn's disease Son   . Colon cancer Neg Hx     Prior to Admission medications   Medication Sig Start Date End Date Taking? Authorizing Provider  alendronate (FOSAMAX) 70 MG tablet Take 70 mg by mouth every Wednesday. Take with a full glass of water on an empty stomach.   Yes [provider]  ascorbic acid (VITAMIN C) 500 MG tablet Take 500 mg by mouth daily.   Yes [provider]  calcium carbonate (OS-CAL - DOSED IN MG OF ELEMENTAL CALCIUM) 1250 (500 Ca) MG tablet Take 1 tablet by mouth 2 (two) times daily.   Yes [provider]  Cholecalciferol 50 MCG (2000 UT) TABS Take 2,000 Units by mouth daily.    Yes [provider]  citalopram (CELEXA) 20 MG tablet Take 20 mg by mouth daily.    Yes [provider]  guaiFENesin (MUCINEX) 600 MG 12 hr tablet Take 600 mg by mouth 2 (two) times daily.   Yes [provider]  LORazepam (ATIVAN) 0.5 MG tablet Take 0.25-0.5 mg by mouth See admin instructions. Take 1/2 tablet (0.25 mg) by mouth daily at bedtime, may also take 1 tablet (0.5 mg) at bedtime as needed for anxiety   Yes [provider]  mirabegron ER (MYRBETRIQ) 25 MG TB24 tablet Take 25 mg by mouth daily.   Yes [provider]  Nutritional Supplements (NUTRITIONAL SUPPLEMENT PO) Take 120 mLs by mouth 2 (two) times daily with a meal. MedPass 2.0   Yes [provider]  omeprazole (PRILOSEC OTC) 20 MG tablet Take 20 mg by mouth 2 (two) times daily.   Yes [provider]  pravastatin (PRAVACHOL) 40 MG tablet Take 40 mg by mouth at bedtime.    Yes [provider]  sodium chloride (OCEAN) 0.65 % SOLN nasal spray Place 2 sprays into both nostrils 3 (three) times daily as needed for congestion.   Yes [provider]  tamsulosin (FLOMAX) 0.4 MG CAPS capsule Take 0.4 mg by mouth daily.   Yes [provider]  zinc sulfate 220 (50 Zn) MG capsule Take 220 mg by mouth 2 (two) times daily.   Yes [provider]  LORazepam (ATIVAN) 1 MG tablet Take 1 tablet (1 mg total) by mouth at bedtime. Patient not taking: Reported on 10/24/2019 03/03/18   Cristal Ford, DO  oxybutynin (DITROPAN) 5 MG  tablet Take 1 tablet (5 mg total) by mouth 2 (two) times daily. Patient not taking: Reported on 10/24/2019 03/25/18   Velvet Bathe, MD  atorvastatin (LIPITOR) 40 MG tablet Take 40 mg by mouth at bedtime.    01/27/12  [provider]  furosemide (LASIX) 20 MG tablet Take 1 tablet (20 mg total) by mouth daily. 07/30/11 01/27/12  de Stanford Scotland, MD    Physical Exam: Constitutional: Moderately built and nourished. Vitals:   10/24/19 2045 10/24/19 2100 10/24/19 2245 10/24/19 2300  BP: 122/73 124/76 126/77 125/83  Pulse: 73 78 77 75  Resp: (!) 22 (!) 24 19 17   Temp:      TempSrc:      SpO2: 98% 99% 97% 95%   Eyes: Anicteric no pallor. ENMT: No discharge from the ears eyes nose or mouth. Neck: No mass or.  No neck rigidity. Respiratory: No rhonchi or crepitations. Cardiovascular: S1-S2 heard. Abdomen: Soft nontender bowel sounds present. Musculoskeletal: No edema.  No joint effusion. Skin: No rash. Neurologic: Alert awake oriented to his name.  Moves all extremities. Psychiatric: Oriented to his name.   Labs on Admission: I have personally reviewed following labs and imaging studies  CBC: Recent Labs  Lab 10/24/19 1818  WBC 5.6  NEUTROABS 4.8  HGB 14.5  HCT 43.0  MCV 89.6  PLT 0000000   Basic Metabolic Panel:  Recent Labs  Lab 10/24/19 1818  NA 134*  K 2.9*  CL 93*  CO2 31  GLUCOSE 115*  BUN 10  CREATININE 0.81  CALCIUM 8.4*   GFR: CrCl cannot be calculated (Unknown ideal weight.). Liver Function Tests: Recent Labs  Lab 10/24/19 1818  AST 20  ALT 14  ALKPHOS 33*  BILITOT 0.9  PROT 5.5*  ALBUMIN 3.1*   No results for input(s): LIPASE, AMYLASE in the last 168 hours. No results for input(s): AMMONIA in the last 168 hours. Coagulation Profile: No results for input(s): INR, PROTIME in the last 168 hours. Cardiac Enzymes: No results for input(s): CKTOTAL, CKMB, CKMBINDEX, TROPONINI in the last 168 hours. BNP (last 3 results) No results for input(s): PROBNP in the last 8760 hours. HbA1C: No results for input(s): HGBA1C in the last 72 hours. CBG: No results for input(s): GLUCAP in the last 168 hours. Lipid Profile: Recent Labs    10/24/19 1818  TRIG 64   Thyroid Function Tests: No results for input(s): TSH, T4TOTAL, FREET4, T3FREE, THYROIDAB in the last 72 hours. Anemia Panel: Recent Labs    10/24/19 1818  FERRITIN 294   Urine analysis:    Component Value Date/Time   COLORURINE YELLOW 03/22/2018 1616   APPEARANCEUR HAZY (A) 03/22/2018 1616   LABSPEC 1.015 03/22/2018 1616   PHURINE 7.0 03/22/2018 1616   GLUCOSEU NEGATIVE 03/22/2018 1616   HGBUR MODERATE (A) 03/22/2018 1616   BILIRUBINUR NEGATIVE 03/22/2018 1616   KETONESUR NEGATIVE 03/22/2018 1616   PROTEINUR 30 (A) 03/22/2018 1616   NITRITE NEGATIVE 03/22/2018 1616   LEUKOCYTESUR LARGE (A) 03/22/2018 1616   Sepsis Labs: @LABRCNTIP (procalcitonin:4,lacticidven:4) )No results found for this or any previous visit (from the past 240 hour(s)).   Radiological Exams on Admission: DG Chest Port 1 View  Result Date: 10/24/2019 CLINICAL DATA:  Shortness of breath. COVID-19 positive. EXAM: PORTABLE CHEST 1 VIEW COMPARISON:  03/02/2018. FINDINGS: Interval patchy opacity in the medial aspects of the lower half of the  left lung with obscuration of the left heart border. No significant change in a small amount of linear density at  the right lung base, possibly representing scarring or atelectasis. Poor inspiration. Old, healed inferior right scapula fracture. IMPRESSION: 1. Interval patchy opacity in the medial aspects of the lower half of the left lung compatible with pneumonia. 2. Stable small amount of linear scarring or atelectasis at the right lung base. Electronically Signed   By: Claudie Revering M.D.   On: 10/24/2019 19:05    EKG: Independently reviewed.  Normal sinus rhythm.  Assessment/Plan Principal Problem:   Acute respiratory failure with hypoxia (HCC) Active Problems:   CAD (coronary artery disease)   Dementia ([place])    1. Acute respiratory failure with hypoxia secondary to COVID-19 infection for which patient is on Decadron and remdesivir.  Patient's COVID-19 test was done at the facility results have been faxed and will be updated in the chart in the media section.  Closely follow respiratory status inflammatory markers. 2. Hypokalemia -replace recheck check magnesium levels. 3. History of depression and anxiety on Celexa and lorazepam. 4. History of dementia and urinary incontinence. 5. Hyperlipidemia on statins.  Since patient has acute respiratory failure with COVID-19 pneumonia will need inpatient status.   DVT prophylaxis: Lovenox. Code Status: DNR confirmed with patient's son.  Okay for BiPAP. Family Communication: Patient's son. Disposition Plan: Back to facility when stable. Consults called: None. Admission status: Inpatient.   Rise Patience MD [facility] Pager [phone].  If 7PM-7AM, please contact night-coverage [email or web address] Password [facility]  10/24/2019, 11:32 PM

## 2019-10-24 NOTE — ED Notes (Signed)
[facility] for request to fax + results paperwork

## 2019-10-24 NOTE — ED Notes (Addendum)
Documents stating pt's positive Covid status. Placed in medical records, waiting to be scanned. Copy also with pt.

## 2019-10-24 NOTE — ED Provider Notes (Signed)
[place] EMERGENCY DEPARTMENT Provider Note   CSN: [id number] Arrival date & time: 10/24/19  1717     History Chief Complaint  Patient presents with  . COVID    Warren [name] is a 85 y.o. male.  HPI    Patient presents to the emergency room for evaluation of worsening shortness of breath associated with known COVID-19 diagnosis.  Patient is a resident of a nursing facility.  He started having symptoms about a week ago.  Patient tested positive.  In the last day or so staff at the facility noted the patient was having more difficulty breathing.  He had some abnormal respiratory sounds on auscultation when he was examined.  The also noted to his oxygen saturation dropped below 90 on room air.  No known fevers.  No vomiting or diarrhea.  At the patient's advanced age he is primarily bedridden but does not require oxygen and is able to carry on clear conversations.  Patient denies any complaints right now in the emergency room.  He denies having any trouble with his breathing.  He denies having any fevers.  He denies any pain. Past Medical History:  Diagnosis Date  . CAD (coronary artery disease)    moderate by catheterization,11/08...essentially unchanged by repeat study,10/11  . Carotid artery disease (HCC)     less than 50% stenosis bilaterally March 2013   . Claudication ([place])     ABIs within ABIs within normal limits, 40% left iliac disease by catheterization   . Colon polyps   . Depression   . Dizzy   . Edema extremities     secondary to severe varicose veins. Nonpitting edema and dermatitis.   . Emphysema   . Glaucoma   . Hyperlipidemia   . IBS (irritable bowel syndrome)   . Mitral regurgitation    mild  . Mixed dyslipidemia   . Peripheral edema   . Pneumonia   . Prostate cancer [facility])    s/p radiation treatment/s/p TURP  . Shortness of breath    Emphysema  . Thyroid nodule     posterior and inferior to the right lobepossibly outside of  the gland secondary to parathyroid adenoma in addition multiple bilateral thyroid nodules     Patient Active Problem List   Diagnosis Date Noted  . Acute respiratory failure with hypoxia ([place]) 10/24/2019  . Barrett's esophagus 04/03/2018  . Candida esophagitis ([place]) 04/03/2018  . Anxiety 04/03/2018  . Esophageal dysfunction 03/23/2018  . Urinary tract infection associated with catheterization of urinary tract, initial encounter ([place]) 03/23/2018  . Leukocytosis 03/23/2018  . Dementia ([place]) 03/23/2018  . Dehydration   . Acute metabolic encephalopathy   . Nausea and vomiting 03/22/2018  . Pseudomonas urinary tract infection 03/06/2018  . Depression 03/06/2018  . Anxiety and depression 03/06/2018  . Osteoporosis 03/06/2018  . Acute lower UTI 03/01/2018  . Internal device, implant, or graft infection or inflammation 03/01/2018  . Emphysema   . Thyroid nodule   . Carotid artery disease ([place])   . CAD (coronary artery disease)   . Edema extremities   . Claudication ([place]) 07/30/2011  . Dyslipidemia 07/03/2010  . HYPERTENSION, BENIGN 07/03/2010  . PERIPHERAL EDEMA 07/03/2010  . SHORTNESS OF BREATH 07/03/2010  . PRECORDIAL PAIN 07/03/2010    Past Surgical History:  Procedure Laterality Date  . BIOPSY  03/24/2018   Procedure: BIOPSY;  Surgeon: Jackquline Denmark, MD;  [facility];  Service: Endoscopy;;  . CARDIAC CATHETERIZATION  2008  . CHOLECYSTECTOMY    .  CYSTOSCOPY N/A 02/27/2018   Procedure: CYSTOSCOPY;  Surgeon: Bjorn Loser, MD;  [facility];  Service: Urology;  Laterality: N/A;  . ESOPHAGOGASTRODUODENOSCOPY (EGD) WITH PROPOFOL N/A 03/24/2018   Procedure: ESOPHAGOGASTRODUODENOSCOPY (EGD) WITH PROPOFOL;  Surgeon: Jackquline Denmark, MD;  [facility];  Service: Endoscopy;  Laterality: N/A;  . INCONTINENCE SURGERY     ring inserted  . MALONEY DILATION  03/24/2018   Procedure: Venia Minks DILATION;  Surgeon: Jackquline Denmark, MD;  [facility];  Service:  Endoscopy;;  . TONSILLECTOMY    . TRANSURETHRAL RESECTION OF PROSTATE     has stress urinary incontinence  . URINARY SPHINCTER IMPLANT N/A 02/27/2018   Procedure: REMOVAL INFECTED ARTIFICIAL URINARY SPHINCTER;  Surgeon: Bjorn Loser, MD;  [facility];  Service: Urology;  Laterality: N/A;       Family History  Problem Relation Age of Onset  . Heart attack Sister        in her 105's  . Other Mother        lived to be 63  . Pancreatic cancer Father   . Crohn's disease Son   . Colon cancer Neg Hx     Social History   Tobacco Use  . Smoking status: Former Smoker    Packs/day: 1.00    Years: 40.00    Pack years: 40.00    Types: Cigarettes    Quit date: 11/04/1983    Years since quitting: 35.9  . Smokeless tobacco: Never Used  Substance Use Topics  . Alcohol use: No  . Drug use: No    Home Medications Prior to Admission medications   Medication Sig Start Date End Date Taking? Authorizing Provider  bisacodyl (DULCOLAX) 10 MG suppository Place 10 mg rectally daily as needed for moderate constipation.    [provider]  calcium carbonate (TUMS - DOSED IN MG ELEMENTAL CALCIUM) 500 MG chewable tablet Chew 2 tablets by mouth daily.    [provider]  Cholecalciferol 1000 units tablet Take 2,000 Units by mouth daily. 2 tabs    [provider]  citalopram (CELEXA) 20 MG tablet Take 20 mg by mouth daily.     [provider]  LORazepam (ATIVAN) 1 MG tablet Take 1 tablet (1 mg total) by mouth at bedtime. 03/03/18   [name], Velta Addison, DO  magnesium hydroxide (MILK OF MAGNESIA) 400 MG/5ML suspension Take 30 mLs by mouth daily as needed for mild constipation.    [provider]  MYRBETRIQ 50 MG TB24 tablet Take 50 mg by mouth daily.  02/17/18   [provider]  oxybutynin (DITROPAN) 5 MG tablet Take 1 tablet (5 mg total) by mouth 2 (two) times daily. 03/25/18   Velvet Bathe, MD  pravastatin (PRAVACHOL) 40 MG tablet Take 40 mg by  mouth daily.     [provider]  atorvastatin (LIPITOR) 40 MG tablet Take 40 mg by mouth at bedtime.    01/27/12  [provider]  furosemide (LASIX) 20 MG tablet Take 1 tablet (20 mg total) by mouth daily. 07/30/11 01/27/12  Ezra Sites, MD    Allergies    Aspirin, Latex, and Penicillins  Review of Systems   Review of Systems  All other systems reviewed and are negative.   Physical Exam Updated Vital Signs BP 124/76   Pulse 78   Temp 98.2 F (36.8 C) (Oral)   Resp (!) 24   SpO2 99%   Physical Exam Vitals and nursing note reviewed.  Constitutional:  General: He is not in acute distress.    Appearance: He is well-developed.     Comments: Elderly  HENT:     Head: Normocephalic and atraumatic.     Right Ear: External ear normal.     Left Ear: External ear normal.  Eyes:     General: No scleral icterus.       Right eye: No discharge.        Left eye: No discharge.     Conjunctiva/sclera: Conjunctivae normal.  Neck:     Trachea: No tracheal deviation.  Cardiovascular:     Rate and Rhythm: Normal rate and regular rhythm.  Pulmonary:     Effort: Pulmonary effort is normal. No respiratory distress.     Breath sounds: No stridor. Wheezing and rales present.  Abdominal:     General: Bowel sounds are normal. There is no distension.     Palpations: Abdomen is soft.     Tenderness: There is no abdominal tenderness. There is no guarding or rebound.  Musculoskeletal:        General: No tenderness.     Cervical back: Neck supple.  Skin:    General: Skin is warm and dry.     Findings: No rash.  Neurological:     Mental Status: He is alert.     Cranial Nerves: No cranial nerve deficit (no facial droop, extraocular movements intact, no slurred speech).     Sensory: No sensory deficit.     Motor: No abnormal muscle tone or seizure activity.     Coordination: Coordination normal.     ED Results / Procedures / Treatments   Labs (all labs ordered are  listed, but only abnormal results are displayed) Labs Reviewed  CBC WITH DIFFERENTIAL/PLATELET - Abnormal; Notable for the following components:      Result Value   Lymphs Abs 0.3 (*)    All other components within normal limits  COMPREHENSIVE METABOLIC PANEL - Abnormal; Notable for the following components:   Sodium 134 (*)    Potassium 2.9 (*)    Chloride 93 (*)    Glucose, Bld 115 (*)    Calcium 8.4 (*)    Total Protein 5.5 (*)    Albumin 3.1 (*)    Alkaline Phosphatase 33 (*)    All other components within normal limits  D-DIMER, QUANTITATIVE (NOT AT [facility]) - Abnormal; Notable for the following components:   D-Dimer, Quant 0.98 (*)    All other components within normal limits  LACTATE DEHYDROGENASE - Abnormal; Notable for the following components:   LDH 205 (*)    All other components within normal limits  FIBRINOGEN - Abnormal; Notable for the following components:   Fibrinogen 651 (*)    All other components within normal limits  C-REACTIVE PROTEIN - Abnormal; Notable for the following components:   CRP 12.2 (*)    All other components within normal limits  CULTURE, BLOOD (ROUTINE X 2)  CULTURE, BLOOD (ROUTINE X 2)  SARS CORONAVIRUS 2 (TAT 6-24 HRS)  LACTIC ACID, PLASMA  PROCALCITONIN  FERRITIN  TRIGLYCERIDES  LACTIC ACID, PLASMA    EKG EKG Interpretation  Date/Time:  Monday October 24 2019 18:08:01 EST Ventricular Rate:  77 PR Interval:    QRS Duration: 112 QT Interval:  413 QTC Calculation: 468 R Axis:   -2 Text Interpretation: Sinus rhythm Incomplete RBBB and LAFB No significant change since last tracing Confirmed by Dorie Rank [phone]) on 10/24/2019 6:37:39 PM   Radiology DG Chest [facility]  1 View  Result Date: 10/24/2019 CLINICAL DATA:  Shortness of breath. COVID-19 positive. EXAM: PORTABLE CHEST 1 VIEW COMPARISON:  03/02/2018. FINDINGS: Interval patchy opacity in the medial aspects of the lower half of the left lung with obscuration of the left heart  border. No significant change in a small amount of linear density at the right lung base, possibly representing scarring or atelectasis. Poor inspiration. Old, healed inferior right scapula fracture. IMPRESSION: 1. Interval patchy opacity in the medial aspects of the lower half of the left lung compatible with pneumonia. 2. Stable small amount of linear scarring or atelectasis at the right lung base. Electronically Signed   By: Claudie Revering M.D.   On: 10/24/2019 19:05    Procedures Procedures (including critical care time)  Medications Ordered in ED Medications  albuterol (VENTOLIN HFA) 108 (90 Base) MCG/ACT inhaler 4 puff (4 puffs Inhalation Given 10/24/19 1832)  dexamethasone (DECADRON) injection 6 mg (6 mg Intravenous Given 10/24/19 2059)  potassium chloride SA (KLOR-CON) CR tablet 40 mEq (40 mEq Oral Given 10/24/19 2101)    ED Course  I have reviewed the triage vital signs and the nursing notes.  Pertinent labs & imaging results that were available during my care of the patient were reviewed by me and considered in my medical decision making (see chart for details).  Clinical Course as of Oct 23 2124  Mon Oct 24, 2019  1910 Normal  CBC WITH DIFFERENTIAL(!) [JK]  1910 Chest x-ray demonstrates pneumonia  DG Chest Port 1 View [JK]  [phone] Patient does have a new oxygen requirement.  He is on nasal cannula   [JK]    Clinical Course User Index [JK] Dorie Rank, MD   MDM Rules/Calculators/A&P                      Laboratory test show mild electrolyte abnormalities including hypokalemia.  Chest x-ray does show an infiltrate consistent with Covid pneumonia.  Patient remains nontoxic.  He does not appear septic however he does have an oxygen requirement and infiltrates on x-rays.  Patient was started on IV Decadron.  Plan is for admission for further treatment of Covid pneumonia.  QUENDARIUS BURROUS was evaluated in Emergency Department on 10/24/2019 for the symptoms described in the  history of present illness. He was evaluated in the context of the global COVID-19 pandemic, which necessitated consideration that the patient might be at risk for infection with the SARS-CoV-2 virus that causes COVID-19. Institutional protocols and algorithms that pertain to the evaluation of patients at risk for COVID-19 are in a state of rapid change based on information released by regulatory bodies including the CDC and federal and state organizations. These policies and algorithms were followed during the patient's care in the ED.  Final Clinical Impression(s) / ED Diagnoses Final diagnoses:  Pneumonia due to COVID-19 virus  Hypokalemia  Hypoxemia      Dorie Rank, MD 10/24/19 2126

## 2019-10-24 NOTE — ED Notes (Addendum)
Kathlee Nations cell/grandaughter....Marland KitchenMarland [phone], notify her first on pts. Status per request

## 2019-10-24 NOTE — ED Triage Notes (Signed)
Pt BIB [facility] from [facility] with COVID-19. Facility checked oxygen saturation and it was 91% RA. EMS placed pt on 2L [place] and O2 Saturation rose to 95%+.  Pt is visibly SOB with small amount of accessory muscle use.   Upon arrival oxygen sat was 93% RA; placed on 2L and is now 100%  Hx of Dementia   VSS with EMS.

## 2019-10-25 DIAGNOSIS — Z66 Do not resuscitate: Secondary | ICD-10-CM | POA: Diagnosis present

## 2019-10-25 LAB — CBC WITH DIFFERENTIAL/PLATELET
Abs Immature Granulocytes: 0.03 10*3/uL (ref 0.00–0.07)
Basophils Absolute: 0 10*3/uL (ref 0.0–0.1)
Basophils Relative: 0 %
Eosinophils Absolute: 0 10*3/uL (ref 0.0–0.5)
Eosinophils Relative: 0 %
HCT: 39.9 % (ref 39.0–52.0)
Hemoglobin: 13.5 g/dL (ref 13.0–17.0)
Immature Granulocytes: 1 %
Lymphocytes Relative: 4 %
Lymphs Abs: 0.2 10*3/uL — ABNORMAL LOW (ref 0.7–4.0)
MCH: 30.2 pg (ref 26.0–34.0)
MCHC: 33.8 g/dL (ref 30.0–36.0)
MCV: 89.3 fL (ref 80.0–100.0)
Monocytes Absolute: 0.3 10*3/uL (ref 0.1–1.0)
Monocytes Relative: 6 %
Neutro Abs: 4.1 10*3/uL (ref 1.7–7.7)
Neutrophils Relative %: 89 %
Platelets: 152 10*3/uL (ref 150–400)
RBC: 4.47 MIL/uL (ref 4.22–5.81)
RDW: 12.9 % (ref 11.5–15.5)
WBC: 4.6 10*3/uL (ref 4.0–10.5)
nRBC: 0 % (ref 0.0–0.2)

## 2019-10-25 LAB — COMPREHENSIVE METABOLIC PANEL
ALT: 13 U/L (ref 0–44)
AST: 17 U/L (ref 15–41)
Albumin: 2.7 g/dL — ABNORMAL LOW (ref 3.5–5.0)
Alkaline Phosphatase: 31 U/L — ABNORMAL LOW (ref 38–126)
Anion gap: 13 (ref 5–15)
BUN: 13 mg/dL (ref 8–23)
CO2: 30 mmol/L (ref 22–32)
Calcium: 8.6 mg/dL — ABNORMAL LOW (ref 8.9–10.3)
Chloride: 94 mmol/L — ABNORMAL LOW (ref 98–111)
Creatinine, Ser: 0.78 mg/dL (ref 0.61–1.24)
GFR calc Af Amer: >60 mL/min (ref >60)
GFR calc non Af Amer: >60 mL/min (ref >60)
Glucose, Bld: 164 mg/dL — ABNORMAL HIGH (ref 70–99)
Potassium: 2.9 mmol/L — ABNORMAL LOW (ref 3.5–5.1)
Sodium: 137 mmol/L (ref 135–145)
Total Bilirubin: 0.7 mg/dL (ref 0.3–1.2)
Total Protein: 5.5 g/dL — ABNORMAL LOW (ref 6.5–8.1)

## 2019-10-25 LAB — CREATININE, SERUM
Creatinine, Ser: 0.74 mg/dL (ref 0.61–1.24)
GFR calc Af Amer: >60 mL/min (ref >60)
GFR calc non Af Amer: >60 mL/min (ref >60)

## 2019-10-25 LAB — CBC
HCT: 39.9 % (ref 39.0–52.0)
Hemoglobin: 13.3 g/dL (ref 13.0–17.0)
MCH: 29.9 pg (ref 26.0–34.0)
MCHC: 33.3 g/dL (ref 30.0–36.0)
MCV: 89.7 fL (ref 80.0–100.0)
Platelets: 154 10*3/uL (ref 150–400)
RBC: 4.45 MIL/uL (ref 4.22–5.81)
RDW: 13 % (ref 11.5–15.5)
WBC: 7.2 10*3/uL (ref 4.0–10.5)
nRBC: 0 % (ref 0.0–0.2)

## 2019-10-25 LAB — ABO/RH: ABO/RH(D): A POS

## 2019-10-25 LAB — D-DIMER, QUANTITATIVE: D-Dimer, Quant: 0.71 ug/mL-FEU — ABNORMAL HIGH (ref 0.00–0.50)

## 2019-10-25 LAB — FERRITIN: Ferritin: 335 ng/mL (ref 24–336)

## 2019-10-25 LAB — MAGNESIUM: Magnesium: 1.9 mg/dL (ref 1.7–2.4)

## 2019-10-25 LAB — C-REACTIVE PROTEIN: CRP: 10.9 mg/dL — ABNORMAL HIGH (ref <1.0)

## 2019-10-25 MED ORDER — ALBUTEROL SULFATE HFA 108 (90 BASE) MCG/ACT IN AERS: 1.0000 | INHALATION_SPRAY | Freq: Four times a day (QID) | RESPIRATORY_TRACT | Status: DC

## 2019-10-25 MED ORDER — GUAIFENESIN ER 600 MG PO TB12
600.0000 mg | ORAL_TABLET | Freq: Two times a day (BID) | ORAL | Status: DC
  Administered 2019-10-25 – 2019-10-26 (×3): 600 mg via ORAL
  Filled 2019-10-25 (×3): qty 1

## 2019-10-25 MED ORDER — ALBUTEROL SULFATE HFA 108 (90 BASE) MCG/ACT IN AERS
1.0000 | INHALATION_SPRAY | Freq: Four times a day (QID) | RESPIRATORY_TRACT | Status: DC
  Administered 2019-10-25 – 2019-10-28 (×10): 1 via RESPIRATORY_TRACT
  Filled 2019-10-25: qty 6.7

## 2019-10-25 MED ORDER — POTASSIUM CHLORIDE CRYS ER 20 MEQ PO TBCR
40.0000 meq | EXTENDED_RELEASE_TABLET | Freq: Two times a day (BID) | ORAL | Status: AC
  Administered 2019-10-25 (×2): 40 meq via ORAL
  Filled 2019-10-25 (×2): qty 2

## 2019-10-25 NOTE — Progress Notes (Signed)
PROGRESS NOTE    Warren [name]  [id number] DOB: 1922-05-18 DOA: 10/24/2019 PCP: [name], MD   Brief Narrative: 85 year old with past medical history significant for advanced dementia, prostate cancer in remission, depression, CAD who was brought to the ER after patient became more short of breath.  Patient was found to be positive for Covid 5 days prior to admission.  Patient present with acute hypoxic respiratory failure oxygen 94 on room air, chest x-ray with left lung with infiltrate.   Assessment & Plan:   Principal Problem:   Acute respiratory failure with hypoxia (HCC) Active Problems:   CAD (coronary artery disease)   Dementia (HCC)   Pneumonia due to COVID-19 virus   DNR (do not resuscitate)  1-Acute Hypoxic respiratory failure secondary to COVID-19 pneumonia: -Patient with oxygen 94 on room air, improved on 2 L.  Chest x-ray with infiltrate. -Continue with Remdesivir, and dexamethasone.  -Continue with vitamin C and zinc -Schedule albuterol and guaifenesin. -Patient's COVID-19 test was done at the facility results have been faxed and will be updated in the chart in the media section COVID-19 Labs  Recent Labs    10/24/19 1818 10/25/19 0700  DDIMER 0.98* 0.71*  FERRITIN 294 335  LDH 205*  --   CRP 12.2* 10.9*    No results found for: SARSCOV2NAA 2-Depression;  Continue with Celexa  Hyperlipidemia continue with Pravachol Hypokalemia; replete orally. Mg at 1.9  Prostate;  Continue with Flomax   Estimated body mass index is 25.37 kg/m as calculated from the following:   Height as of 04/19/18: 5\' 6"  (1.676 m).   Weight as of this encounter: 71.3 kg.   DVT prophylaxis: Lovenox Code Status: DNR Family Communication: Family didn't answer phone  Disposition Plan: Remain in the hospital for treatment of respiratory failure and pneumonia from Covid  Consultants:   None  Procedures:   None  Antimicrobials:   Remdesivir  Subjective: Patient is alert, hard of hearing, confused, reports cough and shortness of breath  Objective: Vitals:   10/25/19 0600 10/25/19 0850 10/25/19 1200 10/25/19 1400  BP:  125/87 (!) 152/83   Pulse:   74 75  Resp:  16  18  Temp: 97.7 F (36.5 C)   97.7 F (36.5 C)  TempSrc: Oral   Oral  SpO2:  95% 98% 96%  Weight:        Intake/Output Summary (Last 24 hours) at 10/25/2019 1521 Last data filed at 10/25/2019 1400 Gross per 24 hour  Intake 990 ml  Output --  Net 990 ml   Filed Weights   10/25/19 0300  Weight: 71.3 kg    Examination:  General exam: Appears calm and comfortable  Respiratory system: Bilateral rhonchorous Cardiovascular system: S1 & S2 heard, RRR. No JVD, murmurs, rubs, gallops or clicks. No pedal edema. Gastrointestinal system: Abdomen is nondistended, soft and nontender. No organomegaly or masses felt. Normal bowel sounds heard. Central nervous system: Alert confused Extremities: Symmetric 5 x 5 power. Skin: No rashes, lesions or ulcers   Data Reviewed: I have personally reviewed following labs and imaging studies  CBC: Recent Labs  Lab 10/24/19 1818 10/24/19 2357 10/25/19 0700  WBC 5.6 7.2 4.6  NEUTROABS 4.8  --  4.1  HGB 14.5 13.3 13.5  HCT 43.0 39.9 39.9  MCV 89.6 89.7 89.3  PLT 160 154 0000000   Basic Metabolic Panel: Recent Labs  Lab 10/24/19 1818 10/24/19 2357 10/25/19 0700  NA 134*  --  137  K 2.9*  --  2.9*  CL 93*  --  94*  CO2 31  --  30  GLUCOSE 115*  --  164*  BUN 10  --  13  CREATININE 0.81 0.74 0.78  CALCIUM 8.4*  --  8.6*  MG  --   --  1.9   GFR: CrCl cannot be calculated (Unknown ideal weight.). Liver Function Tests: Recent Labs  Lab 10/24/19 1818 10/25/19 0700  AST 20 17  ALT 14 13  ALKPHOS 33* 31*  BILITOT 0.9 0.7  PROT 5.5* 5.5*  ALBUMIN 3.1* 2.7*   No results for input(s): LIPASE, AMYLASE in the last 168 hours. No results for input(s): AMMONIA in the last 168 hours. Coagulation  Profile: No results for input(s): INR, PROTIME in the last 168 hours. Cardiac Enzymes: No results for input(s): CKTOTAL, CKMB, CKMBINDEX, TROPONINI in the last 168 hours. BNP (last 3 results) No results for input(s): PROBNP in the last 8760 hours. HbA1C: No results for input(s): HGBA1C in the last 72 hours. CBG: No results for input(s): GLUCAP in the last 168 hours. Lipid Profile: Recent Labs    10/24/19 1818  TRIG 64   Thyroid Function Tests: No results for input(s): TSH, T4TOTAL, FREET4, T3FREE, THYROIDAB in the last 72 hours. Anemia Panel: Recent Labs    10/24/19 1818 10/25/19 0700  FERRITIN 294 335   Sepsis Labs: Recent Labs  Lab 10/24/19 1818 10/24/19 2304  PROCALCITON <0.10  --   LATICACIDVEN 1.0 1.0    Recent Results (from the past 240 hour(s))  Blood Culture (routine x 2)     Status: None (Preliminary result)   Collection Time: 10/24/19  6:18 PM   Specimen: BLOOD  Result Value Ref Range Status   Specimen Description BLOOD RIGHT ANTECUBITAL  Final   Special Requests   Final    BOTTLES DRAWN AEROBIC AND ANAEROBIC Blood Culture adequate volume   Culture   Final    NO GROWTH < 24 HOURS Performed at [facility], [place] [address] [place], [place] [address]    Report Status PENDING  Incomplete  Blood Culture (routine x 2)     Status: None (Preliminary result)   Collection Time: 10/24/19  6:30 PM   Specimen: BLOOD LEFT WRIST  Result Value Ref Range Status   Specimen Description BLOOD LEFT WRIST  Final   Special Requests   Final    BOTTLES DRAWN AEROBIC ONLY Blood Culture results may not be optimal due to an inadequate volume of blood received in culture bottles   Culture   Final    NO GROWTH < 24 HOURS Performed at [facility], [place] [address] [place], [place] [address]    Report Status PENDING  Incomplete         Radiology Studies: DG Chest Port 1 View  Result Date: 10/24/2019 CLINICAL DATA:  Shortness of breath. COVID-19 positive.  EXAM: PORTABLE CHEST 1 VIEW COMPARISON:  03/02/2018. FINDINGS: Interval patchy opacity in the medial aspects of the lower half of the left lung with obscuration of the left heart border. No significant change in a small amount of linear density at the right lung base, possibly representing scarring or atelectasis. Poor inspiration. Old, healed inferior right scapula fracture. IMPRESSION: 1. Interval patchy opacity in the medial aspects of the lower half of the left lung compatible with pneumonia. 2. Stable small amount of linear scarring or atelectasis at the right lung base. Electronically Signed   By: Claudie Revering M.D.   On: 10/24/2019  19:05        Scheduled Meds: . ascorbic acid  500 mg Oral Daily  . calcium carbonate  1 tablet Oral BID  . citalopram  20 mg Oral Daily  . dexamethasone (DECADRON) injection  6 mg Intravenous Q24H  . enoxaparin (LOVENOX) injection  40 mg Subcutaneous Q24H  . mirabegron ER  25 mg Oral Daily  . pantoprazole  40 mg Oral BID  . potassium chloride  40 mEq Oral BID  . pravastatin  40 mg Oral QHS  . tamsulosin  0.4 mg Oral Daily  . zinc sulfate  220 mg Oral BID   Continuous Infusions: . remdesivir 100 mg in NS 100 mL 100 mg (10/25/19 1053)     LOS: 1 day    Time spent: 35 minutes.     Elmarie Shiley, MD [facility]   If 7PM-7AM, please contact night-coverage [email or web address] Password [email or web address] 10/25/2019, 3:21 PM

## 2019-10-25 NOTE — ED Notes (Signed)
Attempted report, to call back in 5 minutes

## 2019-10-25 NOTE — Progress Notes (Signed)
Pt is sitting up, eating dinner.  Bottom and top dentures and hearing aid, on left are all in.  Pt is in a good mood, says he feels fine.   Still on [place].

## 2019-10-25 NOTE — ED Notes (Signed)
ED TO INPATIENT HANDOFF REPORT  ED Nurse Name and Phone #: Jori Moll [phone]  S Name/Age/Gender [name] 85 y.o. male Room/Bed: 006C/006C  Code Status   Code Status: DNR  Home/SNF/Other Home Patient oriented to: self, place and situation Is this baseline? Yes   Triage Complete: Triage complete  Chief Complaint Acute respiratory failure with hypoxia ([place]) [J96.01]  Triage Note [facility] with [facility]-19. Facility checked oxygen saturation and it was 91% RA. EMS placed pt on 2L [place] and O2 Saturation rose to 95%+.  Pt is visibly SOB with small amount of accessory muscle use.   Upon arrival oxygen sat was 93% RA; placed on 2L and is now 100%  Hx of Dementia   VSS with EMS.     Allergies Allergies  Allergen Reactions  . Aspirin Other (See Comments)    Reaction - spots  . Cephalexin Other (See Comments)    Reported by [facility] 08/19/2018  . Latex Other (See Comments)    Unknown reaction  . Penicillins     Has patient had a PCN reaction causing immediate rash, facial/tongue/throat swelling, SOB or lightheadedness with hypotension:No Has patient had a PCN reaction causing severe rash involving mucus membranes or skin necrosis: No Has patient had a PCN reaction that required hospitalization:NoHas patient had a PCN reaction occurring within the last 10 years:No If all of the above answers are "NO", then may proceed with Cephalosporin use.     Level of Care/Admitting Diagnosis ED Disposition    ED Disposition Condition Comment   Admit  Hospital Area: [facility] [[id number]]  Level of Care: Telemetry Medical [104]  Covid Evaluation: Symptomatic Person Under Investigation (PUI)  Diagnosis: Acute respiratory failure with hypoxia [id number]  Admitting Physician: Rise Patience [phone]  Attending Physician: Rise Patience [phone]  Estimated length of stay: past midnight tomorrow  Certification:: I  certify this patient will need inpatient services for at least 2 midnights       B Medical/Surgery History Past Medical History:  Diagnosis Date  . CAD (coronary artery disease)    moderate by catheterization,11/08...essentially unchanged by repeat study,10/11  . Carotid artery disease (HCC)     less than 50% stenosis bilaterally March 2013   . Claudication ([place])     ABIs within ABIs within normal limits, 40% left iliac disease by catheterization   . Colon polyps   . Depression   . Dizzy   . Edema extremities     secondary to severe varicose veins. Nonpitting edema and dermatitis.   . Emphysema   . Glaucoma   . Hyperlipidemia   . IBS (irritable bowel syndrome)   . Mitral regurgitation    mild  . Mixed dyslipidemia   . Peripheral edema   . Pneumonia   . Prostate cancer [facility])    s/p radiation treatment/s/p TURP  . Shortness of breath    Emphysema  . Thyroid nodule     posterior and inferior to the right lobepossibly outside of the gland secondary to parathyroid adenoma in addition multiple bilateral thyroid nodules    Past Surgical History:  Procedure Laterality Date  . BIOPSY  03/24/2018   Procedure: BIOPSY;  Surgeon: Jackquline Denmark, MD;  Location: [facility];  Service: Endoscopy;;  . CARDIAC CATHETERIZATION  2008  . CHOLECYSTECTOMY    . CYSTOSCOPY N/A 02/27/2018   Procedure: CYSTOSCOPY;  Surgeon: Bjorn Loser, MD;  Location: [facility] ORS;  Service: Urology;  Laterality: N/A;  .  ESOPHAGOGASTRODUODENOSCOPY (EGD) WITH PROPOFOL N/A 03/24/2018   Procedure: ESOPHAGOGASTRODUODENOSCOPY (EGD) WITH PROPOFOL;  Surgeon: Jackquline Denmark, MD;  Location: [facility];  Service: Endoscopy;  Laterality: N/A;  . INCONTINENCE SURGERY     ring inserted  . MALONEY DILATION  03/24/2018   Procedure: Venia Minks DILATION;  Surgeon: Jackquline Denmark, MD;  Location: [facility];  Service: Endoscopy;;  . TONSILLECTOMY    . TRANSURETHRAL RESECTION OF PROSTATE     has stress urinary incontinence  .  URINARY SPHINCTER IMPLANT N/A 02/27/2018   Procedure: REMOVAL INFECTED ARTIFICIAL URINARY SPHINCTER;  Surgeon: Bjorn Loser, MD;  Location: [facility]S;  Service: Urology;  Laterality: N/A;     A IV Location/Drains/Wounds Patient Lines/Drains/Airways Status   Active Line/Drains/Airways    Name:   Placement date:   Placement time:   Site:   Days:   Peripheral IV 10/24/19 Right Antecubital   10/24/19    1822    Antecubital   1   Urethral Catheter Dr. [name] Straight-tip;Double-lumen;Non-latex 14 Fr.   02/27/18    1015    Straight-tip;Double-lumen;Non-latex   605   Incision (Closed) 02/27/18 Perineum    02/27/18    1051     605   Incision (Closed) 02/27/18 Abdomen Right   02/27/18    1056     605          Intake/Output Last 24 hours  Intake/Output Summary (Last 24 hours) at 10/25/2019 0142 Last data filed at 10/25/2019 0104 Gross per 24 hour  Intake 290 ml  Output --  Net 290 ml    Labs/Imaging Results for orders placed or performed during the hospital encounter of 10/24/19 (from the past 48 hour(s))  Lactic acid, plasma     Status: None   Collection Time: 10/24/19  6:18 PM  Result Value Ref Range   Lactic Acid, Venous 1.0 0.5 - 1.9 mmol/L    Comment: Performed at [facility], [address] [place], [place] [address]  CBC WITH DIFFERENTIAL     Status: Abnormal   Collection Time: 10/24/19  6:18 PM  Result Value Ref Range   WBC 5.6 4.0 - 10.5 K/uL   RBC 4.80 4.22 - 5.81 MIL/uL   Hemoglobin 14.5 13.0 - 17.0 g/dL   HCT 43.0 39.0 - 52.0 %   MCV 89.6 80.0 - 100.0 fL   MCH 30.2 26.0 - 34.0 pg   MCHC 33.7 30.0 - 36.0 g/dL   RDW 13.0 11.5 - 15.5 %   Platelets 160 150 - 400 K/uL   nRBC 0.0 0.0 - 0.2 %   Neutrophils Relative % 86 %   Neutro Abs 4.8 1.7 - 7.7 K/uL   Lymphocytes Relative 5 %   Lymphs Abs 0.3 (L) 0.7 - 4.0 K/uL   Monocytes Relative 9 %   Monocytes Absolute 0.5 0.1 - 1.0 K/uL   Eosinophils Relative 0 %   Eosinophils Absolute 0.0 0.0 - 0.5 K/uL    Basophils Relative 0 %   Basophils Absolute 0.0 0.0 - 0.1 K/uL   Immature Granulocytes 0 %   Abs Immature Granulocytes 0.02 0.00 - 0.07 K/uL    Comment: Performed at [facility] [address], [place], [place] [address]  Comprehensive metabolic panel     Status: Abnormal   Collection Time: 10/24/19  6:18 PM  Result Value Ref Range   Sodium 134 (L) 135 - 145 mmol/L   Potassium 2.9 (L) 3.5 - 5.1 mmol/L   Chloride 93 (L) 98 -  111 mmol/L   CO2 31 22 - 32 mmol/L   Glucose, Bld 115 (H) 70 - 99 mg/dL   BUN 10 8 - 23 mg/dL   Creatinine, Ser 0.81 0.61 - 1.24 mg/dL   Calcium 8.4 (L) 8.9 - 10.3 mg/dL   Total Protein 5.5 (L) 6.5 - 8.1 g/dL   Albumin 3.1 (L) 3.5 - 5.0 g/dL   AST 20 15 - 41 U/L   ALT 14 0 - 44 U/L   Alkaline Phosphatase 33 (L) 38 - 126 U/L   Total Bilirubin 0.9 0.3 - 1.2 mg/dL   GFR calc non Af Amer >60 >60 mL/min   GFR calc Af Amer >60 >60 mL/min   Anion gap 10 5 - 15    Comment: Performed at [facility] [address], [place], [place] [address]  D-dimer, quantitative     Status: Abnormal   Collection Time: 10/24/19  6:18 PM  Result Value Ref Range   D-Dimer, Quant 0.98 (H) 0.00 - 0.50 ug/mL-FEU    Comment: (NOTE) At the manufacturer cut-off of 0.50 ug/mL FEU, this assay has been documented to exclude PE with a sensitivity and negative predictive value of 97 to 99%.  At this time, this assay has not been approved by the FDA to exclude DVT/VTE. Results should be correlated with clinical presentation. Performed at [facility], [place] [address], [place], [place] [address]   Procalcitonin     Status: None   Collection Time: 10/24/19  6:18 PM  Result Value Ref Range   Procalcitonin <0.10 ng/mL    Comment:        Interpretation: PCT (Procalcitonin) <= 0.5 ng/mL: Systemic infection (sepsis) is not likely. Local bacterial infection is possible. (NOTE)       Sepsis PCT Algorithm           Lower Respiratory Tract                                       Infection PCT Algorithm    ----------------------------     ----------------------------         PCT < 0.25 ng/mL                PCT < 0.10 ng/mL         Strongly encourage             Strongly discourage   discontinuation of antibiotics    initiation of antibiotics    ----------------------------     -----------------------------       PCT 0.25 - 0.50 ng/mL            PCT 0.10 - 0.25 ng/mL               OR       >80% decrease in PCT            Discourage initiation of                                            antibiotics      Encourage discontinuation           of antibiotics    ----------------------------     -----------------------------         PCT >= 0.50 ng/mL  PCT 0.26 - 0.50 ng/mL               AND        <80% decrease in PCT             Encourage initiation of                                             antibiotics       Encourage continuation           of antibiotics    ----------------------------     -----------------------------        PCT >= 0.50 ng/mL                  PCT > 0.50 ng/mL               AND         increase in PCT                  Strongly encourage                                      initiation of antibiotics    Strongly encourage escalation           of antibiotics                                     -----------------------------                                           PCT <= 0.25 ng/mL                                                 OR                                        > 80% decrease in PCT                                     Discontinue / Do not initiate                                             antibiotics Performed at [facility], [address], [place], [place] [address]   Lactate dehydrogenase     Status: Abnormal   Collection Time: 10/24/19  6:18 PM  Result Value Ref Range   LDH 205 (H) 98 - 192 U/L    Comment: Performed at [facility], [place] [address], [place], [place] [address]  Ferritin     Status:  None   Collection Time: 10/24/19  6:18 PM  Result Value Ref  Range   Ferritin 294 24 - 336 ng/mL    Comment: Performed at [facility] [address], [place], [place] [address]  Triglycerides     Status: None   Collection Time: 10/24/19  6:18 PM  Result Value Ref Range   Triglycerides 64 <150 mg/dL    Comment: Performed at [facility] [address] [place], [place] [address]  Fibrinogen     Status: Abnormal   Collection Time: 10/24/19  6:18 PM  Result Value Ref Range   Fibrinogen 651 (H) 210 - 475 mg/dL    Comment: Performed at [facility] [address] [place], [place] [address]  C-reactive protein     Status: Abnormal   Collection Time: 10/24/19  6:18 PM  Result Value Ref Range   CRP 12.2 (H) <1.0 mg/dL    Comment: Performed at [facility] [address], [place], [place] [address]  Lactic acid, plasma     Status: None   Collection Time: 10/24/19 11:04 PM  Result Value Ref Range   Lactic Acid, Venous 1.0 0.5 - 1.9 mmol/L    Comment: Performed at [facility] [address], [place] [address]  CBC     Status: None   Collection Time: 10/24/19 11:57 PM  Result Value Ref Range   WBC 7.2 4.0 - 10.5 K/uL   RBC 4.45 4.22 - 5.81 MIL/uL   Hemoglobin 13.3 13.0 - 17.0 g/dL   HCT 39.9 39.0 - 52.0 %   MCV 89.7 80.0 - 100.0 fL   MCH 29.9 26.0 - 34.0 pg   MCHC 33.3 30.0 - 36.0 g/dL   RDW 13.0 11.5 - 15.5 %   Platelets 154 150 - 400 K/uL   nRBC 0.0 0.0 - 0.2 %    Comment: Performed at [facility], [place] [address], [place], [place] [address]  Creatinine, serum     Status: None   Collection Time: 10/24/19 11:57 PM  Result Value Ref Range   Creatinine, Ser 0.74 0.61 - 1.24 mg/dL   GFR calc non Af Amer >60 >60 mL/min   GFR calc Af Amer >60 >60 mL/min    Comment: Performed at [facility] [address], [place], [place] [address]  ABO/Rh     Status: None   Collection Time: 10/24/19 11:57 PM  Result Value Ref Range    ABO/RH(D)      A POS Performed at [facility] [address], [place], [place] [address]    DG Chest Port 1 View  Result Date: 10/24/2019 CLINICAL DATA:  Shortness of breath. COVID-19 positive. EXAM: PORTABLE CHEST 1 VIEW COMPARISON:  03/02/2018. FINDINGS: Interval patchy opacity in the medial aspects of the lower half of the left lung with obscuration of the left heart border. No significant change in a small amount of linear density at the right lung base, possibly representing scarring or atelectasis. Poor inspiration. Old, healed inferior right scapula fracture. IMPRESSION: 1. Interval patchy opacity in the medial aspects of the lower half of the left lung compatible with pneumonia. 2. Stable small amount of linear scarring or atelectasis at the right lung base. Electronically Signed   By: Claudie Revering M.D.   On: 10/24/2019 19:05    Pending Labs Unresulted Labs (From admission, onward)    Start     Ordered   10/31/19 0500  Creatinine, serum  (enoxaparin (LOVENOX)    CrCl >/=  30 ml/min)  Weekly,   R    Comments: while on enoxaparin therapy    10/24/19 2328   10/25/19 [id number]  Basic metabolic panel  Tomorrow morning,   R     10/24/19 2328   10/25/19 0500  CBC  Tomorrow morning,   R     10/24/19 2328   10/24/19 2007  SARS CORONAVIRUS 2 (TAT 6-24 HRS) Nasopharyngeal Nasopharyngeal Swab  (Tier 3 (TAT 6-24 hrs))  Once,   STAT    Question Answer Comment  Is this test for diagnosis or screening Screening   Symptomatic for COVID-19 as defined by CDC No   Hospitalized for COVID-19 No   Admitted to ICU for COVID-19 No   Previously tested for COVID-19 No   Resident in a congregate (group) care setting No   Employed in healthcare setting No      10/24/19 2006   10/24/19 1745  Blood Culture (routine x 2)  BLOOD CULTURE X 2,   STAT     10/24/19 1747          Vitals/Pain Today's Vitals   10/25/19 0036 10/25/19 0100 10/25/19 0105 10/25/19 0130  BP: 116/71 (!) 108/59  (!) 126/59   Pulse: 72 68  74  Resp: (!) 24 (!) 22  (!) 21  Temp:      TempSrc:      SpO2: 96% 95%  96%  PainSc:   Asleep     Isolation Precautions Airborne and Contact precautions  Medications Medications  pravastatin (PRAVACHOL) tablet 40 mg (40 mg Oral Given 10/25/19 0028)  citalopram (CELEXA) tablet 20 mg (has no administration in time range)  LORazepam (ATIVAN) tablet 0.25-0.5 mg (has no administration in time range)  pantoprazole (PROTONIX) EC tablet 40 mg (40 mg Oral Given 10/25/19 0028)  mirabegron ER (MYRBETRIQ) tablet 25 mg (has no administration in time range)  tamsulosin (FLOMAX) capsule 0.4 mg (has no administration in time range)  ascorbic acid (VITAMIN C) tablet 500 mg (has no administration in time range)  calcium carbonate (OS-CAL - dosed in mg of elemental calcium) tablet 500 mg of elemental calcium (500 mg of elemental calcium Oral Given 10/25/19 0029)  zinc sulfate capsule 220 mg (220 mg Oral Given 10/25/19 0029)  acetaminophen (TYLENOL) tablet 650 mg (has no administration in time range)    Or  acetaminophen (TYLENOL) suppository 650 mg (has no administration in time range)  ondansetron (ZOFRAN) tablet 4 mg (has no administration in time range)    Or  ondansetron (ZOFRAN) injection 4 mg (has no administration in time range)  enoxaparin (LOVENOX) injection 40 mg (has no administration in time range)  remdesivir 200 mg in sodium chloride 0.9% 250 mL IVPB (0 mg Intravenous Stopped 10/25/19 0104)    Followed by  remdesivir 100 mg in sodium chloride 0.9 % 100 mL IVPB (has no administration in time range)  dexamethasone (DECADRON) injection 6 mg (has no administration in time range)  albuterol (VENTOLIN HFA) 108 (90 Base) MCG/ACT inhaler 4 puff (4 puffs Inhalation Given 10/24/19 1832)  dexamethasone (DECADRON) injection 6 mg (6 mg Intravenous Given 10/24/19 2059)  potassium chloride SA (KLOR-CON) CR tablet 40 mEq (40 mEq Oral Given 10/24/19 2101)    Mobility power  wheelchair Low fall risk   Focused Assessments Pulmonary Assessment Handoff:  Lung sounds: Bilateral Breath Sounds: Expiratory wheezes O2 Device: Nasal Cannula O2 Flow Rate (L/min): 100 L/min      R Recommendations: See Admitting Provider Note  Report given to:  Additional Notes: Has received remdesivir. 20 gauge IV in right [facility]

## 2019-10-25 NOTE — Progress Notes (Addendum)
Pt presented to the floor disoriented times 4. Pt was unable to answer any admission questions. Pt is very hard of hearing per ED RN. The required 2 RN skin check was done with no skin issues noted at the time of admission. RN was unable to educate the pt about the call bell or phone system. However the phone and call bell was placed with in the pt's reach. Bed alarm was placed on the bed to ensure the pt safety.

## 2019-10-26 DIAGNOSIS — F0390 Unspecified dementia without behavioral disturbance: Secondary | ICD-10-CM

## 2019-10-26 DIAGNOSIS — Z66 Do not resuscitate: Secondary | ICD-10-CM

## 2019-10-26 DIAGNOSIS — J1289 Other viral pneumonia: Secondary | ICD-10-CM

## 2019-10-26 DIAGNOSIS — U071 COVID-19: Principal | ICD-10-CM

## 2019-10-26 DIAGNOSIS — I2510 Atherosclerotic heart disease of native coronary artery without angina pectoris: Secondary | ICD-10-CM

## 2019-10-26 LAB — COMPREHENSIVE METABOLIC PANEL
ALT: 16 U/L (ref 0–44)
AST: 23 U/L (ref 15–41)
Albumin: 3 g/dL — ABNORMAL LOW (ref 3.5–5.0)
Alkaline Phosphatase: 36 U/L — ABNORMAL LOW (ref 38–126)
Anion gap: 9 (ref 5–15)
BUN: 17 mg/dL (ref 8–23)
CO2: 33 mmol/L — ABNORMAL HIGH (ref 22–32)
Calcium: 8.9 mg/dL (ref 8.9–10.3)
Chloride: 96 mmol/L — ABNORMAL LOW (ref 98–111)
Creatinine, Ser: 0.81 mg/dL (ref 0.61–1.24)
GFR calc Af Amer: >60 mL/min (ref >60)
GFR calc non Af Amer: >60 mL/min (ref >60)
Glucose, Bld: 139 mg/dL — ABNORMAL HIGH (ref 70–99)
Potassium: 4.2 mmol/L (ref 3.5–5.1)
Sodium: 138 mmol/L (ref 135–145)
Total Bilirubin: 0.5 mg/dL (ref 0.3–1.2)
Total Protein: 6 g/dL — ABNORMAL LOW (ref 6.5–8.1)

## 2019-10-26 LAB — C-REACTIVE PROTEIN: CRP: 5.9 mg/dL — ABNORMAL HIGH (ref <1.0)

## 2019-10-26 LAB — FERRITIN: Ferritin: 465 ng/mL — ABNORMAL HIGH (ref 24–336)

## 2019-10-26 LAB — D-DIMER, QUANTITATIVE: D-Dimer, Quant: 1.62 ug/mL-FEU — ABNORMAL HIGH (ref 0.00–0.50)

## 2019-10-26 LAB — SARS CORONAVIRUS 2 (TAT 6-24 HRS): SARS Coronavirus 2: POSITIVE — AB

## 2019-10-26 MED ORDER — FLUTICASONE PROPIONATE 50 MCG/ACT NA SUSP
1.0000 | Freq: Two times a day (BID) | NASAL | Status: DC
  Administered 2019-10-26 – 2019-10-28 (×5): 1 via NASAL
  Filled 2019-10-26: qty 16

## 2019-10-26 MED ORDER — MENTHOL 3 MG MT LOZG
1.0000 | LOZENGE | Freq: Four times a day (QID) | OROMUCOSAL | Status: DC
  Administered 2019-10-26 – 2019-10-28 (×8): 3 mg via ORAL
  Filled 2019-10-26 (×7): qty 9

## 2019-10-26 MED ORDER — GUAIFENESIN-DM 100-10 MG/5ML PO SYRP: 5.0000 mL | ORAL_SOLUTION | ORAL | Status: DC | PRN

## 2019-10-26 MED ORDER — HYDROCOD POLST-CPM POLST ER 10-8 MG/5ML PO SUER
5.0000 mL | Freq: Two times a day (BID) | ORAL | Status: DC
  Administered 2019-10-26 – 2019-10-28 (×5): 5 mL via ORAL
  Filled 2019-10-26 (×5): qty 5

## 2019-10-26 NOTE — Progress Notes (Signed)
PROGRESS NOTE    Warren [name]  [id number] DOB: August 04, 1922 DOA: 10/24/2019 PCP: [name], MD    Brief Narrative:  85 year old male who presented with dyspnea.  He does have significant past medical history for advanced dementia, coronary artery disease, depression and prostate cancer.  Patient is a nursing home resident, at his facility there was a Covid outbreak and he was routinely screened.  He tested positive for COVID-19 December 16.  For the last 2 days prior to hospitalization patient became short of breath, he had generalized weakness and cough.  Positive oxygen desaturation.  On his initial physical examination his blood pressure was 122/73, pulse rate 73, respiratory 24, oxygen saturation 95%, his lungs had no rhonchi or rales, heart S1-S2 present rhythmic, abdomen soft, no lower extremity edema.  Chest radiograph, left rotation, positive left lower lobe interstitial infiltrate, right base atelectasis, (personally reviewed).  EKG 77 bpm, left axis deviation, sinus rhythm, no ST segment or T wave changes.  Patient was admitted to the hospital with a working diagnosis of acute hypoxic respiratory failure due to SARS COVID-19 viral pneumonia.  Patient has been responding well to medical therapy with remdesivir and dexamethasone.  Assessment & Plan:   Principal Problem:   Acute respiratory failure with hypoxia (HCC) Active Problems:   CAD (coronary artery disease)   Dementia ([place])   Pneumonia due to COVID-19 virus   DNR (do not resuscitate)   1.  Acute hypoxic respiratory failure due to SARS COVID-19 viral pneumonia.  RR: 19  Pulse oxymetry: 94%  Fi02: 2 L/ min per [place].  COVID-19 Labs  Recent Labs    10/24/19 1818 10/25/19 0700  DDIMER 0.98* 0.71*  FERRITIN 294 335  LDH 205*  --   CRP 12.2* 10.9*    No results found for: SARSCOV2NAA  Inflammatory markers are trending down.  Will continue medical therapy with Remdesivir #/5, systemic  corticosteroids with dexamethasone, antitussive agents, bronchodilators and airway clearing techniques with incentive spirometer and flutter valve.   Will add flonase for nasal congestion and lozanges for sore throat.   Positive lower extremity edema on the right, will get US doppler to rule out DVT.  2. Dyslipidemia. Continue pravachol  3. Depression/ decreased hearing. Will continue with celexa.   4. BPH. No signs of urinary retention, will continue flomax.   5. Hypokalemia/ with contraction metabolic alkalosis. K up to 4,2 from 2,9, renal function preserved with serum cr at 0,81, bicarb at 30. will continue to follow on renal function and electrolytes. Will continue to encourage po intake, will hold on IV fluids for now.   DVT prophylaxis: enoxaparin   Code Status: dnr  Family Communication: no family at the bedside  Disposition Plan/ discharge barriers: pending clinical improvement.    Nutrition Status:           Skin Documentation:     Consultants:     Procedures:     Antimicrobials:       Subjective: Patient is feeling better, but not yet back to baseline, continue to have dyspnea and cough, positive nasal congestion, no nausea or vomiting, positive decreased hearing.   Objective: Vitals:   10/25/19 1800 10/25/19 2110 10/26/19 0000 10/26/19 0504  BP:   137/79   Pulse: 74  78   Resp: (!) 25  19   Temp:  98.4 F (36.9 C)  97.9 F (36.6 C)  TempSrc:      SpO2: 93%  94%   Weight:  Intake/Output Summary (Last 24 hours) at 10/26/2019 0854 Last data filed at 10/25/2019 1400 Gross per 24 hour  Intake 460 ml  Output --  Net 460 ml   Filed Weights   10/25/19 0300  Weight: 71.3 kg    Examination:   General: Not in pain or dyspnea, deconditioned  Neurology: Awake and alert, non focal  E ENT: mild pallor, no icterus, oral mucosa moist Cardiovascular: No JVD. S1-S2 present, rhythmic, no gallops, rubs, or murmurs. Right lower extremity edema  non pitting. Pulmonary: positive breath sounds bilaterally. Gastrointestinal. Abdomen with no organomegaly, non tender, no rebound or guarding Skin. No rashes Musculoskeletal: no joint deformities     Data Reviewed: I have personally reviewed following labs and imaging studies  CBC: Recent Labs  Lab 10/24/19 1818 10/24/19 2357 10/25/19 0700  WBC 5.6 7.2 4.6  NEUTROABS 4.8  --  4.1  HGB 14.5 13.3 13.5  HCT 43.0 39.9 39.9  MCV 89.6 89.7 89.3  PLT 160 154 0000000   Basic Metabolic Panel: Recent Labs  Lab 10/24/19 1818 10/24/19 2357 10/25/19 0700  NA 134*  --  137  K 2.9*  --  2.9*  CL 93*  --  94*  CO2 31  --  30  GLUCOSE 115*  --  164*  BUN 10  --  13  CREATININE 0.81 0.74 0.78  CALCIUM 8.4*  --  8.6*  MG  --   --  1.9   GFR: CrCl cannot be calculated (Unknown ideal weight.). Liver Function Tests: Recent Labs  Lab 10/24/19 1818 10/25/19 0700  AST 20 17  ALT 14 13  ALKPHOS 33* 31*  BILITOT 0.9 0.7  PROT 5.5* 5.5*  ALBUMIN 3.1* 2.7*   No results for input(s): LIPASE, AMYLASE in the last 168 hours. No results for input(s): AMMONIA in the last 168 hours. Coagulation Profile: No results for input(s): INR, PROTIME in the last 168 hours. Cardiac Enzymes: No results for input(s): CKTOTAL, CKMB, CKMBINDEX, TROPONINI in the last 168 hours. BNP (last 3 results) No results for input(s): PROBNP in the last 8760 hours. HbA1C: No results for input(s): HGBA1C in the last 72 hours. CBG: No results for input(s): GLUCAP in the last 168 hours. Lipid Profile: Recent Labs    10/24/19 1818  TRIG 64   Thyroid Function Tests: No results for input(s): TSH, T4TOTAL, FREET4, T3FREE, THYROIDAB in the last 72 hours. Anemia Panel: Recent Labs    10/24/19 1818 10/25/19 0700  FERRITIN 294 335      Radiology Studies: I have reviewed all of the imaging during this hospital visit personally     Scheduled Meds: . albuterol  1 puff Inhalation Q6H  . ascorbic acid  500  mg Oral Daily  . calcium carbonate  1 tablet Oral BID  . citalopram  20 mg Oral Daily  . dexamethasone (DECADRON) injection  6 mg Intravenous Q24H  . enoxaparin (LOVENOX) injection  40 mg Subcutaneous Q24H  . guaiFENesin  600 mg Oral BID  . mirabegron ER  25 mg Oral Daily  . pantoprazole  40 mg Oral BID  . pravastatin  40 mg Oral QHS  . tamsulosin  0.4 mg Oral Daily  . zinc sulfate  220 mg Oral BID   Continuous Infusions: . remdesivir 100 mg in NS 100 mL Stopped (10/25/19 1155)     LOS: 2 days        Ladarian Bonczek Gerome Apley, MD

## 2019-10-26 NOTE — Evaluation (Signed)
Physical Therapy Evaluation Patient Details Name: Warren [name] MRN: [id number] DOB: [birth year] Today's Date: 10/26/2019   History of Present Illness  Pt is a 85 year old man admitted from SNF with dyspnea, generalized weakness, cough and hypoxia due to Covid 19 PNA. PMH: advanced dementia, CAD, depression, prostate cancer.  Clinical Impression  Pt admitted with above diagnosis. Pt was able to ambulate in room to door and back to chair with slight desat at end of walk with quick recovery.  Pt required min assist for stability with RW.  Recommend SNF to maximize function.  Pt currently with functional limitations due to the deficits listed below (see PT Problem List). Pt will benefit from skilled PT to increase their independence and safety with mobility to allow discharge to the venue listed below.      Follow Up Recommendations SNF;Supervision/Assistance - 24 hour    Equipment Recommendations  Other (comment)(TBA)    Recommendations for Other Services       Precautions / Restrictions Precautions Precautions: Fall Restrictions Weight Bearing Restrictions: No      Mobility  Bed Mobility Overal bed mobility: Needs Assistance Bed Mobility: Supine to Sit;Sit to Supine     Supine to sit: Min assist Sit to supine: Mod assist   General bed mobility comments: pulled up on therapist's hand, used bed pad to pivot hips to EOB  Transfers Overall transfer level: Needs assistance Equipment used: Rolling walker (2 wheeled) Transfers: Sit to/from Stand Sit to Stand: Min assist         General transfer comment: assist to rise and steady, initially stabilizing LEs on bed and needing assist to shift weight anterior  Ambulation/Gait Ambulation/Gait assistance: Min assist Gait Distance (Feet): 25 Feet Assistive device: Rolling walker (2 wheeled) Gait Pattern/deviations: Step-through pattern;Decreased stride length;Staggering left;Staggering right;Trunk flexed;Wide base of  support   Gait velocity interpretation: <1.31 ft/sec, indicative of household ambulator General Gait Details: Pt needed cues for safety with RW as well as cues to stay close to RW.  Pt on 2LO2 with sats 90-94% with ambulation. Dropped to 86% at end of walk but rebounded quickly.    Stairs            Wheelchair Mobility    Modified Rankin (Stroke Patients Only)       Balance Overall balance assessment: Needs assistance Sitting-balance support: No upper extremity supported;Feet supported Sitting balance-Leahy Scale: Fair     Standing balance support: Bilateral upper extremity supported Standing balance-Leahy Scale: Poor Standing balance comment: reliant on B UE and external support                             Pertinent Vitals/Pain Pain Assessment: No/denies pain    Home Living Family/patient expects to be discharged to:: Skilled nursing facility                      Prior Function Level of Independence: Needs assistance   Gait / Transfers Assistance Needed: pt reports walking with a RW  ADL's / Homemaking Assistance Needed: self fed, assisted for bathing, dressing and toileting, incontinent of bowel        Hand Dominance   Dominant Hand: Right    Extremity/Trunk Assessment   Upper Extremity Assessment Upper Extremity Assessment: Defer to OT evaluation    Lower Extremity Assessment Lower Extremity Assessment: Generalized weakness       Communication   Communication: HOH  Cognition Arousal/Alertness: Awake/alert  Behavior During Therapy: WFL for tasks assessed/performed Overall Cognitive Status: History of cognitive impairments - at baseline                                        General Comments      Exercises General Exercises - Lower Extremity Ankle Circles/Pumps: AROM;Both;10 reps;Supine Long Arc Quad: AROM;Both;10 reps;Seated   Assessment/Plan    PT Assessment Patient needs continued PT services  PT  Problem List Decreased activity tolerance;Decreased balance;Decreased mobility;Decreased knowledge of use of DME;Decreased safety awareness;Decreased knowledge of precautions;Cardiopulmonary status limiting activity       PT Treatment Interventions DME instruction;Gait training;Functional mobility training;Therapeutic activities;Therapeutic exercise;Balance training;Patient/family education    PT Goals (Current goals can be found in the Care Plan section)  Acute Rehab PT Goals Patient Stated Goal: to go home PT Goal Formulation: With patient Time For Goal Achievement: 11/09/19 Potential to Achieve Goals: Good    Frequency Min 3X/week   Barriers to discharge Decreased caregiver support      Co-evaluation               AM-PAC PT "6 Clicks" Mobility  Outcome Measure Help needed turning from your back to your side while in a flat bed without using bedrails?: A Little Help needed moving from lying on your back to sitting on the side of a flat bed without using bedrails?: A Little Help needed moving to and from a bed to a chair (including a wheelchair)?: A Little Help needed standing up from a chair using your arms (e.g., wheelchair or bedside chair)?: A Little Help needed to walk in hospital room?: A Little Help needed climbing 3-5 steps with a railing? : A Lot 6 Click Score: 17    End of Session Equipment Utilized During Treatment: Gait belt;Oxygen Activity Tolerance: Patient limited by fatigue Patient left: in chair;with call bell/phone within reach;with chair alarm set Nurse Communication: Mobility status PT Visit Diagnosis: Unsteadiness on feet (R26.81);Muscle weakness (generalized) (M62.81)    Time: [id number] PT Time Calculation (min) (ACUTE ONLY): 21 min   Charges:   PT Evaluation $PT Eval Moderate Complexity: 1 Mod          Artha Stavros W,PT Acute Rehabilitation Services Pager:  [phone]  Office:  [place] 10/26/2019, 4:41 PM

## 2019-10-26 NOTE — Evaluation (Signed)
Occupational Therapy Evaluation Patient Details Name: Warren [name] MRN: [id number] DOB: Mar 29, 1922 Today's Date: 10/26/2019    History of Present Illness Pt is a 85 year old man admitted from SNF with dyspnea, generalized weakness, cough and hypoxia due to Covid 19 PNA. PMH: advanced dementia, CAD, depression, prostate cancer.   Clinical Impression   Pt is a questionable historian and call to his facility for further information went unanswered, but he reports using a walker, feeding himself and having assistance for bathing and dressing prior to admission. Pt cooperative, but requiring some encouragement for OOB because he was cold. Pt maintaining 94-96% Sp02 on 2L during session. Will follow acutely. Recommending return to SNF upon discharge.    Follow Up Recommendations  SNF;Supervision/Assistance - 24 hour    Equipment Recommendations  None recommended by OT    Recommendations for Other Services       Precautions / Restrictions Precautions Precautions: Fall Restrictions Weight Bearing Restrictions: No      Mobility Bed Mobility Overal bed mobility: Needs Assistance Bed Mobility: Supine to Sit;Sit to Supine     Supine to sit: Mod assist Sit to supine: Mod assist   General bed mobility comments: pulled up on therapist's hand, used bed pad to pivot hips to EOB, assisted LEs back into bed  Transfers Overall transfer level: Needs assistance Equipment used: Rolling walker (2 wheeled) Transfers: Sit to/from Stand Sit to Stand: Min assist         General transfer comment: assist to rise and steady, initially stabilizing LEs on bed and needing assist to shift weight anterior    Balance Overall balance assessment: Needs assistance   Sitting balance-Leahy Scale: Fair     Standing balance support: Bilateral upper extremity supported Standing balance-Leahy Scale: Poor Standing balance comment: reliant on B UE and external support                           ADL either performed or assessed with clinical judgement   ADL Overall ADL's : Needs assistance/impaired Eating/Feeding: Set up;Bed level   Grooming: Wash/dry hands;Standing;Minimal assistance   Upper Body Bathing: Moderate assistance;Sitting   Lower Body Bathing: Moderate assistance;Sit to/from stand   Upper Body Dressing : Minimal assistance;Sitting   Lower Body Dressing: Moderate assistance;Sit to/from stand   Toilet Transfer: Minimal assistance;Stand-pivot;RW   Toileting- Clothing Manipulation and Hygiene: Maximal assistance;Sit to/from stand               Vision Baseline Vision/History: Wears glasses Wears Glasses: At all times Patient Visual Report: No change from baseline       Perception     Praxis      Pertinent Vitals/Pain Pain Assessment: No/denies pain     Hand Dominance Right   Extremity/Trunk Assessment Upper Extremity Assessment Upper Extremity Assessment: Overall WFL for tasks assessed   Lower Extremity Assessment Lower Extremity Assessment: Defer to PT evaluation       Communication Communication Communication: HOH   Cognition Arousal/Alertness: Awake/alert Behavior During Therapy: WFL for tasks assessed/performed Overall Cognitive Status: History of cognitive impairments - at baseline                                     General Comments       Exercises     Shoulder Instructions      Home Living Family/patient expects to be discharged to:: Skilled  nursing facility                                        Prior Functioning/Environment Level of Independence: Needs assistance  Gait / Transfers Assistance Needed: pt reports walking with a RW ADL's / Homemaking Assistance Needed: self fed, assisted for bathing, dressing and toileting, incontinent of bowel            OT Problem List: Decreased strength;Decreased activity tolerance;Impaired balance (sitting and/or standing);Decreased  knowledge of use of DME or AE;Decreased cognition;Decreased safety awareness      OT Treatment/Interventions: Self-care/ADL training;DME and/or AE instruction;Balance training;Patient/family education;Therapeutic activities    OT Goals(Current goals can be found in the care plan section) Acute Rehab OT Goals Patient Stated Goal: to stay warm in his bed OT Goal Formulation: With patient Time For Goal Achievement: 11/09/19 Potential to Achieve Goals: Good ADL Goals Pt Will Perform Grooming: standing;with supervision Pt Will Transfer to Toilet: with supervision;ambulating Additional ADL Goal #1: Pt will perform bed mobility with supervision in preparation for ADL.  OT Frequency: Min 2X/week   Barriers to D/C:            Co-evaluation              AM-PAC OT "6 Clicks" Daily Activity     Outcome Measure Help from another person eating meals?: A Little Help from another person taking care of personal grooming?: A Little Help from another person toileting, which includes using toliet, bedpan, or urinal?: A Lot Help from another person bathing (including washing, rinsing, drying)?: A Lot Help from another person to put on and taking off regular upper body clothing?: A Little Help from another person to put on and taking off regular lower body clothing?: A Lot 6 Click Score: 15   End of Session Equipment Utilized During Treatment: Rolling walker;Gait belt;Oxygen(2L)  Activity Tolerance: Patient tolerated treatment well Patient left: in bed;with call bell/phone within reach;with bed alarm set  OT Visit Diagnosis: Other abnormalities of gait and mobility (R26.89);Unsteadiness on feet (R26.81);Muscle weakness (generalized) (M62.81);Other symptoms and signs involving cognitive function                Time: 1430-1447 OT Time Calculation (min): 17 min Charges:  OT General Charges $OT Visit: 1 Visit OT Evaluation $OT Eval Moderate Complexity: 1 Mod  Nestor Lewandowsky, OTR/L Acute  Rehabilitation Services Pager: [phone] Office: [phone]  Malka So 10/26/2019, 3:32 PM

## 2019-10-26 NOTE — Plan of Care (Signed)
  Problem: Education: Goal: Knowledge of risk factors and measures for prevention of condition will improve Outcome: Progressing   Problem: Coping: Goal: Psychosocial and spiritual needs will be supported Outcome: Progressing   Problem: Respiratory: Goal: Will maintain a patent airway Outcome: Progressing Goal: Complications related to the disease process, condition or treatment will be avoided or minimized Outcome: Progressing   Problem: Education: Goal: Knowledge of General Education information will improve Description: Including pain rating scale, medication(s)/side effects and non-pharmacologic comfort measures Outcome: Progressing   Problem: Clinical Measurements: Goal: Will remain free from infection Outcome: Progressing Goal: Diagnostic test results will improve Outcome: Progressing Goal: Respiratory complications will improve Outcome: Progressing   Problem: Activity: Goal: Risk for activity intolerance will decrease Outcome: Progressing   Problem: Nutrition: Goal: Adequate nutrition will be maintained Outcome: Progressing   Problem: Coping: Goal: Level of anxiety will decrease Outcome: Progressing   Problem: Pain Managment: Goal: General experience of comfort will improve Outcome: Progressing   Problem: Safety: Goal: Ability to remain free from injury will improve Outcome: Progressing   Problem: Skin Integrity: Goal: Risk for impaired skin integrity will decrease Outcome: Progressing

## 2019-10-27 ENCOUNTER — Inpatient Hospital Stay (HOSPITAL_COMMUNITY): Payer: Medicare Other

## 2019-10-27 DIAGNOSIS — U071 COVID-19: Secondary | ICD-10-CM

## 2019-10-27 DIAGNOSIS — R791 Abnormal coagulation profile: Secondary | ICD-10-CM

## 2019-10-27 DIAGNOSIS — I82401 Acute embolism and thrombosis of unspecified deep veins of right lower extremity: Secondary | ICD-10-CM

## 2019-10-27 LAB — COMPREHENSIVE METABOLIC PANEL
ALT: 14 U/L (ref 0–44)
AST: 18 U/L (ref 15–41)
Albumin: 2.7 g/dL — ABNORMAL LOW (ref 3.5–5.0)
Alkaline Phosphatase: 31 U/L — ABNORMAL LOW (ref 38–126)
Anion gap: 10 (ref 5–15)
BUN: 18 mg/dL (ref 8–23)
CO2: 32 mmol/L (ref 22–32)
Calcium: 8.7 mg/dL — ABNORMAL LOW (ref 8.9–10.3)
Chloride: 95 mmol/L — ABNORMAL LOW (ref 98–111)
Creatinine, Ser: 0.69 mg/dL (ref 0.61–1.24)
GFR calc Af Amer: >60 mL/min (ref >60)
GFR calc non Af Amer: >60 mL/min (ref >60)
Glucose, Bld: 134 mg/dL — ABNORMAL HIGH (ref 70–99)
Potassium: 3.8 mmol/L (ref 3.5–5.1)
Sodium: 137 mmol/L (ref 135–145)
Total Bilirubin: 0.4 mg/dL (ref 0.3–1.2)
Total Protein: 5.2 g/dL — ABNORMAL LOW (ref 6.5–8.1)

## 2019-10-27 LAB — FERRITIN: Ferritin: 372 ng/mL — ABNORMAL HIGH (ref 24–336)

## 2019-10-27 LAB — C-REACTIVE PROTEIN: CRP: 3.1 mg/dL — ABNORMAL HIGH (ref <1.0)

## 2019-10-27 LAB — D-DIMER, QUANTITATIVE: D-Dimer, Quant: 0.77 ug/mL-FEU — ABNORMAL HIGH (ref 0.00–0.50)

## 2019-10-27 LAB — GLUCOSE, CAPILLARY: Glucose-Capillary: 158 mg/dL — ABNORMAL HIGH (ref 70–99)

## 2019-10-27 MED ORDER — ENOXAPARIN SODIUM 40 MG/0.4ML ~~LOC~~ SOLN
40.0000 mg | SUBCUTANEOUS | Status: DC
  Administered 2019-10-27: 40 mg via SUBCUTANEOUS
  Filled 2019-10-27: qty 0.4

## 2019-10-27 MED ORDER — ENOXAPARIN SODIUM 80 MG/0.8ML ~~LOC~~ SOLN
70.0000 mg | Freq: Two times a day (BID) | SUBCUTANEOUS | Status: DC
  Administered 2019-10-28: 70 mg via SUBCUTANEOUS
  Filled 2019-10-27: qty 0.8

## 2019-10-27 MED ORDER — ENOXAPARIN SODIUM 30 MG/0.3ML ~~LOC~~ SOLN
30.0000 mg | SUBCUTANEOUS | Status: AC
  Administered 2019-10-27: 30 mg via SUBCUTANEOUS
  Filled 2019-10-27: qty 0.3

## 2019-10-27 NOTE — Progress Notes (Signed)
Bilateral lower extremity venous duplex has been completed. Preliminary results can be found in CV Proc through chart review.  Results were given to the patient's nurse, Elmyra Ricks.  10/27/19 10:55 AM Carlos Levering RVT

## 2019-10-27 NOTE — NC FL2 (Signed)
[place] LEVEL OF CARE SCREENING TOOL     IDENTIFICATION  Patient Name: Warren [name] Birthdate: 1922-07-22 Sex: male Admission Date (Current Location): 10/24/2019  [facility] and [place] Number:  Herbalist and Address:  The [place], [place] [address] [place], [place] [address]      Provider Number: [id number]  Attending Physician Name and Address:  Tawni Millers,*  Relative Name and Phone Number:  Son Talha Quinley [phone]    Current Level of Care: SNF Recommended Level of Care: [place] Prior Approval Number:    Date Approved/Denied:   PASRR Number: [id number]  Discharge Plan: SNF    Current Diagnoses: Patient Active Problem List   Diagnosis Date Noted  . DNR (do not resuscitate) 10/25/2019  . Acute respiratory failure with hypoxia ([place]) 10/24/2019  . Pneumonia due to COVID-19 virus 10/24/2019  . Barrett's esophagus 04/03/2018  . Candida esophagitis ([place]) 04/03/2018  . Anxiety 04/03/2018  . Esophageal dysfunction 03/23/2018  . Urinary tract infection associated with catheterization of urinary tract, initial encounter ([place]) 03/23/2018  . Leukocytosis 03/23/2018  . Dementia ([place]) 03/23/2018  . Dehydration   . Acute metabolic encephalopathy   . Nausea and vomiting 03/22/2018  . Pseudomonas urinary tract infection 03/06/2018  . Depression 03/06/2018  . Anxiety and depression 03/06/2018  . Osteoporosis 03/06/2018  . Acute lower UTI 03/01/2018  . Internal device, implant, or graft infection or inflammation 03/01/2018  . Emphysema   . Thyroid nodule   . Carotid artery disease ([place])   . CAD (coronary artery disease)   . Edema extremities   . Claudication ([place]) 07/30/2011  . Dyslipidemia 07/03/2010  . HYPERTENSION, BENIGN 07/03/2010  . PERIPHERAL EDEMA 07/03/2010  . SHORTNESS OF BREATH 07/03/2010  . PRECORDIAL PAIN 07/03/2010    Orientation RESPIRATION BLADDER Height &  Weight     Self  Normal, O2 External catheter(external catheter) Weight: 71.3 kg Height:  5\' 6"  (167.6 cm)  BEHAVIORAL SYMPTOMS/MOOD NEUROLOGICAL BOWEL NUTRITION STATUS      Incontinent Diet(regular)  AMBULATORY STATUS COMMUNICATION OF NEEDS Skin   Limited Assist Verbally Normal(normal per documentation)                       Personal Care Assistance Level of Assistance  Bathing, Feeding, Dressing Bathing Assistance: Limited assistance Feeding assistance: Limited assistance Dressing Assistance: Limited assistance     Functional Limitations Info  Hearing(impaired hearing)   Hearing Info: Impaired      SPECIAL CARE FACTORS FREQUENCY  PT (By licensed PT), OT (By licensed OT)     PT Frequency: PT x 5 times a week OT Frequency: )T x 5 times a week            Contractures Contractures Info: Not present    Additional Factors Info  Code Status, Allergies, Isolation Precautions Code Status Info: DNR Allergies Info: Cephalexin, latex, Penicillins.  Aspirin pt has adverse reaction to     Isolation Precautions Info: COVID positive     Current Medications (10/27/2019):  This is the current hospital active medication list Current Facility-Administered Medications  Medication Dose Route Frequency Provider Last Rate Last Admin  . acetaminophen (TYLENOL) tablet 650 mg  650 mg Oral Q6H PRN Rise Patience, MD       Or  . acetaminophen (TYLENOL) suppository 650 mg  650 mg Rectal Q6H PRN Rise Patience, MD      . albuterol (VENTOLIN HFA) 108 (  90 Base) MCG/ACT inhaler 1 puff  1 puff Inhalation Q6H Regalado, Belkys A, MD   1 puff at 10/27/19 0800  . ascorbic acid (VITAMIN C) tablet 500 mg  500 mg Oral Daily Rise Patience, MD   500 mg at 10/27/19 [id number]  . calcium carbonate (OS-CAL - dosed in mg of elemental calcium) tablet 500 mg of elemental calcium  1 tablet Oral BID Rise Patience, MD   500 mg of elemental calcium at 10/27/19 0958  .  chlorpheniramine-HYDROcodone (TUSSIONEX) 10-8 MG/5ML suspension 5 mL  5 mL Oral Q12H Arrien, Jimmy Picket, MD   5 mL at 10/27/19 0958  . citalopram (CELEXA) tablet 20 mg  20 mg Oral Daily Rise Patience, MD   20 mg at 10/27/19 [id number]  . dexamethasone (DECADRON) injection 6 mg  6 mg Intravenous Q24H Rise Patience, MD   6 mg at 10/26/19 1400  . enoxaparin (LOVENOX) injection 70 mg  70 mg Subcutaneous Q12H Arrien, Jimmy Picket, MD      . fluticasone [facility]) 50 MCG/ACT nasal spray 1 spray  1 spray Each Nare BID Arrien, Jimmy Picket, MD   1 spray at 10/27/19 [phone]  . guaiFENesin-dextromethorphan (ROBITUSSIN DM) 100-10 MG/5ML syrup 5 mL  5 mL Oral Q4H PRN Arrien, Jimmy Picket, MD      . LORazepam (ATIVAN) tablet 0.25-0.5 mg  0.25-0.5 mg Oral QHS PRN Rise Patience, MD      . menthol-cetylpyridinium (CEPACOL) lozenge 3 mg  1 lozenge Oral QID Tawni Millers, MD   3 mg at 10/27/19 0959  . mirabegron ER (MYRBETRIQ) tablet 25 mg  25 mg Oral Daily Rise Patience, MD   25 mg at 10/27/19 [id number]  . ondansetron (ZOFRAN) tablet 4 mg  4 mg Oral Q6H PRN Rise Patience, MD       Or  . ondansetron [facility]) injection 4 mg  4 mg Intravenous Q6H PRN Rise Patience, MD      . pantoprazole (PROTONIX) EC tablet 40 mg  40 mg Oral BID Rise Patience, MD   40 mg at 10/27/19 [id number]  . pravastatin (PRAVACHOL) tablet 40 mg  40 mg Oral QHS Rise Patience, MD   40 mg at 10/26/19 2229  . remdesivir 100 mg in sodium chloride 0.9 % 100 mL IVPB  100 mg Intravenous Daily Rise Patience, MD 200 mL/hr at 10/27/19 1004 100 mg at 10/27/19 1004  . tamsulosin (FLOMAX) capsule 0.4 mg  0.4 mg Oral Daily Rise Patience, MD   0.4 mg at 10/27/19 [id number]  . zinc sulfate capsule 220 mg  220 mg Oral BID Rise Patience, MD   220 mg at 10/27/19 [id number]     Discharge Medications: Please see discharge summary for a list of discharge medications.  Relevant Imaging  Results:  Relevant Lab Results:   Additional Information Pt social security number is [id number]  Aidon Klemens, Abelino Derrick, RN

## 2019-10-27 NOTE — Care Management (Addendum)
CM reached out to [facility] where pt resides - facility confirms pt will be accepted back with COVID  Per son pt has been a resident for a couple of years.  Plan is for pt to return to facility if they accept pt back.  Pts baseline is wheelchair bound.

## 2019-10-27 NOTE — TOC Initial Note (Addendum)
Transition of Care [facility]) - Initial/Assessment Note    Patient Details  Name: Warren [name] MRN: [id number] Date of Birth: [birth year]  [facility] of [facility]) CM/SW Contact:    Maryclare Labrador, RN Phone Number: 10/27/2019, 12:25 PM  Clinical Narrative:    CM unable to reach pt via phone.  CM reached out to pts [name].  Tim informed CM that pt has been a resident at facility for a couple of years.  Plan is for pt to return to facility.  CM confirmed with facility that they will accept pt back covid positive.  CM will complete FL2 and fax pt out to [facility].            Update:  CM informed RN supervisor at facility Mickel Baas that pt will likely discharge back tomorrow per attending.  Mickel Baas requested that CM contact her once discharge orders are written at [phone].   Expected Discharge Plan: [place]     Patient Goals and CMS Choice        Expected Discharge Plan and Services Expected Discharge Plan: [place] arrangements for the past 2 months: [place]                                      Prior Living Arrangements/Services Living arrangements for the past 2 months: [place]   Patient language and need for interpreter reviewed:: Yes        Need for Family Participation in Patient Care: Yes (Comment) Care giver support system in place?: Yes (comment)   Criminal Activity/Legal Involvement Pertinent to Current Situation/Hospitalization: No - Comment as needed  Activities of Daily Living      Permission Sought/Granted   Permission granted to share information with : Yes, Verbal Permission Granted(son provided verbal permission to contact facility where pt resides)     Permission granted to share info w AGENCY: [facility]        Emotional Assessment              Admission diagnosis:  Hypoxemia [R09.02] Hypokalemia [E87.6] Acute respiratory failure  with hypoxia ([place]) [J96.01] Pneumonia due to COVID-19 virus [U07.1, J12.89] Patient Active Problem List   Diagnosis Date Noted  . DNR (do not resuscitate) 10/25/2019  . Acute respiratory failure with hypoxia ([place]) 10/24/2019  . Pneumonia due to COVID-19 virus 10/24/2019  . Barrett's esophagus 04/03/2018  . Candida esophagitis ([place]) 04/03/2018  . Anxiety 04/03/2018  . Esophageal dysfunction 03/23/2018  . Urinary tract infection associated with catheterization of urinary tract, initial encounter ([place]) 03/23/2018  . Leukocytosis 03/23/2018  . Dementia ([place]) 03/23/2018  . Dehydration   . Acute metabolic encephalopathy   . Nausea and vomiting 03/22/2018  . Pseudomonas urinary tract infection 03/06/2018  . Depression 03/06/2018  . Anxiety and depression 03/06/2018  . Osteoporosis 03/06/2018  . Acute lower UTI 03/01/2018  . Internal device, implant, or graft infection or inflammation 03/01/2018  . Emphysema   . Thyroid nodule   . Carotid artery disease ([place])   . CAD (coronary artery disease)   . Edema extremities   . Claudication ([place]) 07/30/2011  . Dyslipidemia 07/03/2010  . HYPERTENSION, BENIGN 07/03/2010  . PERIPHERAL EDEMA 07/03/2010  . SHORTNESS OF BREATH 07/03/2010  . PRECORDIAL PAIN 07/03/2010   PCP:  [name], MD Pharmacy:  No Pharmacies Listed  Social Determinants of Health (SDOH) Interventions    Readmission Risk Interventions No flowsheet data found.

## 2019-10-27 NOTE — Progress Notes (Signed)
ANTICOAGULATION CONSULT NOTE - Initial Consult  Pharmacy Consult for Lovenox Indication: DVT  Allergies  Allergen Reactions  . Aspirin Other (See Comments)    Reaction - spots  . Cephalexin Other (See Comments)    Reported by [facility] 08/19/2018  . Latex Other (See Comments)    Unknown reaction  . Penicillins     Has patient had a PCN reaction causing immediate rash, facial/tongue/throat swelling, SOB or lightheadedness with hypotension:No Has patient had a PCN reaction causing severe rash involving mucus membranes or skin necrosis: No Has patient had a PCN reaction that required hospitalization:NoHas patient had a PCN reaction occurring within the last 10 years:No If all of the above answers are "NO", then may proceed with Cephalosporin use.     Patient Measurements: Height: 5\' 6"  (167.6 cm) Weight: 157 lb 3 oz (71.3 kg) IBW/kg (Calculated) : 63.8  Vital Signs: Temp: 97.5 F (36.4 C) (12/24 0706) Temp Source: Axillary (12/24 0706) BP: 138/71 (12/24 0800) Pulse Rate: 65 (12/24 0800)  Labs: Recent Labs    10/24/19 1818 10/24/19 2357 10/25/19 0700 10/26/19 1206 10/27/19 0405  HGB 14.5 13.3 13.5  --   --   HCT 43.0 39.9 39.9  --   --   PLT 160 154 152  --   --   CREATININE 0.81 0.74 0.78 0.81 0.69    Estimated Creatinine Clearance: 49.8 mL/min (by C-G formula based on SCr of 0.69 mg/dL).   Medical History: Past Medical History:  Diagnosis Date  . CAD (coronary artery disease)    moderate by catheterization,11/08...essentially unchanged by repeat study,10/11  . Carotid artery disease (HCC)     less than 50% stenosis bilaterally March 2013   . Claudication ([place])     ABIs within ABIs within normal limits, 40% left iliac disease by catheterization   . Colon polyps   . Depression   . Dizzy   . Edema extremities     secondary to severe varicose veins. Nonpitting edema and dermatitis.   . Emphysema   . Glaucoma   . Hyperlipidemia   . IBS (irritable  bowel syndrome)   . Mitral regurgitation    mild  . Mixed dyslipidemia   . Peripheral edema   . Pneumonia   . Prostate cancer [facility])    s/p radiation treatment/s/p TURP  . Shortness of breath    Emphysema  . Thyroid nodule     posterior and inferior to the right lobepossibly outside of the gland secondary to parathyroid adenoma in addition multiple bilateral thyroid nodules     Medications:  Scheduled:  . albuterol  1 puff Inhalation Q6H  . ascorbic acid  500 mg Oral Daily  . calcium carbonate  1 tablet Oral BID  . chlorpheniramine-HYDROcodone  5 mL Oral Q12H  . citalopram  20 mg Oral Daily  . dexamethasone (DECADRON) injection  6 mg Intravenous Q24H  . enoxaparin (LOVENOX) injection  40 mg Subcutaneous Q24H  . fluticasone  1 spray Each Nare BID  . menthol-cetylpyridinium  1 lozenge Oral QID  . mirabegron ER  25 mg Oral Daily  . pantoprazole  40 mg Oral BID  . pravastatin  40 mg Oral QHS  . tamsulosin  0.4 mg Oral Daily  . zinc sulfate  220 mg Oral BID   Infusions:  . remdesivir 100 mg in NS 100 mL 100 mg (10/27/19 1004)    Assessment: 85 y/o male admitted with COVID-19 pneumonia. PMH significant for advanced dementia, CAD, depression, and  prostate cancer. Pharmacy has been consulted to dose Lovenox for DVT in right leg.   This patient was receiving Lovenox 40 mg SQ daily for DVT prophylaxis which was discontinued yesterday for an undetermined reason. His last dose yesterday 12/23 was given at 0905. This morning I contacted the attending physician recommending to restart Lovenox 40 mg SQ daily for DVT prophylaxis if there were no contraindications which he agreed to. It was restarted at 1030, and the patient received the dose at 1109.   Last Hgb and Plt 12/22 were WNL. Scr stable at 0.69. Estimated CrCl 49.8 mL/min.   Goal of Therapy:  Anti-Xa level 0.6-1 units/ml 4hrs after LMWH dose given Monitor platelets by anticoagulation protocol: Yes   Plan:  - Give Lovenox 30 mg  SQ x1 STAT to equal 70 mg with dose given at 1109 - Order Lovenox 70 mg SQ twice daily thereafter based on 1 mg/kg twice daily dosing - Will order CBC for tomorrow to ensure it is still stable - Monitor renal function, CBC, s/sx of bleeding  Agnes Lawrence, PharmD PGY1 Pharmacy Resident

## 2019-10-27 NOTE — Progress Notes (Signed)
PROGRESS NOTE    Warren [name]  [id number] DOB: 12/04/1921 DOA: 10/24/2019 PCP: [name], MD    Brief Narrative:  85 year old male who presented with dyspnea.  He does have significant past medical history for advanced dementia, coronary artery disease, depression and prostate cancer.  Patient is a nursing home resident, at his facility there was a Covid outbreak and he was routinely screened.  He tested positive for COVID-19 December 16.  For the last 2 days prior to hospitalization patient became short of breath, he had generalized weakness and cough.  Positive oxygen desaturation.  On his initial physical examination his blood pressure was 122/73, pulse rate 73, respiratory 24, oxygen saturation 95%, his lungs had no rhonchi or rales, heart S1-S2 present rhythmic, abdomen soft, no lower extremity edema.  Chest radiograph, left rotation, positive left lower lobe interstitial infiltrate, right base atelectasis, (personally reviewed).  EKG 77 bpm, left axis deviation, sinus rhythm, no ST segment or T wave changes.  Patient was admitted to the hospital with a working diagnosis of acute hypoxic respiratory failure due to SARS COVID-19 viral pneumonia.  Patient has been responding well to medical therapy with remdesivir and dexamethasone.  Lower extremity US positive for DVT right gastrocnemius veins. No DVT on the left.   Assessment & Plan:   Principal Problem:   Acute respiratory failure with hypoxia (HCC) Active Problems:   CAD (coronary artery disease)   Dementia ([place])   Pneumonia due to COVID-19 virus   DNR (do not resuscitate)    1.  Acute hypoxic respiratory failure due to SARS COVID-19 viral pneumonia.  RR: 20  Pulse oxymetry: 97%  Fi02: 2 L/ min per [place]   COVID-19 Labs  Recent Labs    10/24/19 1818 10/25/19 0700 10/26/19 1206 10/27/19 0405  DDIMER 0.98* 0.71* 1.62* 0.77*  FERRITIN 294 335 465* 372*  LDH 205*  --   --   --   CRP 12.2* 10.9* 5.9*  3.1*    Lab Results  Component Value Date   SARSCOV2NAA POSITIVE (A) 10/26/2019    Inflammatory markers continue to trend down.  Tolerating well medical therapy with Remdesivir # 4/5, continue with systemic corticosteroids (dexamethasone), on antitussive agents, bronchodilators and airway clearing techniques with incentive spirometer and flutter valve.   Continue with flonase for nasal congestion and lozanges for sore throat.   New DVT right gastrocnemius. Will start anticoagulation with enoxaparin, plan to transition to oral agents at discharge if good toleration.   Patient will need SNF at discharge.   2. Dyslipidemia. On pravachol  3. Depression/ decreased hearing. On celexa. Improved communication today by writing.   4. BPH. No urinary retention, on flomax.   5. Hypokalemia/ with contraction metabolic alkalosis.  Stable renal function with serum cr at 0,69, K at 3,8 and serum bicarbonate at 32. Will continue to follow on renal panel in am.  DVT prophylaxis: enoxaparin   Code Status: dnr  Family Communication: no family at the bedside  Disposition Plan/ discharge barriers: pending clinical improvement.    Subjective: Patient continue to feel better but not yet back to baseline, continue to have sore throat, no nausea or vomiting, dyspnea is improving, no chest pain. Has been tolerating po well.   Objective: Vitals:   10/26/19 2110 10/26/19 2115 10/27/19 0706 10/27/19 0800  BP:  137/80 (!) 104/40 138/71  Pulse: 68   65  Resp: 20     Temp:  (!) 97.5 F (36.4 C) (!) 97.5 F (36.4  C)   TempSrc:  Oral Axillary   SpO2: 96%   97%  Weight:        Intake/Output Summary (Last 24 hours) at 10/27/2019 0818 Last data filed at 10/27/2019 0800 Gross per 24 hour  Intake 250 ml  Output 375 ml  Net -125 ml   Filed Weights   10/25/19 0300  Weight: 71.3 kg    Examination:   General: Not in pain or dyspnea, deconditioned  Neurology: Awake and alert, non focal    E ENT: mild pallor, no icterus, oral mucosa moist Cardiovascular: No JVD. S1-S2 present, rhythmic, no gallops, rubs, or murmurs.  Asymmetric non pitting right lower extremity edema. Pulmonary: positive breath sounds bilaterally. Gastrointestinal. Abdomen with no organomegaly, non tender, no rebound or guarding Skin. No rashes Musculoskeletal: no joint deformities     Data Reviewed: I have personally reviewed following labs and imaging studies  CBC: Recent Labs  Lab 10/24/19 1818 10/24/19 2357 10/25/19 0700  WBC 5.6 7.2 4.6  NEUTROABS 4.8  --  4.1  HGB 14.5 13.3 13.5  HCT 43.0 39.9 39.9  MCV 89.6 89.7 89.3  PLT 160 154 0000000   Basic Metabolic Panel: Recent Labs  Lab 10/24/19 1818 10/24/19 2357 10/25/19 0700 10/26/19 1206 10/27/19 0405  NA 134*  --  137 138 137  K 2.9*  --  2.9* 4.2 3.8  CL 93*  --  94* 96* 95*  CO2 31  --  30 33* 32  GLUCOSE 115*  --  164* 139* 134*  BUN 10  --  13 17 18   CREATININE 0.81 0.74 0.78 0.81 0.69  CALCIUM 8.4*  --  8.6* 8.9 8.7*  MG  --   --  1.9  --   --    GFR: CrCl cannot be calculated (Unknown ideal weight.). Liver Function Tests: Recent Labs  Lab 10/24/19 1818 10/25/19 0700 10/26/19 1206 10/27/19 0405  AST 20 17 23 18   ALT 14 13 16 14   ALKPHOS 33* 31* 36* 31*  BILITOT 0.9 0.7 0.5 0.4  PROT 5.5* 5.5* 6.0* 5.2*  ALBUMIN 3.1* 2.7* 3.0* 2.7*   No results for input(s): LIPASE, AMYLASE in the last 168 hours. No results for input(s): AMMONIA in the last 168 hours. Coagulation Profile: No results for input(s): INR, PROTIME in the last 168 hours. Cardiac Enzymes: No results for input(s): CKTOTAL, CKMB, CKMBINDEX, TROPONINI in the last 168 hours. BNP (last 3 results) No results for input(s): PROBNP in the last 8760 hours. HbA1C: No results for input(s): HGBA1C in the last 72 hours. CBG: No results for input(s): GLUCAP in the last 168 hours. Lipid Profile: Recent Labs    10/24/19 1818  TRIG 64   Thyroid Function  Tests: No results for input(s): TSH, T4TOTAL, FREET4, T3FREE, THYROIDAB in the last 72 hours. Anemia Panel: Recent Labs    10/26/19 1206 10/27/19 0405  FERRITIN 465* 372*      Radiology Studies: I have reviewed all of the imaging during this hospital visit personally     Scheduled Meds: . albuterol  1 puff Inhalation Q6H  . ascorbic acid  500 mg Oral Daily  . calcium carbonate  1 tablet Oral BID  . chlorpheniramine-HYDROcodone  5 mL Oral Q12H  . citalopram  20 mg Oral Daily  . dexamethasone (DECADRON) injection  6 mg Intravenous Q24H  . fluticasone  1 spray Each Nare BID  . menthol-cetylpyridinium  1 lozenge Oral QID  . mirabegron ER  25 mg Oral Daily  .  pantoprazole  40 mg Oral BID  . pravastatin  40 mg Oral QHS  . tamsulosin  0.4 mg Oral Daily  . zinc sulfate  220 mg Oral BID   Continuous Infusions: . remdesivir 100 mg in NS 100 mL 100 mg (10/26/19 0913)     LOS: 3 days        Yoselin Amerman Gerome Apley, MD

## 2019-10-27 NOTE — Plan of Care (Signed)
  Problem: Education: Goal: Knowledge of risk factors and measures for prevention of condition will improve Outcome: Progressing   Problem: Coping: Goal: Psychosocial and spiritual needs will be supported Outcome: Progressing   Problem: Respiratory: Goal: Will maintain a patent airway Outcome: Progressing Goal: Complications related to the disease process, condition or treatment will be avoided or minimized Outcome: Progressing   Problem: Education: Goal: Knowledge of General Education information will improve Description: Including pain rating scale, medication(s)/side effects and non-pharmacologic comfort measures Outcome: Progressing   Problem: Clinical Measurements: Goal: Will remain free from infection Outcome: Progressing Goal: Diagnostic test results will improve Outcome: Progressing Goal: Respiratory complications will improve Outcome: Progressing   Problem: Activity: Goal: Risk for activity intolerance will decrease Outcome: Progressing   Problem: Nutrition: Goal: Adequate nutrition will be maintained Outcome: Progressing   Problem: Coping: Goal: Level of anxiety will decrease Outcome: Progressing   Problem: Pain Managment: Goal: General experience of comfort will improve Outcome: Progressing   Problem: Safety: Goal: Ability to remain free from injury will improve Outcome: Progressing   Problem: Skin Integrity: Goal: Risk for impaired skin integrity will decrease Outcome: Progressing

## 2019-10-28 DIAGNOSIS — E785 Hyperlipidemia, unspecified: Secondary | ICD-10-CM

## 2019-10-28 DIAGNOSIS — I779 Disorder of arteries and arterioles, unspecified: Secondary | ICD-10-CM

## 2019-10-28 LAB — COMPREHENSIVE METABOLIC PANEL
ALT: 18 U/L (ref 0–44)
AST: 24 U/L (ref 15–41)
Albumin: 2.9 g/dL — ABNORMAL LOW (ref 3.5–5.0)
Alkaline Phosphatase: 32 U/L — ABNORMAL LOW (ref 38–126)
Anion gap: 12 (ref 5–15)
BUN: 18 mg/dL (ref 8–23)
CO2: 33 mmol/L — ABNORMAL HIGH (ref 22–32)
Calcium: 8.8 mg/dL — ABNORMAL LOW (ref 8.9–10.3)
Chloride: 94 mmol/L — ABNORMAL LOW (ref 98–111)
Creatinine, Ser: 0.85 mg/dL (ref 0.61–1.24)
GFR calc Af Amer: >60 mL/min (ref >60)
GFR calc non Af Amer: >60 mL/min (ref >60)
Glucose, Bld: 145 mg/dL — ABNORMAL HIGH (ref 70–99)
Potassium: 4 mmol/L (ref 3.5–5.1)
Sodium: 139 mmol/L (ref 135–145)
Total Bilirubin: 0.4 mg/dL (ref 0.3–1.2)
Total Protein: 5.4 g/dL — ABNORMAL LOW (ref 6.5–8.1)

## 2019-10-28 LAB — CBC
HCT: 44.7 % (ref 39.0–52.0)
Hemoglobin: 14.9 g/dL (ref 13.0–17.0)
MCH: 30.2 pg (ref 26.0–34.0)
MCHC: 33.3 g/dL (ref 30.0–36.0)
MCV: 90.5 fL (ref 80.0–100.0)
Platelets: 250 10*3/uL (ref 150–400)
RBC: 4.94 MIL/uL (ref 4.22–5.81)
RDW: 13.2 % (ref 11.5–15.5)
WBC: 9.8 10*3/uL (ref 4.0–10.5)
nRBC: 0 % (ref 0.0–0.2)

## 2019-10-28 LAB — D-DIMER, QUANTITATIVE: D-Dimer, Quant: 0.64 ug/mL-FEU — ABNORMAL HIGH (ref 0.00–0.50)

## 2019-10-28 LAB — C-REACTIVE PROTEIN: CRP: 2.4 mg/dL — ABNORMAL HIGH (ref <1.0)

## 2019-10-28 LAB — FERRITIN: Ferritin: 307 ng/mL (ref 24–336)

## 2019-10-28 MED ORDER — RIVAROXABAN 15 MG PO TABS
15.0000 mg | ORAL_TABLET | Freq: Two times a day (BID) | ORAL | Status: DC
  Administered 2019-10-28: 15 mg via ORAL
  Filled 2019-10-28: qty 1

## 2019-10-28 MED ORDER — FLUTICASONE PROPIONATE 50 MCG/ACT NA SUSP: 1.0000 | Freq: Two times a day (BID) | NASAL | 0 refills | Status: AC

## 2019-10-28 MED ORDER — GUAIFENESIN-DM 100-10 MG/5ML PO SYRP: 5.0000 mL | ORAL_SOLUTION | ORAL | 0 refills | Status: DC | PRN

## 2019-10-28 MED ORDER — MENTHOL 3 MG MT LOZG: 1.0000 | LOZENGE | OROMUCOSAL | 12 refills | Status: DC | PRN

## 2019-10-28 MED ORDER — RIVAROXABAN (XARELTO) VTE STARTER PACK (15 & 20 MG): ORAL_TABLET | ORAL | 0 refills | Status: DC

## 2019-10-28 MED ORDER — RIVAROXABAN 20 MG PO TABS: 20.0000 mg | ORAL_TABLET | Freq: Every day | ORAL | Status: DC

## 2019-10-28 MED ORDER — LORAZEPAM 0.5 MG PO TABS: 0.2500 mg | ORAL_TABLET | Freq: Every evening | ORAL | 0 refills | Status: DC | PRN

## 2019-10-28 MED ORDER — ONDANSETRON HCL 4 MG PO TABS: 4.0000 mg | ORAL_TABLET | Freq: Four times a day (QID) | ORAL | 0 refills | Status: DC | PRN

## 2019-10-28 NOTE — Progress Notes (Signed)
ANTICOAGULATION CONSULT NOTE - Initial Consult  Pharmacy Consult for rivaroxaban (Xarelto) Indication: DVT in the setting of COVID-19  Allergies  Allergen Reactions  . Aspirin Other (See Comments)    Reaction - spots  . Cephalexin Other (See Comments)    Reported by [facility] 08/19/2018  . Latex Other (See Comments)    Unknown reaction  . Penicillins     Has patient had a PCN reaction causing immediate rash, facial/tongue/throat swelling, SOB or lightheadedness with hypotension:No Has patient had a PCN reaction causing severe rash involving mucus membranes or skin necrosis: No Has patient had a PCN reaction that required hospitalization:NoHas patient had a PCN reaction occurring within the last 10 years:No If all of the above answers are "NO", then may proceed with Cephalosporin use.     Patient Measurements: Height: 5\' 6"  (167.6 cm) Weight: 157 lb 3 oz (71.3 kg) IBW/kg (Calculated) : 63.8  Vital Signs: Temp: 98.3 F (36.8 C) (12/25 0500) Temp Source: Oral (12/25 0500) BP: 131/73 (12/25 0500) Pulse Rate: 68 (12/25 0500)  Labs: Recent Labs    10/26/19 1206 10/27/19 0405 10/28/19 0313  HGB  --   --  14.9  HCT  --   --  44.7  PLT  --   --  250  CREATININE 0.81 0.69 0.85    Estimated Creatinine Clearance: 46.9 mL/min (by C-G formula based on SCr of 0.85 mg/dL).   Medical History: Past Medical History:  Diagnosis Date  . CAD (coronary artery disease)    moderate by catheterization,11/08...essentially unchanged by repeat study,10/11  . Carotid artery disease (HCC)     less than 50% stenosis bilaterally March 2013   . Claudication ([place])     ABIs within ABIs within normal limits, 40% left iliac disease by catheterization   . Colon polyps   . Depression   . Dizzy   . Edema extremities     secondary to severe varicose veins. Nonpitting edema and dermatitis.   . Emphysema   . Glaucoma   . Hyperlipidemia   . IBS (irritable bowel syndrome)   . Mitral  regurgitation    mild  . Mixed dyslipidemia   . Peripheral edema   . Pneumonia   . Prostate cancer [facility])    s/p radiation treatment/s/p TURP  . Shortness of breath    Emphysema  . Thyroid nodule     posterior and inferior to the right lobepossibly outside of the gland secondary to parathyroid adenoma in addition multiple bilateral thyroid nodules     Medications:  Scheduled:  . albuterol  1 puff Inhalation Q6H  . ascorbic acid  500 mg Oral Daily  . calcium carbonate  1 tablet Oral BID  . chlorpheniramine-HYDROcodone  5 mL Oral Q12H  . citalopram  20 mg Oral Daily  . dexamethasone (DECADRON) injection  6 mg Intravenous Q24H  . fluticasone  1 spray Each Nare BID  . menthol-cetylpyridinium  1 lozenge Oral QID  . mirabegron ER  25 mg Oral Daily  . pantoprazole  40 mg Oral BID  . pravastatin  40 mg Oral QHS  . rivaroxaban  15 mg Oral BID   Followed by  . [START ON 11/18/2019] rivaroxaban  20 mg Oral Q supper  . tamsulosin  0.4 mg Oral Daily  . zinc sulfate  220 mg Oral BID   Infusions:  . remdesivir 100 mg in NS 100 mL 100 mg (10/28/19 0959)    Assessment: 85 y/o male admitted with COVID-19 pneumonia.  PMH significant for advanced dementia, CAD, depression, and prostate cancer. Pharmacy has been consulted to dose rivaroxaban (Xarelto) for DVT in right leg.   This patient was receiving Lovenox 40 mg SQ daily for DVT prophylaxis which was discontinued 12/23 for an undetermined reason. Patient was restarted on Lovenox 70 mg SQ BID for active DVT on 12/24.  Today (12/25), transitioning from lovenox to rivaroxaban.  Hgb and Plt are WNL. Scr stable at 0.85. Estimated CrCl 46.9 mL/min.   Goal of Therapy:  Monitor platelets by anticoagulation protocol: Yes   Plan:  - Give rivaroxaban 15 mg PO BID for 21 days then rivaroxaban 20 mg PO daily with supper - Monitor renal function, CBC, s/sx of bleeding  Acey Lav, PharmD  PGY1 Acute Care Pharmacy Resident [id number]

## 2019-10-28 NOTE — Discharge Summary (Signed)
Physician Discharge Summary  Warren [name] [id number] DOB: [birth year] DOA: 10/24/2019  PCP: [name], MD  Admit date: 10/24/2019 Discharge date: 10/28/2019  Admitted From: SNF  Disposition:  SNF   Recommendations for Outpatient Follow-up and new medication changes:  1. Follow up with Dr. Sherrie Sport in 2 weeks.  2. Patient has been placed on rivaroxaban for right lower extremity deep vein thrombosis.   Home Health: na   Equipment/Devices: na    Discharge Condition: stable  CODE STATUS: DNR   Diet recommendation: heart healthy   Brief/Interim Summary: 85 year old male who presented with dyspnea. He does have significant past medical history for advanced dementia, coronary artery disease, depression, hypoacusia and prostate cancer. Patient is a nursing home resident, at his facility there was a Covid outbreak and he was routinely screened. He tested positive for COVID-19 December 16. For the last 2 days prior to hospitalization patient became short of breath, he had generalized weakness and cough. Positive oxygen desaturation. On his initial physical examination his blood pressure was 122/73, pulse rate 73, respiratory rate 24, oxygen saturation 95% on supplemental 02, his lungs had no rhonchi or rales, heart S1-S2 present rhythmic, abdomen soft, no lower extremity edema.  Sodium 134, potassium 2.9, chloride 93, bicarb 31, glucose 115, BUN 10, creatinine 0.8, white count 5.6, hemoglobin 14.5, hematocrit 43.0, platelets 160. SARS COVID-19 positive. His chest radiograph had left rotation, positive left lower lobe interstitial infiltrate, right base atelectasis, (personally reviewed). EKG 77 bpm, left axis deviation, sinus rhythm, no ST segment or T wave changes.  Patient was admitted to the hospital with a working diagnosis of acute hypoxic respiratory failure due to SARS COVID-19 viral pneumonia.  Patient has been responding well to medical therapy with remdesivir and  dexamethasone.  Lower extremity US positive for DVT right gastrocnemius veins. No DVT on the left  1.  Acute hypoxic respiratory failure due to SARS COVID-19 viral pneumonia.  Patient was admitted to the medical ward, he received supplemental oxygen per nasal cannula, medical therapy with remdesivir and systemic corticosteroids with dexamethasone.  He was treated with antitussive agents, bronchodilators, and airway clearance techniques with incentive spirometer and flutter valve.  For nasal congestion he received nasal fluticasone.  He responded well to medical therapy with improvement of his symptoms and inflammatory markers.Marland Kitchen  COVID-19 Labs  Recent Labs    10/26/19 1206 10/27/19 0405 10/28/19 0313  DDIMER 1.62* 0.77* 0.64*  FERRITIN 465* 372* 307  CRP 5.9* 3.1* 2.4*    Lab Results  Component Value Date   SARSCOV2NAA POSITIVE (A) 10/26/2019   His oximetry at discharge is 94% on room air.   2.  New right lower extremity deep vein thrombosis.  Further work-up with lower extremity ultrasonography revealed acute deep vein thrombosis involving the right gastrocnemius veins.  No deep vein thrombosis in the lower extremity.  Patient tolerated anticoagulation well, at discharge will take rivaroxaban.  Follow-up as an outpatient.  3.  Hypokalemia with metabolic alkalosis.  Patient received supportive medical therapy, and electrolytes were corrected.  At discharge potassium was 4.0, bicarb 33 and creatinine 0.85  4.  Dyslipidemia.  Continue Pravachol.  5.  Depression/hyperacusis.  Continue Celexa.  No agitation.  6.  BPH.  No urinary retention, continue Flomax.  Discharge Diagnoses:  Principal Problem:   Acute respiratory failure with hypoxia ([place]) Active Problems:   CAD (coronary artery disease)   Dementia ([address])   Pneumonia due to COVID-19 virus   DNR (do not resuscitate)  Discharge Instructions   Allergies as of 10/28/2019      Reactions   Aspirin Other (See Comments)    Reaction - spots   Cephalexin Other (See Comments)   Reported by [facility] 08/19/2018   Latex Other (See Comments)   Unknown reaction   Penicillins    Has patient had a PCN reaction causing immediate rash, facial/tongue/throat swelling, SOB or lightheadedness with hypotension:No Has patient had a PCN reaction causing severe rash involving mucus membranes or skin necrosis: No Has patient had a PCN reaction that required hospitalization:NoHas patient had a PCN reaction occurring within the last 10 years:No If all of the above answers are "NO", then may proceed with Cephalosporin use.      Medication List    TAKE these medications   alendronate 70 MG tablet Commonly known as: FOSAMAX Take 70 mg by mouth every Wednesday. Take with a full glass of water on an empty stomach.   ascorbic acid 500 MG tablet Commonly known as: VITAMIN C Take 500 mg by mouth daily.   calcium carbonate 1250 (500 Ca) MG tablet Commonly known as: OS-CAL - dosed in mg of elemental calcium Take 1 tablet by mouth 2 (two) times daily.   Cholecalciferol 50 MCG (2000 UT) Tabs Take 2,000 Units by mouth daily.   citalopram 20 MG tablet Commonly known as: CELEXA Take 20 mg by mouth daily.   fluticasone 50 MCG/ACT nasal spray Commonly known as: FLONASE Place 1 spray into both nostrils 2 (two) times daily.   guaiFENesin 600 MG 12 hr tablet Commonly known as: MUCINEX Take 600 mg by mouth 2 (two) times daily.   guaiFENesin-dextromethorphan 100-10 MG/5ML syrup Commonly known as: ROBITUSSIN DM Take 5 mLs by mouth every 4 (four) hours as needed for cough.   LORazepam 0.5 MG tablet Commonly known as: ATIVAN Take 0.5-1 tablets (0.25-0.5 mg total) by mouth at bedtime as needed for anxiety or sleep. What changed:   when to take this  reasons to take this  additional instructions  Another medication with the same name was removed. Continue taking this medication, and follow the directions you see  here.   menthol-cetylpyridinium 3 MG lozenge Commonly known as: CEPACOL Take 1 lozenge (3 mg total) by mouth every 4 (four) hours as needed for sore throat.   Myrbetriq 25 MG Tb24 tablet Generic drug: mirabegron ER Take 25 mg by mouth daily.   NUTRITIONAL SUPPLEMENT PO Take 120 mLs by mouth 2 (two) times daily with a meal. MedPass 2.0   omeprazole 20 MG tablet Commonly known as: PRILOSEC OTC Take 20 mg by mouth 2 (two) times daily.   ondansetron 4 MG tablet Commonly known as: ZOFRAN Take 1 tablet (4 mg total) by mouth every 6 (six) hours as needed for nausea.   oxybutynin 5 MG tablet Commonly known as: DITROPAN Take 1 tablet (5 mg total) by mouth 2 (two) times daily.   pravastatin 40 MG tablet Commonly known as: PRAVACHOL Take 40 mg by mouth at bedtime.   Rivaroxaban 15 & 20 MG Tbpk Follow package directions: Take one 15mg  tablet by mouth twice a day. On day 22, switch to one 20mg  tablet once a day. Take with food.   sodium chloride 0.65 % Soln nasal spray Commonly known as: OCEAN Place 2 sprays into both nostrils 3 (three) times daily as needed for congestion.   tamsulosin 0.4 MG Caps capsule Commonly known as: FLOMAX Take 0.4 mg by mouth daily.   zinc sulfate 220 (  50 Zn) MG capsule Take 220 mg by mouth 2 (two) times daily.      Follow-up Information    [name], MD Follow up in 2 week(s).   Specialty: Internal Medicine Contact information: [place] [phone] [phone]          Allergies  Allergen Reactions  . Aspirin Other (See Comments)    Reaction - spots  . Cephalexin Other (See Comments)    Reported by [facility] 08/19/2018  . Latex Other (See Comments)    Unknown reaction  . Penicillins     Has patient had a PCN reaction causing immediate rash, facial/tongue/throat swelling, SOB or lightheadedness with hypotension:No Has patient had a PCN reaction causing severe rash involving mucus membranes or skin  necrosis: No Has patient had a PCN reaction that required hospitalization:NoHas patient had a PCN reaction occurring within the last 10 years:No If all of the above answers are "NO", then may proceed with Cephalosporin use.     Consultations:     Procedures/Studies: DG Chest Port 1 View  Result Date: 10/24/2019 CLINICAL DATA:  Shortness of breath. COVID-19 positive. EXAM: PORTABLE CHEST 1 VIEW COMPARISON:  03/02/2018. FINDINGS: Interval patchy opacity in the medial aspects of the lower half of the left lung with obscuration of the left heart border. No significant change in a small amount of linear density at the right lung base, possibly representing scarring or atelectasis. Poor inspiration. Old, healed inferior right scapula fracture. IMPRESSION: 1. Interval patchy opacity in the medial aspects of the lower half of the left lung compatible with pneumonia. 2. Stable small amount of linear scarring or atelectasis at the right lung base. Electronically Signed   By: Claudie Revering M.D.   On: 10/24/2019 19:05   VAS [place] LOWER EXTREMITY VENOUS (DVT)  Result Date: 10/27/2019  Lower Venous Study Indications: Elevated Ddimer.  Risk Factors: COVID 19 positive. Comparison Study: No prior studies. Performing Technologist: Oliver Hum RVT  Examination Guidelines: A complete evaluation includes B-mode imaging, spectral Doppler, color Doppler, and power Doppler as needed of all accessible portions of each vessel. Bilateral testing is considered an integral part of a complete examination. Limited examinations for reoccurring indications may be performed as noted.  +---------+---------------+---------+-----------+----------+--------------+ RIGHT    CompressibilityPhasicitySpontaneityPropertiesThrombus Aging +---------+---------------+---------+-----------+----------+--------------+ CFV      Full           Yes      Yes                                  +---------+---------------+---------+-----------+----------+--------------+ SFJ      Full                                                        +---------+---------------+---------+-----------+----------+--------------+ FV Prox  Full                                                        +---------+---------------+---------+-----------+----------+--------------+ FV Mid   Full                                                        +---------+---------------+---------+-----------+----------+--------------+  FV DistalFull                                                        +---------+---------------+---------+-----------+----------+--------------+ PFV      Full                                                        +---------+---------------+---------+-----------+----------+--------------+ POP      Full           Yes      Yes                                 +---------+---------------+---------+-----------+----------+--------------+ PTV      Full                                                        +---------+---------------+---------+-----------+----------+--------------+ PERO     Full                                                        +---------+---------------+---------+-----------+----------+--------------+ Gastroc  Partial                                      Acute          +---------+---------------+---------+-----------+----------+--------------+   +---------+---------------+---------+-----------+----------+--------------+ LEFT     CompressibilityPhasicitySpontaneityPropertiesThrombus Aging +---------+---------------+---------+-----------+----------+--------------+ CFV      Full           Yes      Yes                                 +---------+---------------+---------+-----------+----------+--------------+ SFJ      Full                                                         +---------+---------------+---------+-----------+----------+--------------+ FV Prox  Full                                                        +---------+---------------+---------+-----------+----------+--------------+ FV Mid   Full                                                        +---------+---------------+---------+-----------+----------+--------------+  FV DistalFull                                                        +---------+---------------+---------+-----------+----------+--------------+ PFV      Full                                                        +---------+---------------+---------+-----------+----------+--------------+ POP      Full           Yes      Yes                                 +---------+---------------+---------+-----------+----------+--------------+ PTV      Full                                                        +---------+---------------+---------+-----------+----------+--------------+ PERO     Full                                                        +---------+---------------+---------+-----------+----------+--------------+     Summary: Right: Findings consistent with acute deep vein thrombosis involving the right gastrocnemius veins. No cystic structure found in the popliteal fossa. Left: There is no evidence of deep vein thrombosis in the lower extremity. No cystic structure found in the popliteal fossa.  *See table(s) above for measurements and observations. Electronically signed by Monica Martinez MD on 10/27/2019 at 5:17:21 PM.    Final       Procedures:   Subjective: Patient is feeling better, dyspnea continue to improve, no chest pain, no nausea or vomiting.   Discharge Exam: Vitals:   10/27/19 2147 10/28/19 0500  BP: 105/68 131/73  Pulse: 67 68  Resp: 19 16  Temp: 97.9 F (36.6 C) 98.3 F (36.8 C)  SpO2: 97% 94%   Vitals:   10/27/19 1427 10/27/19 1600 10/27/19 2147 10/28/19 0500  BP:  106/67 100/75 105/68 131/73  Pulse: 80 78 67 68  Resp: 20 19 19 16   Temp: 98.5 F (36.9 C)  97.9 F (36.6 C) 98.3 F (36.8 C)  TempSrc: Axillary  Axillary Oral  SpO2: 95% 96% 97% 94%  Weight:      Height:        General: Not in pain or dyspnea.  Neurology: Awake and alert, non focal  E ENT: no pallor, no icterus, oral mucosa moist Cardiovascular: No JVD. S1-S2 present, rhythmic, no gallops, rubs, or murmurs. No lower extremity edema. Pulmonary: positive breath sounds bilaterally. Gastrointestinal. Abdomen with no organomegaly, non tender, no rebound or guarding Skin. No rashes Musculoskeletal: no joint deformities   The results of significant diagnostics from this hospitalization (including imaging, microbiology, ancillary and laboratory) are listed below for reference.     Microbiology: Recent Results (from the  past 240 hour(s))  Blood Culture (routine x 2)     Status: None (Preliminary result)   Collection Time: 10/24/19  6:18 PM   Specimen: BLOOD  Result Value Ref Range Status   Specimen Description BLOOD RIGHT ANTECUBITAL  Final   Special Requests   Final    BOTTLES DRAWN AEROBIC AND ANAEROBIC Blood Culture adequate volume   Culture   Final    NO GROWTH 4 DAYS Performed at [facility], [place] [address], [place], [place] [address]    Report Status PENDING  Incomplete  Blood Culture (routine x 2)     Status: None (Preliminary result)   Collection Time: 10/24/19  6:30 PM   Specimen: BLOOD LEFT WRIST  Result Value Ref Range Status   Specimen Description BLOOD LEFT WRIST  Final   Special Requests   Final    BOTTLES DRAWN AEROBIC ONLY Blood Culture results may not be optimal due to an inadequate volume of blood received in culture bottles   Culture   Final    NO GROWTH 4 DAYS Performed at [facility], [place] [address], [place], [place] [address]    Report Status PENDING  Incomplete  SARS CORONAVIRUS 2 (TAT 6-24 HRS) Nasopharyngeal Nasopharyngeal Swab      Status: Abnormal   Collection Time: 10/26/19  3:35 PM   Specimen: Nasopharyngeal Swab  Result Value Ref Range Status   SARS Coronavirus 2 POSITIVE (A) NEGATIVE Final    Comment: RESULT CALLED TO, READ BACK BY AND VERIFIED WITH: S POORE,RN 1942 10/26/2019 D BRADLEY (NOTE) SARS-CoV-2 target nucleic acids are DETECTED. The SARS-CoV-2 RNA is generally detectable in upper and lower respiratory specimens during the acute phase of infection. Positive results are indicative of the presence of SARS-CoV-2 RNA. Clinical correlation with patient history and other diagnostic information is  necessary to determine patient infection status. Positive results do not rule out bacterial infection or co-infection with other viruses.  The expected result is Negative. Fact Sheet for Patients: SugarRoll.be Fact Sheet for Healthcare Providers: https://www.woods-mathews.com/ This test is not yet approved or cleared by the [place] FDA and  has been authorized for detection and/or diagnosis of SARS-CoV-2 by FDA under an Emergency Use Authorization (EUA). This EUA will remain  in effect (meaning this test can be used) for th e duration of the COVID-19 declaration under Section 564(b)(1) of the Act, 21 U.S.C. section 360bbb-3(b)(1), unless the authorization is terminated or revoked sooner. Performed at [facility], [place] [address] [place], [place] [address]      Labs: BNP (last 3 results) No results for input(s): BNP in the last 8760 hours. Basic Metabolic Panel: Recent Labs  Lab 10/24/19 1818 10/24/19 2357 10/25/19 0700 10/26/19 1206 10/27/19 0405 10/28/19 0313  NA 134*  --  137 138 137 139  K 2.9*  --  2.9* 4.2 3.8 4.0  CL 93*  --  94* 96* 95* 94*  CO2 31  --  30 33* 32 33*  GLUCOSE 115*  --  164* 139* 134* 145*  BUN 10  --  13 17 18 18   CREATININE 0.81 0.74 0.78 0.81 0.69 0.85  CALCIUM 8.4*  --  8.6* 8.9 8.7* 8.8*  MG  --   --  1.9  --    --   --    Liver Function Tests: Recent Labs  Lab 10/24/19 1818 10/25/19 0700 10/26/19 1206 10/27/19 0405 10/28/19 0313  AST 20 17 23 18 24   ALT  14 13 16 14 18   ALKPHOS 33* 31* 36* 31* 32*  BILITOT 0.9 0.7 0.5 0.4 0.4  PROT 5.5* 5.5* 6.0* 5.2* 5.4*  ALBUMIN 3.1* 2.7* 3.0* 2.7* 2.9*   No results for input(s): LIPASE, AMYLASE in the last 168 hours. No results for input(s): AMMONIA in the last 168 hours. CBC: Recent Labs  Lab 10/24/19 1818 10/24/19 2357 10/25/19 0700 10/28/19 0313  WBC 5.6 7.2 4.6 9.8  NEUTROABS 4.8  --  4.1  --   HGB 14.5 13.3 13.5 14.9  HCT 43.0 39.9 39.9 44.7  MCV 89.6 89.7 89.3 90.5  PLT 160 154 152 250   Cardiac Enzymes: No results for input(s): CKTOTAL, CKMB, CKMBINDEX, TROPONINI in the last 168 hours. BNP: Invalid input(s): POCBNP CBG: Recent Labs  Lab 10/27/19 2204  GLUCAP 158*   D-Dimer Recent Labs    10/27/19 0405 10/28/19 0313  DDIMER 0.77* 0.64*   Hgb A1c No results for input(s): HGBA1C in the last 72 hours. Lipid Profile No results for input(s): CHOL, HDL, LDLCALC, TRIG, CHOLHDL, LDLDIRECT in the last 72 hours. Thyroid function studies No results for input(s): TSH, T4TOTAL, T3FREE, THYROIDAB in the last 72 hours.  Invalid input(s): FREET3 Anemia work up Recent Labs    10/27/19 0405 10/28/19 0313  FERRITIN 372* 307   Urinalysis    Component Value Date/Time   COLORURINE YELLOW 03/22/2018 1616   APPEARANCEUR HAZY (A) 03/22/2018 1616   LABSPEC 1.015 03/22/2018 1616   PHURINE 7.0 03/22/2018 1616   GLUCOSEU NEGATIVE 03/22/2018 1616   HGBUR MODERATE (A) 03/22/2018 1616   BILIRUBINUR NEGATIVE 03/22/2018 1616   [place] 03/22/2018 1616   PROTEINUR 30 (A) 03/22/2018 1616   NITRITE NEGATIVE 03/22/2018 1616   LEUKOCYTESUR LARGE (A) 03/22/2018 1616   Sepsis Labs Invalid input(s): PROCALCITONIN,  WBC,  LACTICIDVEN Microbiology Recent Results (from the past 240 hour(s))  Blood Culture (routine x 2)     Status:  None (Preliminary result)   Collection Time: 10/24/19  6:18 PM   Specimen: BLOOD  Result Value Ref Range Status   Specimen Description BLOOD RIGHT ANTECUBITAL  Final   Special Requests   Final    BOTTLES DRAWN AEROBIC AND ANAEROBIC Blood Culture adequate volume   Culture   Final    NO GROWTH 4 DAYS Performed at [facility], [place] [address] [place], [place] [address]    Report Status PENDING  Incomplete  Blood Culture (routine x 2)     Status: None (Preliminary result)   Collection Time: 10/24/19  6:30 PM   Specimen: BLOOD LEFT WRIST  Result Value Ref Range Status   Specimen Description BLOOD LEFT WRIST  Final   Special Requests   Final    BOTTLES DRAWN AEROBIC ONLY Blood Culture results may not be optimal due to an inadequate volume of blood received in culture bottles   Culture   Final    NO GROWTH 4 DAYS Performed at [facility], [place] [address] [place], [place] [address]    Report Status PENDING  Incomplete  SARS CORONAVIRUS 2 (TAT 6-24 HRS) Nasopharyngeal Nasopharyngeal Swab     Status: Abnormal   Collection Time: 10/26/19  3:35 PM   Specimen: Nasopharyngeal Swab  Result Value Ref Range Status   SARS Coronavirus 2 POSITIVE (A) NEGATIVE Final    Comment: RESULT CALLED TO, READ BACK BY AND VERIFIED WITH: S POORE,RN 1942 10/26/2019 D BRADLEY (NOTE) SARS-CoV-2 target nucleic acids are DETECTED. The SARS-CoV-2 RNA is generally  detectable in upper and lower respiratory specimens during the acute phase of infection. Positive results are indicative of the presence of SARS-CoV-2 RNA. Clinical correlation with patient history and other diagnostic information is  necessary to determine patient infection status. Positive results do not rule out bacterial infection or co-infection with other viruses.  The expected result is Negative. Fact Sheet for Patients: SugarRoll.be Fact Sheet for Healthcare  Providers: https://www.woods-mathews.com/ This test is not yet approved or cleared by the [place] FDA and  has been authorized for detection and/or diagnosis of SARS-CoV-2 by FDA under an Emergency Use Authorization (EUA). This EUA will remain  in effect (meaning this test can be used) for th e duration of the COVID-19 declaration under Section 564(b)(1) of the Act, 21 U.S.C. section 360bbb-3(b)(1), unless the authorization is terminated or revoked sooner. Performed at [facility], [place] [address], [place], [place] [address]      Time coordinating discharge: 45 minutes  SIGNED:   Tawni Millers, MD  [facility] 10/28/2019, 9:45 AM

## 2019-10-28 NOTE — TOC Transition Note (Addendum)
Transition of Care [facility]) - CM/SW Discharge Note   Patient Details  Name: FIRMIN WEYER MRN: [id number] Date of Birth: 09/02/1922  [facility] CM/SW Contact:  Maryclare Labrador, RN Phone Number: 10/28/2019, 11:42 AM   Clinical Narrative:    Pt to discharge back to SNF today.  CM confirms with Mickel Baas at facility that pt can be received today.  CM communicated that the pt would discharge home on Xarelto,  CM faxed d/c summary to [phone].   Son made aware yesterday plan was to transfer to back to SNF today.   Room Number not given for pt - pt will go to the 300 hall, report can be called to [phone]   CM will print transport pkg to nurse station and request that it be placed on the chart.  Bedside nurse to call report once pt is ready.  Facility request to be called once pt leaves [facility] at either number above or [phone].  Bedside nurse aware          Patient Goals and CMS Choice        Discharge Placement                       Discharge Plan and Services                                     Social Determinants of Health (SDOH) Interventions     Readmission Risk Interventions No flowsheet data found.

## 2019-10-28 NOTE — Progress Notes (Signed)
Pt prepared for d/c back to SNF. IV d/c'd. Skin intact except as charted in most recent assessments. Vitals are stable. Report called to receiving facility, given to [facility]. Pt to be transported by ambulance service.

## 2019-10-29 LAB — CULTURE, BLOOD (ROUTINE X 2)
Culture: NO GROWTH
Culture: NO GROWTH
Special Requests: ADEQUATE

## 2019-11-03 ENCOUNTER — Other Ambulatory Visit: Payer: Self-pay

## 2019-11-03 ENCOUNTER — Emergency Department (HOSPITAL_COMMUNITY)
Admission: EM | Admit: 2019-11-03 | Discharge: 2019-11-03 | Disposition: A | Discharge status: Home / Self Care | Payer: Medicare Other | Attending: Emergency Medicine | Admitting: Emergency Medicine

## 2019-11-03 DIAGNOSIS — Z79899 Other long term (current) drug therapy: Secondary | ICD-10-CM | POA: Diagnosis not present

## 2019-11-03 DIAGNOSIS — Z91040 Latex allergy status: Secondary | ICD-10-CM | POA: Insufficient documentation

## 2019-11-03 DIAGNOSIS — I10 Essential (primary) hypertension: Secondary | ICD-10-CM | POA: Insufficient documentation

## 2019-11-03 DIAGNOSIS — I2510 Atherosclerotic heart disease of native coronary artery without angina pectoris: Secondary | ICD-10-CM | POA: Insufficient documentation

## 2019-11-03 DIAGNOSIS — F0390 Unspecified dementia without behavioral disturbance: Secondary | ICD-10-CM | POA: Diagnosis not present

## 2019-11-03 DIAGNOSIS — Z87891 Personal history of nicotine dependence: Secondary | ICD-10-CM | POA: Diagnosis not present

## 2019-11-03 DIAGNOSIS — R319 Hematuria, unspecified: Secondary | ICD-10-CM | POA: Insufficient documentation

## 2019-11-03 LAB — CBC WITH DIFFERENTIAL/PLATELET
Abs Immature Granulocytes: 0.18 10*3/uL — ABNORMAL HIGH (ref 0.00–0.07)
Basophils Absolute: 0.1 10*3/uL (ref 0.0–0.1)
Basophils Relative: 0 %
Eosinophils Absolute: 0.1 10*3/uL (ref 0.0–0.5)
Eosinophils Relative: 1 %
HCT: 50.7 % (ref 39.0–52.0)
Hemoglobin: 15.8 g/dL (ref 13.0–17.0)
Immature Granulocytes: 1 %
Lymphocytes Relative: 4 %
Lymphs Abs: 0.6 10*3/uL — ABNORMAL LOW (ref 0.7–4.0)
MCH: 29.7 pg (ref 26.0–34.0)
MCHC: 31.2 g/dL (ref 30.0–36.0)
MCV: 95.3 fL (ref 80.0–100.0)
Monocytes Absolute: 0.7 10*3/uL (ref 0.1–1.0)
Monocytes Relative: 5 %
Neutro Abs: 11.5 10*3/uL — ABNORMAL HIGH (ref 1.7–7.7)
Neutrophils Relative %: 89 %
Platelets: 246 10*3/uL (ref 150–400)
RBC: 5.32 MIL/uL (ref 4.22–5.81)
RDW: 13.7 % (ref 11.5–15.5)
WBC: 13.1 10*3/uL — ABNORMAL HIGH (ref 4.0–10.5)
nRBC: 0 % (ref 0.0–0.2)

## 2019-11-03 LAB — COMPREHENSIVE METABOLIC PANEL
ALT: 12 U/L (ref 0–44)
AST: 18 U/L (ref 15–41)
Albumin: 3 g/dL — ABNORMAL LOW (ref 3.5–5.0)
Alkaline Phosphatase: 53 U/L (ref 38–126)
Anion gap: 13 (ref 5–15)
BUN: 19 mg/dL (ref 8–23)
CO2: 28 mmol/L (ref 22–32)
Calcium: 9.2 mg/dL (ref 8.9–10.3)
Chloride: 99 mmol/L (ref 98–111)
Creatinine, Ser: 1.04 mg/dL (ref 0.61–1.24)
GFR calc Af Amer: >60 mL/min (ref >60)
GFR calc non Af Amer: >60 mL/min (ref >60)
Glucose, Bld: 111 mg/dL — ABNORMAL HIGH (ref 70–99)
Potassium: 4.4 mmol/L (ref 3.5–5.1)
Sodium: 140 mmol/L (ref 135–145)
Total Bilirubin: 1.2 mg/dL (ref 0.3–1.2)
Total Protein: 6.3 g/dL — ABNORMAL LOW (ref 6.5–8.1)

## 2019-11-03 LAB — URINALYSIS, ROUTINE W REFLEX MICROSCOPIC
Bilirubin Urine: NEGATIVE
Glucose, UA: NEGATIVE mg/dL
Ketones, ur: >80 mg/dL — AB
Nitrite: POSITIVE — AB
Protein, ur: >300 mg/dL — AB
Specific Gravity, Urine: 1.025 (ref 1.005–1.030)
pH: 5 (ref 5.0–8.0)

## 2019-11-03 LAB — URINALYSIS, MICROSCOPIC (REFLEX)
RBC / HPF: >50 RBC/hpf (ref 0–5)
Squamous Epithelial / HPF: NONE SEEN (ref 0–5)
WBC, UA: >50 WBC/hpf (ref 0–5)

## 2019-11-03 MED ORDER — MORPHINE SULFATE (PF) 2 MG/ML IV SOLN
2.0000 mg | Freq: Once | INTRAVENOUS | Status: AC
  Administered 2019-11-03: 2 mg via INTRAVENOUS
  Filled 2019-11-03: qty 1

## 2019-11-03 MED ORDER — ONDANSETRON HCL 4 MG/2ML IJ SOLN
4.0000 mg | Freq: Once | INTRAMUSCULAR | Status: AC
  Administered 2019-11-03: 4 mg via INTRAVENOUS
  Filled 2019-11-03: qty 2

## 2019-11-03 MED ORDER — SULFAMETHOXAZOLE-TRIMETHOPRIM 800-160 MG PO TABS: 1.0000 | ORAL_TABLET | Freq: Two times a day (BID) | ORAL | 0 refills | Status: AC

## 2019-11-03 NOTE — ED Notes (Signed)
Unable to advance foley catheter or coude catheter. ED PA made aware.

## 2019-11-03 NOTE — ED Triage Notes (Signed)
Pt BIB [facility] from [facility] with an episode of hematuria that occurred yesterday, pt also had an episode of vomiting yesterday that had maroon colored emesis. Pt complaining of needing to urinate but being unable to go. Pt is HOH.

## 2019-11-03 NOTE — ED Provider Notes (Addendum)
[facility]ERGENCY DEPARTMENT Provider Note   CSN: [id number] Arrival date & time: 11/03/19  1158     History Chief Complaint  Patient presents with  . Hematuria    Warren [name] is a 85 y.o. male with PMH significant for advanced dementia, CAD, prostate cancer, DVT recently started on rivaroxaban and recent admission for acute hypoxic respiratory failure due to COVID-19 pneumonia with discharge 10/24/2019.  Patient is DNR.  He presents to the ED via EMS from [facility] for an episode of hematuria and maroon-colored emesis.  Patient is the grandfather of Kathlee Nations, a nurse here.  Patient complains of significant suprapubic tenderness x4 hours as well as nausea and vomiting all morning.  Patient does not recall his recent admission to the hospital for COVID-19 related pneumonia.  He is in briefs and is incontinent at baseline.  He also endorses mild, generalized abdominal pain, but not nearly as profound as his suprapubic discomfort.  His reported gross hematuria 2 days ago was painless and he states that he has not been able to urinate since.  Patient denies any fevers or chills, chest pain or shortness of breath, headache or dizziness, or any focal neurologic deficits.   HPI     Past Medical History:  Diagnosis Date  . CAD (coronary artery disease)    moderate by catheterization,11/08...essentially unchanged by repeat study,10/11  . Carotid artery disease (HCC)     less than 50% stenosis bilaterally March 2013   . Claudication ([place])     ABIs within ABIs within normal limits, 40% left iliac disease by catheterization   . Colon polyps   . Depression   . Dizzy   . Edema extremities     secondary to severe varicose veins. Nonpitting edema and dermatitis.   . Emphysema   . Glaucoma   . Hyperlipidemia   . IBS (irritable bowel syndrome)   . Mitral regurgitation    mild  . Mixed dyslipidemia   . Peripheral edema   . Pneumonia   . Prostate cancer  [facility])    s/p radiation treatment/s/p TURP  . Shortness of breath    Emphysema  . Thyroid nodule     posterior and inferior to the right lobepossibly outside of the gland secondary to parathyroid adenoma in addition multiple bilateral thyroid nodules     Patient Active Problem List   Diagnosis Date Noted  . DNR (do not resuscitate) 10/25/2019  . Acute respiratory failure with hypoxia ([place]) 10/24/2019  . Pneumonia due to COVID-19 virus 10/24/2019  . Barrett's esophagus 04/03/2018  . Candida esophagitis ([place]) 04/03/2018  . Anxiety 04/03/2018  . Esophageal dysfunction 03/23/2018  . Urinary tract infection associated with catheterization of urinary tract, initial encounter ([place]) 03/23/2018  . Leukocytosis 03/23/2018  . Dementia ([place]) 03/23/2018  . Dehydration   . Acute metabolic encephalopathy   . Nausea and vomiting 03/22/2018  . Pseudomonas urinary tract infection 03/06/2018  . Depression 03/06/2018  . Anxiety and depression 03/06/2018  . Osteoporosis 03/06/2018  . Acute lower UTI 03/01/2018  . Internal device, implant, or graft infection or inflammation 03/01/2018  . Emphysema   . Thyroid nodule   . Carotid artery disease ([place])   . CAD (coronary artery disease)   . Edema extremities   . Claudication ([place]) 07/30/2011  . Dyslipidemia 07/03/2010  . HYPERTENSION, BENIGN 07/03/2010  . PERIPHERAL EDEMA 07/03/2010  . SHORTNESS OF BREATH 07/03/2010  . PRECORDIAL PAIN 07/03/2010    Past Surgical History:  Procedure Laterality Date  . BIOPSY  03/24/2018   Procedure: BIOPSY;  Surgeon: Jackquline Denmark, MD;  Location: [facility];  Service: Endoscopy;;  . CARDIAC CATHETERIZATION  2008  . CHOLECYSTECTOMY    . CYSTOSCOPY N/A 02/27/2018   Procedure: CYSTOSCOPY;  Surgeon: Bjorn Loser, MD;  Location: [facility];  Service: Urology;  Laterality: N/A;  . ESOPHAGOGASTRODUODENOSCOPY (EGD) WITH PROPOFOL N/A 03/24/2018   Procedure: ESOPHAGOGASTRODUODENOSCOPY (EGD) WITH PROPOFOL;  Surgeon:  Jackquline Denmark, MD;  [facility];  Service: Endoscopy;  Laterality: N/A;  . INCONTINENCE SURGERY     ring inserted  . MALONEY DILATION  03/24/2018   Procedure: Venia Minks DILATION;  Surgeon: Jackquline Denmark, MD;  Location: [facility];  Service: Endoscopy;;  . TONSILLECTOMY    . TRANSURETHRAL RESECTION OF PROSTATE     has stress urinary incontinence  . URINARY SPHINCTER IMPLANT N/A 02/27/2018   Procedure: REMOVAL INFECTED ARTIFICIAL URINARY SPHINCTER;  Surgeon: Bjorn Loser, MD;  Location: [facility];  Service: Urology;  Laterality: N/A;       Family History  Problem Relation Age of Onset  . Heart attack Sister        in her 68's  . Other Mother        lived to be 56  . Pancreatic cancer Father   . Crohn's disease Son   . Colon cancer Neg Hx     Social History   Tobacco Use  . Smoking status: Former Smoker    Packs/day: 1.00    Years: 40.00    Pack years: 40.00    Types: Cigarettes    Quit date: 11/04/1983    Years since quitting: 36.0  . Smokeless tobacco: Never Used  Substance Use Topics  . Alcohol use: No  . Drug use: No    Home Medications Prior to Admission medications   Medication Sig Start Date End Date Taking? Authorizing Provider  alendronate (FOSAMAX) 70 MG tablet Take 70 mg by mouth every Wednesday. Take with a full glass of water on an empty stomach.    [provider]  ascorbic acid (VITAMIN C) 500 MG tablet Take 500 mg by mouth daily.    [provider]  calcium carbonate (OS-CAL - DOSED IN MG OF ELEMENTAL CALCIUM) 1250 (500 Ca) MG tablet Take 1 tablet by mouth 2 (two) times daily.    [provider]  Cholecalciferol 50 MCG (2000 UT) TABS Take 2,000 Units by mouth daily.     [provider]  citalopram (CELEXA) 20 MG tablet Take 20 mg by mouth daily.     [provider]  fluticasone (FLONASE) 50 MCG/ACT nasal spray Place 1 spray into both nostrils 2 (two) times daily. 10/28/19   Arrien, Jimmy Picket, MD   guaiFENesin (MUCINEX) 600 MG 12 hr tablet Take 600 mg by mouth 2 (two) times daily.    [provider]  guaiFENesin-dextromethorphan (ROBITUSSIN DM) 100-10 MG/5ML syrup Take 5 mLs by mouth every 4 (four) hours as needed for cough. 10/28/19   Arrien, Jimmy Picket, MD  LORazepam (ATIVAN) 0.5 MG tablet Take 0.5-1 tablets (0.25-0.5 mg total) by mouth at bedtime as needed for anxiety or sleep. 10/28/19   Arrien, Jimmy Picket, MD  menthol-cetylpyridinium (CEPACOL) 3 MG lozenge Take 1 lozenge (3 mg total) by mouth every 4 (four) hours as needed for sore throat. 10/28/19   Arrien, Jimmy Picket, MD  mirabegron ER (MYRBETRIQ) 25 MG TB24 tablet Take 25 mg by mouth daily.    [provider]  Nutritional  Supplements (NUTRITIONAL SUPPLEMENT PO) Take 120 mLs by mouth 2 (two) times daily with a meal. MedPass 2.0    [provider]  omeprazole (PRILOSEC OTC) 20 MG tablet Take 20 mg by mouth 2 (two) times daily.    [provider]  ondansetron (ZOFRAN) 4 MG tablet Take 1 tablet (4 mg total) by mouth every 6 (six) hours as needed for nausea. 10/28/19   Arrien, Jimmy Picket, MD  oxybutynin (DITROPAN) 5 MG tablet Take 1 tablet (5 mg total) by mouth 2 (two) times daily. Patient not taking: Reported on 10/24/2019 03/25/18   Velvet Bathe, MD  pravastatin (PRAVACHOL) 40 MG tablet Take 40 mg by mouth at bedtime.     [provider]  Rivaroxaban 15 & 20 MG TBPK Follow package directions: Take one 15mg  tablet by mouth twice a day. On day 22, switch to one 20mg  tablet once a day. Take with food. 10/28/19   Arrien, Jimmy Picket, MD  sodium chloride (OCEAN) 0.65 % SOLN nasal spray Place 2 sprays into both nostrils 3 (three) times daily as needed for congestion.    [provider]  sulfamethoxazole-trimethoprim (BACTRIM DS) 800-160 MG tablet Take 1 tablet by mouth 2 (two) times daily for 7 days. 11/03/19 11/10/19  Corena Herter, PA-C  tamsulosin (FLOMAX) 0.4 MG  CAPS capsule Take 0.4 mg by mouth daily.    [provider]  zinc sulfate 220 (50 Zn) MG capsule Take 220 mg by mouth 2 (two) times daily.    [provider]  atorvastatin (LIPITOR) 40 MG tablet Take 40 mg by mouth at bedtime.    01/27/12  [provider]  furosemide (LASIX) 20 MG tablet Take 1 tablet (20 mg total) by mouth daily. 07/30/11 01/27/12  de Stanford Scotland, MD    Allergies    Aspirin, Cephalexin, Latex, and Penicillins  Review of Systems   Review of Systems  Physical Exam Updated Vital Signs BP 118/77   Pulse 95   Temp 98.8 F (37.1 C) (Oral)   Resp 19   Ht 5\' 6"  (1.676 m)   Wt 71.3 kg   SpO2 95%   BMI 25.37 kg/m   Physical Exam Vitals and nursing note reviewed. Exam conducted with a chaperone present.  Constitutional:      Appearance: Normal appearance.  HENT:     Head: Normocephalic and atraumatic.  Eyes:     General: No scleral icterus.    Conjunctiva/sclera: Conjunctivae normal.  Cardiovascular:     Rate and Rhythm: Normal rate and regular rhythm.     Pulses: Normal pulses.     Heart sounds: Normal heart sounds.  Pulmonary:     Effort: Pulmonary effort is normal.     Breath sounds: Normal breath sounds.  Abdominal:     Comments: Soft, nondistended. TTP in abdomen, most notably in suprapubic region.  Genitourinary:    Comments: Blood at urethral meatus. Musculoskeletal:     Cervical back: Normal range of motion and neck supple. No rigidity.  Skin:    General: Skin is dry.  Neurological:     Mental Status: He is alert.     GCS: GCS eye subscore is 4. GCS verbal subscore is 5. GCS motor subscore is 6.  Psychiatric:        Mood and Affect: Mood normal.        Behavior: Behavior normal.        Thought Content: Thought content normal.     ED Results /  Procedures / Treatments   Labs (all labs ordered are listed, but only abnormal results are displayed) Labs Reviewed  CBC WITH DIFFERENTIAL/PLATELET - Abnormal; Notable for the  following components:      Result Value   WBC 13.1 (*)    Neutro Abs 11.5 (*)    Lymphs Abs 0.6 (*)    Abs Immature Granulocytes 0.18 (*)    All other components within normal limits  COMPREHENSIVE METABOLIC PANEL - Abnormal; Notable for the following components:   Glucose, Bld 111 (*)    Total Protein 6.3 (*)    Albumin 3.0 (*)    All other components within normal limits  URINE CULTURE  URINALYSIS, ROUTINE W REFLEX MICROSCOPIC    EKG None  Radiology No results found.  Procedures Procedures (including critical care time)  Medications Ordered in ED Medications  morphine 2 MG/ML injection 2 mg (2 mg Intravenous Given 11/03/19 1335)  ondansetron (ZOFRAN) injection 4 mg (4 mg Intravenous Given 11/03/19 1333)  morphine 2 MG/ML injection 2 mg (2 mg Intravenous Given 11/03/19 1459)    ED Course  I have reviewed the triage vital signs and the nursing notes.  Pertinent labs & imaging results that were available during my care of the patient were reviewed by me and considered in my medical decision making (see chart for details).    MDM Rules/Calculators/A&P                      Spoke with urology, Dr. Claudia Desanctis, who will see patient for assessment and ultimate catheter placement.  Spoke with Tim, patient's son, and there was an unsuccessful Foley catheter placement last night at the SNF.  Catheter placement has been an ongoing issue for this patient.  Prostate cancer was 15-20 years ago. Approximately 30 radiation treatments. Sphinchter lasted approximately 15 years and was removed two years ago.  Lab work demonstrates mild leukocytosis of 13.1, up from 9.86 days ago.  CMP demonstrates normal creatinine and renal function and without electrolyte abnormalities.  Patient's pain improved significantly after catheter was placed.  Only mild suprapubic "soreness" on repeat physical examination. Otherwise, patient reports that he is ready to go home.   Spoke with Dr. Anthony Sar who believes  patient may have urethral stricture requiring a smaller catheter.  He was able to successfully insert a 14 French catheter over a wire.  He recommends a course of antibiotics as well as follow-up in 1 to 3 weeks for catheter removal +/-cystoscopy.   Observe patient for another hour to ensure that his catheter was continue to drain.  Patient feels significantly improved.  His granddaughter, Kathlee Nations, states that he is at his baseline given his dementia.  She agrees that he looks markedly improved.  He is no longer complaining of significant pain and discomfort.  Given his significant improvement, feel safe for discharge at this time.  Patient voiced understanding and is agreeable to the plan.   Final Clinical Impression(s) / ED Diagnoses Final diagnoses:  Hematuria, unspecified type    Rx / DC Orders ED Discharge Orders         Ordered    sulfamethoxazole-trimethoprim (BACTRIM DS) 800-160 MG tablet  2 times daily     11/03/19 1853           Reita Chard 11/03/19 Agustin Cree, MD 11/04/19 1941    Corena Herter, PA-C 11/13/19 [id number]    Blanchie Dessert, MD 11/13/19 1656

## 2019-11-03 NOTE — ED Notes (Signed)
Pt continues to c/o pain. Ed pa made aware.

## 2019-11-03 NOTE — Consult Note (Addendum)
Urology Consult Note   Requesting Attending Physician:  Blanchie Dessert, MD Service Providing Consult: Urology  Consulting Attending: Dr. Claudia Desanctis  Reason for Consult:  Urinary retention, difficult foley  HPI: Warren [name] is seen in consultation for reasons noted above at the request of Blanchie Dessert, MD for evaluation of difficult catheter placement.  This is a 85 y.o. male with history of prostate cancer, urinary incontinence.  Prostate cancer was treated with external beam radiotherapy.  He also had a TURP and subsequently developed incontinence.  Due to postoperative incontinence he had a artificial urinary center placed.  The artificial urinary sphincter was removed in April 2019 after catheter trauma which caused  an erosion at the 5 o'clock position of the cuff.  Urethra was not closed intraoperatively ([place]). He was last seen on 04/09/2018 for catheter removal. Has been incontinent of urine since that time  On 11/03/19 he developed hematuria with difficulty voiding at his nursing home. Staff attempted catheterization but was unsuccessful.  He presented to the [facility] emergency room on 11/03/2019 and was still unable to urinate.  Bladder scan showed approximately 500 cc and nursing attempted catheterization x2 but were unsuccessful. Urology was consulted.   Upon evaluation patient was difficult to understand and much of the history is obtained from his son.  Denies any interval urologic procedures since catheter removal and AUS removal in spring 2019  Of note he developed Covid pneumonia approximately 1 week ago and is doing well recovering at his nursing home without need for supplemental oxygen.  He is on dual antiplatelet therapy for CAD, aspirin and Plavix.  Was leaking 1 pad per day and plan is to follow-up as needed.  Procedure note: Due to possible urethral stricture decision was made to place a smaller caliber catheter.  After sterile prepping and draping an  angle-tip Glidewire was advanced into the urethra with the majority of the wire entering the urethral lumen, indicating was in the bladder.  A 14 French silicone catheter was then turned into a council tip catheter with aid of an Angiocath.  The catheter was advanced over to the wire and into the bladder with no resistance.  Light red urine returned indicating proper placement and the wire was removed.  10 cc of sterile water were placed in the balloon.  Approximately 350 cc of bloody urine were drained.  The catheter was then hand irrigated with 1 L of sterile water until draining urine was clear.  There were minimal clots.  Catheter was attached to drainage bag.    Past Medical History: Past Medical History:  Diagnosis Date   CAD (coronary artery disease)    moderate by catheterization,11/08...essentially unchanged by repeat study,10/11   Carotid artery disease ([place])     less than 50% stenosis bilaterally March 2013    Claudication [facility])     ABIs within ABIs within normal limits, 40% left iliac disease by catheterization    Colon polyps    Depression    Dizzy    Edema extremities     secondary to severe varicose veins. Nonpitting edema and dermatitis.    Emphysema    Glaucoma    Hyperlipidemia    IBS (irritable bowel syndrome)    Mitral regurgitation    mild   Mixed dyslipidemia    Peripheral edema    Pneumonia    Prostate cancer [facility])    s/p radiation treatment/s/p TURP   Shortness of breath    Emphysema   Thyroid nodule  posterior and inferior to the right lobepossibly outside of the gland secondary to parathyroid adenoma in addition multiple bilateral thyroid nodules     Past Surgical History:  Past Surgical History:  Procedure Laterality Date   BIOPSY  03/24/2018   Procedure: BIOPSY;  Surgeon: Jackquline Denmark, MD;  [facility];  Service: Endoscopy;;   CARDIAC CATHETERIZATION  2008   CHOLECYSTECTOMY     CYSTOSCOPY N/A 02/27/2018   Procedure: CYSTOSCOPY;   Surgeon: Bjorn Loser, MD;  [facility]: [facility];  Service: Urology;  Laterality: N/A;   ESOPHAGOGASTRODUODENOSCOPY (EGD) WITH PROPOFOL N/A 03/24/2018   Procedure: ESOPHAGOGASTRODUODENOSCOPY (EGD) WITH PROPOFOL;  Surgeon: Jackquline Denmark, MD;  [facility];  Service: Endoscopy;  Laterality: N/A;   INCONTINENCE SURGERY     ring inserted   MALONEY DILATION  03/24/2018   Procedure: MALONEY DILATION;  Surgeon: Jackquline Denmark, MD;  [facility];  Service: Endoscopy;;   TONSILLECTOMY     TRANSURETHRAL RESECTION OF PROSTATE     has stress urinary incontinence   URINARY SPHINCTER IMPLANT N/A 02/27/2018   Procedure: REMOVAL INFECTED ARTIFICIAL URINARY SPHINCTER;  Surgeon: Bjorn Loser, MD;  Location: [facility];  Service: Urology;  Laterality: N/A;    Medication: No current facility-administered medications for this encounter.   Current Outpatient Medications  Medication Sig Dispense Refill   alendronate (FOSAMAX) 70 MG tablet Take 70 mg by mouth every Wednesday. Take with a full glass of water on an empty stomach.     ascorbic acid (VITAMIN C) 500 MG tablet Take 500 mg by mouth daily.     calcium carbonate (OS-CAL - DOSED IN MG OF ELEMENTAL CALCIUM) 1250 (500 Ca) MG tablet Take 1 tablet by mouth 2 (two) times daily.     Cholecalciferol 50 MCG (2000 UT) TABS Take 2,000 Units by mouth daily.      citalopram (CELEXA) 20 MG tablet Take 20 mg by mouth daily.      fluticasone (FLONASE) 50 MCG/ACT nasal spray Place 1 spray into both nostrils 2 (two) times daily. 16 g 0   guaiFENesin (MUCINEX) 600 MG 12 hr tablet Take 600 mg by mouth 2 (two) times daily.     guaiFENesin-dextromethorphan (ROBITUSSIN DM) 100-10 MG/5ML syrup Take 5 mLs by mouth every 4 (four) hours as needed for cough. 118 mL 0   LORazepam (ATIVAN) 0.5 MG tablet Take 0.5-1 tablets (0.25-0.5 mg total) by mouth at bedtime as needed for anxiety or sleep. 10 tablet 0   menthol-cetylpyridinium (CEPACOL) 3 MG lozenge Take 1  lozenge (3 mg total) by mouth every 4 (four) hours as needed for sore throat. 20 tablet 12   mirabegron ER (MYRBETRIQ) 25 MG TB24 tablet Take 25 mg by mouth daily.     Nutritional Supplements (NUTRITIONAL SUPPLEMENT PO) Take 120 mLs by mouth 2 (two) times daily with a meal. MedPass 2.0     omeprazole (PRILOSEC OTC) 20 MG tablet Take 20 mg by mouth 2 (two) times daily.     ondansetron (ZOFRAN) 4 MG tablet Take 1 tablet (4 mg total) by mouth every 6 (six) hours as needed for nausea. 20 tablet 0   oxybutynin (DITROPAN) 5 MG tablet Take 1 tablet (5 mg total) by mouth 2 (two) times daily. (Patient not taking: Reported on 10/24/2019) 60 tablet 0   pravastatin (PRAVACHOL) 40 MG tablet Take 40 mg by mouth at bedtime.      Rivaroxaban 15 & 20 MG TBPK Follow package directions: Take one 15mg  tablet by mouth twice a day.  On day 22, switch to one 20mg  tablet once a day. Take with food. 51 each 0   sodium chloride (OCEAN) 0.65 % SOLN nasal spray Place 2 sprays into both nostrils 3 (three) times daily as needed for congestion.     tamsulosin (FLOMAX) 0.4 MG CAPS capsule Take 0.4 mg by mouth daily.     zinc sulfate 220 (50 Zn) MG capsule Take 220 mg by mouth 2 (two) times daily.      Allergies: Allergies  Allergen Reactions   Aspirin Other (See Comments)    Reaction - spots   Cephalexin Other (See Comments)    Reported by [facility] 08/19/2018   Latex Other (See Comments)    Unknown reaction   Penicillins     Has patient had a PCN reaction causing immediate rash, facial/tongue/throat swelling, SOB or lightheadedness with hypotension:No Has patient had a PCN reaction causing severe rash involving mucus membranes or skin necrosis: No Has patient had a PCN reaction that required hospitalization:NoHas patient had a PCN reaction occurring within the last 10 years:No If all of the above answers are "NO", then may proceed with Cephalosporin use.     Social History: Social History   Tobacco Use    Smoking status: Former Smoker    Packs/day: 1.00    Years: 40.00    Pack years: 40.00    Types: Cigarettes    Quit date: 11/04/1983    Years since quitting: 36.0   Smokeless tobacco: Never Used  Substance Use Topics   Alcohol use: No   Drug use: No    Family History Family History  Problem Relation Age of Onset   Heart attack Sister        in her 47's   Other Mother        lived to be 75   Pancreatic cancer Father    Crohn's disease Son    Colon cancer Neg Hx     Review of Systems 10 systems were reviewed and are negative except as noted specifically in the HPI.  Objective   Vital signs in last 24 hours: BP 125/83   Pulse 85   Temp 98.8 F (37.1 C) (Oral)   Resp 19   Ht 5\' 6"  (1.676 m)   Wt 71.3 kg   SpO2 96%   BMI 25.37 kg/m   Physical Exam General: Complaining suprapubic discomfort.   HEENT: [place]/AT, EOMI, MMM Pulmonary: Normal work of breathing Cardiovascular: HDS, Abdomen: Soft, tender to palpation in suprapubic area.  GU: No Foley catheter initially, now with 14 French silicone catheter draining clear fluid Extremities: warm and well perfused Neuro: Difficult to communicate with  Most Recent Labs: Lab Results  Component Value Date   WBC 13.1 (H) 11/03/2019   HGB 15.8 11/03/2019   HCT 50.7 11/03/2019   PLT 246 11/03/2019    Lab Results  Component Value Date   NA 140 11/03/2019   K 4.4 11/03/2019   CL 99 11/03/2019   CO2 28 11/03/2019   BUN 19 11/03/2019   CREATININE 1.04 11/03/2019   CALCIUM 9.2 11/03/2019   MG 1.9 10/25/2019    Lab Results  Component Value Date   INR 1.07 03/22/2018   APTT 33 02/05/2010     IMAGING: No results found.  ------  Assessment:  85 y.o. male with history of prostate cancer status post radiation, urinary incontinence status post removal of artificial urinary sphincter who presented with urinary retention and hematuria after multiple traumatic catheter placements.  Able to place 14 French catheter  without resistance over wire.  Hand irrigated with what appears to be removal of any clots within the bladder.   Recommendations: -Ensure catheter is draining with 1 to 2-hour period of observation and bladder scan (these were accurate prior to Foley placement)  -Keep Foley catheter for 2 to 4 weeks to allow the urethra to heal.  We will arrange follow-up for Foley removal and potential cystoscopy.  This has been requested in our record system.  -At follow-up, may forego cystoscopy if the patient is not a candidate for any intervention  -Recommend 5 days of empiric antibiotics for urinary tract infection.    - Follow-up results of urine culture  Care plan discussed with patient's son in detail   Thank you for this consult. Please contact the urology consult pager with any further questions/concerns.  This patient was discussed with Dr. Claudia Desanctis and I agree with the plan.

## 2019-11-03 NOTE — Discharge Instructions (Addendum)
Patient will require follow-up with urology in 1-3 weeks for catheter removal +/- cystoscopy.   Please take the antibiotics, as prescribed.  Return to the ED or seek medical attention for any new or worsening symptoms.

## 2019-11-05 LAB — URINE CULTURE: Culture: 50000 — AB

## 2019-11-06 ENCOUNTER — Other Ambulatory Visit: Payer: Self-pay | Admitting: Urology

## 2019-11-08 ENCOUNTER — Telehealth: Payer: Self-pay | Admitting: *Deleted

## 2019-11-08 NOTE — Telephone Encounter (Signed)
Post ED Visit - Positive Culture Follow-up  Culture report reviewed by antimicrobial stewardship pharmacist: [place] Team []  Elenor Quinones, Pharm.D. []  Heide Guile, Pharm.D., BCPS AQ-ID []  Parks Neptune, Pharm.D., BCPS []  Alycia Rossetti, Pharm.D., BCPS []  [place], Pharm.D., BCPS, AAHIVP []  Legrand Como, Pharm.D., BCPS, AAHIVP []  Salome Arnt, PharmD, BCPS []  Johnnette Gourd, PharmD, BCPS []  Hughes Better, PharmD, BCPS []  Leeroy Cha, PharmD []  Laqueta Linden, PharmD, BCPS []  Albertina Parr, PharmD  [place] Team []  Leodis Sias, PharmD []  Lindell Spar, PharmD []  Royetta Asal, PharmD []  Graylin Shiver, Rph []  Rema Fendt) Glennon Mac, PharmD []  Arlyn Dunning, PharmD []  Netta Cedars, PharmD []  Dia Sitter, PharmD []  Leone Haven, PharmD []  Gretta Arab, PharmD []  Theodis Shove, PharmD []  Peggyann Juba, PharmD []  Reuel Boom, PharmD   Positive urine culture Treated with Sulfamethoxazole-Trimethoprim, organism sensitive to the same and no further patient follow-up is required at this time.  Harlon Flor [facility] 11/08/2019, 10:24 AM

## 2019-11-09 ENCOUNTER — Other Ambulatory Visit: Payer: Self-pay

## 2019-11-09 ENCOUNTER — Encounter (HOSPITAL_COMMUNITY): Payer: Self-pay | Admitting: Emergency Medicine

## 2019-11-09 ENCOUNTER — Emergency Department (HOSPITAL_COMMUNITY)
Admission: EM | Admit: 2019-11-09 | Discharge: 2019-11-10 | Disposition: A | Discharge status: Home / Self Care | Payer: Medicare Other | Attending: Emergency Medicine | Admitting: Emergency Medicine

## 2019-11-09 DIAGNOSIS — Z91040 Latex allergy status: Secondary | ICD-10-CM | POA: Diagnosis not present

## 2019-11-09 DIAGNOSIS — T83511S Infection and inflammatory reaction due to indwelling urethral catheter, sequela: Secondary | ICD-10-CM | POA: Diagnosis not present

## 2019-11-09 DIAGNOSIS — I10 Essential (primary) hypertension: Secondary | ICD-10-CM | POA: Insufficient documentation

## 2019-11-09 DIAGNOSIS — N390 Urinary tract infection, site not specified: Secondary | ICD-10-CM | POA: Insufficient documentation

## 2019-11-09 DIAGNOSIS — N509 Disorder of male genital organs, unspecified: Secondary | ICD-10-CM | POA: Diagnosis present

## 2019-11-09 DIAGNOSIS — Z79899 Other long term (current) drug therapy: Secondary | ICD-10-CM | POA: Diagnosis not present

## 2019-11-09 DIAGNOSIS — Y732 Prosthetic and other implants, materials and accessory gastroenterology and urology devices associated with adverse incidents: Secondary | ICD-10-CM | POA: Insufficient documentation

## 2019-11-09 DIAGNOSIS — Z87891 Personal history of nicotine dependence: Secondary | ICD-10-CM | POA: Diagnosis not present

## 2019-11-09 DIAGNOSIS — F0390 Unspecified dementia without behavioral disturbance: Secondary | ICD-10-CM | POA: Diagnosis not present

## 2019-11-09 DIAGNOSIS — I2510 Atherosclerotic heart disease of native coronary artery without angina pectoris: Secondary | ICD-10-CM | POA: Insufficient documentation

## 2019-11-09 NOTE — ED Triage Notes (Signed)
Patient arrived with EMS from [facility] reports hematemesis x1 this evening with penile pain and urinary retention .

## 2019-11-10 DIAGNOSIS — T83511S Infection and inflammatory reaction due to indwelling urethral catheter, sequela: Secondary | ICD-10-CM | POA: Diagnosis not present

## 2019-11-10 LAB — CBC WITH DIFFERENTIAL/PLATELET
Abs Immature Granulocytes: 0.12 10*3/uL — ABNORMAL HIGH (ref 0.00–0.07)
Basophils Absolute: 0 10*3/uL (ref 0.0–0.1)
Basophils Relative: 0 %
Eosinophils Absolute: 0.1 10*3/uL (ref 0.0–0.5)
Eosinophils Relative: 1 %
HCT: 40.7 % (ref 39.0–52.0)
Hemoglobin: 13.1 g/dL (ref 13.0–17.0)
Immature Granulocytes: 1 %
Lymphocytes Relative: 7 %
Lymphs Abs: 0.8 10*3/uL (ref 0.7–4.0)
MCH: 29.9 pg (ref 26.0–34.0)
MCHC: 32.2 g/dL (ref 30.0–36.0)
MCV: 92.9 fL (ref 80.0–100.0)
Monocytes Absolute: 0.8 10*3/uL (ref 0.1–1.0)
Monocytes Relative: 7 %
Neutro Abs: 9.7 10*3/uL — ABNORMAL HIGH (ref 1.7–7.7)
Neutrophils Relative %: 84 %
Platelets: 189 10*3/uL (ref 150–400)
RBC: 4.38 MIL/uL (ref 4.22–5.81)
RDW: 13.8 % (ref 11.5–15.5)
WBC: 11.6 10*3/uL — ABNORMAL HIGH (ref 4.0–10.5)
nRBC: 0 % (ref 0.0–0.2)

## 2019-11-10 LAB — BASIC METABOLIC PANEL
Anion gap: 11 (ref 5–15)
BUN: 20 mg/dL (ref 8–23)
CO2: 30 mmol/L (ref 22–32)
Calcium: 9.1 mg/dL (ref 8.9–10.3)
Chloride: 92 mmol/L — ABNORMAL LOW (ref 98–111)
Creatinine, Ser: 1.03 mg/dL (ref 0.61–1.24)
GFR calc Af Amer: >60 mL/min (ref >60)
GFR calc non Af Amer: >60 mL/min (ref >60)
Glucose, Bld: 129 mg/dL — ABNORMAL HIGH (ref 70–99)
Potassium: 4.2 mmol/L (ref 3.5–5.1)
Sodium: 133 mmol/L — ABNORMAL LOW (ref 135–145)

## 2019-11-10 LAB — URINALYSIS, ROUTINE W REFLEX MICROSCOPIC
Bilirubin Urine: NEGATIVE
Glucose, UA: NEGATIVE mg/dL
Ketones, ur: NEGATIVE mg/dL
Nitrite: POSITIVE — AB
Protein, ur: >=300 mg/dL — AB
RBC / HPF: >50 RBC/hpf — ABNORMAL HIGH (ref 0–5)
Specific Gravity, Urine: 1.013 (ref 1.005–1.030)
WBC, UA: >50 WBC/hpf — ABNORMAL HIGH (ref 0–5)
pH: 9 — ABNORMAL HIGH (ref 5.0–8.0)

## 2019-11-10 LAB — CBC
HCT: 44.7 % (ref 39.0–52.0)
Hemoglobin: 14.5 g/dL (ref 13.0–17.0)
MCH: 30.2 pg (ref 26.0–34.0)
MCHC: 32.4 g/dL (ref 30.0–36.0)
MCV: 93.1 fL (ref 80.0–100.0)
Platelets: 231 10*3/uL (ref 150–400)
RBC: 4.8 MIL/uL (ref 4.22–5.81)
RDW: 13.8 % (ref 11.5–15.5)
WBC: 17.5 10*3/uL — ABNORMAL HIGH (ref 4.0–10.5)
nRBC: 0 % (ref 0.0–0.2)

## 2019-11-10 LAB — PROTIME-INR
INR: 1.5 — ABNORMAL HIGH (ref 0.8–1.2)
Prothrombin Time: 18.1 seconds — ABNORMAL HIGH (ref 11.4–15.2)

## 2019-11-10 MED ORDER — ERTAPENEM IV (FOR PTA / DISCHARGE USE ONLY): 1.0000 g | INTRAVENOUS | 0 refills | Status: DC

## 2019-11-10 MED ORDER — SODIUM CHLORIDE 0.9 % IV SOLN
1.0000 g | Freq: Once | INTRAVENOUS | Status: AC
  Administered 2019-11-10: 1 g via INTRAVENOUS
  Filled 2019-11-10: qty 1

## 2019-11-10 MED ORDER — SODIUM CHLORIDE 0.9 % IV SOLN
1.0000 g | Freq: Once | INTRAVENOUS | Status: AC
  Administered 2019-11-10: 1000 mg via INTRAVENOUS
  Filled 2019-11-10: qty 1

## 2019-11-10 NOTE — ED Notes (Signed)
Called [facility] for pt transport  

## 2019-11-10 NOTE — Discharge Planning (Signed)
[facility] consulted regarding home IV infusion for round of antibiotics.PT is from SNF that will be able to administer Rx after pt midline catheter placement.

## 2019-11-10 NOTE — ED Notes (Signed)
Atempted to call [name] [phone] no answer message left.

## 2019-11-10 NOTE — ED Notes (Signed)
[facility] called to come and get patient

## 2019-11-10 NOTE — Progress Notes (Signed)
PHARMACY CONSULT NOTE FOR:  OUTPATIENT  PARENTERAL ANTIBIOTIC THERAPY (OPAT)  Indication: complicated UTI with multiple allergies Regimen: ertapenem 1gm IV Q24H End date: 11/17/2019  IV antibiotic discharge orders are pended. To discharging provider:  please sign these orders via discharge navigator,  Select New Orders & click on the button choice - Manage This Unsigned Work.    Thank you for allowing pharmacy to be a part of this patient's care.  Gabriella Woodhead, Rande Lawman 11/10/2019, 10:50 AM

## 2019-11-10 NOTE — ED Notes (Signed)
Bladder scan 0ml

## 2019-11-10 NOTE — Discharge Instructions (Addendum)
Warren [name] has a urinary tract infection that is resistant to several classes of antibiotics including the ciprofloxacin he is currently on. He is to take IV invanz for one week. He had his first dose in the ER today and next dose is due on 11/11/19 for 1 week total of therapy. Midline catheter should be removed once he completes therapy.

## 2019-11-10 NOTE — ED Provider Notes (Signed)
[facility] EMERGENCY DEPARTMENT Provider Note   CSN: [id number] Arrival date & time: 11/09/19  2351     History Chief Complaint  Patient presents with  . Hematemesis / Penile Pain    Warren [name] is a 86 y.o. male.  HPI   86 year old male sent from nursing facility for evaluation of penile pain.  Patient has a past history of dementia and questionable historian.  He does endorse lower abdominal pain though.  He states that "it hurts from my rectum to my penis."  He did not verbalize any additional complaints.  Report of hematemesis as well.  Patient denies feeling nauseated to me.  He cannot remember if he vomited or coughed up blood.  He is on Xarelto.  Recent Foley catheter placement for retention.  Had to be placed by urology over a wire.  Past Medical History:  Diagnosis Date  . CAD (coronary artery disease)    moderate by catheterization,11/08...essentially unchanged by repeat study,10/11  . Carotid artery disease (HCC)     less than 50% stenosis bilaterally March 2013   . Claudication ([place])     ABIs within ABIs within normal limits, 40% left iliac disease by catheterization   . Colon polyps   . Depression   . Dizzy   . Edema extremities     secondary to severe varicose veins. Nonpitting edema and dermatitis.   . Emphysema   . Glaucoma   . Hyperlipidemia   . IBS (irritable bowel syndrome)   . Mitral regurgitation    mild  . Mixed dyslipidemia   . Peripheral edema   . Pneumonia   . Prostate cancer [facility])    s/p radiation treatment/s/p TURP  . Shortness of breath    Emphysema  . Thyroid nodule     posterior and inferior to the right lobepossibly outside of the gland secondary to parathyroid adenoma in addition multiple bilateral thyroid nodules     Patient Active Problem List   Diagnosis Date Noted  . DNR (do not resuscitate) 10/25/2019  . Acute respiratory failure with hypoxia ([place]) 10/24/2019  . Pneumonia due to COVID-19 virus  10/24/2019  . Barrett's esophagus 04/03/2018  . Candida esophagitis ([place]) 04/03/2018  . Anxiety 04/03/2018  . Esophageal dysfunction 03/23/2018  . Urinary tract infection associated with catheterization of urinary tract, initial encounter ([place]) 03/23/2018  . Leukocytosis 03/23/2018  . Dementia ([place]) 03/23/2018  . Dehydration   . Acute metabolic encephalopathy   . Nausea and vomiting 03/22/2018  . Pseudomonas urinary tract infection 03/06/2018  . Depression 03/06/2018  . Anxiety and depression 03/06/2018  . Osteoporosis 03/06/2018  . Acute lower UTI 03/01/2018  . Internal device, implant, or graft infection or inflammation 03/01/2018  . Emphysema   . Thyroid nodule   . Carotid artery disease ([place])   . CAD (coronary artery disease)   . Edema extremities   . Claudication ([place]) 07/30/2011  . Dyslipidemia 07/03/2010  . HYPERTENSION, BENIGN 07/03/2010  . PERIPHERAL EDEMA 07/03/2010  . SHORTNESS OF BREATH 07/03/2010  . PRECORDIAL PAIN 07/03/2010    Past Surgical History:  Procedure Laterality Date  . BIOPSY  03/24/2018   Procedure: BIOPSY;  Surgeon: Jackquline Denmark, MD;  Location: [facility] ENDOSCOPY;  Service: Endoscopy;;  . CARDIAC CATHETERIZATION  2008  . CHOLECYSTECTOMY    . CYSTOSCOPY N/A 02/27/2018   Procedure: CYSTOSCOPY;  Surgeon: Bjorn Loser, MD;  Location: [facility] ORS;  Service: Urology;  Laterality: N/A;  . ESOPHAGOGASTRODUODENOSCOPY (EGD) WITH PROPOFOL N/A 03/24/2018  Procedure: ESOPHAGOGASTRODUODENOSCOPY (EGD) WITH PROPOFOL;  Surgeon: Jackquline Denmark, MD;  Location: [facility] END[facility]COPY;  Service: Endoscopy;  Laterality: N/A;  . INCONTINENCE SURGERY     ring inserted  . MALONEY DILATION  03/24/2018   Procedure: Venia Minks DILATION;  Surgeon: Jackquline Denmark, MD;  Location: [facility] ENDOS[facility];  Service: Endoscopy;;  . TONSILLECTOMY    . TRANSURETHRAL RESECTION OF PROSTATE     has stress urinary incontinence  . URINARY SPHINCTER IMPLANT N/A 02/27/2018   Procedure: REMOVAL INFECTED ARTIFICIAL  URINARY SPHINCTER;  Surgeon: Bjorn Loser, MD;  Location: [facility] ORS;  Service: Urology;  Laterality: N/A;       Family History  Problem Relation Age of Onset  . Heart attack Sister        in her 27's  . Other Mother        lived to be 98  . Pancreatic cancer Father   . Crohn's disease Son   . Colon cancer Neg Hx     Social History   Tobacco Use  . Smoking status: Former Smoker    Packs/day: 1.00    Years: 40.00    Pack years: 40.00    Types: Cigarettes    Quit date: 11/04/1983    Years since quitting: 36.0  . Smokeless tobacco: Never Used  Substance Use Topics  . Alcohol use: No  . Drug use: No    Home Medications Prior to Admission medications   Medication Sig Start Date End Date Taking? Authorizing Provider  alendronate (FOSAMAX) 70 MG tablet Take 70 mg by mouth every Wednesday. Take with a full glass of water on an empty stomach.    [provider]  ascorbic acid (VITAMIN C) 500 MG tablet Take 500 mg by mouth daily.    [provider]  calcium carbonate (OS-CAL - DOSED IN MG OF ELEMENTAL CALCIUM) 1250 (500 Ca) MG tablet Take 1 tablet by mouth 2 (two) times daily.    [provider]  Cholecalciferol 50 MCG (2000 UT) TABS Take 2,000 Units by mouth daily.     [provider]  citalopram (CELEXA) 20 MG tablet Take 20 mg by mouth daily.     [provider]  fluticasone (FLONASE) 50 MCG/ACT nasal spray Place 1 spray into both nostrils 2 (two) times daily. 10/28/19   Arrien, Jimmy Picket, MD  guaiFENesin (MUCINEX) 600 MG 12 hr tablet Take 600 mg by mouth 2 (two) times daily.    [provider]  guaiFENesin-dextromethorphan (ROBITUSSIN DM) 100-10 MG/5ML syrup Take 5 mLs by mouth every 4 (four) hours as needed for cough. 10/28/19   Arrien, Jimmy Picket, MD  LORazepam (ATIVAN) 0.5 MG tablet Take 0.5-1 tablets (0.25-0.5 mg total) by mouth at bedtime as needed for anxiety or sleep. 10/28/19   Arrien, Jimmy Picket, MD    menthol-cetylpyridinium (CEPACOL) 3 MG lozenge Take 1 lozenge (3 mg total) by mouth every 4 (four) hours as needed for sore throat. 10/28/19   Arrien, Jimmy Picket, MD  mirabegron ER (MYRBETRIQ) 25 MG TB24 tablet Take 25 mg by mouth daily.    [provider]  Nutritional Supplements (NUTRITIONAL SUPPLEMENT PO) Take 120 mLs by mouth 2 (two) times daily with a meal. MedPass 2.0    [provider]  omeprazole (PRILOSEC OTC) 20 MG tablet Take 20 mg by mouth 2 (two) times daily.    [provider]  ondansetron (ZOFRAN) 4 MG tablet Take 1 tablet (4 mg total) by mouth every 6 (six) hours as needed for nausea.  10/28/19   Arrien, Jimmy Picket, MD  oxybutynin (DITROPAN) 5 MG tablet Take 1 tablet (5 mg total) by mouth 2 (two) times daily. Patient not taking: Reported on 10/24/2019 03/25/18   Velvet Bathe, MD  pravastatin (PRAVACHOL) 40 MG tablet Take 40 mg by mouth at bedtime.     [provider]  Rivaroxaban 15 & 20 MG TBPK Follow package directions: Take one 15mg  tablet by mouth twice a day. On day 22, switch to one 20mg  tablet once a day. Take with food. 10/28/19   Arrien, Jimmy Picket, MD  sodium chloride (OCEAN) 0.65 % SOLN nasal spray Place 2 sprays into both nostrils 3 (three) times daily as needed for congestion.    [provider]  sulfamethoxazole-trimethoprim (BACTRIM DS) 800-160 MG tablet Take 1 tablet by mouth 2 (two) times daily for 7 days. 11/03/19 11/10/19  Corena Herter, PA-C  tamsulosin (FLOMAX) 0.4 MG CAPS capsule Take 0.4 mg by mouth daily.    [provider]  zinc sulfate 220 (50 Zn) MG capsule Take 220 mg by mouth 2 (two) times daily.    [provider]  atorvastatin (LIPITOR) 40 MG tablet Take 40 mg by mouth at bedtime.    01/27/12  [provider]  furosemide (LASIX) 20 MG tablet Take 1 tablet (20 mg total) by mouth daily. 07/30/11 01/27/12  Ezra Sites, MD    Allergies    Aspirin, Cephalexin, Latex,  and Penicillins  Review of Systems   Review of Systems   All systems reviewed and negative, other than as noted in HPI.  Physical Exam Updated Vital Signs BP 100/64 (BP Location: Right Arm)   Pulse 96   Temp 98.2 F (36.8 C) (Oral)   Resp 16   SpO2 94%   Physical Exam Vitals and nursing note reviewed.  Constitutional:      General: He is not in acute distress.    Appearance: He is well-developed.  HENT:     Head: Normocephalic and atraumatic.  Eyes:     General:        Right eye: No discharge.        Left eye: No discharge.     Conjunctiva/sclera: Conjunctivae normal.  Cardiovascular:     Rate and Rhythm: Normal rate and regular rhythm.     Heart sounds: Normal heart sounds. No murmur. No friction rub. No gallop.   Pulmonary:     Effort: Pulmonary effort is normal. No respiratory distress.     Breath sounds: Normal breath sounds.  Abdominal:     General: There is no distension.     Palpations: Abdomen is soft.     Tenderness: There is no abdominal tenderness.  Genitourinary:    Comments: Foley catheter in place.  Small amount of blood noted at urethral meatus.  Suprapubic tenderness.  Bladder is not feel palpably distended.  Urine is grossly purulent. Appears to be flowing fine.  Musculoskeletal:        General: No tenderness.     Cervical back: Neck supple.  Skin:    General: Skin is warm and dry.  Neurological:     Mental Status: He is alert.  Psychiatric:        Behavior: Behavior normal.        Thought Content: Thought content normal.    ED Results / Procedures / Treatments   Labs (all labs ordered are listed, but only abnormal results are displayed) Labs Reviewed  URINE CULTURE - Abnormal; Notable  for the following components:      Result Value   Culture   (*)    Value: >=100,000 COLONIES/mL GRAM NEGATIVE RODS IDENTIFICATION AND SUSCEPTIBILITIES TO FOLLOW Performed at [facility], [place] [address], [place], [place] [address]    All other  components within normal limits  URINALYSIS, ROUTINE W REFLEX MICROSCOPIC - Abnormal; Notable for the following components:   Color, Urine AMBER (*)    APPearance TURBID (*)    pH 9.0 (*)    Hgb urine dipstick MODERATE (*)    Protein, ur >=300 (*)    Nitrite POSITIVE (*)    Leukocytes,Ua LARGE (*)    RBC / HPF >50 (*)    WBC, UA >50 (*)    Bacteria, UA MANY (*)    Non Squamous Epithelial 0-5 (*)    All other components within normal limits  BASIC METABOLIC PANEL - Abnormal; Notable for the following components:   Sodium 133 (*)    Chloride 92 (*)    Glucose, Bld 129 (*)    All other components within normal limits  CBC - Abnormal; Notable for the following components:   WBC 17.5 (*)    All other components within normal limits  PROTIME-INR - Abnormal; Notable for the following components:   Prothrombin Time 18.1 (*)    INR 1.5 (*)    All other components within normal limits  CBC WITH DIFFERENTIAL/PLATELET - Abnormal; Notable for the following components:   WBC 11.6 (*)    Neutro Abs 9.7 (*)    Abs Immature Granulocytes 0.12 (*)    All other components within normal limits    EKG None  Radiology No results found.  Procedures Procedures (including critical care time)  Medications Ordered in ED Medications - No data to display  ED Course  I have reviewed the triage vital signs and the nursing notes.  Pertinent labs & imaging results that were available during my care of the patient were reviewed by me and considered in my medical decision making (see chart for details).    MDM Rules/Calculators/A&P                      86 year old male with suprapubic/penile pain.  He recently had catheter insertion by urology in the emergency room on 11/03/2019.  He had a UA/urine cultures done at that time.   Urine culture from 11/03/2019 showing Proteus mirabilis with resistances to ampicillin, ciprofloxacin, nitrofurantoin and trim-sulfa. The best I can tell from notes, he  was prescribed bactrim on 11/03/19 from the ED prior to these culture results. The MAR from his facility lists that he has been on ciprofloxacin with a start date of 11/02/19 and physician progress note from 11/07/19 stating "Continue Cipro as ordered for the UTI." Post ED visit note for positive culture follow-up on 11/08/19 stating that he was treated with trimethoprim-sulfamethoxazole and the organism is sensitive to the same. I do not see any other culture data after 11/03/19 though.  I suspect culture data was simply overlooked/misinterpreted. It does not appear that he has been appropriately treated for his UTI. He is complaining of penile/lower abdominal pain, has suprapubic tenderness and grossly purulent looking urine. He additionally has PCN allergy and also listed allergy to cephalexin. Oral options limited. Will discuss with pharmacy with regards to recommendation about antibiotic choice that can also be continued at his nursing facility.  Also report of hematemesis. Pt unable to give me additional information. NH  staff not clear on specifics either.  ED note from 11/03/19 mentions "maroon-colored emesis." He is on xarelto. H/H remains normal. No upper abdominal tenderness. Has been in the ED ~8 hours at this point so will repeat CBC.   8:03 AM Discussed with our pharmacists. Recommending meropenem 1g BID for 7d. Will dose here and see if NH can continue. Dementia limits history but he certainly seems symptomatic. Has leukocytosis of 17,000. He is afebrile though and non-toxic in appearance. I don't think he requires inpatient treatment at this time. Hematemesis? None observed in the ED. H/H has remained normal over the past week. A this point I would continue to observe. May need GI evaluation if persists or H/H significantly drops.   Midline line placed.  Will change antibiotics to Invanz for convenience of dosing.  I think he is appropriate to go back to his nursing facility.  Final Clinical  Impression(s) / ED Diagnoses Final diagnoses:  Urinary tract infection associated with indwelling urethral catheter, sequela    Rx / DC Orders ED Discharge Orders    None       Virgel Manifold, MD 11/11/19 1328

## 2019-11-18 LAB — URINE CULTURE: Culture: >=100000 — AB

## 2019-11-25 ENCOUNTER — Encounter (HOSPITAL_COMMUNITY): Payer: Self-pay | Admitting: Internal Medicine

## 2019-11-25 ENCOUNTER — Inpatient Hospital Stay (HOSPITAL_COMMUNITY)
Admission: EM | Admit: 2019-11-25 | Discharge: 2019-11-29 | DRG: 699 | Disposition: A | Discharge status: Skilled Nursing Facility | Payer: Medicare Other | Source: Skilled Nursing Facility | Attending: Internal Medicine | Admitting: Internal Medicine

## 2019-11-25 DIAGNOSIS — F0390 Unspecified dementia without behavioral disturbance: Secondary | ICD-10-CM | POA: Diagnosis present

## 2019-11-25 DIAGNOSIS — N3000 Acute cystitis without hematuria: Secondary | ICD-10-CM | POA: Diagnosis present

## 2019-11-25 DIAGNOSIS — Z9079 Acquired absence of other genital organ(s): Secondary | ICD-10-CM

## 2019-11-25 DIAGNOSIS — K222 Esophageal obstruction: Secondary | ICD-10-CM | POA: Diagnosis present

## 2019-11-25 DIAGNOSIS — Z87440 Personal history of urinary (tract) infections: Secondary | ICD-10-CM | POA: Diagnosis not present

## 2019-11-25 DIAGNOSIS — R1310 Dysphagia, unspecified: Secondary | ICD-10-CM | POA: Diagnosis present

## 2019-11-25 DIAGNOSIS — Z87891 Personal history of nicotine dependence: Secondary | ICD-10-CM

## 2019-11-25 DIAGNOSIS — Z66 Do not resuscitate: Secondary | ICD-10-CM | POA: Diagnosis present

## 2019-11-25 DIAGNOSIS — K228 Other specified diseases of esophagus: Secondary | ICD-10-CM | POA: Diagnosis present

## 2019-11-25 DIAGNOSIS — Z923 Personal history of irradiation: Secondary | ICD-10-CM | POA: Diagnosis not present

## 2019-11-25 DIAGNOSIS — T83518A Infection and inflammatory reaction due to other urinary catheter, initial encounter: Secondary | ICD-10-CM | POA: Diagnosis present

## 2019-11-25 DIAGNOSIS — E782 Mixed hyperlipidemia: Secondary | ICD-10-CM | POA: Diagnosis present

## 2019-11-25 DIAGNOSIS — I739 Peripheral vascular disease, unspecified: Secondary | ICD-10-CM | POA: Diagnosis present

## 2019-11-25 DIAGNOSIS — Z8616 Personal history of COVID-19: Secondary | ICD-10-CM

## 2019-11-25 DIAGNOSIS — Z960 Presence of urogenital implants: Secondary | ICD-10-CM | POA: Diagnosis not present

## 2019-11-25 DIAGNOSIS — R1319 Other dysphagia: Secondary | ICD-10-CM

## 2019-11-25 DIAGNOSIS — H409 Unspecified glaucoma: Secondary | ICD-10-CM | POA: Diagnosis present

## 2019-11-25 DIAGNOSIS — J9610 Chronic respiratory failure, unspecified whether with hypoxia or hypercapnia: Secondary | ICD-10-CM | POA: Diagnosis present

## 2019-11-25 DIAGNOSIS — F329 Major depressive disorder, single episode, unspecified: Secondary | ICD-10-CM | POA: Diagnosis present

## 2019-11-25 DIAGNOSIS — R070 Pain in throat: Secondary | ICD-10-CM | POA: Diagnosis not present

## 2019-11-25 DIAGNOSIS — J439 Emphysema, unspecified: Secondary | ICD-10-CM | POA: Diagnosis present

## 2019-11-25 DIAGNOSIS — K2100 Gastro-esophageal reflux disease with esophagitis, without bleeding: Secondary | ICD-10-CM | POA: Diagnosis present

## 2019-11-25 DIAGNOSIS — Z800 Family history of malignant neoplasm of digestive organs: Secondary | ICD-10-CM

## 2019-11-25 DIAGNOSIS — K589 Irritable bowel syndrome without diarrhea: Secondary | ICD-10-CM | POA: Diagnosis present

## 2019-11-25 DIAGNOSIS — Y846 Urinary catheterization as the cause of abnormal reaction of the patient, or of later complication, without mention of misadventure at the time of the procedure: Secondary | ICD-10-CM | POA: Diagnosis present

## 2019-11-25 DIAGNOSIS — D649 Anemia, unspecified: Secondary | ICD-10-CM | POA: Diagnosis present

## 2019-11-25 DIAGNOSIS — I2510 Atherosclerotic heart disease of native coronary artery without angina pectoris: Secondary | ICD-10-CM | POA: Diagnosis present

## 2019-11-25 DIAGNOSIS — E876 Hypokalemia: Secondary | ICD-10-CM | POA: Diagnosis present

## 2019-11-25 DIAGNOSIS — F419 Anxiety disorder, unspecified: Secondary | ICD-10-CM | POA: Diagnosis present

## 2019-11-25 DIAGNOSIS — K209 Esophagitis, unspecified without bleeding: Secondary | ICD-10-CM | POA: Diagnosis not present

## 2019-11-25 DIAGNOSIS — Z86718 Personal history of other venous thrombosis and embolism: Secondary | ICD-10-CM

## 2019-11-25 DIAGNOSIS — R1314 Dysphagia, pharyngoesophageal phase: Secondary | ICD-10-CM | POA: Diagnosis present

## 2019-11-25 DIAGNOSIS — I10 Essential (primary) hypertension: Secondary | ICD-10-CM | POA: Diagnosis present

## 2019-11-25 DIAGNOSIS — N393 Stress incontinence (female) (male): Secondary | ICD-10-CM | POA: Diagnosis present

## 2019-11-25 DIAGNOSIS — I959 Hypotension, unspecified: Secondary | ICD-10-CM | POA: Diagnosis not present

## 2019-11-25 DIAGNOSIS — I340 Nonrheumatic mitral (valve) insufficiency: Secondary | ICD-10-CM | POA: Diagnosis present

## 2019-11-25 DIAGNOSIS — Z8249 Family history of ischemic heart disease and other diseases of the circulatory system: Secondary | ICD-10-CM

## 2019-11-25 DIAGNOSIS — I6523 Occlusion and stenosis of bilateral carotid arteries: Secondary | ICD-10-CM | POA: Diagnosis present

## 2019-11-25 DIAGNOSIS — Z6823 Body mass index (BMI) 23.0-23.9, adult: Secondary | ICD-10-CM

## 2019-11-25 DIAGNOSIS — N390 Urinary tract infection, site not specified: Secondary | ICD-10-CM | POA: Diagnosis not present

## 2019-11-25 DIAGNOSIS — E785 Hyperlipidemia, unspecified: Secondary | ICD-10-CM | POA: Diagnosis not present

## 2019-11-25 DIAGNOSIS — Z9049 Acquired absence of other specified parts of digestive tract: Secondary | ICD-10-CM

## 2019-11-25 DIAGNOSIS — H9190 Unspecified hearing loss, unspecified ear: Secondary | ICD-10-CM | POA: Diagnosis present

## 2019-11-25 DIAGNOSIS — Z8546 Personal history of malignant neoplasm of prostate: Secondary | ICD-10-CM | POA: Diagnosis not present

## 2019-11-25 DIAGNOSIS — B964 Proteus (mirabilis) (morganii) as the cause of diseases classified elsewhere: Secondary | ICD-10-CM | POA: Diagnosis present

## 2019-11-25 DIAGNOSIS — J449 Chronic obstructive pulmonary disease, unspecified: Secondary | ICD-10-CM | POA: Diagnosis not present

## 2019-11-25 DIAGNOSIS — I4510 Unspecified right bundle-branch block: Secondary | ICD-10-CM | POA: Diagnosis present

## 2019-11-25 DIAGNOSIS — Z7901 Long term (current) use of anticoagulants: Secondary | ICD-10-CM

## 2019-11-25 DIAGNOSIS — I6529 Occlusion and stenosis of unspecified carotid artery: Secondary | ICD-10-CM | POA: Diagnosis not present

## 2019-11-25 DIAGNOSIS — Z86010 Personal history of colonic polyps: Secondary | ICD-10-CM | POA: Diagnosis not present

## 2019-11-25 DIAGNOSIS — M810 Age-related osteoporosis without current pathological fracture: Secondary | ICD-10-CM | POA: Diagnosis present

## 2019-11-25 DIAGNOSIS — R627 Adult failure to thrive: Secondary | ICD-10-CM | POA: Diagnosis present

## 2019-11-25 LAB — CBC WITH DIFFERENTIAL/PLATELET
Abs Immature Granulocytes: 0.17 10*3/uL — ABNORMAL HIGH (ref 0.00–0.07)
Basophils Absolute: 0 10*3/uL (ref 0.0–0.1)
Basophils Relative: 0 %
Eosinophils Absolute: 0 10*3/uL (ref 0.0–0.5)
Eosinophils Relative: 0 %
HCT: 37.5 % — ABNORMAL LOW (ref 39.0–52.0)
Hemoglobin: 11.7 g/dL — ABNORMAL LOW (ref 13.0–17.0)
Immature Granulocytes: 1 %
Lymphocytes Relative: 4 %
Lymphs Abs: 0.5 10*3/uL — ABNORMAL LOW (ref 0.7–4.0)
MCH: 29.8 pg (ref 26.0–34.0)
MCHC: 31.2 g/dL (ref 30.0–36.0)
MCV: 95.7 fL (ref 80.0–100.0)
Monocytes Absolute: 0.9 10*3/uL (ref 0.1–1.0)
Monocytes Relative: 7 %
Neutro Abs: 11.4 10*3/uL — ABNORMAL HIGH (ref 1.7–7.7)
Neutrophils Relative %: 88 %
Platelets: 243 10*3/uL (ref 150–400)
RBC: 3.92 MIL/uL — ABNORMAL LOW (ref 4.22–5.81)
RDW: 14.6 % (ref 11.5–15.5)
WBC: 13 10*3/uL — ABNORMAL HIGH (ref 4.0–10.5)
nRBC: 0 % (ref 0.0–0.2)

## 2019-11-25 LAB — BASIC METABOLIC PANEL
Anion gap: 9 (ref 5–15)
BUN: 13 mg/dL (ref 8–23)
CO2: 34 mmol/L — ABNORMAL HIGH (ref 22–32)
Calcium: 7.6 mg/dL — ABNORMAL LOW (ref 8.9–10.3)
Chloride: 98 mmol/L (ref 98–111)
Creatinine, Ser: 0.64 mg/dL (ref 0.61–1.24)
GFR calc Af Amer: >60 mL/min (ref >60)
GFR calc non Af Amer: >60 mL/min (ref >60)
Glucose, Bld: 119 mg/dL — ABNORMAL HIGH (ref 70–99)
Potassium: 3 mmol/L — ABNORMAL LOW (ref 3.5–5.1)
Sodium: 141 mmol/L (ref 135–145)

## 2019-11-25 LAB — PHOSPHORUS: Phosphorus: 1.2 mg/dL — ABNORMAL LOW (ref 2.5–4.6)

## 2019-11-25 LAB — URINALYSIS, ROUTINE W REFLEX MICROSCOPIC
Bilirubin Urine: NEGATIVE
Glucose, UA: NEGATIVE mg/dL
Ketones, ur: 20 mg/dL — AB
Nitrite: POSITIVE — AB
Protein, ur: 100 mg/dL — AB
Specific Gravity, Urine: 1.025 (ref 1.005–1.030)
WBC, UA: >50 WBC/hpf — ABNORMAL HIGH (ref 0–5)
pH: 7 (ref 5.0–8.0)

## 2019-11-25 LAB — MAGNESIUM: Magnesium: 1.9 mg/dL (ref 1.7–2.4)

## 2019-11-25 MED ORDER — SODIUM CHLORIDE 0.9 % IV BOLUS
1000.0000 mL | Freq: Once | INTRAVENOUS | Status: AC
  Administered 2019-11-25: 1000 mL via INTRAVENOUS

## 2019-11-25 MED ORDER — FLUCONAZOLE IN SODIUM CHLORIDE 200-0.9 MG/100ML-% IV SOLN
200.0000 mg | INTRAVENOUS | Status: DC
  Administered 2019-11-25 – 2019-11-28 (×4): 200 mg via INTRAVENOUS
  Filled 2019-11-25 (×5): qty 100

## 2019-11-25 MED ORDER — POTASSIUM CHLORIDE 10 MEQ/100ML IV SOLN
10.0000 meq | INTRAVENOUS | Status: AC
  Administered 2019-11-25 (×2): 10 meq via INTRAVENOUS
  Filled 2019-11-25 (×2): qty 100

## 2019-11-25 MED ORDER — SODIUM CHLORIDE 0.9 % IV BOLUS: 1000.0000 mL | Freq: Once | INTRAVENOUS | Status: DC

## 2019-11-25 MED ORDER — LORAZEPAM 2 MG/ML IJ SOLN: 0.2500 mg | Freq: Every evening | INTRAMUSCULAR | Status: DC | PRN

## 2019-11-25 MED ORDER — SODIUM CHLORIDE 0.9 % IV SOLN
1.0000 g | INTRAVENOUS | Status: DC
  Administered 2019-11-25 – 2019-11-29 (×5): 1 g via INTRAVENOUS
  Filled 2019-11-25: qty 1
  Filled 2019-11-25 (×2): qty 10
  Filled 2019-11-25 (×3): qty 1

## 2019-11-25 MED ORDER — SODIUM CHLORIDE 0.9 % IV SOLN: INTRAVENOUS | Status: DC

## 2019-11-25 MED ORDER — PANTOPRAZOLE SODIUM 40 MG IV SOLR
40.0000 mg | INTRAVENOUS | Status: DC
  Administered 2019-11-25 – 2019-11-27 (×3): 40 mg via INTRAVENOUS
  Filled 2019-11-25 (×3): qty 40

## 2019-11-25 NOTE — ED Provider Notes (Addendum)
[facility] EMERGENCY DEPARTMENT Provider Note   CSN: [id number] Arrival date & time: 11/25/19  1146     History Chief Complaint  Patient presents with  . Failure To Thrive  . Hypotension   Warren [name] is a 86 y.o. male with history of dementia, CAD, depression, emphysema, chronic respiratory failure, RLE DVT on rivaroxaban, GERD, dysphagia with esophageal strictures s/p dilation 03/2018, esophageal ulcers, Barrett's esophagus brought to ER from [place] home for evaluation of decreased appetite, inability to swallow and low blood pressure. Patient is HOH.  States he hast not been eating. States he has no appetite and when he eats he has pain in his throat and food comes back up.  He also reports some pain in lower abdomen.  Denies chest pain, SOB.  Level 5 caveat due to history of dementia, although patient is HOH provides adequate history.  States he would like me to call his son or daughter.   Obtained collateral information from Lendon Collar and RN at living facility.  State patient has not eaten or kept anything down in the last 4-5 days.  He has gotten some IVF at facility per son.  Patient has not been able to eat in last 4 days.  He is spitting up and gurgling throughout the day.  Patient has long history of swallowing issues, last dilation one year ago.  Family/son have decided to pursue repeat EGD and dilation if needed to give patient chance to eat again.  Patient is on Xarelto, last dose 1/21 per facility paperwork at bedside.    HPI     Past Medical History:  Diagnosis Date  . CAD (coronary artery disease)    moderate by catheterization,11/08...essentially unchanged by repeat study,10/11  . Carotid artery disease (HCC)     less than 50% stenosis bilaterally March 2013   . Claudication ([place])     ABIs within ABIs within normal limits, 40% left iliac disease by catheterization   . Colon polyps   . Depression   . Dizzy   . Edema  extremities     secondary to severe varicose veins. Nonpitting edema and dermatitis.   . Emphysema   . Glaucoma   . Hyperlipidemia   . IBS (irritable bowel syndrome)   . Mitral regurgitation    mild  . Mixed dyslipidemia   . Peripheral edema   . Pneumonia   . Prostate cancer [facility])    s/p radiation treatment/s/p TURP  . Shortness of breath    Emphysema  . Thyroid nodule     posterior and inferior to the right lobepossibly outside of the gland secondary to parathyroid adenoma in addition multiple bilateral thyroid nodules     Patient Active Problem List   Diagnosis Date Noted  . DNR (do not resuscitate) 10/25/2019  . Acute respiratory failure with hypoxia ([place]) 10/24/2019  . Pneumonia due to COVID-19 virus 10/24/2019  . Barrett's esophagus 04/03/2018  . Candida esophagitis ([place]) 04/03/2018  . Anxiety 04/03/2018  . Esophageal dysfunction 03/23/2018  . Urinary tract infection associated with catheterization of urinary tract, initial encounter ([place]) 03/23/2018  . Leukocytosis 03/23/2018  . Dementia ([place]) 03/23/2018  . Dehydration   . Acute metabolic encephalopathy   . Nausea and vomiting 03/22/2018  . Pseudomonas urinary tract infection 03/06/2018  . Depression 03/06/2018  . Anxiety and depression 03/06/2018  . Osteoporosis 03/06/2018  . Acute lower UTI 03/01/2018  . Internal device, implant, or graft infection or inflammation 03/01/2018  .  Emphysema   . Thyroid nodule   . Carotid artery disease ([place])   . CAD (coronary artery disease)   . Edema extremities   . Claudication ([place]) 07/30/2011  . Dyslipidemia 07/03/2010  . HYPERTENSION, BENIGN 07/03/2010  . PERIPHERAL EDEMA 07/03/2010  . SHORTNESS OF BREATH 07/03/2010  . PRECORDIAL PAIN 07/03/2010    Past Surgical History:  Procedure Laterality Date  . BIOPSY  03/24/2018   Procedure: BIOPSY;  Surgeon: Jackquline Denmark, MD;  [facility];  Service: Endoscopy;;  . CARDIAC CATHETERIZATION  2008  . CHOLECYSTECTOMY     . CYSTOSCOPY N/A 02/27/2018   Procedure: CYSTOSCOPY;  Surgeon: Bjorn Loser, MD;  Location[facility];  Service: Urology;  Laterality: N/A;  . ESOPHAGOGASTRODUODENOSCOPY (EGD) WITH PROPOFOL N/A 03/24/2018   Procedure: ESOPHAGOGASTRODUODENOSCOPY (EGD) WITH PROPOFOL;  Surgeon: Jackquline Denmark, MD;  [facility];  Service: Endoscopy;  Laterality: N/A;  . INCONTINENCE SURGERY     ring inserted  . MALONEY DILATION  03/24/2018   Procedure: Venia Minks DILATION;  Surgeon: Jackquline Denmark, MD;  [facility];  Service: Endoscopy;;  . TONSILLECTOMY    . TRANSURETHRAL RESECTION OF PROSTATE     has stress urinary incontinence  . URINARY SPHINCTER IMPLANT N/A 02/27/2018   Procedure: REMOVAL INFECTED ARTIFICIAL URINARY SPHINCTER;  Surgeon: Bjorn Loser, MD;  Location[facility];  Service: Urology;  Laterality: N/A;       Family History  Problem Relation Age of Onset  . Heart attack Sister        in her 16's  . Other Mother        lived to be 6  . Pancreatic cancer Father   . Crohn's disease Son   . Colon cancer Neg Hx     Social History   Tobacco Use  . Smoking status: Former Smoker    Packs/day: 1.00    Years: 40.00    Pack years: 40.00    Types: Cigarettes    Quit date: 11/04/1983    Years since quitting: 36.0  . Smokeless tobacco: Never Used  Substance Use Topics  . Alcohol use: No  . Drug use: No    Home Medications Prior to Admission medications   Medication Sig Start Date End Date Taking? Authorizing Provider  acetaminophen (TYLENOL) 500 MG tablet Take 1,000 mg by mouth every 8 (eight) hours as needed for mild pain.    [provider]  alendronate (FOSAMAX) 70 MG tablet Take 70 mg by mouth every Wednesday. Take with a full glass of water on an empty stomach.    [provider]  ascorbic acid (VITAMIN C) 500 MG tablet Take 500 mg by mouth daily.    [provider]  calcium carbonate (OS-CAL - DOSED IN MG OF ELEMENTAL CALCIUM) 1250 (500  Ca) MG tablet Take 1 tablet by mouth 2 (two) times daily.    [provider]  Cholecalciferol 50 MCG (2000 UT) TABS Take 2,000 Units by mouth daily.     [provider]  ciprofloxacin (CIPRO) 500 MG tablet Take 500 mg by mouth 2 (two) times daily. X 10 days start on 11-02-19    [provider]  citalopram (CELEXA) 20 MG tablet Take 20 mg by mouth daily.     [provider]  ertapenem (INVANZ) IVPB Inject 1 g into the vein daily. Start on 11/11/19 11/10/19   Virgel Manifold, MD  fluticasone [facility]) 50 MCG/ACT nasal spray Place 1 spray into both nostrils 2 (two) times daily. 10/28/19  Arrien, Jimmy Picket, MD  guaiFENesin-codeine [facility]) 100-10 MG/5ML syrup Take 5 mLs by mouth every 4 (four) hours as needed for cough.    [provider]  guaiFENesin-dextromethorphan (ROBITUSSIN DM) 100-10 MG/5ML syrup Take 5 mLs by mouth every 4 (four) hours as needed for cough. 10/28/19   Arrien, Jimmy Picket, MD  HYDROcodone-acetaminophen (NORCO/VICODIN) 5-325 MG tablet Take 1 tablet by mouth 3 (three) times daily as needed for moderate pain.    [provider]  Lactobacillus Acid-Pectin (ACIDOPHILUS/CITRUS PECTIN) TABS Take 1 tablet by mouth 2 (two) times daily. X 14 days, start on 11-02-19    [provider]  LORazepam (ATIVAN) 0.5 MG tablet Take 0.5-1 tablets (0.25-0.5 mg total) by mouth at bedtime as needed for anxiety or sleep. 10/28/19   Arrien, Jimmy Picket, MD  menthol-cetylpyridinium (CEPACOL) 3 MG lozenge Take 1 lozenge (3 mg total) by mouth every 4 (four) hours as needed for sore throat. 10/28/19   Arrien, Jimmy Picket, MD  mirabegron ER (MYRBETRIQ) 25 MG TB24 tablet Take 25 mg by mouth daily.    [provider]  Nutritional Supplements (NUTRITIONAL SUPPLEMENT PO) Take 120 mLs by mouth 2 (two) times daily with a meal. MedPass 2.0    [provider]  omeprazole (PRILOSEC OTC) 20 MG tablet Take 20 mg by mouth 2  (two) times daily.    [provider]  ondansetron (ZOFRAN) 4 MG tablet Take 1 tablet (4 mg total) by mouth every 6 (six) hours as needed for nausea. 10/28/19   Arrien, Jimmy Picket, MD  oxybutynin (DITROPAN) 5 MG tablet Take 1 tablet (5 mg total) by mouth 2 (two) times daily. 03/25/18   Velvet Bathe, MD  pravastatin (PRAVACHOL) 40 MG tablet Take 40 mg by mouth at bedtime.     [provider]  Rivaroxaban 15 & 20 MG TBPK Follow package directions: Take one 15mg  tablet by mouth twice a day. On day 22, switch to one 20mg  tablet once a day. Take with food. 10/28/19   Arrien, Jimmy Picket, MD  sodium chloride (OCEAN) 0.65 % SOLN nasal spray Place 2 sprays into both nostrils 3 (three) times daily as needed for congestion.    [provider]  tamsulosin (FLOMAX) 0.4 MG CAPS capsule Take 0.4 mg by mouth daily.    [provider]  atorvastatin (LIPITOR) 40 MG tablet Take 40 mg by mouth at bedtime.    01/27/12  [provider]  furosemide (LASIX) 20 MG tablet Take 1 tablet (20 mg total) by mouth daily. 07/30/11 01/27/12  Ezra Sites, MD    Allergies    Aspirin, Cephalexin, Latex, and Penicillins  Review of Systems   Review of Systems  HENT: Positive for trouble swallowing.   All other systems reviewed and are negative.   Physical Exam Updated Vital Signs BP (!) 88/60   Pulse 73   Temp 97.6 F (36.4 C) (Oral)   Resp 19   SpO2 100%   Physical Exam Vitals and nursing note reviewed.  Constitutional:      General: He is not in acute distress.    Appearance: He is well-developed.     Comments: Awake, non toxic. Alert. Pleasant. HOH.   HENT:     Head: Normocephalic and atraumatic.     Right Ear: External ear normal.     Left Ear: External ear normal.     Nose: Nose normal.     Mouth/Throat:     Mouth: Mucous membranes are dry.  Comments: Dry lips but MMM. Oropharynx and tonsils normal. Tolerating secretions, occasionally  gurgling/coughing up saliva/phlegm  Eyes:     General: No scleral icterus.    Conjunctiva/sclera: Conjunctivae normal.  Cardiovascular:     Rate and Rhythm: Normal rate and regular rhythm.     Heart sounds: Normal heart sounds.  Pulmonary:     Effort: Pulmonary effort is normal.     Breath sounds: Normal breath sounds.  Abdominal:     Palpations: Abdomen is soft.     Tenderness: There is abdominal tenderness.     Comments: Mild suprapubic tenderness. No G/R/R. Active BS to lower quadrants  Genitourinary:    Comments: Foley in place  Musculoskeletal:        General: Normal range of motion.     Cervical back: Normal range of motion and neck supple.  Skin:    General: Skin is warm and dry.     Capillary Refill: Capillary refill takes less than 2 seconds.  Neurological:     Mental Status: He is alert.     Comments: Oriented to full name, hospital. Provides adequate history. HOH. Speech fluent.   Psychiatric:        Behavior: Behavior normal.        Thought Content: Thought content normal.        Judgment: Judgment normal.     ED Results / Procedures / Treatments   Labs (all labs ordered are listed, but only abnormal results are displayed) Labs Reviewed  CBC WITH DIFFERENTIAL/PLATELET - Abnormal; Notable for the following components:      Result Value   WBC 13.0 (*)    RBC 3.92 (*)    Hemoglobin 11.7 (*)    HCT 37.5 (*)    Neutro Abs 11.4 (*)    Lymphs Abs 0.5 (*)    Abs Immature Granulocytes 0.17 (*)    All other components within normal limits  BASIC METABOLIC PANEL - Abnormal; Notable for the following components:   Potassium 3.0 (*)    CO2 34 (*)    Glucose, Bld 119 (*)    Calcium 7.6 (*)    All other components within normal limits  URINALYSIS, ROUTINE W REFLEX MICROSCOPIC - Abnormal; Notable for the following components:   Color, Urine AMBER (*)    APPearance TURBID (*)    Hgb urine dipstick MODERATE (*)    Ketones, ur 20 (*)    Protein, ur 100 (*)     Nitrite POSITIVE (*)    Leukocytes,Ua MODERATE (*)    WBC, UA >50 (*)    Bacteria, UA RARE (*)    All other components within normal limits  URINE CULTURE  MAGNESIUM    EKG None  Radiology No results found.  Procedures Procedures (including critical care time)  Medications Ordered in ED Medications  potassium chloride 10 mEq in 100 mL IVPB (10 mEq Intravenous New Bag/Given 11/25/19 1506)  sodium chloride 0.9 % bolus 1,000 mL (has no administration in time range)  sodium chloride 0.9 % bolus 1,000 mL (1,000 mLs Intravenous New Bag/Given 11/25/19 1237)    ED Course  I have reviewed the triage vital signs and the nursing notes.  Pertinent labs & imaging results that were available during my care of the patient were reviewed by me and considered in my medical decision making (see chart for details).  Clinical Course as of Nov 24 1530  Fri Nov 25, 2019  1246 Called [facility] DON unable to contact her, left  voicemail.  Called RN at facility states pt has not been able to swallow for at least 2 days.  Grandaughter made an appointment with GI but GI clinic called facility and told them they would not be able to do anything for patient in clinic and recommended ER evaluation to DON.  Family was called and son requested patient be sent to ER for evaluation.  H/o strictures. RN states patient tries to eat but complains of pain and foods comes back up   [CG]  1306 Spoke at length with son at bedside. Family has decided to give patient a chance to eat, they are interested and want to pursue repeat dilation here with GI.  They are considering feeding tube as absolutely last resort.    [CG]  1316 WBC(!): 13.0 [CG]  1317 Hemoglobin(!): 11.7 [CG]  1317 NEUT#(!): 11.4 [CG]  1317 Potassium(!): 3.0 [CG]  1317 Glucose(!): 119 [CG]  1317 CO2(!): 34 [CG]    Clinical Course User Index [CG] Arlean Hopping   MDM Rules/Calculators/A&P                      EMR reviewed   Obtained collateral information from son and facility DON and RN  Labs, UA, IVF ordered  ER work up personally reviewed  Hypokalemia, normal Mag.  Mild leukocytosis possibly related to dehydration.  +Nitrites on UA with suprapubic tenderness in setting of chronic foley and recently treated UTI with ertapenem.    Pt re-evaluated.  SBP soft, fluctuating SBP 88104 with MAPS > 65. mentating well, no distress on re-evaluation.  Occasional gurgling/spitting but mostly tolerating secretions here.   1 L IVF given, 2nd L ordered.  Pharmacy consulted to assist with antibiotic selection for UTI  Spoke to GI PA Chester Holstein regarding family interested in EGD/dilation.  GI PA who is at bedside.    Spoke to IM MD who will admit patient  Updated patient and son, aware of admission and GI consult pending their recommendations. Son hopeful pt will be able to have EGD/dilation this admission. Briefly discussed risks, alternatives, final decision per medicine/GI/anesthesia. Patient on xarelto last dose seems to be on 1/21. COVID positive 10/25/2020.   Final Clinical Impression(s) / ED Diagnoses Final diagnoses:  Esophageal dysphagia  Hypokalemia  Acute cystitis without hematuria    Rx / DC Orders ED Discharge Orders    None         Arlean Hopping 11/25/19 1533    Dorie Rank, MD 11/26/19 [phone]

## 2019-11-25 NOTE — H&P (Addendum)
Date: 11/25/2019               Patient Name:  Warren [name] MRN: [id number]  DOB: Oct 03, 1922 Age / Sex: 86 y.o., male   PCP: System, Pcp Not In         Medical Service: Internal Medicine Teaching Service         Attending Physician: Dr. Dorie Rank, MD    First Contact: Sheppard Coil, MD, Mitzi Hansen Pager: [place] [phone])  Second Contact: Eileen Stanford, MD, [place] Pager: OA [phone])       After Hours (After 5p/  First Contact Pager: [phone]  weekends / holidays): Second Contact Pager: [phone]   Chief Complaint: Dysphagia, Odynophagia   History of Present Illness:  Patient has a history of dementia, history obtained via chart review and son who was at the bedside.  Mr. Orlandi is a 86 year old gentleman with a past medical history significant for dementia, CAD s/p cardiac cath in 2008, carotid stenosis, PVD, COPD, COVID pneumonia in late December, glaucoma, HLD, prostate cancer status post radiation and TURP procedure, Barrett's esophagus, and chronic intermittent dysphagia secondary to esophageal stenosis (dilated in  May 2019) who presents today with a 4 to 5-day history of decreased p.o. intake. The patient has a longstanding history of issues with swallowing. He presented from [place] home after they were concerned about his swallowing and also hypotension. Per patient's son, he has also experienced odynophagia with and without food. The son also reports that he has had chronic UTIs that has been difficult to treat. This is his 3rd time presenting to the hospital with urinary complaints. He has received IV antibiotics at his nursing facility as well. He currently complains of burning, rash near his scrotum. The patient has had a foley catheter for several weeks now.   In the ED, patient had a leukocytosis to 13.0 with left shift, BMP notable for hypokalemia to 3.0 and a urinalysis with moderate hemoglobin, ketones, protein, positive nitrites moderate leukocytes,  and rare bacteria. In the ED, the patient received 2 L of normal saline.  Meds:  No outpatient medications have been marked as taking for the 11/25/19 encounter [facility] Encounter).   Allergies: Allergies as of 11/25/2019 - Review Complete 11/25/2019  Allergen Reaction Noted  . Aspirin Other (See Comments)   . Cephalexin Other (See Comments) 08/19/2018  . Latex Other (See Comments) 02/26/2018  . Penicillins  02/26/2018   Past Medical History:  Diagnosis Date  . CAD (coronary artery disease)    moderate by catheterization,11/08...essentially unchanged by repeat study,10/11  . Carotid artery disease (HCC)     less than 50% stenosis bilaterally March 2013   . Claudication ([place])     ABIs within ABIs within normal limits, 40% left iliac disease by catheterization   . Colon polyps   . Depression   . Dizzy   . Edema extremities     secondary to severe varicose veins. Nonpitting edema and dermatitis.   . Emphysema   . Glaucoma   . Hyperlipidemia   . IBS (irritable bowel syndrome)   . Mitral regurgitation    mild  . Mixed dyslipidemia   . Peripheral edema   . Pneumonia   . Prostate cancer [facility])    s/p radiation treatment/s/p TURP  . Shortness of breath    Emphysema  . Thyroid nodule     posterior and inferior to the right lobepossibly outside of the gland secondary to parathyroid adenoma in addition multiple bilateral thyroid  nodules    Past Surgical History:  Procedure Laterality Date  . BIOPSY  03/24/2018   Procedure: BIOPSY;  Surgeon: Jackquline Denmark, MD;  [facility];  Service: Endoscopy;;  . CARDIAC CATHETERIZATION  2008  . CHOLECYSTECTOMY    . CYSTOSCOPY N/A 02/27/2018   Procedure: CYSTOSCOPY;  Surgeon: Bjorn Loser, MD;  [facility];  Service: Urology;  Laterality: N/A;  . ESOPHAGOGASTRODUODENOSCOPY (EGD) WITH PROPOFOL N/A 03/24/2018   Procedure: ESOPHAGOGASTRODUODENOSCOPY (EGD) WITH PROPOFOL;  Surgeon: Jackquline Denmark, MD;  [facility];   Service: Endoscopy;  Laterality: N/A;  . INCONTINENCE SURGERY     ring inserted  . MALONEY DILATION  03/24/2018   Procedure: Venia Minks DILATION;  Surgeon: Jackquline Denmark, MD;  [facility];  Service: Endoscopy;;  . TONSILLECTOMY    . TRANSURETHRAL RESECTION OF PROSTATE     has stress urinary incontinence  . URINARY SPHINCTER IMPLANT N/A 02/27/2018   Procedure: REMOVAL INFECTED ARTIFICIAL URINARY SPHINCTER;  Surgeon: Bjorn Loser, MD;  [facility];  Service: Urology;  Laterality: N/A;   Family History:  Family History  Problem Relation Age of Onset  . Heart attack Sister        in her 70's  . Other Mother        lived to be 20  . Pancreatic cancer Father   . Crohn's disease Son   . Colon cancer Neg Hx     Social History:  Social History   Tobacco Use  . Smoking status: Former Smoker    Packs/day: 1.00    Years: 40.00    Pack years: 40.00    Types: Cigarettes    Quit date: 11/04/1983    Years since quitting: 36.0  . Smokeless tobacco: Never Used  Substance Use Topics  . Alcohol use: No  . Drug use: No    Review of Systems: A complete ROS was negative except as per HPI.   Imaging: None  Physical Exam: Blood pressure (!) 88/60, pulse 73, temperature 97.6 F (36.4 C), temperature source Oral, resp. rate 19, SpO2 100 %.  Physical Exam Vitals reviewed.  Constitutional:      General: He is not in acute distress.    Appearance: Normal appearance. He is normal weight. He is not ill-appearing, toxic-appearing or diaphoretic.  HENT:     Head: Normocephalic and atraumatic.     Comments: [place] in place Eyes:     General: No scleral icterus.       Right eye: No discharge.        Left eye: No discharge.     Extraocular Movements: Extraocular movements intact.  Cardiovascular:     Rate and Rhythm: Normal rate and regular rhythm.     Pulses: Normal pulses.     Heart sounds: Normal heart sounds. No murmur. No friction rub. No gallop.   Pulmonary:     Effort:  Pulmonary effort is normal. No respiratory distress.     Breath sounds: Normal breath sounds. No wheezing or rales.  Abdominal:     General: Bowel sounds are normal. There is no distension.     Palpations: Abdomen is soft.     Tenderness: There is no abdominal tenderness. There is no guarding.  Genitourinary:    Comments: Suprapubic tenderness to palpation  Foley in place Musculoskeletal:     Right lower leg: No edema.     Left lower leg: No edema.  Skin:    General: Skin is warm.  Neurological:  General: No focal deficit present.     Mental Status: He is alert. Mental status is at baseline.  Psychiatric:        Mood and Affect: Mood normal.    Assessment & Plan by Problem: Active Problems:   Dysphagia  Mr. Shelhamer is a 86 year old gentleman with a past medical history significant for dementia, CAD s/p cardiac cath in 2008, carotid stenosis, PVD, COPD, Covid pneumonia in late December, glaucoma, HLD, prostate cancer status post radiation and TURP procedure, Barrett's esophagus, and chronic intermittent dysphagia secondary to esophageal stenosis (dilated in  May 2019) who presents today with a 4 to 5-day history of decreased p.o. intake in addition to recurrent UTI.  #HxEsophageal Strictures #Hx Barrett's Esophagus #Hx Presbyesophagus #Dysphagia #Throat pain: Acute onset of odynophagia/dysphagia is concerning for potential infection. He has has a history of Candida infections in his esophagus before, and recent antibiotic use could raise the risk for recurrence.  GI has seen the patient and we appreciate their assistance.  -Appreciate GI recommendations  -D/c home Xarelto today. Last dose was yesterday at the nursing home  -Plan for EGD tomorrow afternoon after patient's been off of Xarelto for 36 hours  -IV fluconazole 200 mg daily  -IV Protonix 40 mg daily  #Recurent UTI: Patient's had recurrent UTIs over the past month. Last urine culture on 12/31 and 1/7 grew Proteus  mirabilis. Patient currently has urinalysis consistent with UTI complains of suprapubic tenderness.  Patient has had a indwelling Foley for the past 2 weeks due to urinary incontinence.  It is unclear if the patient completed treatment for his UTI.  After his ED visit on 12/31, there are mixed notes stating that he took Bactrim and ciprofloxacin.  After his visit on 1/7, the notes state that he took meropenem for 7 days. -Replace indwelling Foley with condom catheter -Start ceftriaxone 1g q24hrs -Daily CBC -F/u repeat urine cultures  #History of dementia: Mental status is currently at baseline.  Son states that he tends to get more confused later in the day, but generally mostly oriented. -Delirium precautions -Redirect as needed -Ativan 0.25 mg PRN  #FEN/GI #Hypokalemia -Diet: NPO -Fluids: NS 100 cc/hr -Replete K+  #Hx DVT #DVT prophylaxis: Patient has been taking Xarelto for DVT developed after being hospitalized for COVID-19 infection at the end of December. -SCDs  #CODE STATUS: DNR  #Dispo: Admit patient to Inpatient with expected length of stay greater than 2 midnights. Prior to Admission Living Arrangement:  [place] home Anticipated Discharge Location:  [facility] nursing home Barriers to Discharge: Pending medical work-up  Signed: Earlene Plater, MD Internal Medicine, PGY1 Pager: [phone]  11/25/2019,3:20 PM

## 2019-11-25 NOTE — ED Triage Notes (Signed)
Pt BIB [facility] from [facility] w/ c/o loss of appetite, esophageal strictures, hypotension ongoing for 2 days. Per EMS, BP was 105/80, pt received 1500 mL NS. Pt hx of dementia, 2L [place] at baseline, PICC line prior to arrival.

## 2019-11-25 NOTE — Consult Note (Addendum)
Referring Provider:  [facility]         Primary Care Physician:  System, Pcp Not In Primary Gastroenterologist:    Dr. Lyndel Safe          We were asked to see this patient for:     dysphagia             ASSESSMENT /  PLAN    86 yo male with pmh significant for but no limited to prostate cancer, hyperlipidemia, emphysema, glaucoma, CAD, DVT.   1. Chronic intermittent dysphagia , Known presbyesophagus, possible Barrett's esophagus (endoscopic appearance on May 2019 EGD but not confirmed on path due to inflammation at the time). Also on May 2019 EGD ,candida infection of esophagus and mild esophageal stenosis s/p dilation.  Here from nursing home now with poor po intake for days, odynophagia and reports of regurgitation of any PO.  -- DDx :  Onset and progressive nature of odynophagia / dysphagia raises concern for infection. He has recently been on antibiotics for UTI so recurrent candida infection is possible. Takes Fosamax which has been associated with esophageal ulcers / erosions. . Also, atypical squamous mucosa seen on esophageal path in May 2019 so neoplasm possible. Could just have benign stenosis or worsening dysmotility.  -- Will arrange for EGD tomorrow, will have been off Xarelto about 36 hours by then. The risks and benefits of EGD were discussed with patient's son who agrees to proceed.  --IV PPI daily in the interim.  --will give dose of IV diflucan today. If no evidence for candida on EGD tomorrow then can discontinue.  --IV hydration --Son asked the Dr. Collene Mares call him tomorrow with EGD findings.   2. Hypokalemia in setting of poor PO intake -Repletion per admitting team  3. Leukocytosis, possible secondary to UTI --Management per admitting team  4. Hx of DVT, on Xarelto. Last dose was yesterday according to Nursing Home record  5. Mild [place] anemia. No reports of any overt GI bleeding  6. Hard of hearing      [facility] Attending   I have taken an interval  history, reviewed the chart and examined the patient. I agree with the Advanced Practitioner's note, impression and recommendations.   Needs EGD to sort out things - ? Developing achalasia, ? Candida, other infection, also on alendronate which should be stopped given dysmotility and unlikely to make a difference at his age  Gatha Mayer, MD, [facility] 11/25/2019 5:02 PM     HPI:    Chief Complaint: swallowing problems.   Warren [name] is a 86 y.o. male with longstanding history of dysphagia. He has presbyesophgus, hx of candida infection of esophagus and most recent EGD in May 2019 showed Barrett's and esophageal stenosis (treated with dilation)   Patient in ED, from Nursing facility. He apparently has had very little PO intake over the last few days. History comes from chart and patient's son. Patient seems oriented today but has periods of confusion per son. He is extremely HOH.   As far as son knows patient was eating and drinking fine until a week ago. Has complained of pain in throat when swallowing since then . He hasn't eaten much over last few days. Son told by nursing home that whatever patient eats he immediately vomits / regurgitates. Son endorses a several pound weight loss over the last few weeks. He has been battling a UTI with indwelling foley cath. Son unaware of any other GI issues going  on with patient.   Previous GI evaluation :   EGD 03/24/18 for dysphagia and dilated esophagus on CT scan Non-bleeding esophageal ulcer. Biopsied. - Barrett's esophagus. Biopsied. - Esophageal stenosis. Dilated. - Gastritis. Diagnosis 1. Esophagus, biopsy, mid, ulcer - BENIGN SQUAMOUS MUCOSA WITH ACUTE INFLAMMATION - NECROINFLAMMATORY DEBRIS - SEE COMMENT 2. Esophagus, biopsy, distal - ATYPICAL SQUAMOUS MUCOSA WITH ACUTE AND CHRONIC INFLAMMATION - NECROINFLAMMATORY DEBRIS - BUDDING YEAST PRESENT ON PAS STAIN - SEE COMMENT   Past Medical History:    Diagnosis Date  . CAD (coronary artery disease)    moderate by catheterization,11/08...essentially unchanged by repeat study,10/11  . Carotid artery disease (HCC)     less than 50% stenosis bilaterally March 2013   . Claudication ([place])     ABIs within ABIs within normal limits, 40% left iliac disease by catheterization   . Colon polyps   . Depression   . Edema extremities     secondary to severe varicose veins. Nonpitting edema and dermatitis.   . Emphysema   . Glaucoma   . Hyperlipidemia   . IBS (irritable bowel syndrome)   . Mitral regurgitation    mild  . Mixed dyslipidemia   . Pneumonia   . Prostate cancer [facility])    s/p radiation treatment/s/p TURP  . Shortness of breath    Emphysema  . Thyroid nodule     posterior and inferior to the right lobepossibly outside of the gland secondary to parathyroid adenoma in addition multiple bilateral thyroid nodules     Past Surgical History:  Procedure Laterality Date  . BIOPSY  03/24/2018   Procedure: BIOPSY;  Surgeon: Jackquline Denmark, MD;  Location: [facility];  Service: Endoscopy;;  . CARDIAC CATHETERIZATION  2008  . CHOLECYSTECTOMY    . CYSTOSCOPY N/A 02/27/2018   Procedure: CYSTOSCOPY;  Surgeon: Bjorn Loser, MD;  Location: [facility];  Service: Urology;  Laterality: N/A;  . ESOPHAGOGASTRODUODENOSCOPY (EGD) WITH PROPOFOL N/A 03/24/2018   Procedure: ESOPHAGOGASTRODUODENOSCOPY (EGD) WITH PROPOFOL;  Surgeon: Jackquline Denmark, MD;  Location: [facility];  Service: Endoscopy;  Laterality: N/A;  . INCONTINENCE SURGERY     ring inserted  . MALONEY DILATION  03/24/2018   Procedure: Venia Minks DILATION;  Surgeon: Jackquline Denmark, MD;  Location: [facility];  Service: Endoscopy;;  . TONSILLECTOMY    . TRANSURETHRAL RESECTION OF PROSTATE     has stress urinary incontinence  . URINARY SPHINCTER IMPLANT N/A 02/27/2018   Procedure: REMOVAL INFECTED ARTIFICIAL URINARY SPHINCTER;  Surgeon: Bjorn Loser, MD;  Location: [facility];  Service: Urology;   Laterality: N/A;    Prior to Admission medications   Medication Sig Start Date End Date Taking? Authorizing Provider  acetaminophen (TYLENOL) 500 MG tablet Take 1,000 mg by mouth every 8 (eight) hours as needed for mild pain.    [provider]  alendronate (FOSAMAX) 70 MG tablet Take 70 mg by mouth every Wednesday. Take with a full glass of water on an empty stomach.    [provider]  ascorbic acid (VITAMIN C) 500 MG tablet Take 500 mg by mouth daily.    [provider]  calcium carbonate (OS-CAL - DOSED IN MG OF ELEMENTAL CALCIUM) 1250 (500 Ca) MG tablet Take 1 tablet by mouth 2 (two) times daily.    [provider]  Cholecalciferol 50 MCG (2000 UT) TABS Take 2,000 Units by mouth daily.     [provider]  ciprofloxacin (CIPRO) 500 MG tablet Take 500 mg by mouth 2 (two) times daily. X 10  days start on 11-02-19    [provider]  citalopram (CELEXA) 20 MG tablet Take 20 mg by mouth daily.     [provider]  ertapenem (INVANZ) IVPB Inject 1 g into the vein daily. Start on 11/11/19 11/10/19   Virgel Manifold, MD  fluticasone [facility]) 50 MCG/ACT nasal spray Place 1 spray into both nostrils 2 (two) times daily. 10/28/19   Arrien, Jimmy Picket, MD  guaiFENesin-codeine [facility]) 100-10 MG/5ML syrup Take 5 mLs by mouth every 4 (four) hours as needed for cough.    [provider]  guaiFENesin-dextromethorphan (ROBITUSSIN DM) 100-10 MG/5ML syrup Take 5 mLs by mouth every 4 (four) hours as needed for cough. 10/28/19   Arrien, Jimmy Picket, MD  HYDROcodone-acetaminophen (NORCO/VICODIN) 5-325 MG tablet Take 1 tablet by mouth 3 (three) times daily as needed for moderate pain.    [provider]  Lactobacillus Acid-Pectin (ACIDOPHILUS/CITRUS PECTIN) TABS Take 1 tablet by mouth 2 (two) times daily. X 14 days, start on 11-02-19    [provider]  LORazepam (ATIVAN) 0.5 MG tablet Take 0.5-1 tablets (0.25-0.5 mg  total) by mouth at bedtime as needed for anxiety or sleep. 10/28/19   Arrien, Jimmy Picket, MD  menthol-cetylpyridinium (CEPACOL) 3 MG lozenge Take 1 lozenge (3 mg total) by mouth every 4 (four) hours as needed for sore throat. 10/28/19   Arrien, Jimmy Picket, MD  mirabegron ER (MYRBETRIQ) 25 MG TB24 tablet Take 25 mg by mouth daily.    [provider]  Nutritional Supplements (NUTRITIONAL SUPPLEMENT PO) Take 120 mLs by mouth 2 (two) times daily with a meal. MedPass 2.0    [provider]  omeprazole (PRILOSEC OTC) 20 MG tablet Take 20 mg by mouth 2 (two) times daily.    [provider]  ondansetron (ZOFRAN) 4 MG tablet Take 1 tablet (4 mg total) by mouth every 6 (six) hours as needed for nausea. 10/28/19   Arrien, Jimmy Picket, MD  oxybutynin (DITROPAN) 5 MG tablet Take 1 tablet (5 mg total) by mouth 2 (two) times daily. 03/25/18   Velvet Bathe, MD  pravastatin (PRAVACHOL) 40 MG tablet Take 40 mg by mouth at bedtime.     [provider]  Rivaroxaban 15 & 20 MG TBPK Follow package directions: Take one 15mg  tablet by mouth twice a day. On day 22, switch to one 20mg  tablet once a day. Take with food. 10/28/19   Arrien, Jimmy Picket, MD  sodium chloride (OCEAN) 0.65 % SOLN nasal spray Place 2 sprays into both nostrils 3 (three) times daily as needed for congestion.    [provider]  tamsulosin (FLOMAX) 0.4 MG CAPS capsule Take 0.4 mg by mouth daily.    [provider]  atorvastatin (LIPITOR) 40 MG tablet Take 40 mg by mouth at bedtime.    01/27/12  [provider]  furosemide (LASIX) 20 MG tablet Take 1 tablet (20 mg total) by mouth daily. 07/30/11 01/27/12  de Stanford Scotland, MD    Current Facility-Administered Medications  Medication Dose Route Frequency Provider Last Rate Last Admin  . potassium chloride 10 mEq in 100 mL IVPB  10 mEq Intravenous Q1 Hr x 2 Kinnie Feil, PA-C 100 mL/hr at 11/25/19 1506 10 mEq at 11/25/19 1506    Current Outpatient Medications  Medication Sig Dispense Refill  . acetaminophen (TYLENOL) 500 MG tablet Take 1,000 mg by mouth every 8 (eight) hours as needed for mild pain.    Marland Kitchen alendronate (FOSAMAX) 70 MG  tablet Take 70 mg by mouth every Wednesday. Take with a full glass of water on an empty stomach.    Marland Kitchen ascorbic acid (VITAMIN C) 500 MG tablet Take 500 mg by mouth daily.    . calcium carbonate (OS-CAL - DOSED IN MG OF ELEMENTAL CALCIUM) 1250 (500 Ca) MG tablet Take 1 tablet by mouth 2 (two) times daily.    . Cholecalciferol 50 MCG (2000 UT) TABS Take 2,000 Units by mouth daily.     . ciprofloxacin (CIPRO) 500 MG tablet Take 500 mg by mouth 2 (two) times daily. X 10 days start on 11-02-19    . citalopram (CELEXA) 20 MG tablet Take 20 mg by mouth daily.     . ertapenem (INVANZ) IVPB Inject 1 g into the vein daily. Start on 11/11/19 6 Units 0  . fluticasone (FLONASE) 50 MCG/ACT nasal spray Place 1 spray into both nostrils 2 (two) times daily. 16 g 0  . guaiFENesin-codeine (ROBITUSSIN AC) 100-10 MG/5ML syrup Take 5 mLs by mouth every 4 (four) hours as needed for cough.    Marland Kitchen guaiFENesin-dextromethorphan (ROBITUSSIN DM) 100-10 MG/5ML syrup Take 5 mLs by mouth every 4 (four) hours as needed for cough. 118 mL 0  . HYDROcodone-acetaminophen (NORCO/VICODIN) 5-325 MG tablet Take 1 tablet by mouth 3 (three) times daily as needed for moderate pain.    . Lactobacillus Acid-Pectin (ACIDOPHILUS/CITRUS PECTIN) TABS Take 1 tablet by mouth 2 (two) times daily. X 14 days, start on 11-02-19    . LORazepam (ATIVAN) 0.5 MG tablet Take 0.5-1 tablets (0.25-0.5 mg total) by mouth at bedtime as needed for anxiety or sleep. 10 tablet 0  . menthol-cetylpyridinium (CEPACOL) 3 MG lozenge Take 1 lozenge (3 mg total) by mouth every 4 (four) hours as needed for sore throat. 20 tablet 12  . mirabegron ER (MYRBETRIQ) 25 MG TB24 tablet Take 25 mg by mouth daily.    . Nutritional Supplements (NUTRITIONAL SUPPLEMENT PO) Take 120  mLs by mouth 2 (two) times daily with a meal. MedPass 2.0    . omeprazole (PRILOSEC OTC) 20 MG tablet Take 20 mg by mouth 2 (two) times daily.    . ondansetron (ZOFRAN) 4 MG tablet Take 1 tablet (4 mg total) by mouth every 6 (six) hours as needed for nausea. 20 tablet 0  . oxybutynin (DITROPAN) 5 MG tablet Take 1 tablet (5 mg total) by mouth 2 (two) times daily. 60 tablet 0  . pravastatin (PRAVACHOL) 40 MG tablet Take 40 mg by mouth at bedtime.     . Rivaroxaban 15 & 20 MG TBPK Follow package directions: Take one 15mg  tablet by mouth twice a day. On day 22, switch to one 20mg  tablet once a day. Take with food. 51 each 0  . sodium chloride (OCEAN) 0.65 % SOLN nasal spray Place 2 sprays into both nostrils 3 (three) times daily as needed for congestion.    . tamsulosin (FLOMAX) 0.4 MG CAPS capsule Take 0.4 mg by mouth daily.     Social History   Tobacco Use  . Smoking status: Former Smoker    Packs/day: 1.00    Years: 40.00    Pack years: 40.00    Types: Cigarettes    Quit date: 11/04/1983    Years since quitting: 36.0  . Smokeless tobacco: Never Used  Substance Use Topics  . Alcohol use: No  . Drug use: No   Social History   Social History Narrative   Has four children   World War II  Veteran/ he was in the [employer] and the [employer] during WWII     Allergies as of 11/25/2019 - Review Complete 11/25/2019  Allergen Reaction Noted  . Aspirin Other (See Comments)   . Cephalexin Other (See Comments) 08/19/2018  . Latex Other (See Comments) 02/26/2018  . Penicillins  02/26/2018    Family History  Problem Relation Age of Onset  . Heart attack Sister        in her 55's  . Other Mother        lived to be 84  . Pancreatic cancer Father   . Crohn's disease Son   . Colon cancer Neg Hx       Review of Systems: Limited review of systems. Patient extremely hard of hearing. Marland Kitchen  Physical Exam: Vital signs in last 24 hours: Temp:  [97.6 F (36.4 C)] 97.6 F (36.4 C) (01/22  1155) Pulse Rate:  [72-79] 72 (01/22 1330) Resp:  [13-20] 16 (01/22 1330) BP: (93-104)/(58-74) 102/65 (01/22 1330) SpO2:  [100 %] 100 % (01/22 1330)   General:   Alert, thin white male in NAD Psych:  Pleasant, cooperative. Normal mood and affect. Eyes:  Pupils equal, sclera clear, no icterus.   Conjunctiva pink. Ears:  Hard of hearing. . Nose:  No deformity, discharge,  or lesions. Neck:  Supple; no masses Lungs:  Clear throughout to auscultation.   No wheezes, crackles, or rhonchi.  Heart:  Regular rate and rhythm;  no lower extremity edema Abdomen:  Soft, non-distended, nontender, BS active, no palp mass   Rectal:  Deferred  Msk:  Symmetrical without gross deformities. . Neurologic:  Alert and  oriented x4;  grossly normal neurologically. Skin:  Intact without significant lesions or rashes.   Intake/Output from previous day: No intake/output data recorded. Intake/Output this shift: Total I/O In: 101.9 [IV Piggyback:101.9] Out: -   Lab Results: Recent Labs    11/25/19 1227  WBC 13.0*  HGB 11.7*  HCT 37.5*  PLT 243   BMET Recent Labs    11/25/19 1227  NA 141  K 3.0*  CL 98  CO2 34*  GLUCOSE 119*  BUN 13  CREATININE 0.64  CALCIUM 7.6*      . CBC Latest Ref Rng & Units 11/25/2019 11/10/2019 11/10/2019  WBC 4.0 - 10.5 K/uL 13.0(H) 11.6(H) 17.5(H)  Hemoglobin 13.0 - 17.0 g/dL 11.7(L) 13.1 14.5  Hematocrit 39.0 - 52.0 % 37.5(L) 40.7 44.7  Platelets 150 - 400 K/uL 243 189 231    . CMP Latest Ref Rng & Units 11/25/2019 11/10/2019 11/03/2019  Glucose 70 - 99 mg/dL 119(H) 129(H) 111(H)  BUN 8 - 23 mg/dL 13 20 19   Creatinine 0.61 - 1.24 mg/dL 0.64 1.03 1.04  Sodium 135 - 145 mmol/L 141 133(L) 140  Potassium 3.5 - 5.1 mmol/L 3.0(L) 4.2 4.4  Chloride 98 - 111 mmol/L 98 92(L) 99  CO2 22 - 32 mmol/L 34(H) 30 28  Calcium 8.9 - 10.3 mg/dL 7.6(L) 9.1 9.2  Total Protein 6.5 - 8.1 g/dL - - 6.3(L)  Total Bilirubin 0.3 - 1.2 mg/dL - - 1.2  Alkaline Phos 38 - 126 U/L - - 53   AST 15 - 41 U/L - - 18  ALT 0 - 44 U/L - - 12   Studies/Results: No results found.    Tye Savoy, NP-C @  11/25/2019, 3:07 PM

## 2019-11-25 NOTE — Progress Notes (Signed)
Pt arrived to room 6N21 via stretcher from the ED. Received report from Junita Push, RN. See assessment. Will continue to monitor.

## 2019-11-26 ENCOUNTER — Encounter (HOSPITAL_COMMUNITY)
Admission: EM | Disposition: A | Discharge status: Skilled Nursing Facility | Payer: Self-pay | Source: Skilled Nursing Facility | Attending: Internal Medicine

## 2019-11-26 ENCOUNTER — Inpatient Hospital Stay (HOSPITAL_COMMUNITY): Payer: Medicare Other | Admitting: Critical Care Medicine

## 2019-11-26 ENCOUNTER — Encounter (HOSPITAL_COMMUNITY): Payer: Self-pay | Admitting: Internal Medicine

## 2019-11-26 DIAGNOSIS — Z8546 Personal history of malignant neoplasm of prostate: Secondary | ICD-10-CM

## 2019-11-26 DIAGNOSIS — Z86718 Personal history of other venous thrombosis and embolism: Secondary | ICD-10-CM

## 2019-11-26 DIAGNOSIS — H409 Unspecified glaucoma: Secondary | ICD-10-CM

## 2019-11-26 DIAGNOSIS — F0390 Unspecified dementia without behavioral disturbance: Secondary | ICD-10-CM

## 2019-11-26 DIAGNOSIS — I959 Hypotension, unspecified: Secondary | ICD-10-CM

## 2019-11-26 DIAGNOSIS — Z8616 Personal history of COVID-19: Secondary | ICD-10-CM

## 2019-11-26 DIAGNOSIS — Z923 Personal history of irradiation: Secondary | ICD-10-CM

## 2019-11-26 DIAGNOSIS — I6529 Occlusion and stenosis of unspecified carotid artery: Secondary | ICD-10-CM

## 2019-11-26 DIAGNOSIS — Z66 Do not resuscitate: Secondary | ICD-10-CM

## 2019-11-26 DIAGNOSIS — Z8701 Personal history of pneumonia (recurrent): Secondary | ICD-10-CM

## 2019-11-26 DIAGNOSIS — Z7901 Long term (current) use of anticoagulants: Secondary | ICD-10-CM

## 2019-11-26 DIAGNOSIS — E785 Hyperlipidemia, unspecified: Secondary | ICD-10-CM

## 2019-11-26 DIAGNOSIS — Z87440 Personal history of urinary (tract) infections: Secondary | ICD-10-CM

## 2019-11-26 DIAGNOSIS — N390 Urinary tract infection, site not specified: Secondary | ICD-10-CM

## 2019-11-26 DIAGNOSIS — I739 Peripheral vascular disease, unspecified: Secondary | ICD-10-CM

## 2019-11-26 DIAGNOSIS — I2510 Atherosclerotic heart disease of native coronary artery without angina pectoris: Secondary | ICD-10-CM

## 2019-11-26 DIAGNOSIS — Z79899 Other long term (current) drug therapy: Secondary | ICD-10-CM

## 2019-11-26 DIAGNOSIS — N3000 Acute cystitis without hematuria: Secondary | ICD-10-CM

## 2019-11-26 DIAGNOSIS — E876 Hypokalemia: Secondary | ICD-10-CM

## 2019-11-26 DIAGNOSIS — Z8719 Personal history of other diseases of the digestive system: Secondary | ICD-10-CM

## 2019-11-26 DIAGNOSIS — Z960 Presence of urogenital implants: Secondary | ICD-10-CM

## 2019-11-26 DIAGNOSIS — J449 Chronic obstructive pulmonary disease, unspecified: Secondary | ICD-10-CM

## 2019-11-26 DIAGNOSIS — R070 Pain in throat: Secondary | ICD-10-CM

## 2019-11-26 HISTORY — PX: ESOPHAGOGASTRODUODENOSCOPY (EGD) WITH PROPOFOL: SHX5813

## 2019-11-26 HISTORY — PX: FOREIGN BODY REMOVAL: SHX962

## 2019-11-26 LAB — BASIC METABOLIC PANEL
Anion gap: 8 (ref 5–15)
BUN: 10 mg/dL (ref 8–23)
CO2: 29 mmol/L (ref 22–32)
Calcium: 7 mg/dL — ABNORMAL LOW (ref 8.9–10.3)
Chloride: 104 mmol/L (ref 98–111)
Creatinine, Ser: 0.62 mg/dL (ref 0.61–1.24)
GFR calc Af Amer: >60 mL/min (ref >60)
GFR calc non Af Amer: >60 mL/min (ref >60)
Glucose, Bld: 82 mg/dL (ref 70–99)
Potassium: 3.1 mmol/L — ABNORMAL LOW (ref 3.5–5.1)
Sodium: 141 mmol/L (ref 135–145)

## 2019-11-26 LAB — CBC
HCT: 34.1 % — ABNORMAL LOW (ref 39.0–52.0)
Hemoglobin: 10.6 g/dL — ABNORMAL LOW (ref 13.0–17.0)
MCH: 30 pg (ref 26.0–34.0)
MCHC: 31.1 g/dL (ref 30.0–36.0)
MCV: 96.6 fL (ref 80.0–100.0)
Platelets: 203 10*3/uL (ref 150–400)
RBC: 3.53 MIL/uL — ABNORMAL LOW (ref 4.22–5.81)
RDW: 14.7 % (ref 11.5–15.5)
WBC: 9.9 10*3/uL (ref 4.0–10.5)
nRBC: 0 % (ref 0.0–0.2)

## 2019-11-26 LAB — HEPARIN LEVEL (UNFRACTIONATED): Heparin Unfractionated: 1.12 IU/mL — ABNORMAL HIGH (ref 0.30–0.70)

## 2019-11-26 LAB — MAGNESIUM: Magnesium: 1.7 mg/dL (ref 1.7–2.4)

## 2019-11-26 SURGERY — ESOPHAGOGASTRODUODENOSCOPY (EGD) WITH PROPOFOL
Anesthesia: General

## 2019-11-26 MED ORDER — HEPARIN (PORCINE) 25000 UT/250ML-% IV SOLN
850.0000 [IU]/h | INTRAVENOUS | Status: DC
  Administered 2019-11-27: 950 [IU]/h via INTRAVENOUS
  Administered 2019-11-27: 850 [IU]/h via INTRAVENOUS
  Filled 2019-11-26: qty 250

## 2019-11-26 MED ORDER — HEPARIN (PORCINE) 25000 UT/250ML-% IV SOLN
1100.0000 [IU]/h | INTRAVENOUS | Status: DC
  Administered 2019-11-26: 1100 [IU]/h via INTRAVENOUS
  Filled 2019-11-26: qty 250

## 2019-11-26 MED ORDER — LACTATED RINGERS IV SOLN: INTRAVENOUS | Status: DC | PRN

## 2019-11-26 MED ORDER — SUCCINYLCHOLINE CHLORIDE 20 MG/ML IJ SOLN
INTRAMUSCULAR | Status: DC | PRN
  Administered 2019-11-26: 120 mg via INTRAVENOUS

## 2019-11-26 MED ORDER — HEPARIN BOLUS VIA INFUSION
3500.0000 [IU] | Freq: Once | INTRAVENOUS | Status: AC
  Administered 2019-11-26: 3500 [IU] via INTRAVENOUS
  Filled 2019-11-26: qty 3500

## 2019-11-26 MED ORDER — POTASSIUM CHLORIDE 10 MEQ/100ML IV SOLN
10.0000 meq | INTRAVENOUS | Status: AC
  Administered 2019-11-26 (×2): 10 meq via INTRAVENOUS
  Filled 2019-11-26: qty 100

## 2019-11-26 MED ORDER — PROPOFOL 500 MG/50ML IV EMUL
INTRAVENOUS | Status: DC | PRN
  Administered 2019-11-26: 100 ug/kg/min via INTRAVENOUS

## 2019-11-26 MED ORDER — PROPOFOL 10 MG/ML IV BOLUS
INTRAVENOUS | Status: DC | PRN
  Administered 2019-11-26: 20 mg via INTRAVENOUS
  Administered 2019-11-26: 80 mg via INTRAVENOUS

## 2019-11-26 MED ORDER — ONDANSETRON HCL 4 MG/2ML IJ SOLN
INTRAMUSCULAR | Status: DC | PRN
  Administered 2019-11-26: 4 mg via INTRAVENOUS

## 2019-11-26 MED ORDER — DEXTROSE IN LACTATED RINGERS 5 % IV SOLN: INTRAVENOUS | Status: DC

## 2019-11-26 MED ORDER — PHENYLEPHRINE HCL-NACL 10-0.9 MG/250ML-% IV SOLN
INTRAVENOUS | Status: DC | PRN
  Administered 2019-11-26: 15 ug/min via INTRAVENOUS

## 2019-11-26 MED ORDER — PHENYLEPHRINE 40 MCG/ML (10ML) SYRINGE FOR IV PUSH (FOR BLOOD PRESSURE SUPPORT)
PREFILLED_SYRINGE | INTRAVENOUS | Status: DC | PRN
  Administered 2019-11-26 (×2): 80 ug via INTRAVENOUS

## 2019-11-26 SURGICAL SUPPLY — 15 items

## 2019-11-26 NOTE — Transfer of Care (Signed)
Immediate Anesthesia Transfer of Care Note  Patient: Warren [name]  Procedure(s) Performed: ESOPHAGOGASTRODUODENOSCOPY (EGD) WITH PROPOFOL (N/A )  Patient Location: Endoscopy Unit  Anesthesia Type:General  Level of Consciousness: awake  Airway & Oxygen Therapy: Patient Spontanous Breathing and Patient connected to nasal cannula oxygen  Post-op Assessment: Report given to RN and Post -op Vital signs reviewed and stable  Post vital signs: Reviewed and stable  Last Vitals:  Vitals Value Taken Time  BP    Temp    Pulse    Resp    SpO2      Last Pain:  Vitals:   11/26/19 0708  TempSrc: Temporal  PainSc: 0-No pain         Complications: No apparent anesthesia complications

## 2019-11-26 NOTE — Progress Notes (Addendum)
Subjective:   Pt was seen at the bedside today after his EGD procedure.  Patient says he is feeling well after the procedure, but appears to be affected by the anesthesia.  No complaints at this time.  Objective: CBC Latest Ref Rng & Units 11/26/2019 11/25/2019 11/10/2019  WBC 4.0 - 10.5 K/uL 9.9 13.0(H) 11.6(H)  Hemoglobin 13.0 - 17.0 g/dL 10.6(L) 11.7(L) 13.1  Hematocrit 39.0 - 52.0 % 34.1(L) 37.5(L) 40.7  Platelets 150 - 400 K/uL 203 243 189   BMP Latest Ref Rng & Units 11/26/2019 11/25/2019 11/10/2019  Glucose 70 - 99 mg/dL 82 119(H) 129(H)  BUN 8 - 23 mg/dL 10 13 20   Creatinine 0.61 - 1.24 mg/dL 0.62 0.64 1.03  Sodium 135 - 145 mmol/L 141 141 133(L)  Potassium 3.5 - 5.1 mmol/L 3.1(L) 3.0(L) 4.2  Chloride 98 - 111 mmol/L 104 98 92(L)  CO2 22 - 32 mmol/L 29 34(H) 30  Calcium 8.9 - 10.3 mg/dL 7.0(L) 7.6(L) 9.1   Vital signs in last 24 hours: Vitals:   11/25/19 1700 11/25/19 1729 11/25/19 2056 11/26/19 0418  BP: 97/62 99/60 93/60  (!) 104/57  Pulse:  73 76 75  Resp: 16 19 19 19   Temp:  98.7 F (37.1 C) 98.9 F (37.2 C) 98.8 F (37.1 C)  TempSrc:  Axillary Oral Oral  SpO2:  100% 100% 100%   Physical Exam General: Resting in bed comfortably, NAD HEENT: NCAT, [place] in place CV: RRR, normal S1-S2 no murmurs rubs or gallops appreciated, no acute lower extremity edema PULM: To auscultation bilaterally, no crackles or wheezes appreciated ABD: Soft and nontender in all quadrants NEURO: Alert, nonfocal  Assessment/Plan:  Active Problems:   Dysphagia  Warren [name] is a 86 year old gentleman with a past medical history significant for dementia, CAD s/p cardiac cath in 2008, carotid stenosis, PVD, COPD, Covid pneumonia in late December, glaucoma, HLD, prostate cancer status post radiation and TURP procedure, Barrett's esophagus, and chronic intermittent dysphagia secondary to esophageal stenosis (dilated in  May 2019) who presents today with a 4 to 5-day history of decreased p.o. intake  in addition to recurrent UTI.  #HxEsophageal Strictures #Hx Barrett's Esophagus #Hx Presbyesophagus #Dysphagia #Throat pain:  EGD was performed on 1/23 with GI and with demonstrated dilation in the entire esophagus with solid food debris noted along the entire length. There was stenosis at the GEJ but this was not dilated. Son has been updated by GI. -Appreciate GI recommendations             -Repeat EGD on Monday, 1/25  -Continue holding home Xarelto, started heparin drip today             -IV fluconazole 200 mg daily             -IV Protonix 40 mg daily   #Recurent UTI: Patient's had recurrent UTIs over the past month. Last urine culture on 12/31 and 1/7 grew Proteus mirabilis. Patient currently has urinalysis consistent with UTI complains of suprapubic tenderness.  Patient has had a indwelling Foley for the past 2 weeks due to urinary incontinence. It is unclear if the patient completed treatment for his UTI.  After his ED visit on 12/31, there are mixed notes stating that he took Bactrim and ciprofloxacin.  After his visit on 1/7, the notes state that he took meropenem for 7 days. -Replaced indwelling Foley with condom catheter on HOD1 -Continue ceftriaxone 1g q24hrs -Daily CBC -F/u repeat urine cultures, currently pending  #History of dementia: Mental  status is currently at baseline. Son states that he tends to get more confused later in the day, but generally mostly oriented. -Delirium precautions -Redirect as needed -Ativan 0.25 mg PRN  #FEN/GI #Hypokalemia: 3.1 on BMP this AM -Diet: NPO -Fluids: D5 LR 100 cc/hr -Replete K+  #Hx DVT #DVT prophylaxis: Patient has been taking Xarelto for DVT developed after being hospitalized for COVID-19 infection at the end of December. -Start heparin drip today  #CODE STATUS: DNR  #Dispo:  Prior to Admission Living Arrangement: [place] home Anticipated Discharge Location: [facility] Barriers to  Discharge: Pending medical work-up, EGD scheduled for Monday  Warren Plater, MD Internal Medicine, PGY1 Pager: [phone]  11/26/2019,6:52 AM

## 2019-11-26 NOTE — Progress Notes (Signed)
ANTICOAGULATION CONSULT NOTE - Follow Up Consult  Pharmacy Consult for heparin Indication: Recent DVT  Allergies  Allergen Reactions  . Aspirin Other (See Comments)    Reaction - spots  . Cephalexin Other (See Comments)    Reported by [facility] 08/19/2018  . Latex Other (See Comments)    Unknown reaction  . Penicillins     Has patient had a PCN reaction causing immediate rash, facial/tongue/throat swelling, SOB or lightheadedness with hypotension:No Has patient had a PCN reaction causing severe rash involving mucus membranes or skin necrosis: No Has patient had a PCN reaction that required hospitalization:NoHas patient had a PCN reaction occurring within the last 10 years:No If all of the above answers are "NO", then may proceed with Cephalosporin use.     Patient Measurements: Height: 5\' 7"  (170.2 cm) Weight: 149 lb 4 oz (67.7 kg) IBW/kg (Calculated) : 66.1 Heparin Dosing Weight: 67.7 kg  Vital Signs: Temp: 98.2 F (36.8 C) (01/23 2143) Temp Source: Oral (01/23 2143) BP: 108/67 (01/23 2143) Pulse Rate: 67 (01/23 2143)  Labs: Recent Labs    11/25/19 1227 11/26/19 0237  HGB 11.7* 10.6*  HCT 37.5* 34.1*  PLT 243 203  CREATININE 0.64 0.62    Estimated Creatinine Clearance: 51.6 mL/min (by C-G formula based on SCr of 0.62 mg/dL).   Medical History: Past Medical History:  Diagnosis Date  . CAD (coronary artery disease)    moderate by catheterization,11/08...essentially unchanged by repeat study,10/11  . Carotid artery disease (HCC)     less than 50% stenosis bilaterally March 2013   . Claudication ([place])     ABIs within ABIs within normal limits, 40% left iliac disease by catheterization   . Colon polyps   . COVID-19 10/2019  . Depression   . Dizzy   . Edema extremities     secondary to severe varicose veins. Nonpitting edema and dermatitis.   . Emphysema   . Glaucoma   . Hyperlipidemia   . IBS (irritable bowel syndrome)   . Mitral regurgitation     mild  . Mixed dyslipidemia   . Peripheral edema   . Pneumonia   . Prostate cancer [facility])    s/p radiation treatment/s/p TURP  . Shortness of breath    Emphysema  . Thyroid nodule     posterior and inferior to the right lobepossibly outside of the gland secondary to parathyroid adenoma in addition multiple bilateral thyroid nodules     Medications:  Scheduled:  . pantoprazole (PROTONIX) IV  40 mg Intravenous Q24H   Infusions:  . cefTRIAXone (ROCEPHIN)  IV 1 g (11/26/19 1724)  . dextrose 5% lactated ringers Stopped (11/26/19 1724)  . fluconazole (DIFLUCAN) IV Stopped (11/26/19 1717)  . heparin 1,100 Units/hr (11/26/19 1724)    Assessment: 89 yoM admitted from NH on 1/22 for dysphagia and odynophagia. Pt was on Xarelto PTA for recent DVT d/t COVID (diagnosed 10/27/2019), last dose taken 1/21. Xarelto has been held since then. Pt is s/p EGD and removal of food bolus this AM. Pharmacy consulted to dose heparin for bridging while awaiting dilation procedure on Monday.  Hgb is down 10.6, platelet count is wnl. Renal function is stable, SCr 0.62.   Heparin level is supra-therapeutic at 1.12 on 1100 units/hr  Goal of Therapy:  Heparin level 0.3-0.7 units/ml Monitor platelets by anticoagulation protocol: Yes   Plan:  Hold heparin infusion x 1 hour Then restart heparin infusion at 950 units/hr Will check aPTT with next heparin level in 8  hours given recent DOAC use Monitor daily heparin level, CBC, and s/sx of bleeding F/u resuming Xarelto post-procedure  Vertis Kelch, PharmD, [facility] PGY2 Cardiology Pharmacy Resident Phone [phone] 11/26/2019       10:13 PM  Please check [email or web address] for unit-specific pharmacist phone numbers

## 2019-11-26 NOTE — Progress Notes (Signed)
Spoke with phlebotomy.  They are on their way to draw Heparin labs.  Tele [phone].

## 2019-11-26 NOTE — Anesthesia Procedure Notes (Signed)
Procedure Name: Intubation Date/Time: 11/26/2019 7:58 AM Performed by: Clearnce Sorrel, CRNA Pre-anesthesia Checklist: Patient identified, Emergency Drugs available, Suction available, Patient being monitored and Timeout performed Patient Re-evaluated:Patient Re-evaluated prior to induction Oxygen Delivery Method: Circle system utilized Preoxygenation: Pre-oxygenation with 100% oxygen Induction Type: IV induction, Rapid sequence and Cricoid Pressure applied Laryngoscope Size: Mac and 4 Grade View: Grade I Tube type: Oral Tube size: 7.5 mm Number of attempts: 1 Airway Equipment and Method: Stylet Placement Confirmation: ETT inserted through vocal cords under direct vision,  breath sounds checked- equal and bilateral and positive ETCO2 Secured at: 23 cm Tube secured with: Tape Dental Injury: Teeth and Oropharynx as per pre-operative assessment

## 2019-11-26 NOTE — Anesthesia Preprocedure Evaluation (Signed)
Anesthesia Evaluation  Patient identified by MRN, date of birth, ID band Patient awake    Reviewed: Allergy & Precautions, NPO status , Patient's Chart, lab work & pertinent test results  History of Anesthesia Complications (+) PONV  Airway Mallampati: II  TM Distance: >3 FB Neck ROM: Full    Dental  (+) Dental Advisory Given, Edentulous Upper, Edentulous Lower   Pulmonary shortness of breath, pneumonia, COPD, former smoker,    breath sounds clear to auscultation  (-) stridor     Cardiovascular hypertension, + CAD and + Peripheral Vascular Disease   Rhythm:Regular Rate:Normal     Neuro/Psych PSYCHIATRIC DISORDERS Anxiety Depression Dementia negative neurological ROS     GI/Hepatic negative GI ROS, Neg liver ROS,   Endo/Other  negative endocrine ROS  Renal/GU negative Renal ROS     Musculoskeletal   Abdominal   Peds  Hematology negative hematology ROS (+)   Anesthesia Other Findings   Reproductive/Obstetrics                             Lab Results  Component Value Date   WBC 9.9 11/26/2019   HGB 10.6 (L) 11/26/2019   HCT 34.1 (L) 11/26/2019   MCV 96.6 11/26/2019   PLT 203 11/26/2019   Lab Results  Component Value Date   CREATININE 0.62 11/26/2019   BUN 10 11/26/2019   NA 141 11/26/2019   K 3.1 (L) 11/26/2019   CL 104 11/26/2019   CO2 29 11/26/2019    Anesthesia Physical  Anesthesia Plan  ASA: III  Anesthesia Plan: MAC   Post-op Pain Management:    Induction: Intravenous  PONV Risk Score and Plan: 2 and Ondansetron, Dexamethasone and Treatment may vary due to age or medical condition  Airway Management Planned:   Additional Equipment:   Intra-op Plan:   Post-operative Plan:   Informed Consent: I have reviewed the patients History and Physical, chart, labs and discussed the procedure including the risks, benefits and alternatives for the proposed anesthesia with  the patient or authorized representative who has indicated his/her understanding and acceptance.     Dental advisory given  Plan Discussed with: CRNA and Anesthesiologist  Anesthesia Plan Comments:         Anesthesia Quick Evaluation

## 2019-11-26 NOTE — Progress Notes (Signed)
  Date: 11/26/2019  Patient name: Warren [name]  Medical record number: [id number]  Date of birth: 17-Sep-1922   I have seen and evaluated Warren [name] and discussed their care with the Residency Team. Briefly, [name] is a 86 year old man who presented with hypotension, dysphagia, odynophagia and urinary complaints, found to be due to a UTI with foley catheter in place.  GI was consulted and patient due to undergo EGD.    Vitals:   11/26/19 0851 11/26/19 0927  BP: (!) 118/57 (!) 109/59  Pulse: 72 71  Resp: 15 16  Temp:  (!) 97.5 F (36.4 C)  SpO2: 100% 97%   General: Lying in bed, hard of hearing, very mildly confused, no complaints Eyes: Anicteric sclerae CV: RR, NR, no murmur Pulm: Breathing comfortably, no wheezing Abd: +BS, Soft MSK: Normal muscle tone for age Psych: pleasant, mildly confused.   Assessment and Plan: I have seen and evaluated the patient as outlined above. I agree with the formulated Assessment and Plan as detailed in the residents' note, with the following changes:   1. Dysphagia, Odynophagia, H/O Barret's esophagus and strictures - Taken to Endoscopy today, please see Dr. Lorie Apley report - Will remain NPO - Trial for dilation on Monday - IVF to support patient for any insensible losses  2. UTI, recurrent - Condom catheter - Rocephin 1gm q 24 hours - Follow cultures  Other issues per Dr. Redgie Grayer note.   Sid Falcon, MD 1/23/202110:04 AM

## 2019-11-26 NOTE — Progress Notes (Signed)
Subjective: Patient in endo lab for an EGD with possible dilation.  Patient's chart reviewed and risks and benefits of the procedure were discussed with his son to him over the phone.  Objective: Vital signs in last 24 hours: Temp:  [97.6 F (36.4 C)-98.9 F (37.2 C)] 97.7 F (36.5 C) (01/23 0708) Pulse Rate:  [72-79] 78 (01/23 0708) Resp:  [13-22] 21 (01/23 0708) BP: (88-107)/(57-74) 107/59 (01/23 0708) SpO2:  [100 %] 100 % (01/23 0708) Weight:  [67.7 kg] 67.7 kg (01/23 0708) Last BM Date: (UTA)  Intake/Output from previous day: 01/22 0701 - 01/23 0700 In: 2143.4 [I.V.:535.3; IV Piggyback:1608.1] Out: 201 [Urine:200; Stool:1] Intake/Output this shift: No intake/output data recorded.  General appearance: alert, cooperative and no distress Resp: clear to auscultation bilaterally Cardio: regular rate and rhythm, S1, S2 normal, no murmur, click, rub or gallop GI: soft, non-tender; bowel sounds normal; no masses,  no organomegaly Extremities: extremities normal, atraumatic, no cyanosis or edema  Lab Results: Recent Labs    11/25/19 1227 11/26/19 0237  WBC 13.0* 9.9  HGB 11.7* 10.6*  HCT 37.5* 34.1*  PLT 243 203   BMET Recent Labs    11/25/19 1227 11/26/19 0237  NA 141 141  K 3.0* 3.1*  CL 98 104  CO2 34* 29  GLUCOSE 119* 82  BUN 13 10  CREATININE 0.64 0.62  CALCIUM 7.6* 7.0*   Medications: I have reviewed the patient's current medications.  Assessment/Plan: Dysphagia and odynophagia-proceed with an EGD at this time.   LOS: 1 day   Juanita Craver 11/26/2019, 7:41 AM

## 2019-11-26 NOTE — Progress Notes (Signed)
ANTICOAGULATION CONSULT NOTE - Initial Consult  Pharmacy Consult for heparin Indication: Recent DVT  Allergies  Allergen Reactions  . Aspirin Other (See Comments)    Reaction - spots  . Cephalexin Other (See Comments)    Reported by [facility] 08/19/2018  . Latex Other (See Comments)    Unknown reaction  . Penicillins     Has patient had a PCN reaction causing immediate rash, facial/tongue/throat swelling, SOB or lightheadedness with hypotension:No Has patient had a PCN reaction causing severe rash involving mucus membranes or skin necrosis: No Has patient had a PCN reaction that required hospitalization:NoHas patient had a PCN reaction occurring within the last 10 years:No If all of the above answers are "NO", then may proceed with Cephalosporin use.     Patient Measurements: Height: 5\' 7"  (170.2 cm) Weight: 149 lb 4 oz (67.7 kg) IBW/kg (Calculated) : 66.1 Heparin Dosing Weight: 67.7 kg  Vital Signs: Temp: 97.5 F (36.4 C) (01/23 0927) Temp Source: Axillary (01/23 0927) BP: 109/59 (01/23 0927) Pulse Rate: 71 (01/23 0927)  Labs: Recent Labs    11/25/19 1227 11/26/19 0237  HGB 11.7* 10.6*  HCT 37.5* 34.1*  PLT 243 203  CREATININE 0.64 0.62    Estimated Creatinine Clearance: 51.6 mL/min (by C-G formula based on SCr of 0.62 mg/dL).   Medical History: Past Medical History:  Diagnosis Date  . CAD (coronary artery disease)    moderate by catheterization,11/08...essentially unchanged by repeat study,10/11  . Carotid artery disease (HCC)     less than 50% stenosis bilaterally March 2013   . Claudication ([place])     ABIs within ABIs within normal limits, 40% left iliac disease by catheterization   . Colon polyps   . COVID-19 10/2019  . Depression   . Dizzy   . Edema extremities     secondary to severe varicose veins. Nonpitting edema and dermatitis.   . Emphysema   . Glaucoma   . Hyperlipidemia   . IBS (irritable bowel syndrome)   . Mitral  regurgitation    mild  . Mixed dyslipidemia   . Peripheral edema   . Pneumonia   . Prostate cancer [facility])    s/p radiation treatment/s/p TURP  . Shortness of breath    Emphysema  . Thyroid nodule     posterior and inferior to the right lobepossibly outside of the gland secondary to parathyroid adenoma in addition multiple bilateral thyroid nodules     Medications:  Scheduled:  . pantoprazole (PROTONIX) IV  40 mg Intravenous Q24H   Infusions:  . cefTRIAXone (ROCEPHIN)  IV Stopped (11/25/19 1727)  . dextrose 5% lactated ringers    . fluconazole (DIFLUCAN) IV Stopped (11/26/19 0039)  . potassium chloride 10 mEq (11/26/19 1043)    Assessment: 89 yoM admitted from NH on 1/22 for dysphagia and odynophagia. Pt was on Xarelto PTA for recent DVT d/t COVID (diagnosed 10/27/2019), last dose taken 1/21. Xarelto has been held since then. Pt is s/p EGD and removal of food bolus this AM. Pharmacy consulted to dose heparin for bridging while awaiting dilation procedure on Monday.  Hgb is low but stable, platelet count is wnl. Renal function is stable, SCr 0.62.   Goal of Therapy:  Heparin level 0.3-0.7 units/ml Monitor platelets by anticoagulation protocol: Yes   Plan:  Heparin bolus 3500 units (~50 units/kg) Heparin infusion at 1100 units/hr (~16 units/kg/hr) 8 hour heparin level Daily heparin level, CBC, and s/sx of bleeding F/u resuming Xarelto post-procedure  Berenice Bouton, PharmD  PGY1 Pharmacy Resident  Please check AMION for all [facility] Pharmacy phone numbers After 10:00 PM, call [place] [phone] 11/26/2019,10:52 AM

## 2019-11-26 NOTE — Anesthesia Postprocedure Evaluation (Signed)
Anesthesia Post Note  Patient: Warren [name]  Procedure(s) Performed: ESOPHAGOGASTRODUODENOSCOPY (EGD) WITH PROPOFOL (N/A )     Patient location during evaluation: PACU Anesthesia Type: General Level of consciousness: sedated Pain management: pain level controlled Vital Signs Assessment: post-procedure vital signs reviewed and stable Respiratory status: spontaneous breathing and respiratory function stable Cardiovascular status: stable Postop Assessment: no apparent nausea or vomiting Anesthetic complications: no    Last Vitals:  Vitals:   11/26/19 0851 11/26/19 0927  BP: (!) 118/57 (!) 109/59  Pulse: 72 71  Resp: 15 16  Temp:  (!) 36.4 C  SpO2: 100% 97%    Last Pain:  Vitals:   11/26/19 1022  TempSrc:   PainSc: 0-No pain                 Jaiyla Granados DANIEL

## 2019-11-26 NOTE — Op Note (Signed)
[facility] Patient Name: Warren [name] Procedure Date : 11/26/2019 MRN: [id number] Attending MD: Juanita Craver , MD Date of Birth: Jun 22, 1922 CSN: [id number] Age: 86 Admit Type: Inpatient Procedure:                EGD with removal of food bolus. Indications:              Dysphagia, Odynophagia, History of presbyesophagus. Providers:                Juanita Craver, MD, Josie Dixon, RN, Janeece Agee,                            Technician, Clearnce Sorrel, CRNA, Duane Boston, MD Referring MD:             Peri Jefferson. Daryll Drown, MD Medicines:                Monitored Anesthesia Care Complications:            No immediate complications. Estimated Blood Loss:     Estimated blood loss was minimal. Procedure:                Pre-Anesthesia Assessment: - Prior to the                            procedure, a history and physical was performed,                            and patient medications and allergies were                            reviewed. The patient's tolerance of previous                            anesthesia was also reviewed. The risks and                            benefits of the procedure and the sedation options                            and risks were discussed with the patient's son,                            Warren [name], over the phone, as the patient                            appeared somewhat confused. All questions were                            answered, and informed consent was obtained. Prior                            Anticoagulants: The patient has taken Xarelto                            (Rivaroxaban), last dose was 1 day prior to  procedure. ASA Grade Assessment: III - A patient                            with severe systemic disease. After reviewing the                            risks and benefits, the patient was deemed in                            satisfactory condition to undergo the procedure.   After obtaining informed consent, the endoscope was                            passed under direct vision. Throughout the                            procedure, the patient's blood pressure, pulse, and                            oxygen saturations were monitored continuously. The                            [id number]-H190 ([id number]) Olympus gastroscope was                            introduced through the mouth, and advanced to the                            fundus of the stomach. The EGD was extremely                            difficult due to presence of food. Completion of                            the procedure was aided by aggressive lavage. The                            patient tolerated the procedure well. Scope In: Scope Out: Findings:      The lumen of the entire esophagus was dilated, consistent with a       previous history of presbyesophagus, and there was solid food noted       along the entire length of the esophagus from the UES to the LES; the       scope was then withdrawn and the patient was intubated to protect his       airway. There was a bolus of food in the mid-esophagus that was removed       with a Talon forceps. The scope was removed and the esophagus intubated       over 20 times to remove debris from the esophagus; an orogastric tube       was passed by the CRNA but we were not able to suction much of the       debris through the OG tube. I attempted to remove as much debris as       possible  form the entire length of the esophagus; the distal esophageal       mucosa appeared macerated and bled easily. There was some hesitancy in       the passage of the scope thrpugh the GEJ but a dilation was not       attempted. The stomach was not visualized in its entirety and the scope       was not passed in the duodenum due to debris in the stomach. It was       difficult to say where there was any evidence of candidal infection in       the esophagus. Impression:                - Dilation in the entire esophagus with solid food                            debris noted along the entire length of the                            esophagus; large bolus removed with a Talon forceps                            and multiple smaller pieces of debris/food removed                            by lavage and by suctioning up the debris to the                            tip the scope. Most of the debris was cleared from                            the esophagus but the distal esophageal mucosa                            appeared macerated and the friable. There appeared                            to some stenosis at the GEJ but this was not                            dilated today.                           - Debris noted the stomach as well; the entire                            gastric mucosa was not visualized and the duodenum                            was not intubated. Moderate Sedation:      MAC used. Recommendation:           - NPO today to observe for any signs of  aspiration.I have discussed the results of the                            procedure with patient's son along with the                            limitations of what can be done under these                            circumstances.                           - We will need to keep him off the Xarelto for a                            couple of days so that a repeat EGD can be done on                            Monday 11/28/2019.                           - I feel the patient may need to be kept on a                            liquid diet in the futurea a                           - Plans discussed with patient's son Warren [name], over the phone. Procedure Code(s):        --- Professional ---                           [phone], Esophagogastroduodenoscopy, flexible,                            transoral; with removal of foreign body(s) Diagnosis Code(s):         --- Professional ---                           R13.10, Dysphagia, unspecified                           K22.8, Other specified diseases of esophagus CPT copyright 2019 [employer]. All rights reserved. The codes documented in this report are preliminary and upon coder review may  be revised to meet current compliance requirements. Juanita Craver, MD Juanita Craver, MD 11/26/2019 8:59:26 AM This report has been signed electronically. Number of Addenda: 0

## 2019-11-27 DIAGNOSIS — R1310 Dysphagia, unspecified: Secondary | ICD-10-CM

## 2019-11-27 LAB — URINE CULTURE: Culture: 60000 — AB

## 2019-11-27 LAB — MAGNESIUM: Magnesium: 1.6 mg/dL — ABNORMAL LOW (ref 1.7–2.4)

## 2019-11-27 LAB — BASIC METABOLIC PANEL
Anion gap: 9 (ref 5–15)
BUN: 5 mg/dL — ABNORMAL LOW (ref 8–23)
CO2: 27 mmol/L (ref 22–32)
Calcium: 7.2 mg/dL — ABNORMAL LOW (ref 8.9–10.3)
Chloride: 104 mmol/L (ref 98–111)
Creatinine, Ser: 0.45 mg/dL — ABNORMAL LOW (ref 0.61–1.24)
GFR calc Af Amer: >60 mL/min (ref >60)
GFR calc non Af Amer: >60 mL/min (ref >60)
Glucose, Bld: 114 mg/dL — ABNORMAL HIGH (ref 70–99)
Potassium: 2.9 mmol/L — ABNORMAL LOW (ref 3.5–5.1)
Sodium: 140 mmol/L (ref 135–145)

## 2019-11-27 LAB — CBC
HCT: 35 % — ABNORMAL LOW (ref 39.0–52.0)
Hemoglobin: 11 g/dL — ABNORMAL LOW (ref 13.0–17.0)
MCH: 29.7 pg (ref 26.0–34.0)
MCHC: 31.4 g/dL (ref 30.0–36.0)
MCV: 94.6 fL (ref 80.0–100.0)
Platelets: 188 10*3/uL (ref 150–400)
RBC: 3.7 MIL/uL — ABNORMAL LOW (ref 4.22–5.81)
RDW: 14.8 % (ref 11.5–15.5)
WBC: 8.9 10*3/uL (ref 4.0–10.5)
nRBC: 0 % (ref 0.0–0.2)

## 2019-11-27 LAB — HEPARIN LEVEL (UNFRACTIONATED)
Heparin Unfractionated: 0.58 IU/mL (ref 0.30–0.70)
Heparin Unfractionated: 0.72 IU/mL — ABNORMAL HIGH (ref 0.30–0.70)
Heparin Unfractionated: 0.45 IU/mL (ref 0.30–0.70)

## 2019-11-27 LAB — APTT
aPTT: 129 seconds — ABNORMAL HIGH (ref 24–36)
aPTT: 105 seconds — ABNORMAL HIGH (ref 24–36)

## 2019-11-27 MED ORDER — POTASSIUM CHLORIDE 10 MEQ/100ML IV SOLN
10.0000 meq | INTRAVENOUS | Status: AC
  Administered 2019-11-27 (×5): 10 meq via INTRAVENOUS
  Filled 2019-11-27 (×4): qty 100

## 2019-11-27 MED ORDER — MAGNESIUM SULFATE IN D5W 1-5 GM/100ML-% IV SOLN
1.0000 g | Freq: Once | INTRAVENOUS | Status: AC
  Administered 2019-11-27: 1 g via INTRAVENOUS
  Filled 2019-11-27 (×2): qty 100

## 2019-11-27 NOTE — Progress Notes (Signed)
ANTICOAGULATION CONSULT NOTE - Follow Up Consult  Pharmacy Consult for heparin Indication: Recent DVT  Allergies  Allergen Reactions  . Aspirin Other (See Comments)    Reaction - spots  . Cephalexin Other (See Comments)    Reported by [facility] 08/19/2018  . Latex Other (See Comments)    Unknown reaction  . Penicillins     Has patient had a PCN reaction causing immediate rash, facial/tongue/throat swelling, SOB or lightheadedness with hypotension:No Has patient had a PCN reaction causing severe rash involving mucus membranes or skin necrosis: No Has patient had a PCN reaction that required hospitalization:NoHas patient had a PCN reaction occurring within the last 10 years:No If all of the above answers are "NO", then may proceed with Cephalosporin use.     Patient Measurements: Height: 5\' 7"  (170.2 cm) Weight: 149 lb 4 oz (67.7 kg) IBW/kg (Calculated) : 66.1 Heparin Dosing Weight: 67.7 kg  Vital Signs: Temp: 98.2 F (36.8 C) (01/24 1349) Temp Source: Oral (01/24 1349) BP: 108/65 (01/24 1349) Pulse Rate: 72 (01/24 1349)  Labs: Recent Labs    11/25/19 1227 11/25/19 1227 11/26/19 0237 11/26/19 2104 11/27/19 0430 11/27/19 0716 11/27/19 1639  HGB 11.7*   < > 10.6*  --  11.0*  --   --   HCT 37.5*  --  34.1*  --  35.0*  --   --   PLT 243  --  203  --  188  --   --   APTT  --   --   --   --   --  129* 105*  HEPARINUNFRC  --   --   --    < > 0.58 0.72* 0.45  CREATININE 0.64  --  0.62  --  0.45*  --   --    < > = values in this interval not displayed.    Estimated Creatinine Clearance: 51.6 mL/min (A) (by C-G formula based on SCr of 0.45 mg/dL (L)).   Medical History: Past Medical History:  Diagnosis Date  . CAD (coronary artery disease)    moderate by catheterization,11/08...essentially unchanged by repeat study,10/11  . Carotid artery disease (HCC)     less than 50% stenosis bilaterally March 2013   . Claudication ([place])     ABIs within ABIs within  normal limits, 40% left iliac disease by catheterization   . Colon polyps   . COVID-19 10/2019  . Depression   . Dizzy   . Edema extremities     secondary to severe varicose veins. Nonpitting edema and dermatitis.   . Emphysema   . Glaucoma   . Hyperlipidemia   . IBS (irritable bowel syndrome)   . Mitral regurgitation    mild  . Mixed dyslipidemia   . Peripheral edema   . Pneumonia   . Prostate cancer [facility])    s/p radiation treatment/s/p TURP  . Shortness of breath    Emphysema  . Thyroid nodule     posterior and inferior to the right lobepossibly outside of the gland secondary to parathyroid adenoma in addition multiple bilateral thyroid nodules     Medications:  Scheduled:  . pantoprazole (PROTONIX) IV  40 mg Intravenous Q24H   Infusions:  . cefTRIAXone (ROCEPHIN)  IV Stopped (11/27/19 1643)  . dextrose 5% lactated ringers 100 mL/hr at 11/27/19 1327  . fluconazole (DIFLUCAN) IV 200 mg (11/27/19 1645)  . heparin 850 Units/hr (11/27/19 1646)    Assessment: 89 yoM admitted from NH on 1/22 for dysphagia  and odynophagia. Pt was on Xarelto PTA for recent DVT d/t COVID (diagnosed 10/27/2019), last dose taken 1/21. Xarelto has been held since then. Pt is s/p EGD and removal of food bolus this AM. Pharmacy consulted to dose heparin for bridging while awaiting dilation procedure on Monday.  Heparin level is therapeutic 0.45. aPTT is just slightly above goal at 105 (goal 66-102s). Since levels are now correlating, will only collect heparin levels while on IV heparin.   Goal of Therapy:  Heparin level 0.3-0.7 units/ml Monitor platelets by anticoagulation protocol: Yes   Plan:  Continue heparin infusion at 850 units/hr Monitor daily heparin level, CBC, and s/sx of bleeding F/u resuming Xarelto post-procedure  Vertis Kelch, PharmD, [facility] PGY2 Cardiology Pharmacy Resident Phone [phone] 11/27/2019       5:44 PM  Please check [email or web address] for unit-specific pharmacist phone  numbers

## 2019-11-27 NOTE — Progress Notes (Addendum)
ANTICOAGULATION CONSULT NOTE - Follow Up Consult  Pharmacy Consult for heparin Indication: Recent DVT  Allergies  Allergen Reactions  . Aspirin Other (See Comments)    Reaction - spots  . Cephalexin Other (See Comments)    Reported by [facility] 08/19/2018  . Latex Other (See Comments)    Unknown reaction  . Penicillins     Has patient had a PCN reaction causing immediate rash, facial/tongue/throat swelling, SOB or lightheadedness with hypotension:No Has patient had a PCN reaction causing severe rash involving mucus membranes or skin necrosis: No Has patient had a PCN reaction that required hospitalization:NoHas patient had a PCN reaction occurring within the last 10 years:No If all of the above answers are "NO", then may proceed with Cephalosporin use.     Patient Measurements: Height: 5\' 7"  (170.2 cm) Weight: 149 lb 4 oz (67.7 kg) IBW/kg (Calculated) : 66.1 Heparin Dosing Weight: 67.7 kg  Vital Signs: Temp: 97.8 F (36.6 C) (01/24 0625) Temp Source: Oral (01/24 0625) BP: 113/56 (01/24 0625) Pulse Rate: 65 (01/24 0625)  Labs: Recent Labs    11/25/19 1227 11/25/19 1227 11/26/19 0237 11/26/19 2104 11/27/19 0430 11/27/19 0716  HGB 11.7*   < > 10.6*  --  11.0*  --   HCT 37.5*  --  34.1*  --  35.0*  --   PLT 243  --  203  --  188  --   APTT  --   --   --   --   --  129*  HEPARINUNFRC  --   --   --  1.12* 0.58 0.72*  CREATININE 0.64  --  0.62  --  0.45*  --    < > = values in this interval not displayed.    Estimated Creatinine Clearance: 51.6 mL/min (A) (by C-G formula based on SCr of 0.45 mg/dL (L)).   Medical History: Past Medical History:  Diagnosis Date  . CAD (coronary artery disease)    moderate by catheterization,11/08...essentially unchanged by repeat study,10/11  . Carotid artery disease (HCC)     less than 50% stenosis bilaterally March 2013   . Claudication ([place])     ABIs within ABIs within normal limits, 40% left iliac disease by  catheterization   . Colon polyps   . COVID-19 10/2019  . Depression   . Dizzy   . Edema extremities     secondary to severe varicose veins. Nonpitting edema and dermatitis.   . Emphysema   . Glaucoma   . Hyperlipidemia   . IBS (irritable bowel syndrome)   . Mitral regurgitation    mild  . Mixed dyslipidemia   . Peripheral edema   . Pneumonia   . Prostate cancer [facility])    s/p radiation treatment/s/p TURP  . Shortness of breath    Emphysema  . Thyroid nodule     posterior and inferior to the right lobepossibly outside of the gland secondary to parathyroid adenoma in addition multiple bilateral thyroid nodules     Medications:  Scheduled:  . pantoprazole (PROTONIX) IV  40 mg Intravenous Q24H   Infusions:  . cefTRIAXone (ROCEPHIN)  IV 1 g (11/26/19 1724)  . dextrose 5% lactated ringers 100 mL/hr at 11/27/19 0519  . fluconazole (DIFLUCAN) IV Stopped (11/26/19 1717)  . heparin 950 Units/hr (11/27/19 0732)  . magnesium sulfate bolus IVPB    . potassium chloride 10 mEq (11/27/19 0743)    Assessment: 89 yoM admitted from NH on 1/22 for dysphagia and odynophagia. Pt  was on Xarelto PTA for recent DVT d/t COVID (diagnosed 10/27/2019), last dose taken 1/21. Xarelto has been held since then. Pt is s/p EGD and removal of food bolus this AM. Pharmacy consulted to dose heparin for bridging while awaiting dilation procedure on Monday.  Heparin level after rate reduction is slightly supratherapeutic at 0.72. aPTT also supratherapeutic at 129. Hgb is stable at 11, Plt wnl. No bleeding noted per RN.  Goal of Therapy:  Heparin level 0.3-0.7 units/ml Monitor platelets by anticoagulation protocol: Yes   Plan:  Reduce heparin infusion to 850 units/hr 8 hour heparin level/aPTT Monitor daily heparin level, CBC, and s/sx of bleeding F/u resuming Xarelto post-procedure  Berenice Bouton, PharmD PGY1 Pharmacy Resident  Please check AMION for all [place] phone numbers After 10:00 PM, call  [place] [phone]  11/27/2019       8:25 AM

## 2019-11-27 NOTE — Progress Notes (Signed)
   Subjective: HD#2   Overnight: EGD performed. No acute events   Today, Warren [name] had just awoken from sleep. He reports of bilateral rib pain as well as suprapubic pain. He is unable to clarify further the duration of this pain. He denies nausea or vomiting  Objective:  Vital signs in last 24 hours: Vitals:   11/26/19 1317 11/26/19 1700 11/26/19 2143 11/27/19 0625  BP: 93/74 96/72 108/67 (!) 113/56  Pulse: 73 71 67 65  Resp: 17 18 19 17   Temp: 97.7 F (36.5 C) 97.6 F (36.4 C) 98.2 F (36.8 C) 97.8 F (36.6 C)  TempSrc: Oral Oral Oral Oral  SpO2: 99% 100% 100% 100%  Weight:      Height:       Const: In no apparent distress CV: RRR, no murmurs, gallop, rub Abd: Bowel sounds present, nondistended, nontender to palpation GU: Mild suprapubic tenderness   Assessment/Plan:  Principal Problem:   Dysphagia Active Problems:   Acute cystitis without hematuria   Odynophagia  [name] is a 86 year old gentleman with a past medical history significant fordementia,CAD s/p cardiac cath in 2008, carotid stenosis, PVD, COPD,Covid pneumonia in late December,glaucoma, HLD, prostate cancer status post radiation and TURP procedure, Barrett's esophagus, and chronic intermittent dysphagia secondary to esophageal stenosis (dilated in May 2019)who presents today with a 4 to 5-day history of decreased p.o. intake in addition to recurrent UTI.  #HxEsophagealStrictures #Hx Barrett's Esophagus #Hx Presbyesophagus #Dysphagia #Odynophagia: EGD was performed on 1/23 with GI and with demonstrated dilation in the entire esophagus with solid food debris noted along the entire length. There was stenosis at the GEJ but this was not dilated. Son has been updated by GI. -Appreciate GI recommendations -Repeat EGD on Monday, 1/25             -Continue holding home Xarelto, on heparin gtt -IV fluconazole 200 mg daily -IV Protonix 40 mg daily            #RecurentUTI:Patient's had recurrent UTIs over the past month. Last urine culture on12/31 and1/7 grew Proteus mirabilis.Patient currently has urinalysis consistent with UTI complains of suprapubic tenderness.Patient has had a indwelling Foley for the past 2 weeks due to urinary incontinence. It is unclear if the patient completed treatment for his UTI. After his ED visit on 12/31, there are mixed notes stating that he took Bactrim and ciprofloxacin. After his visit on 1/7, the notes state that he took meropenem for 7 days. Ucx this admission now growing 60K colonies of GNR -Replaced indwelling Foley with condom catheter on HOD1 -Continue ceftriaxone 1gq24hrs -Daily CBC -F/u repeaturine cultures/sensitivity, currently pending  #History of dementia:Mental status is currently at baseline. Son states that he tends to get more confused later in the day,but generally mostly oriented. -Delirium precautions -Redirect as needed -Ativan 0.25 mgPRN  #FEN/GI #Hypokalemia: IV repletion #Hypomagnesemia: IV repletion -Diet:NPO -Fluids:D5 LR 100 cc/hr  #Hx DVT #DVT prophylaxis:Patient has been taking Xarelto for DVTdeveloped after being hospitalized for COVID-19 infection at the end of December. -Continue heparin gtt  #CODE STATUS: DNR  #Dispo:  Prior to Admission Living Arrangement: [place] home Anticipated Discharge Location: [facility] nursing home Barriers to Discharge: Pending medical work-up, EGD scheduled for Monday     Jean Rosenthal, MD 11/27/2019, 6:59 AM Pager: [phone] Internal Medicine Teaching Service

## 2019-11-27 NOTE — Progress Notes (Signed)
  Date: 11/27/2019  Patient name: Warren [name]  Medical record number: [id number]  Date of birth: 07/15/1922   This patient's plan of care was discussed with the house staff. Please see their note for complete details. I concur with their findings.   Sid Falcon, MD 11/27/2019, 12:38 PM

## 2019-11-27 NOTE — Progress Notes (Signed)
CROSS COVER [facility] Subjective: [name] is a 86 year old white male with multiple medical problems including, hyperlipidemia, emphysema, glaucoma, CAD, DVT and history of prostate cancer who has been admitted to the hospital for intermittent dysphagia with a known history of presbyesophagus who has not been able to eat or drink for the last couple of days prior to admission.  An EGD done yesterday revealed revealed large amount of debris in the esophagus from the UES down to the LES. Aggressive lavage was done and a large piece of debris was removed with a tail on forceps. The distal esophageal mucosa seem macerated in the GE junction seems somewhat stenosed. The exam was not adequate as the patient had to be intubated and orogastric lavage had to be done. As per my conversation with the patient's nurse he has not been given any liquids to drink today.    Objective: Vital signs in last 24 hours: Temp:  [97.2 F (36.2 C)-98.2 F (36.8 C)] 97.8 F (36.6 C) (01/24 0625) Pulse Rate:  [65-73] 65 (01/24 0625) Resp:  [15-19] 17 (01/24 0625) BP: (93-119)/(56-74) 113/56 (01/24 0625) SpO2:  [97 %-100 %] 100 % (01/24 0625) Last BM Date: 11/26/19  Intake/Output from previous day: 01/23 0701 - 01/24 0700 In: 801.7 [I.V.:801.7] Out: 600 [Urine:600] Intake/Output this shift: No intake/output data recorded.  General appearance: pleasantly demented, cooperative, appears stated age and no distress Resp: clear to auscultation bilaterally Cardio: regular rate and rhythm, S1, S2 normal, no murmur, click, rub or gallop GI: soft, non-tender; bowel sounds normal; no masses,  no organomegaly Extremities: extremities normal, atraumatic, no cyanosis or edema  Lab Results: Recent Labs    11/25/19 1227 11/26/19 0237 11/27/19 0430  WBC 13.0* 9.9 8.9  HGB 11.7* 10.6* 11.0*  HCT 37.5* 34.1* 35.0*  PLT 243 203 188   BMET Recent Labs    11/25/19 1227 11/26/19 0237 11/27/19 0430  NA 141 141  140  K 3.0* 3.1* 2.9*  CL 98 104 104  CO2 34* 29 27  GLUCOSE 119* 82 114*  BUN 13 10 5*  CREATININE 0.64 0.62 0.45*  CALCIUM 7.6* 7.0* 7.2*   Medications: I have reviewed the patient's current medications.  Assessment/Plan: 1) Severe dysphagia with odynophagia-is a 86 year old male with presbyesophagus; severe food impaction noted on endoscopy yesterday;  aggressive lavage was done. Plans are to repeat an endoscopy and maybe dilate the GE junction to help with the patient's symptoms.  As per my discussion with the patient's son I personally feel the patient be maintained on liquid or soft diet due to his underlying problem with presbyesophagus.  2) Hypokalemia-will need supplementation. 3) History of DVT on Xarelto which needs to be on hold for the procedure tomorrow; he is presently on heparin which will will need to be held 4 to 6 hours prior to the endoscopy.    LOS: 2 days   Juanita Craver 11/27/2019, 8:17 AM

## 2019-11-27 NOTE — H&P (View-Only) (Signed)
CROSS COVER [facility] Subjective: [name] is a 86 year old white male with multiple medical problems including, hyperlipidemia, emphysema, glaucoma, CAD, DVT and history of prostate cancer who has been admitted to the hospital for intermittent dysphagia with a known history of presbyesophagus who has not been able to eat or drink for the last couple of days prior to admission.  An EGD done yesterday revealed revealed large amount of debris in the esophagus from the UES down to the LES. Aggressive lavage was done and a large piece of debris was removed with a tail on forceps. The distal esophageal mucosa seem macerated in the GE junction seems somewhat stenosed. The exam was not adequate as the patient had to be intubated and orogastric lavage had to be done. As per my conversation with the patient's nurse he has not been given any liquids to drink today.    Objective: Vital signs in last 24 hours: Temp:  [97.2 F (36.2 C)-98.2 F (36.8 C)] 97.8 F (36.6 C) (01/24 0625) Pulse Rate:  [65-73] 65 (01/24 0625) Resp:  [15-19] 17 (01/24 0625) BP: (93-119)/(56-74) 113/56 (01/24 0625) SpO2:  [97 %-100 %] 100 % (01/24 0625) Last BM Date: 11/26/19  Intake/Output from previous day: 01/23 0701 - 01/24 0700 In: 801.7 [I.V.:801.7] Out: 600 [Urine:600] Intake/Output this shift: No intake/output data recorded.  General appearance: pleasantly demented, cooperative, appears stated age and no distress Resp: clear to auscultation bilaterally Cardio: regular rate and rhythm, S1, S2 normal, no murmur, click, rub or gallop GI: soft, non-tender; bowel sounds normal; no masses,  no organomegaly Extremities: extremities normal, atraumatic, no cyanosis or edema  Lab Results: Recent Labs    11/25/19 1227 11/26/19 0237 11/27/19 0430  WBC 13.0* 9.9 8.9  HGB 11.7* 10.6* 11.0*  HCT 37.5* 34.1* 35.0*  PLT 243 203 188   BMET Recent Labs    11/25/19 1227 11/26/19 0237 11/27/19 0430  NA 141 141  140  K 3.0* 3.1* 2.9*  CL 98 104 104  CO2 34* 29 27  GLUCOSE 119* 82 114*  BUN 13 10 5*  CREATININE 0.64 0.62 0.45*  CALCIUM 7.6* 7.0* 7.2*   Medications: I have reviewed the patient's current medications.  Assessment/Plan: 1) Severe dysphagia with odynophagia-is a 86 year old male with presbyesophagus; severe food impaction noted on endoscopy yesterday;  aggressive lavage was done. Plans are to repeat an endoscopy and maybe dilate the GE junction to help with the patient's symptoms.  As per my discussion with the patient's son I personally feel the patient be maintained on liquid or soft diet due to his underlying problem with presbyesophagus.  2) Hypokalemia-will need supplementation. 3) History of DVT on Xarelto which needs to be on hold for the procedure tomorrow; he is presently on heparin which will will need to be held 4 to 6 hours prior to the endoscopy.    LOS: 2 days   Warren [name] 11/27/2019, 8:17 AM

## 2019-11-28 ENCOUNTER — Encounter (HOSPITAL_COMMUNITY): Payer: Self-pay | Admitting: Internal Medicine

## 2019-11-28 ENCOUNTER — Inpatient Hospital Stay (HOSPITAL_COMMUNITY): Payer: Medicare Other | Admitting: Certified Registered Nurse Anesthetist

## 2019-11-28 ENCOUNTER — Encounter (HOSPITAL_COMMUNITY)
Admission: EM | Disposition: A | Discharge status: Skilled Nursing Facility | Payer: Self-pay | Source: Skilled Nursing Facility | Attending: Internal Medicine

## 2019-11-28 DIAGNOSIS — K2100 Gastro-esophageal reflux disease with esophagitis, without bleeding: Secondary | ICD-10-CM

## 2019-11-28 DIAGNOSIS — K209 Esophagitis, unspecified without bleeding: Secondary | ICD-10-CM

## 2019-11-28 HISTORY — PX: ESOPHAGOGASTRODUODENOSCOPY: SHX5428

## 2019-11-28 LAB — BASIC METABOLIC PANEL
Anion gap: 9 (ref 5–15)
Anion gap: 6 (ref 5–15)
BUN: <5 mg/dL — ABNORMAL LOW (ref 8–23)
BUN: <5 mg/dL — ABNORMAL LOW (ref 8–23)
CO2: 30 mmol/L (ref 22–32)
CO2: 32 mmol/L (ref 22–32)
Calcium: 7.4 mg/dL — ABNORMAL LOW (ref 8.9–10.3)
Calcium: 7.5 mg/dL — ABNORMAL LOW (ref 8.9–10.3)
Chloride: 101 mmol/L (ref 98–111)
Chloride: 101 mmol/L (ref 98–111)
Creatinine, Ser: 0.49 mg/dL — ABNORMAL LOW (ref 0.61–1.24)
Creatinine, Ser: 0.42 mg/dL — ABNORMAL LOW (ref 0.61–1.24)
GFR calc Af Amer: >60 mL/min (ref >60)
GFR calc Af Amer: >60 mL/min (ref >60)
GFR calc non Af Amer: >60 mL/min (ref >60)
GFR calc non Af Amer: >60 mL/min (ref >60)
Glucose, Bld: 195 mg/dL — ABNORMAL HIGH (ref 70–99)
Glucose, Bld: 125 mg/dL — ABNORMAL HIGH (ref 70–99)
Potassium: 2.9 mmol/L — ABNORMAL LOW (ref 3.5–5.1)
Potassium: 3.8 mmol/L (ref 3.5–5.1)
Sodium: 140 mmol/L (ref 135–145)
Sodium: 139 mmol/L (ref 135–145)

## 2019-11-28 LAB — CBC
HCT: 35.2 % — ABNORMAL LOW (ref 39.0–52.0)
Hemoglobin: 11.4 g/dL — ABNORMAL LOW (ref 13.0–17.0)
MCH: 29.9 pg (ref 26.0–34.0)
MCHC: 32.4 g/dL (ref 30.0–36.0)
MCV: 92.4 fL (ref 80.0–100.0)
Platelets: 186 10*3/uL (ref 150–400)
RBC: 3.81 MIL/uL — ABNORMAL LOW (ref 4.22–5.81)
RDW: 14.9 % (ref 11.5–15.5)
WBC: 7.6 10*3/uL (ref 4.0–10.5)
nRBC: 0 % (ref 0.0–0.2)

## 2019-11-28 LAB — PHOSPHORUS: Phosphorus: 1.6 mg/dL — ABNORMAL LOW (ref 2.5–4.6)

## 2019-11-28 LAB — MAGNESIUM: Magnesium: 1.6 mg/dL — ABNORMAL LOW (ref 1.7–2.4)

## 2019-11-28 LAB — HEPARIN LEVEL (UNFRACTIONATED): Heparin Unfractionated: 0.33 IU/mL (ref 0.30–0.70)

## 2019-11-28 SURGERY — ESOPHAGOGASTRODUODENOSCOPY (EGD) WITH PROPOFOL
Anesthesia: Monitor Anesthesia Care

## 2019-11-28 SURGERY — EGD (ESOPHAGOGASTRODUODENOSCOPY)
Anesthesia: Monitor Anesthesia Care | Laterality: Left

## 2019-11-28 MED ORDER — DEXMEDETOMIDINE HCL 200 MCG/2ML IV SOLN
INTRAVENOUS | Status: DC | PRN
  Administered 2019-11-28 (×2): 4 ug via INTRAVENOUS

## 2019-11-28 MED ORDER — SODIUM CHLORIDE 0.9 % IV SOLN: INTRAVENOUS | Status: DC

## 2019-11-28 MED ORDER — SUCRALFATE 1 GM/10ML PO SUSP
1.0000 g | Freq: Three times a day (TID) | ORAL | Status: DC
  Administered 2019-11-28 – 2019-11-29 (×5): 1 g via ORAL
  Filled 2019-11-28 (×9): qty 10

## 2019-11-28 MED ORDER — SODIUM PHOSPHATES 45 MMOLE/15ML IV SOLN
30.0000 mmol | Freq: Once | INTRAVENOUS | Status: AC
  Administered 2019-11-28: 30 mmol via INTRAVENOUS
  Filled 2019-11-28: qty 10

## 2019-11-28 MED ORDER — PANTOPRAZOLE SODIUM 40 MG IV SOLR
40.0000 mg | Freq: Two times a day (BID) | INTRAVENOUS | Status: DC
  Administered 2019-11-28 – 2019-11-29 (×2): 40 mg via INTRAVENOUS
  Filled 2019-11-28 (×3): qty 40

## 2019-11-28 MED ORDER — MAGNESIUM SULFATE 2 GM/50ML IV SOLN
2.0000 g | Freq: Once | INTRAVENOUS | Status: DC
  Filled 2019-11-28: qty 50

## 2019-11-28 MED ORDER — POTASSIUM CHLORIDE 10 MEQ/100ML IV SOLN
10.0000 meq | INTRAVENOUS | Status: AC
  Administered 2019-11-28 (×5): 10 meq via INTRAVENOUS
  Filled 2019-11-28 (×3): qty 100

## 2019-11-28 MED ORDER — RIVAROXABAN 20 MG PO TABS
20.0000 mg | ORAL_TABLET | Freq: Every day | ORAL | Status: DC
  Administered 2019-11-29: 20 mg via ORAL
  Filled 2019-11-28: qty 1

## 2019-11-28 MED ORDER — MAGNESIUM SULFATE 2 GM/50ML IV SOLN
2.0000 g | Freq: Once | INTRAVENOUS | Status: AC
  Administered 2019-11-28: 2 g via INTRAVENOUS
  Filled 2019-11-28: qty 50

## 2019-11-28 MED ORDER — LACTATED RINGERS IV SOLN: INTRAVENOUS | Status: DC | PRN

## 2019-11-28 MED ORDER — PROPOFOL 500 MG/50ML IV EMUL
INTRAVENOUS | Status: DC | PRN
  Administered 2019-11-28: 10 mg via INTRAVENOUS
  Administered 2019-11-28: 75 ug/kg/min via INTRAVENOUS
  Administered 2019-11-28: 10 mg via INTRAVENOUS

## 2019-11-28 NOTE — Progress Notes (Signed)
Heparin drip was stopped at 0450 in preparation for today's EGD at 1030.

## 2019-11-28 NOTE — Progress Notes (Signed)
   Subjective: HD#3   Pt seen at the bedside this AM. Pt will get EGD at 1030 this AM with GI.  Patient has some pain at the suprapubic site, but feels okay overall.  No other complaints at this time.  Objective:  Vital signs in last 24 hours: Vitals:   11/27/19 0625 11/27/19 1349 11/27/19 2228 11/28/19 0633  BP: (!) 113/56 108/65 130/72 118/68  Pulse: 65 72 72 77  Resp: 17   18  Temp: 97.8 F (36.6 C) 98.2 F (36.8 C) 98.4 F (36.9 C) 98 F (36.7 C)  TempSrc: Oral Oral Oral Oral  SpO2: 100% 100% 98% 99%  Weight:      Height:       Physical Exam Const: Resting in bed comfortably, NAD CV: RRR, normal S1-S2 no murmurs rubs or gallops appreciated PULM: Clear to auscultation bilaterally, no crackles or wheezes appreciated ABD: Bowel sounds present, nondistended, nontender to palpation GU: Suprapubic tenderness to palpation  Assessment/Plan:  Principal Problem:   Dysphagia Active Problems:   Acute cystitis without hematuria   Odynophagia  Mr. Clucas is a 86 year old gentleman with a past medical history significant fordementia,CAD s/p cardiac cath in 2008, carotid stenosis, PVD, COPD,Covid pneumonia in late December,glaucoma, HLD, prostate cancer status post radiation and TURP procedure, Barrett's esophagus, and chronic intermittent dysphagia secondary to esophageal stenosis (dilated in May 2019)who presents today with a 4 to 5-day history of decreased p.o. intake in addition to recurrent UTI.  #HxEsophagealStrictures #Hx Barrett's Esophagus #Hx Presbyesophagus #Dysphagia #Odynophagia: EGD was performed on 1/23 with GI and with demonstrated dilation in the entire esophagus with solid food debris noted along the entire length. There was stenosis at the GEJ but this was not dilated. Son has been updated by GI. -Appreciate GI recommendations -Repeat EGD today at 10:30 AM             -Heparin held for procedure, will likely start Xarelto afterwards  pending results of EGD -IV fluconazole 200 mg daily -IV Protonix 40 mg daily              #RecurentUTI:Patient's had recurrent UTIs over the past month. Urine cultures on12/31 and1/7 grew Proteus mirabilis. Patient had an indwelling foley for the past 2 weeks due to urinary incontinence prior to hospitalization. After his ED visit on 12/31, there are mixed notes stating that he took Bactrim and ciprofloxacin. After his visit on 1/7, the notes state that he took meropenem for 7 days. UCx this admission confirmed suspected ongoing Proteus mirabilis infection. Resistant to many abx, but sensitive to ceftriaxone. -Replaced indwelling Foley with condom catheter on HOD1 -Continue ceftriaxone 1gq24hrs (day 3/14) -Daily CBC   #History of dementia:Mental status is currently at baseline. Son states that he tends to get more confused later in the day,but generally mostly oriented. -Delirium precautions -Redirect as needed -Ativan 0.25 mgPRN  #FEN/GI #Hypokalemia #Hypomagnesemia -Diet:NPO, resume liquid diet after procedure -Fluids:D5 LR 100 cc/hr -K+ 2.9 and Mg 1.6 today, Replete both  -Recheck phosphorus level, low on admission  #Hx DVT #DVT prophylaxis:Patient has been taking Xarelto for DVTdeveloped after being hospitalized for COVID-19 infection at the end of December. -Holding heparin for procedure, will start home Xarelto afterwards.  #CODE STATUS: DNR  #Dispo Prior to Admission Living Arrangement: [place] home Anticipated Discharge Location: [facility] nursing home Barriers to Discharge: Pending medical work-up, EGD scheduled for Monday   Earlene Plater, MD Internal Medicine, PGY1 Pager: [phone]  11/28/2019,9:55 AM

## 2019-11-28 NOTE — NC FL2 (Signed)
[place] LEVEL OF CARE SCREENING TOOL     IDENTIFICATION  Patient Name: Warren [name] Birthdate: 04-24-1922 Sex: male Admission Date (Current Location): 11/25/2019  [facility] and [place] Number:  Herbalist and Address:  [facility], [place] [address], [place], [place] [address]      Provider Number: [id number]  Attending Physician Name and Address:  Lucious Groves, DO  Relative Name and Phone Number:       Current Level of Care: Hospital Recommended Level of Care: [place] Prior Approval Number:    Date Approved/Denied:   PASRR Number: [id number]  Discharge Plan: SNF    Current Diagnoses: Patient Active Problem List   Diagnosis Date Noted  . Acute esophagitis   . Odynophagia 11/27/2019  . Acute cystitis without hematuria   . Dysphagia 11/25/2019  . DNR (do not resuscitate) 10/25/2019  . Acute respiratory failure with hypoxia ([place]) 10/24/2019  . Pneumonia due to COVID-19 virus 10/24/2019  . Barrett's esophagus 04/03/2018  . Candida esophagitis ([place]) 04/03/2018  . Anxiety 04/03/2018  . Esophageal dysfunction 03/23/2018  . Urinary tract infection associated with catheterization of urinary tract, initial encounter ([place]) 03/23/2018  . Leukocytosis 03/23/2018  . Dementia ([place]) 03/23/2018  . Dehydration   . Acute metabolic encephalopathy   . Nausea and vomiting 03/22/2018  . Pseudomonas urinary tract infection 03/06/2018  . Depression 03/06/2018  . Anxiety and depression 03/06/2018  . Osteoporosis 03/06/2018  . Acute lower UTI 03/01/2018  . Internal device, implant, or graft infection or inflammation 03/01/2018  . Emphysema   . Thyroid nodule   . Carotid artery disease ([place])   . CAD (coronary artery disease)   . Edema extremities   . Claudication ([place]) 07/30/2011  . Dyslipidemia 07/03/2010  . HYPERTENSION, BENIGN 07/03/2010  . PERIPHERAL EDEMA 07/03/2010  . SHORTNESS OF BREATH 07/03/2010   . PRECORDIAL PAIN 07/03/2010    Orientation RESPIRATION BLADDER Height & Weight     Self, Place  Normal Incontinent, External catheter Weight: 149 lb 4 oz (67.7 kg) Height:  5\' 7"  (170.2 cm)  BEHAVIORAL SYMPTOMS/MOOD NEUROLOGICAL BOWEL NUTRITION STATUS      Continent Diet(see discharge summary)  AMBULATORY STATUS COMMUNICATION OF NEEDS Skin   Extensive Assist Verbally                         Personal Care Assistance Level of Assistance  Bathing, Dressing, Feeding Bathing Assistance: Maximum assistance Feeding assistance: Independent Dressing Assistance: Maximum assistance     Functional Limitations Info  Sight, Hearing, Speech Sight Info: Adequate Hearing Info: Adequate Speech Info: Adequate    SPECIAL CARE FACTORS FREQUENCY                       Contractures Contractures Info: Not present    Additional Factors Info  Code Status, Allergies Code Status Info: DNR Allergies Info: Aspirin, Cephalexin, Latex, Penicillins           Current Medications (11/28/2019):  This is the current hospital active medication list Current Facility-Administered Medications  Medication Dose Route Frequency Provider Last Rate Last Admin  . cefTRIAXone (ROCEPHIN) 1 g in sodium chloride 0.9 % 100 mL IVPB  1 g Intravenous Q24H Jean Rosenthal, MD   Stopped at 11/27/19 1643  . dextrose 5 % in lactated ringers infusion   Intravenous Continuous Earlene Plater, MD 100 mL/hr at 11/28/19 0342 New Bag at 11/28/19 0342  .  fluconazole (DIFLUCAN) IVPB 200 mg  200 mg Intravenous Q24H Agyei, Obed K, MD 100 mL/hr at 11/27/19 1645 200 mg at 11/27/19 1645  . heparin ADULT infusion 100 units/mL (25000 units/211mL sodium chloride 0.45%)  850 Units/hr Intravenous Continuous Mirian Capuchin, RPH   Stopped at 11/28/19 0451  . LORazepam (ATIVAN) injection 0.25 mg  0.25 mg Intravenous QHS PRN Agyei, Obed K, MD      . magnesium sulfate IVPB 2 g 50 mL  2 g Intravenous Once Earlene Plater, MD       . pantoprazole (PROTONIX) injection 40 mg  40 mg Intravenous Q12H Thornton Park, MD      . potassium chloride 10 mEq in 100 mL IVPB  10 mEq Intravenous Q1 Hr x 6 Agyei, Obed K, MD 100 mL/hr at 11/28/19 1201 10 mEq at 11/28/19 1201  . sucralfate (CARAFATE) 1 GM/10ML suspension 1 g  1 g Oral TID WC & HS Thornton Park, MD         Discharge Medications: Please see discharge summary for a list of discharge medications.  Relevant Imaging Results:  Relevant Lab Results:   Additional Information ss# [id number]  Alexander Mt, [place]

## 2019-11-28 NOTE — Anesthesia Procedure Notes (Signed)
Procedure Name: MAC Performed by: Valda Favia, CRNA Pre-anesthesia Checklist: Patient identified, Emergency Drugs available, Suction available, Patient being monitored and Timeout performed Patient Re-evaluated:Patient Re-evaluated prior to induction Oxygen Delivery Method: Nasal cannula Induction Type: IV induction Airway Equipment and Method: Bite block Placement Confirmation: positive ETCO2 Dental Injury: Teeth and Oropharynx as per pre-operative assessment

## 2019-11-28 NOTE — Op Note (Signed)
[facility] Patient Name: Warren [name] Procedure Date : 11/28/2019 MRN: [id number] Attending MD: Thornton Park MD, MD Date of Birth: October 27, 1922 CSN: [id number] Age: 86 Admit Type: Inpatient Procedure:                Upper GI endoscopy Indications:              Dysphagia                           Esophagram 03/28/18: presbyesophagus Providers:                Thornton Park MD, MD, Jeanella Cara, RN,                            Cherylynn Ridges, Technician, Theodora Blow, Technician Referring MD:              Medicines:                Monitored Anesthesia Care Complications:            No immediate complications. Estimated Blood Loss:     Estimated blood loss: none. Procedure:                Pre-Anesthesia Assessment:                           - Prior to the procedure, a History and Physical                            was performed, and patient medications and                            allergies were reviewed. The patient's tolerance of                            previous anesthesia was also reviewed. The risks                            and benefits of the procedure and the sedation                            options and risks were discussed with the patient.                            All questions were answered, and informed consent                            was obtained. Prior Anticoagulants: The patient has                            taken Xarelto (rivaroxaban), last dose was 4 days                            prior to procedure. ASA Grade Assessment: III - A  patient with severe systemic disease. After                            reviewing the risks and benefits, the patient was                            deemed in satisfactory condition to undergo the                            procedure.                           After obtaining informed consent, the endoscope was                            passed under direct vision.  Throughout the                            procedure, the patient's blood pressure, pulse, and                            oxygen saturations were monitored continuously. The                            [id number]-[id number] Olympus gastroscope was                            introduced through the mouth, and advanced to the                            third part of duodenum. The upper GI endoscopy was                            accomplished without difficulty. The patient                            tolerated the procedure well. Scope In: Scope Out: Findings:      LA Grade D (one or more mucosal breaks involving at least 75% of       esophageal circumference) esophagitis with no bleeding was found 35 cm       from the incisors to the GE junction at 40 cm. It was not actively       bleeding but there was evidence for recent blood loss. There may be       underlying Barrett's Esophagus but it is hard to tell due to the       extensive esophagitis. There was some resistance with passage of the       gastroscope through the most distal esophagus suggesting esophageal       stricture.      The stomach was normal. No mucosal abnormality. No blood or heme seen.      The examined duodenum was normal. No mucosal abnormality. No blood or       heme seen.      The cardia and gastric fundus were normal on retroflexion.      The exam was otherwise without  abnormality. Impression:               - LA Grade D reflux esophagitis with no bleeding                            with some likely GERD-related dysmotility.                           - Otherwise normal examination.                           - No specimens collected.                           - Underlying presbyesophagus noted on prior imaging. Recommendation:           - Clear liquids today. Advance as tolerated.                           - Stay upright with eating.                           - Take small bites, chew well, and eat slowly.                             Follow bites with sips of liquid.                           - Continue present medications. Increase Protonix                            to 40 mg BID x 12 weeks. Add Carafate slurry QID x                            2 weeks.                           - Consider repeat upper endoscopy in 3 months to                            check healing.                           - Resume Xarelto tomorrow.                           - The inpatient GI team will move to standby.                            Please call the on-call gastroenterologist with any                            additional questions or concerns during this                            hospitalization.  My findings and recommendations were discussed with                            the patient's son, Warren [name], by telephone.                            All questions were answered to his satisfaction. Procedure Code(s):        --- Professional ---                           [phone], Esophagogastroduodenoscopy, flexible,                            transoral; diagnostic, including collection of                            specimen(s) by brushing or washing, when performed                            (separate procedure) Diagnosis Code(s):        --- Professional ---                           K21.00, Gastro-esophageal reflux disease with                            esophagitis, without bleeding                           R13.10, Dysphagia, unspecified CPT copyright 2019 American Medical Association. All rights reserved. The codes documented in this report are preliminary and upon coder review may  be revised to meet current compliance requirements. Thornton Park MD, MD 11/28/2019 11:24:05 AM This report has been signed electronically. Number of Addenda: 0

## 2019-11-28 NOTE — Transfer of Care (Signed)
Immediate Anesthesia Transfer of Care Note  Patient: Warren [name]  Procedure(s) Performed: ESOPHAGOGASTRODUODENOSCOPY (EGD) (Left )  Patient Location: Endoscopy Unit  Anesthesia Type:MAC  Level of Consciousness: awake and alert   Airway & Oxygen Therapy: Patient Spontanous Breathing and Patient connected to nasal cannula oxygen  Post-op Assessment: Report given to RN and Post -op Vital signs reviewed and stable  Post vital signs: Reviewed and stable  Last Vitals:  Vitals Value Taken Time  BP 135/69 11/28/19 1116  Temp 36.4 C 11/28/19 1106  Pulse 72 11/28/19 1119  Resp 20 11/28/19 1119  SpO2 100 % 11/28/19 1119  Vitals shown include unvalidated device data.  Last Pain:  Vitals:   11/28/19 1116  TempSrc:   PainSc: 0-No pain         Complications: No apparent anesthesia complications

## 2019-11-28 NOTE — Anesthesia Preprocedure Evaluation (Addendum)
Anesthesia Evaluation  Patient identified by MRN, date of birth, ID band  Reviewed: Allergy & Precautions, NPO status , Patient's Chart, lab work & pertinent test results  Airway Mallampati: III  TM Distance: >3 FB Neck ROM: Full    Dental  (+) Edentulous Upper, Edentulous Lower   Pulmonary COPD, former smoker,    Pulmonary exam normal breath sounds clear to auscultation       Cardiovascular hypertension, + CAD  Normal cardiovascular exam+ Valvular Problems/Murmurs MR  Rhythm:Regular Rate:Normal  ECG: Rate 77. Sinus rhythm Incomplete RBBB and LAFB   Neuro/Psych PSYCHIATRIC DISORDERS Anxiety Depression Dementia negative neurological ROS     GI/Hepatic negative GI ROS, Neg liver ROS, IBS (irritable bowel syndrome)   Endo/Other  negative endocrine ROS  Renal/GU negative Renal ROS     Musculoskeletal negative musculoskeletal ROS (+)   Abdominal   Peds  Hematology  (+) anemia , HLD   Anesthesia Other Findings Dysphagia/odynophagia  Reproductive/Obstetrics                            Anesthesia Physical Anesthesia Plan  ASA: III  Anesthesia Plan: MAC   Post-op Pain Management:    Induction: Intravenous  PONV Risk Score and Plan:   Airway Management Planned: Nasal Cannula  Additional Equipment:   Intra-op Plan:   Post-operative Plan:   Informed Consent: I have reviewed the patients History and Physical, chart, labs and discussed the procedure including the risks, benefits and alternatives for the proposed anesthesia with the patient or authorized representative who has indicated his/her understanding and acceptance.     Consent reviewed with POA  Plan Discussed with: CRNA  Anesthesia Plan Comments:        Anesthesia Quick Evaluation

## 2019-11-28 NOTE — TOC Initial Note (Signed)
Transition of Care [facility]) - Initial/Assessment Note    Patient Details  Name: Warren [name] MRN: [id number] Date of Birth: November 20, 1921  [facility] CM/SW Contact:    Alexander Mt, [place] Phone Number: 11/28/2019, 1:59 PM  Clinical Narrative:                 CSW spoke with pt son via telephone Octavia [phone]). Pt from [facility] where he has lived for the past 2.5 or so years. Pt usually utilizes a wheelchair and has lately needed more assistance with ADL/IADLs. Pt son verbalizes awareness of pt procedure today, hopes he gets to stay in the hospital tonight. CSW discussed that pt would likely stay tonight- unsure about further discharge timing. Explained we would get a new COVID swab and follow pt for discharge back to SNF when medically ready.  TOC team has reached out to attending MD regarding new COVID swab.   Expected Discharge Plan: Skilled Nursing Facility Barriers to Discharge: Continued Medical Work up   Patient Goals and CMS Choice Patient states their goals for this hospitalization and ongoing recovery are:: return to SNF when ready CMS Medicare.gov Compare Post Acute Care list provided to:: Patient Represenative (must comment)(pt [name]) Choice offered to / list presented to : Adult Children  Expected Discharge Plan and Services Expected Discharge Plan: Skilled Nursing Facility In-house Referral: Clinical Social Work Discharge Planning Services: CM Consult Post Acute Care Choice: Resumption of Svcs/PTA Provider, [place] Living arrangements for the past 2 months: [place]  Prior Living Arrangements/Services Living arrangements for the past 2 months: [place] Lives with:: Facility Resident Patient language and need for interpreter reviewed:: Yes(no needs) Do you feel safe going back to the place where you live?: Yes      Need for Family Participation in Patient Care: Yes (Comment)(assistance  with daily cares and supervision) Care giver support system in place?: Yes (comment)(pt children; facility staff) Current home services: DME Criminal Activity/Legal Involvement Pertinent to Current Situation/Hospitalization: No - Comment as needed   Permission Sought/Granted Permission sought to share information with : Facility Sport and exercise psychologist, Family Supports Permission granted to share information with : No(pt with fluctuating orientation)  Share Information with NAME: Sadiki Lollie  Permission granted to share info w AGENCY: [place] granted to share info w Relationship: son  Permission granted to share info w Contact Information: [phone]  Emotional Assessment Appearance:: Other (Comment Required(telephonic assessment) Attitude/Demeanor/Rapport: (telephonic assessment) Affect (typically observed): (telephonic assessment) Orientation: : Oriented to Self, Oriented to Place, Fluctuating Orientation (Suspected and/or reported [place]) Alcohol / Substance Use: Not Applicable Psych Involvement: No (comment)  Admission diagnosis:  Esophageal dysphagia [R13.10] Hypokalemia [E87.6] Acute cystitis without hematuria [N30.00] Dysphagia [R13.10] Patient Active Problem List   Diagnosis Date Noted  . Acute esophagitis   . Odynophagia 11/27/2019  . Acute cystitis without hematuria   . Dysphagia 11/25/2019  . DNR (do not resuscitate) 10/25/2019  . Acute respiratory failure with hypoxia ([place]) 10/24/2019  . Pneumonia due to COVID-19 virus 10/24/2019  . Barrett's esophagus 04/03/2018  . Candida esophagitis ([place]) 04/03/2018  . Anxiety 04/03/2018  . Esophageal dysfunction 03/23/2018  . Urinary tract infection associated with catheterization of urinary tract, initial encounter ([place]) 03/23/2018  . Leukocytosis 03/23/2018  . Dementia ([place]) 03/23/2018  . Dehydration   . Acute metabolic encephalopathy   . Nausea and vomiting 03/22/2018  . Pseudomonas urinary  tract infection 03/06/2018  . Depression 03/06/2018  . Anxiety  and depression 03/06/2018  . Osteoporosis 03/06/2018  . Acute lower UTI 03/01/2018  . Internal device, implant, or graft infection or inflammation 03/01/2018  . Emphysema   . Thyroid nodule   . Carotid artery disease ([place])   . CAD (coronary artery disease)   . Edema extremities   . Claudication ([place]) 07/30/2011  . Dyslipidemia 07/03/2010  . HYPERTENSION, BENIGN 07/03/2010  . PERIPHERAL EDEMA 07/03/2010  . SHORTNESS OF BREATH 07/03/2010  . PRECORDIAL PAIN 07/03/2010   PCP:  System, Pcp Not In Pharmacy:  No Pharmacies Listed   Readmission Risk Interventions Readmission Risk Prevention Plan 11/28/2019  Transportation Screening Complete  PCP or Specialist Appt within 3-5 Days Not Complete  Not Complete comments pt is SNF resident  [facility] or [place] Not Complete  [place] or Home Care Consult comments pt is SNF resident  Social Work Consult for [place] Planning/Counseling Complete  Palliative Care Screening Not Applicable  Medication Review (RN Care Manager) Referral to Pharmacy  Some recent data might be hidden

## 2019-11-28 NOTE — Progress Notes (Addendum)
Full EGD report available in EPIC. May advance diet as tolerated.   Results reviewed with the patient's son, Link Mcfeeley, by telephone. All questions answered to his satisfaction.  He was appreciative for the call.   Dr. Heber [place] updated.  GI will move to stand-by. Please call the on-call gastroenterologist with any additional questions or concerns during this hospitalization.

## 2019-11-28 NOTE — Interval H&P Note (Signed)
History and Physical Interval Note:  11/28/2019 10:33 AM  Warren [name]  has presented today for surgery, with the diagnosis of Dysphagia/odynophagia.  The various methods of treatment have been discussed with the patient and family. After consideration of risks, benefits and other options for treatment, the patient has consented to  Procedure(s): ESOPHAGOGASTRODUODENOSCOPY (EGD) (Left) as a surgical intervention.  The patient's history has been reviewed, patient examined, no change in status, stable for surgery.  I have reviewed the patient's chart and labs.  Questions were answered to the patient's satisfaction.     Thornton Park

## 2019-11-29 DIAGNOSIS — Z886 Allergy status to analgesic agent status: Secondary | ICD-10-CM

## 2019-11-29 DIAGNOSIS — K209 Esophagitis, unspecified without bleeding: Secondary | ICD-10-CM

## 2019-11-29 DIAGNOSIS — K228 Other specified diseases of esophagus: Secondary | ICD-10-CM

## 2019-11-29 DIAGNOSIS — Z91040 Latex allergy status: Secondary | ICD-10-CM

## 2019-11-29 DIAGNOSIS — Z880 Allergy status to penicillin: Secondary | ICD-10-CM

## 2019-11-29 DIAGNOSIS — Z881 Allergy status to other antibiotic agents status: Secondary | ICD-10-CM

## 2019-11-29 LAB — MAGNESIUM: Magnesium: 1.8 mg/dL (ref 1.7–2.4)

## 2019-11-29 LAB — CBC
HCT: 36.4 % — ABNORMAL LOW (ref 39.0–52.0)
Hemoglobin: 11.8 g/dL — ABNORMAL LOW (ref 13.0–17.0)
MCH: 29.8 pg (ref 26.0–34.0)
MCHC: 32.4 g/dL (ref 30.0–36.0)
MCV: 91.9 fL (ref 80.0–100.0)
Platelets: 195 10*3/uL (ref 150–400)
RBC: 3.96 MIL/uL — ABNORMAL LOW (ref 4.22–5.81)
RDW: 14.9 % (ref 11.5–15.5)
WBC: 8.1 10*3/uL (ref 4.0–10.5)
nRBC: 0 % (ref 0.0–0.2)

## 2019-11-29 LAB — BASIC METABOLIC PANEL
Anion gap: 9 (ref 5–15)
BUN: <5 mg/dL — ABNORMAL LOW (ref 8–23)
CO2: 31 mmol/L (ref 22–32)
Calcium: 7.6 mg/dL — ABNORMAL LOW (ref 8.9–10.3)
Chloride: 101 mmol/L (ref 98–111)
Creatinine, Ser: 0.46 mg/dL — ABNORMAL LOW (ref 0.61–1.24)
GFR calc Af Amer: >60 mL/min (ref >60)
GFR calc non Af Amer: >60 mL/min (ref >60)
Glucose, Bld: 116 mg/dL — ABNORMAL HIGH (ref 70–99)
Potassium: 3.2 mmol/L — ABNORMAL LOW (ref 3.5–5.1)
Sodium: 141 mmol/L (ref 135–145)

## 2019-11-29 LAB — NOVEL CORONAVIRUS, NAA (HOSP ORDER, SEND-OUT TO REF LAB; TAT 18-24 HRS): SARS-CoV-2, NAA: NOT DETECTED

## 2019-11-29 MED ORDER — PANTOPRAZOLE SODIUM 40 MG PO TBEC: 40.0000 mg | DELAYED_RELEASE_TABLET | Freq: Two times a day (BID) | ORAL | 0 refills | Status: DC

## 2019-11-29 MED ORDER — CEFDINIR 300 MG PO CAPS: 600.0000 mg | ORAL_CAPSULE | Freq: Every day | ORAL | 0 refills | Status: AC

## 2019-11-29 MED ORDER — PANTOPRAZOLE SODIUM 40 MG IV SOLR: 40.0000 mg | Freq: Two times a day (BID) | INTRAVENOUS | Status: DC

## 2019-11-29 MED ORDER — POTASSIUM CHLORIDE 10 MEQ/100ML IV SOLN
10.0000 meq | INTRAVENOUS | Status: AC
  Administered 2019-11-29 (×6): 10 meq via INTRAVENOUS
  Filled 2019-11-29 (×5): qty 100

## 2019-11-29 MED ORDER — SUCRALFATE 1 GM/10ML PO SUSP: 1.0000 g | Freq: Three times a day (TID) | ORAL | 0 refills | Status: DC

## 2019-11-29 NOTE — TOC Transition Note (Addendum)
Transition of Care [facility]) - CM/SW Discharge Note   Patient Details  Name: Warren [name] MRN: [id number] Date of Birth: [birth year]  [facility] of Care [facility]) CM/SW Contact:  Alexander Mt, [place] Phone Number: 11/29/2019, 11:21 AM   Clinical Narrative:    12:56pm- CSW spoke with pt son via telephone, he is aware and agreeable to pt discharge- he is out of town currently but okay with return to SNF today.   11:21am- CSW spoke with Bedelia Person at [facility], pt able to return today. They are ready and aware his new COVID swab was negative. CSW called and left message requesting call back from pt son Tim regarding discharge timing. Will speak with Tim before arranging PTAR for this pt. Will need summary, orders, and any controlled meds signed and printed.   CSW has completed PTAR, ensure pt discharges with DNR signed.   Final next level of care: Skilled Nursing Facility Barriers to Discharge: Barriers Resolved   Patient Goals and CMS Choice Patient states their goals for this hospitalization and ongoing recovery are:: return to SNF when ready CMS Medicare.gov Compare Post Acute Care list provided to:: Patient Represenative (must comment)(pt [name]) Choice offered to / list presented to : Adult Children  Discharge Placement   Existing PASRR number confirmed : 11/28/19          Patient chooses bed at: [facility] Patient to be transferred to facility by: [place] Name of family member notified: pt son Tim Patient and family notified of of transfer: 11/29/19  Discharge Plan and Services In-house Referral: Clinical Social Work Discharge Planning Services: CM Consult Post Acute Care Choice: Resumption of Svcs/PTA Provider, [place]            Readmission Risk Interventions Readmission Risk Prevention Plan 11/28/2019  Transportation Screening Complete  PCP or Specialist Appt within 3-5 Days Not Complete  Not Complete comments pt is SNF resident  [facility] or  [place] Not Complete  [place] or Home Care Consult comments pt is SNF resident  Social Work Consult for [place] Planning/Counseling Complete  Palliative Care Screening Not Applicable  Medication Review (RN Care Manager) Referral to Pharmacy  Some recent data might be hidden

## 2019-11-29 NOTE — Anesthesia Postprocedure Evaluation (Signed)
Anesthesia Post Note  Patient: Warren [name]  Procedure(s) Performed: ESOPHAGOGASTRODUODENOSCOPY (EGD) (Left )     Patient location during evaluation: Endoscopy Anesthesia Type: MAC Level of consciousness: awake Pain management: pain level controlled Vital Signs Assessment: post-procedure vital signs reviewed and stable Respiratory status: spontaneous breathing, nonlabored ventilation, respiratory function stable and patient connected to nasal cannula oxygen Cardiovascular status: stable and blood pressure returned to baseline Postop Assessment: no apparent nausea or vomiting Anesthetic complications: no    Last Vitals:  Vitals:   11/29/19 0533 11/29/19 1501  BP: (!) 142/74 128/84  Pulse: 71 73  Resp: 18 17  Temp: 36.5 C 36.7 C  SpO2: 100% 99%    Last Pain:  Vitals:   11/29/19 1501  TempSrc: Oral  PainSc:                  [name]

## 2019-11-29 NOTE — Consult Note (Signed)
   [facility] CM Inpatient Consult   11/29/2019  Warren [name] 1922-07-12 [id number]   Patient screened for 28% high risk score for unplanned readmission with 2 hospitalizations and 2 ED visits in the past 6 months, as well as to check forpotential [place] Managementneeds under his Medicare/[employer] insurance plan.  Patient presented with dyspnea and was admitted to the hospital with a working diagnosis of acute hypoxic respiratory failure due to SARS COVID-19 viral pneumonia.  Review of patient's medical record reveals patient is from [facility] skilled nursing facility. Per transition of care SW note, patient is a resident at [facility] and will return back there at discharge.  Will sign off as patient's care will be met at that level of care.   For questions, please contact:   Edwena Felty A. Shreeya Recendiz, BSN, RN-BC [facility] Liaison Cell: [phone]

## 2019-11-29 NOTE — Social Work (Signed)
Clinical Social Worker facilitated patient discharge including contacting patient family and facility to confirm patient discharge plans.  Clinical information faxed to facility and family agreeable with plan.  CSW arranged ambulance transport via [facility] to [facility] room [phone] RN to call [phone]  with report prior to discharge.  Clinical Social Worker will sign off for now as social work intervention is no longer needed. Please consult [place] again if new need arises.  Westley Hummer, MSW, [place] Social Worker

## 2019-11-29 NOTE — Discharge Summary (Addendum)
Name: Warren [name] MRN: [id number] DOB: 1922/10/19 86 y.o. PCP: System, Pcp Not In  Date of Admission: 11/25/2019 11:46 AM Date of Discharge: 11/29/2019 Attending Physician: Lucious Groves, DO  Discharge Diagnosis: 1.  Presbyesophagus/Dysphagia/Esophagitis 2.  Recurrent UTI  Discharge Medications: Allergies as of 11/29/2019      Reactions   Aspirin Other (See Comments)   Reaction - spots   Cephalexin Other (See Comments)   Reported by [facility] 08/19/2018   Latex Other (See Comments)   Unknown reaction   Penicillins    Has patient had a PCN reaction causing immediate rash, facial/tongue/throat swelling, SOB or lightheadedness with hypotension:No Has patient had a PCN reaction causing severe rash involving mucus membranes or skin necrosis: No Has patient had a PCN reaction that required hospitalization:NoHas patient had a PCN reaction occurring within the last 10 years:No If all of the above answers are "NO", then may proceed with Cephalosporin use.      Medication List    STOP taking these medications   ertapenem  IVPB Commonly known as: INVANZ   levofloxacin 500 MG tablet Commonly known as: LEVAQUIN   LORazepam 0.5 MG tablet Commonly known as: ATIVAN   menthol-cetylpyridinium 3 MG lozenge Commonly known as: CEPACOL   omeprazole 20 MG tablet Commonly known as: PRILOSEC OTC   ondansetron 4 MG tablet Commonly known as: ZOFRAN   oxybutynin 5 MG tablet Commonly known as: DITROPAN     TAKE these medications   acetaminophen 500 MG tablet Commonly known as: TYLENOL Take 1,000 mg by mouth every 8 (eight) hours as needed for mild pain.   alendronate 70 MG tablet Commonly known as: FOSAMAX Take 70 mg by mouth every Wednesday. Take with a full glass of water on an empty stomach.   ascorbic acid 500 MG tablet Commonly known as: VITAMIN C Take 500 mg by mouth daily.   calcium carbonate 1250 (500 Ca) MG tablet Commonly known as: OS-CAL - dosed  in mg of elemental calcium Take 1 tablet by mouth 2 (two) times daily.   cefdinir 300 MG capsule Commonly known as: OMNICEF Take 2 capsules (600 mg total) by mouth daily for 10 days.   Cholecalciferol 50 MCG (2000 UT) Tabs Take 2,000 Units by mouth daily.   citalopram 20 MG tablet Commonly known as: CELEXA Take 20 mg by mouth daily.   dexamethasone 0.75 MG tablet Commonly known as: DECADRON Take 0.75 mg by mouth 2 (two) times daily with a meal.   fluticasone 50 MCG/ACT nasal spray Commonly known as: FLONASE Place 1 spray into both nostrils 2 (two) times daily.   guaiFENesin-dextromethorphan 100-10 MG/5ML syrup Commonly known as: ROBITUSSIN DM Take 5 mLs by mouth every 4 (four) hours as needed for cough.   Myrbetriq 25 MG Tb24 tablet Generic drug: mirabegron ER Take 25 mg by mouth daily.   pantoprazole 40 MG tablet Commonly known as: Protonix Take 1 tablet (40 mg total) by mouth 2 (two) times daily.   potassium chloride SA 20 MEQ tablet Commonly known as: KLOR-CON Take 20 mEq by mouth 2 (two) times daily.   pravastatin 40 MG tablet Commonly known as: PRAVACHOL Take 40 mg by mouth daily.   promethazine 25 MG suppository Commonly known as: PHENERGAN Place 25 mg rectally every 6 (six) hours as needed for nausea or vomiting.   rivaroxaban 20 MG Tabs tablet Commonly known as: XARELTO Take 20 mg by mouth daily with supper. What changed: Another medication with the same name  was removed. Continue taking this medication, and follow the directions you see here.   sucralfate 1 GM/10ML suspension Commonly known as: CARAFATE Take 10 mLs (1 g total) by mouth 4 (four) times daily -  with meals and at bedtime for 14 days.   tamsulosin 0.4 MG Caps capsule Commonly known as: FLOMAX Take 0.4 mg by mouth daily.      Disposition and follow-up:   Warren [name] was discharged from [facility] in [place] condition.  At the hospital follow up visit please  address:  1.  Presbyesophagus/Dysphagia/Esophagitis: Patient had EGD on 1/23 and 1/25 which demonstrated the use food obstructions and diffuse esophagitis.  GI recommended PO Protonix 40 mg twice daily for 12 weeks and Carafate 1 g oral suspension 4 times daily for 2 weeks. -GI considering repeat EGD in 3 months.  Is unsure GI follows up with the patient.  2.  Recurrent UTI: Recurrent Proteus mirabilis, we secondary to chronic Foley. This was changed on hospitalization day 1.  Multiantibiotic resistance but found to be sensitive to ceftriaxone.  Plan is to continue cefdinir 600 mg daily to complete 14-day course.  Patient continues to have suprapubic tenderness, but no signs of sepsis. -Cefdinir 600 mg daily (stop date 2/5)  3.  Labs / imaging needed at time of follow-up: None  4.  Pending labs/ test needing follow-up: none  Follow-up Appointments:   Hospital Course by problem list: 1.  Presbyesophagus/Dysphagia/Esophagitis 2.  Recurrent UTI  In summary, Warren [name] is a 86 year old gentleman with a past medical history significant for dementia, CAD s/p cardiac cath in 2008, carotid stenosis, PVD, COPD, Covid pneumonia in late December complicated by DVT, glaucoma, HLD, prostate cancer status post radiation and TURP procedure, Barrett's esophagus, and chronic intermittent dysphagia secondary to esophageal stricture and stenosis (dilated in May 2019) who presented to the hospital with a 4 to 5-day history of decreased p.o. intake and recurrent Proteus mirabilis UTI.  EGD performed by GI on 1/23 demonstrated diffuse food bolus is stuck in the patient's esophagus. Food particles were removed and EGD was repeated on 1/25 which demonstrated diffuse esophagitis.  GI evaluated the patient and recommended PO Protonix 40 mg twice daily for 12 weeks and Carafate 1 g oral suspension 4 times daily for 2 weeks. They also recommended potentially repeating EGD in 3 months.  Of note, the patient had an active  DVT secondary to a Covid infection 1 month ago that was being treated with Xarelto.  This was discontinued during the hospitalization the patient was on a heparin drip, which was turned off prior to EGD's.  Patient was started on Xarelto again on 1/26.  As for the UTI, high suspicion that this was secondary to chronic Foley use.  His Foley catheter was removed and started on IV ceftriaxone.  Urine cultures grew Proteus mirabilis was shown to have multiantibiotic resistance, but did show sensitivity ceftriaxone. The patient received IV ceftriaxone for 4 days and was subsequently discharged with oral cefdinir 600 mg daily to complete a 14-day course.  Stop date 2/5.  Discharge Vitals:   BP (!) 142/74 (BP Location: Left Arm)    Pulse 71    Temp 97.7 F (36.5 C) (Oral)    Resp 18    Ht 5\' 7"  (1.702 m)    Wt 67.7 kg    SpO2 100%    BMI 23.38 kg/m   Pertinent Labs, Studies, and Procedures:  CBC Latest Ref Rng & Units 11/29/2019 11/28/2019  11/27/2019  WBC 4.0 - 10.5 K/uL 8.1 7.6 8.9  Hemoglobin 13.0 - 17.0 g/dL 11.8(L) 11.4(L) 11.0(L)  Hematocrit 39.0 - 52.0 % 36.4(L) 35.2(L) 35.0(L)  Platelets 150 - 400 K/uL 195 186 188   BMP Latest Ref Rng & Units 11/29/2019 11/28/2019 11/28/2019  Glucose 70 - 99 mg/dL 116(H) 125(H) 195(H)  BUN 8 - 23 mg/dL <5(L) <5(L) <5(L)  Creatinine 0.61 - 1.24 mg/dL 0.46(L) 0.42(L) 0.49(L)  Sodium 135 - 145 mmol/L 141 139 140  Potassium 3.5 - 5.1 mmol/L 3.2(L) 3.8 2.9(L)  Chloride 98 - 111 mmol/L 101 101 101  CO2 22 - 32 mmol/L 31 32 30  Calcium 8.9 - 10.3 mg/dL 7.6(L) 7.5(L) 7.4(L)   Urinalysis    Component Value Date/Time   COLORURINE AMBER (A) 11/25/2019 1332   APPEARANCEUR TURBID (A) 11/25/2019 1332   LABSPEC 1.025 11/25/2019 1332   PHURINE 7.0 11/25/2019 1332   GLUCOSEU NEGATIVE 11/25/2019 1332   HGBUR MODERATE (A) 11/25/2019 1332   BILIRUBINUR NEGATIVE 11/25/2019 1332   KETONESUR 20 (A) 11/25/2019 1332   PROTEINUR 100 (A) 11/25/2019 1332   NITRITE POSITIVE (A)  11/25/2019 1332   LEUKOCYTESUR MODERATE (A) 11/25/2019 1332   Urine cultures: 60,000 colonies/mL Proteus mirabilis  EGD 1/23: IMPRESSION: -Dilation in the entire esophagus with solid food debris noted along the entire length of the esophagus; large bolus removed with a Talon forceps and multiple smaller pieces of debris/food removed by lavage and by suctioning up the debris to the tip the scope. Most of -The debris was cleared from the esophagus but the distal esophageal mucosa appeared macerated and the friable. There appeared to some stenosis at the GEJ but this was not dilated today. -Debris noted the stomach as well; the entire gastric mucosa was not visualized and the duodenum was not intubated.  EGD 1/25: IMPRESSION: - LA Grade D reflux esophagitis with no bleeding with some likely GERD-related dysmotility. - Otherwise normal examination. - No specimens collected. - Underlying presbyesophagus noted on prior imaging  Discharge Instructions: Discharge Instructions    Call MD for:  difficulty breathing, headache or visual disturbances   Complete by: As directed    Call MD for:  extreme fatigue   Complete by: As directed    Call MD for:  hives   Complete by: As directed    Call MD for:  persistant dizziness or light-headedness   Complete by: As directed    Call MD for:  persistant nausea and vomiting   Complete by: As directed    Call MD for:  redness, tenderness, or signs of infection (pain, swelling, redness, odor or green/yellow discharge around incision site)   Complete by: As directed    Call MD for:  severe uncontrolled pain   Complete by: As directed    Call MD for:  temperature >100.4   Complete by: As directed    Diet - low sodium heart healthy   Complete by: As directed    Discharge instructions   Complete by: As directed    Thank you for allowing [place] to take care of you during your hospitalization.  Below is a summary of what we treated:  1.  Difficulty  swallowing -You had 2 endoscopies to evaluate your swallowing.  Food was getting stuck in your esophagus and it appeared inflamed. -The GI doctors would like to follow-up with you in 3 months to repeat the endoscopy to see how you are doing. -In the meantime, take Protonix 40 mg twice daily for  3 months and Carafate 4 times a day for 2 weeks to treat this.  2.  Urinary tract infection -You had a bacteria called Proteus mirabilis growing in your urine. -Take cefdinir 600 mg daily for the next 10 days.  Your last dose of this medication will be on February 5.  3.  Follow-up -Please follow-up with your primary care provider within 2 weeks of being discharged from the hospital. -Please follow-up with the GI doctors as they have instructed.  If you have any questions or concerns, please feel free to reach out to [place].   Increase activity slowly   Complete by: As directed      Signed: Earlene Plater, MD Internal Medicine, PGY1 Pager: [phone]  11/29/2019,11:34 AM

## 2019-11-29 NOTE — Progress Notes (Signed)
Patient discharged to [facility] via [facility], discharge documents given to [facility] staff, VSS and report given to [facility] RN earlier by day shift RN.

## 2019-11-29 NOTE — Progress Notes (Signed)
Subjective:  Pt seen at the bedside this AM. Pt had EGD done yesterday with GI and tolerated the procedure well.  Patient needs to have some suprapubic tenderness, but otherwise doing fine.  No other complaints at this time.  Objective:  Vital signs in last 24 hours: Vitals:   11/28/19 1135 11/28/19 1500 11/28/19 2153 11/29/19 0533  BP: (!) 141/78 131/85 110/74 (!) 142/74  Pulse: 70 76 78 71  Resp: 18 18 18 18   Temp:  97.6 F (36.4 C) 98.3 F (36.8 C) 97.7 F (36.5 C)  TempSrc:  Oral Oral Oral  SpO2: 100% 100% 100% 100%  Weight:      Height:       Physical Exam Const: Resting in bed comfortably, NAD CV: RRR, normal S1-S2 no murmurs rubs or gallops appreciated PULM: Clear to auscultation bilaterally, no crackles or wheezes appreciated ABD: Bowel sounds present, nondistended, nontender to palpation GU: Suprapubic tenderness to palpation  Assessment/Plan:  Principal Problem:   Dysphagia Active Problems:   Acute cystitis without hematuria   Odynophagia   Acute esophagitis  Mr. Kosick is a 86 year old gentleman with a past medical history significant fordementia,CAD s/p cardiac cath in 2008, carotid stenosis, PVD, COPD,Covid pneumonia in late December,glaucoma, HLD, prostate cancer status post radiation and TURP procedure, Barrett's esophagus, and chronic intermittent dysphagia secondary to esophageal stenosis (dilated in May 2019)who presented with a 4 to 5-day history of decreased p.o. intake in addition to recurrent UTI.  EGD performed by GI demonstrated food boluses stuck in the patient's esophagus as well as diffuse esophagitis. The patient is medically stable for discharge at this point.  #HxEsophagealStrictures #Hx Barrett's Esophagus #Hx Presbyesophagus #Dysphagia #Odynophagia: EGD was performed on 1/23 with GI and with demonstrated dilation in the entire esophagus with solid food debris noted along the entire length. There was stenosis at the GEJ but  this was not dilated.   Repeat EGD was performed on 1/25 which demonstrated diffuse esophagitis. -Appreciate GI recommendations -Restart home Xarelto 20 mg daily today  -Protonix 40 mg twice daily for 12 weeks  -Carafate 1 g oral suspension 3 times daily for 2 weeks  -Consider repeat EGD in 3 months              #RecurentUTI:Patient's had recurrent UTIs over the past month. Urine cultures on12/31 and1/7 grew Proteus mirabilis. Patient had an indwelling foley for the past 2 weeks due to urinary incontinence prior to hospitalization. After his ED visit on 12/31, there are mixed notes stating that he took Bactrim and ciprofloxacin. After his visit on 1/7, the notes state that he took meropenem for 7 days. UCx this admission confirmed suspected ongoing Proteus mirabilis infection. Resistant to many abx, but sensitive to ceftriaxone. -Replaced indwelling Foley with condom catheter on HOD1 -Transition from IV ceftriaxone to cefdinir to complete 14-day course of ABX. Stop date 2/5.   #History of dementia:Mental status is currently at baseline. Son states that he tends to get more confused later in the day,but generally mostly oriented. -Delirium precautions -Redirect as needed -Ativan 0.25 mgPRN  #FEN/GI #Hypokalemia -Diet:NPO, resume liquid diet after procedure -Fluids:D5 LR 100 cc/hr -K+ 3.2, repleted  #Hx DVT #DVT prophylaxis:Patient has been taking Xarelto for DVTdeveloped after being hospitalized for COVID-19 infection at the end of December. -Xarelto 20 mg daily   #CODE STATUS: DNR  #Dispo Prior to Admission Living Arrangement: [place] home Anticipated Discharge Location: [facility] nursing home Barriers to Discharge: Discharge today.   Earlene Plater,  MD Internal Medicine, PGY1 Pager: [phone]  11/29/2019,11:04 AM

## 2019-11-30 ENCOUNTER — Encounter: Payer: Self-pay | Admitting: *Deleted

## 2019-12-01 ENCOUNTER — Encounter: Payer: Self-pay | Admitting: Nurse Practitioner

## 2019-12-29 ENCOUNTER — Telehealth: Payer: Self-pay

## 2019-12-29 ENCOUNTER — Other Ambulatory Visit: Payer: Self-pay

## 2019-12-29 ENCOUNTER — Ambulatory Visit (INDEPENDENT_AMBULATORY_CARE_PROVIDER_SITE_OTHER): Payer: Medicare Other | Admitting: Nurse Practitioner

## 2019-12-29 ENCOUNTER — Encounter: Payer: Self-pay | Admitting: Nurse Practitioner

## 2019-12-29 VITALS — BP 100/60 | HR 84 | Temp 97.4°F

## 2019-12-29 DIAGNOSIS — Z7901 Long term (current) use of anticoagulants: Secondary | ICD-10-CM

## 2019-12-29 DIAGNOSIS — K209 Esophagitis, unspecified without bleeding: Secondary | ICD-10-CM | POA: Diagnosis not present

## 2019-12-29 DIAGNOSIS — T18128S Food in esophagus causing other injury, sequela: Secondary | ICD-10-CM

## 2019-12-29 NOTE — Patient Instructions (Addendum)
If you are age 84 or older, your body mass index should be between 23-30. Your There is no height or weight on file to calculate BMI. If this is out of the aforementioned range listed, please consider follow up with your Primary Care Provider.  If you are age 3 or younger, your body mass index should be between 19-25. Your There is no height or weight on file to calculate BMI. If this is out of the aformentioned range listed, please consider follow up with your Primary Care Provider.   We will contact your regarding EGD with Dilation at [facility] with Dr. Ardis Hughs.   Continue puree diet.  Start liquid diet three days prior to procedure.  Recommend stopping Fosamax due to swallowing problems.  Continue Protonix twice daily.  Thank you for choosing me and [facility].   Tye Savoy, NP

## 2019-12-29 NOTE — Telephone Encounter (Signed)
Per Nevin Bloodgood the pt needs to be scheduled for early April

## 2019-12-29 NOTE — Progress Notes (Signed)
IMPRESSION and PLAN:    Warren [name] is a 86 y.o. male with a pmh significant for, not necessarily limited to advanced dementia, prostate cancer, CAD, carotid artery disease, emphysema, DVT on Xarelto, recurrent UTIs   # Food impaction late January (extensive debris in esophagus) with Grade D esophagitis. Barrett's esophagus not excluded -Esophageal dysmotility and possible distal esophageal stricture as cause for impaction. He has a hx of esophageal stenosis on EGD May 2019. Also at that time appeared endoscopically to have Barrett's esophagus but with acute and chronic inflammation on biospies.  -Continue BID PPI -Needs follow up EGD ( at Hospital) to document healing and possible esophageal dilation. The risks and benefits of EGD were discussed and the patient agrees to proceed. Will schedule this to be done late March / early April -continue pureed diet for now. He really wants solid food. Clear liquids 3 days prior to EGD to make sure esophagus clear of food debris.   # Chronic anticoagulation.  On Xarelto for DVT -Hold Xarelto for 2 days before procedure - will instruct when and how to resume after procedure. Patient understands that there is a low but real risk of cardiovascular event such as heart attack, stroke, or embolism /  thrombosis while off blood thinner. The patient consents to proceed. Will communicate by phone or EMR with patient's prescribing provider to confirm that holding Xarelto is reasonable in this case.  HPI:    Primary GI: Dr. Ardis Hughs  Chief complaint : Hospital follow-up after food impaction  **History comes from the chart, patient ( hard of hearing) and daughter  86 year old male hospitalized in January with poor p.o. intake, regurgitation, odynophagia.  Inpatient EGD 11/24/19 ( Dr. Collene Mares) remarkable for severe food impaction with debris from the upper esophageal sphincter to the lower esophageal sphincter.   Patient had to be intubated for  orogastric lavage during the inpatient EGD.  Excessive lavage was performed uncovering a macerated distal esophagus and somewhat stenosed GE junction. Follow up EGD 2 days later  ( Dr. Tarri Glenn) remarkable for Grade D esophagitis. Underlying presbyesophagus on prior imaging. There was some resistance with passage of the gastroscope through the most distal esophagus suggesting esophageal stricture.  Started on twice daily PPI and Carafate.  Follow-up EGD recommended to document healing.  Patient is here with his daughter for hospital follow-up.  He is tolerating a pured diet, asks for solid food. No specific complaints.   Review of systems:     No chest pain, no SOB, no fevers, no urinary sx   Past Medical History:  Diagnosis Date  . CAD (coronary artery disease)    moderate by catheterization,11/08...essentially unchanged by repeat study,10/11  . Carotid artery disease (HCC)     less than 50% stenosis bilaterally March 2013   . Claudication ([place])     ABIs within ABIs within normal limits, 40% left iliac disease by catheterization   . Colon polyps   . COVID-19 10/2019  . Depression   . Dizzy   . Edema extremities     secondary to severe varicose veins. Nonpitting edema and dermatitis.   . Emphysema   . Glaucoma   . Hyperlipidemia   . IBS (irritable bowel syndrome)   . Mitral regurgitation    mild  . Mixed dyslipidemia   . Peripheral edema   . Pneumonia   . Prostate cancer [facility])    s/p radiation treatment/s/p TURP  . Shortness of breath  Emphysema  . Thyroid nodule     posterior and inferior to the right lobepossibly outside of the gland secondary to parathyroid adenoma in addition multiple bilateral thyroid nodules     Patient's surgical history, family medical history, social history, medications and allergies were all reviewed in Epic   Creatinine clearance cannot be calculated (Patient's most recent lab result is older than the maximum 21 days allowed.)  Current Outpatient  Medications  Medication Sig Dispense Refill  . acetaminophen (TYLENOL) 500 MG tablet Take 1,000 mg by mouth every 8 (eight) hours as needed for mild pain.    Marland Kitchen alendronate (FOSAMAX) 70 MG tablet Take 70 mg by mouth every Wednesday. Take with a full glass of water on an empty stomach.    Marland Kitchen ascorbic acid (VITAMIN C) 500 MG tablet Take 500 mg by mouth daily.    . calcium carbonate (OS-CAL - DOSED IN MG OF ELEMENTAL CALCIUM) 1250 (500 Ca) MG tablet Take 1 tablet by mouth 2 (two) times daily.    . Cholecalciferol 50 MCG (2000 UT) TABS Take 2,000 Units by mouth daily.     . citalopram (CELEXA) 20 MG tablet Take 20 mg by mouth daily.     . fluticasone (FLONASE) 50 MCG/ACT nasal spray Place 1 spray into both nostrils 2 (two) times daily. 16 g 0  . guaiFENesin-dextromethorphan (ROBITUSSIN DM) 100-10 MG/5ML syrup Take 5 mLs by mouth every 4 (four) hours as needed for cough. 118 mL 0  . Melatonin 5 MG TABS Take 1 tablet by mouth at bedtime.    . mirabegron ER (MYRBETRIQ) 25 MG TB24 tablet Take 25 mg by mouth daily.    . pantoprazole (PROTONIX) 40 MG tablet Take 1 tablet (40 mg total) by mouth 2 (two) times daily. 240 tablet 0  . potassium chloride SA (KLOR-CON) 20 MEQ tablet Take 20 mEq by mouth 2 (two) times daily.    . pravastatin (PRAVACHOL) 40 MG tablet Take 40 mg by mouth daily.     . promethazine (PHENERGAN) 25 MG suppository Place 25 mg rectally every 6 (six) hours as needed for nausea or vomiting.    . rivaroxaban (XARELTO) 20 MG TABS tablet Take 20 mg by mouth daily with supper.    . sucralfate (CARAFATE) 1 GM/10ML suspension Take 10 mLs (1 g total) by mouth 4 (four) times daily -  with meals and at bedtime for 14 days. 420 mL 0  . tamsulosin (FLOMAX) 0.4 MG CAPS capsule Take 0.4 mg by mouth daily.     No current facility-administered medications for this visit.    Physical Exam:     BP 100/60   Pulse 84   Temp (!) 97.4 F (36.3 C)   GENERAL:  Pleasant male in NAD in wheelchair. Hard of  hearing  PSYCH: : Cooperative, normal affect CARDIAC:  RRR, 2+ BLE edema PULM: Normal respiratory effort, lungs CTA bilaterally, no wheezing ABDOMEN:  Nondistended, soft, nontender. Normal bowel sounds SKIN:  turgor, no lesions seen Musculoskeletal:  Normal muscle tone, normal strength NEURO: Alert and oriented x 3, no focal neurologic deficits   Tye Savoy , NP 12/29/2019, 11:28 AM

## 2019-12-29 NOTE — Telephone Encounter (Signed)
-----   Message from Lowell Guitar, [place] sent at 12/29/2019 11:38 AM EST ----- Regarding: EGD WITH DIL AT [facility]  PT ON [address] per Dr Ardis Hughs please schedule this patient for hosp.  EGD w/ Dil - Pt is on Xarelto. (Vemezela Slade-Hartman  PER PAULA - pt MUST be a clear liquid diet three days prior to procedure.  Thanks

## 2019-12-29 NOTE — Progress Notes (Signed)
I agree with the above note, plan 

## 2019-12-30 NOTE — Telephone Encounter (Signed)
Pt will be contacted for appt in early April

## 2020-01-30 ENCOUNTER — Telehealth: Payer: Self-pay

## 2020-01-30 NOTE — Telephone Encounter (Signed)
-----   Message from Timothy Lasso, RN sent at 12/30/2019 12:06 PM EST ----- Per Nevin Bloodgood the pt needs to be scheduled for early April   Me     11:54 AM Note    ----- Message from Lowell Guitar, RMA sent at 12/29/2019 11:38 AM EST ----- Regarding: EGD WITH DIL AT [facility]  PT [facility] per Dr Ardis Hughs please schedule this patient for hosp.  EGD w/ Dil - Pt is on Xarelto. (Vemezela Slade-Hartman  PER PAULA - pt MUST be a clear liquid diet three days prior to procedure.  Thanks

## 2020-01-31 ENCOUNTER — Other Ambulatory Visit: Payer: Self-pay

## 2020-01-31 DIAGNOSIS — R1319 Other dysphagia: Secondary | ICD-10-CM

## 2020-01-31 DIAGNOSIS — K22719 Barrett's esophagus with dysplasia, unspecified: Secondary | ICD-10-CM

## 2020-01-31 DIAGNOSIS — R1310 Dysphagia, unspecified: Secondary | ICD-10-CM

## 2020-01-31 DIAGNOSIS — K224 Dyskinesia of esophagus: Secondary | ICD-10-CM

## 2020-01-31 DIAGNOSIS — T18128S Food in esophagus causing other injury, sequela: Secondary | ICD-10-CM

## 2020-01-31 DIAGNOSIS — K209 Esophagitis, unspecified without bleeding: Secondary | ICD-10-CM

## 2020-01-31 NOTE — Telephone Encounter (Signed)
The pt currently resides at [facility].  I have tried to reach that facility and all numbers listed are not working numbers.  I called the director and received a voice mail.  I have spoken with the pt's son and he is aware of the procedure. Left message on machine to call back

## 2020-01-31 NOTE — Telephone Encounter (Signed)
The pt has been scheduled for EGD at [facility] with Dr Ardis Hughs COVID testing on 4/19 at 1105 am.  Need xarelto clearance

## 2020-01-31 NOTE — Telephone Encounter (Signed)
I have spoken with Bedelia Person at [facility] and she provided the fax number to fax the EGD instructions as well as COVID appt information.  Records faxed.

## 2020-02-17 ENCOUNTER — Telehealth: Payer: Self-pay | Admitting: Gastroenterology

## 2020-02-17 NOTE — Telephone Encounter (Signed)
Wanda at the pt's facility was advised that the pt will need to come to our location for the COVID testing.  She was given the appt date, time and location.  The pt has been advised of the information and verbalized understanding.

## 2020-02-20 ENCOUNTER — Other Ambulatory Visit (HOSPITAL_COMMUNITY)
Admission: RE | Admit: 2020-02-20 | Discharge: 2020-02-20 | Disposition: A | Discharge status: Home / Self Care | Payer: Medicare Other | Source: Ambulatory Visit | Attending: Gastroenterology | Admitting: Gastroenterology

## 2020-02-20 DIAGNOSIS — Z20822 Contact with and (suspected) exposure to covid-19: Secondary | ICD-10-CM | POA: Insufficient documentation

## 2020-02-20 DIAGNOSIS — Z01812 Encounter for preprocedural laboratory examination: Secondary | ICD-10-CM | POA: Diagnosis present

## 2020-02-20 LAB — SARS CORONAVIRUS 2 (TAT 6-24 HRS): SARS Coronavirus 2: NEGATIVE

## 2020-02-20 NOTE — Progress Notes (Signed)
Preop instructions for:  Warren [name]        Date of Birth Nov 21, 1921                            Date of Procedure:02/23/20        Doctor: Dr Ardis Hughs  Time to arrive at [place] am Report to: Admitting  Procedure:EGD    Do not eat or drink past midnight the night before your procedure.(To include any tube feedings-must be discontinued)  Follow any preprocedure instructions per Dr Ardis Hughs   Take these morning medications only with sips of water.(or give through gastrostomy or feeding tube). If takes these medications in the am please take the following am of procedure:  Celexa, Flonase nasal spray, Myrbetriq, Pantoprazole, Flomax  Note: No Insulin or Diabetic meds should be given or taken the morning of the procedure!   Facility contact:[place]                   Phone: [phone]                 Health Care POA:  Transportation contact phone#:  Please send day of procedure:current med list and meds last taken that day, confirm nothing by mouth status from what time, Patient Demographic info( to include DNR status, problem list, allergies)   RN contact name/phone#:                             and Fax [employer] card and picture ID Leave all jewelry and other valuables at place where living( no metal or rings to be worn) No contact lens Women-no make-up, no lotions,perfumes,powders Men-no colognes,lotions  Any questions day of procedure,call  Endoscopy [phone]   Sent from :[facility]                   [place]                   [phone]  Sent by :   Louisa Second RN

## 2020-02-21 ENCOUNTER — Telehealth: Payer: Self-pay | Admitting: Gastroenterology

## 2020-02-21 NOTE — Telephone Encounter (Signed)
Pt is scheduled for a hospital procedure 02/23/20.  Earlene Plater, RN from nursing home called to inquire about holding Xarelto.  She stated that instruction letter does not indicate how many days to hold Xarelto and starting when.

## 2020-02-21 NOTE — Progress Notes (Signed)
Spoke with Federico Flake , nurse at [facility] - Confirmed receipt of preprocedure instructions from [facility]. .  Has preprocedure instructions also from office of Dr Ardis Hughs.  Informed Luana that preprocedure instructions for Xarelto will come from office of Dr Ardis Hughs.  Luana states she will call them today.  Nurse stated Joanna Hews- son would be person to sign consent.  I called Tim and he stated his sister, Lita Mains would be her day of procedure and sign consent.  LVMM for Lita Mains at [phone] for her to call [facility] in regards to signing of consent.

## 2020-02-21 NOTE — Telephone Encounter (Signed)
Lawana RN as notified to hold xarelto 2 day prior to the procedure.  The pt has been advised of the information and verbalized understanding.

## 2020-02-23 ENCOUNTER — Telehealth: Payer: Self-pay

## 2020-02-23 ENCOUNTER — Ambulatory Visit (HOSPITAL_COMMUNITY)
Admission: RE | Admit: 2020-02-23 | Discharge: 2020-02-23 | Disposition: A | Discharge status: Home / Self Care | Payer: Medicare Other | Attending: Gastroenterology | Admitting: Gastroenterology

## 2020-02-23 ENCOUNTER — Ambulatory Visit (HOSPITAL_COMMUNITY): Payer: Medicare Other | Admitting: Anesthesiology

## 2020-02-23 ENCOUNTER — Encounter (HOSPITAL_COMMUNITY): Payer: Self-pay | Admitting: Gastroenterology

## 2020-02-23 ENCOUNTER — Encounter (HOSPITAL_COMMUNITY)
Admission: RE | Disposition: A | Discharge status: Home / Self Care | Payer: Self-pay | Source: Home / Self Care | Attending: Gastroenterology

## 2020-02-23 ENCOUNTER — Other Ambulatory Visit: Payer: Self-pay

## 2020-02-23 DIAGNOSIS — K228 Other specified diseases of esophagus: Secondary | ICD-10-CM | POA: Diagnosis not present

## 2020-02-23 DIAGNOSIS — I2510 Atherosclerotic heart disease of native coronary artery without angina pectoris: Secondary | ICD-10-CM | POA: Insufficient documentation

## 2020-02-23 DIAGNOSIS — Z87891 Personal history of nicotine dependence: Secondary | ICD-10-CM | POA: Insufficient documentation

## 2020-02-23 DIAGNOSIS — Z8546 Personal history of malignant neoplasm of prostate: Secondary | ICD-10-CM | POA: Diagnosis not present

## 2020-02-23 DIAGNOSIS — Z880 Allergy status to penicillin: Secondary | ICD-10-CM | POA: Diagnosis not present

## 2020-02-23 DIAGNOSIS — K209 Esophagitis, unspecified without bleeding: Secondary | ICD-10-CM | POA: Diagnosis not present

## 2020-02-23 DIAGNOSIS — Z923 Personal history of irradiation: Secondary | ICD-10-CM | POA: Insufficient documentation

## 2020-02-23 DIAGNOSIS — Z881 Allergy status to other antibiotic agents status: Secondary | ICD-10-CM | POA: Diagnosis not present

## 2020-02-23 DIAGNOSIS — Z91040 Latex allergy status: Secondary | ICD-10-CM | POA: Insufficient documentation

## 2020-02-23 DIAGNOSIS — J439 Emphysema, unspecified: Secondary | ICD-10-CM | POA: Diagnosis not present

## 2020-02-23 DIAGNOSIS — K22719 Barrett's esophagus with dysplasia, unspecified: Secondary | ICD-10-CM

## 2020-02-23 DIAGNOSIS — Z8249 Family history of ischemic heart disease and other diseases of the circulatory system: Secondary | ICD-10-CM | POA: Insufficient documentation

## 2020-02-23 DIAGNOSIS — Z886 Allergy status to analgesic agent status: Secondary | ICD-10-CM | POA: Diagnosis not present

## 2020-02-23 DIAGNOSIS — K224 Dyskinesia of esophagus: Secondary | ICD-10-CM

## 2020-02-23 DIAGNOSIS — R1310 Dysphagia, unspecified: Secondary | ICD-10-CM | POA: Insufficient documentation

## 2020-02-23 DIAGNOSIS — K2100 Gastro-esophageal reflux disease with esophagitis, without bleeding: Secondary | ICD-10-CM | POA: Insufficient documentation

## 2020-02-23 DIAGNOSIS — R1319 Other dysphagia: Secondary | ICD-10-CM

## 2020-02-23 DIAGNOSIS — T18128S Food in esophagus causing other injury, sequela: Secondary | ICD-10-CM

## 2020-02-23 HISTORY — PX: ESOPHAGOGASTRODUODENOSCOPY (EGD) WITH PROPOFOL: SHX5813

## 2020-02-23 SURGERY — ESOPHAGOGASTRODUODENOSCOPY (EGD) WITH PROPOFOL
Anesthesia: Monitor Anesthesia Care

## 2020-02-23 MED ORDER — SODIUM CHLORIDE 0.9 % IV SOLN: INTRAVENOUS | Status: DC

## 2020-02-23 MED ORDER — PROPOFOL 10 MG/ML IV BOLUS
INTRAVENOUS | Status: DC | PRN
  Administered 2020-02-23: 40 mg via INTRAVENOUS

## 2020-02-23 MED ORDER — LACTATED RINGERS IV SOLN
INTRAVENOUS | Status: DC
  Administered 2020-02-23: 09:00:00 1000 mL via INTRAVENOUS

## 2020-02-23 MED ORDER — PROPOFOL 500 MG/50ML IV EMUL
INTRAVENOUS | Status: AC
  Filled 2020-02-23: qty 50

## 2020-02-23 MED ORDER — PROPOFOL 500 MG/50ML IV EMUL
INTRAVENOUS | Status: DC | PRN
  Administered 2020-02-23: 80 ug/kg/min via INTRAVENOUS

## 2020-02-23 SURGICAL SUPPLY — 15 items

## 2020-02-23 NOTE — Anesthesia Preprocedure Evaluation (Signed)
Anesthesia Evaluation  Patient identified by MRN, date of birth, ID band Patient awake    Reviewed: Allergy & Precautions, NPO status , Patient's Chart, lab work & pertinent test results  History of Anesthesia Complications Negative for: history of anesthetic complications  Airway Mallampati: II  TM Distance: >3 FB Neck ROM: Full    Dental  (+) Edentulous Upper, Edentulous Lower, Dental Advisory Given   Pulmonary shortness of breath, COPD, Recent URI , Resolved, former smoker,    breath sounds clear to auscultation       Cardiovascular hypertension, (-) angina+ CAD   Rhythm:Regular     Neuro/Psych PSYCHIATRIC DISORDERS Anxiety Depression Dementia negative neurological ROS     GI/Hepatic Neg liver ROS, GERD  Medicated,  Endo/Other  negative endocrine ROS  Renal/GU negative Renal ROS     Musculoskeletal negative musculoskeletal ROS (+)   Abdominal   Peds  Hematology  (+) Blood dyscrasia, anemia , xarelto   Anesthesia Other Findings   Reproductive/Obstetrics                             Anesthesia Physical Anesthesia Plan  ASA: III  Anesthesia Plan: MAC   Post-op Pain Management:    Induction: Intravenous  PONV Risk Score and Plan: 1 and Propofol infusion and Treatment may vary due to age or medical condition  Airway Management Planned: Nasal Cannula  Additional Equipment: None  Intra-op Plan:   Post-operative Plan:   Informed Consent: I have reviewed the patients History and Physical, chart, labs and discussed the procedure including the risks, benefits and alternatives for the proposed anesthesia with the patient or authorized representative who has indicated his/her understanding and acceptance.     Dental advisory given  Plan Discussed with: CRNA and Surgeon  Anesthesia Plan Comments:         Anesthesia Quick Evaluation

## 2020-02-23 NOTE — Discharge Instructions (Signed)
YOU HAD AN ENDOSCOPIC PROCEDURE TODAY: Refer to the procedure report and other information in the discharge instructions given to you for any specific questions about what was found during the examination. If this information does not answer your questions, please call [place] office at [phone] to clarify.   YOU SHOULD EXPECT: Some feelings of bloating in the abdomen. Passage of more gas than usual. Walking can help get rid of the air that was put into your GI tract during the procedure and reduce the bloating. If you had a lower endoscopy (such as a colonoscopy or flexible sigmoidoscopy) you may notice spotting of blood in your stool or on the toilet paper. Some abdominal soreness may be present for a day or two, also.  DIET: Your first meal following the procedure should be a light meal and then it is ok to progress to your normal diet. A half-sandwich or bowl of soup is an example of a good first meal. Heavy or fried foods are harder to digest and may make you feel nauseous or bloated. Drink plenty of fluids but you should avoid alcoholic beverages for 24 hours. If you had a esophageal dilation, please see attached instructions for diet.    ACTIVITY: Your care partner should take you home directly after the procedure. You should plan to take it easy, moving slowly for the rest of the day. You can resume normal activity the day after the procedure however YOU SHOULD NOT DRIVE, use power tools, machinery or perform tasks that involve climbing or major physical exertion for 24 hours (because of the sedation medicines used during the test).   SYMPTOMS TO REPORT IMMEDIATELY: A gastroenterologist can be reached at any hour. Please call [phone]  for any of the following symptoms:   Following upper endoscopy (EGD, EUS, ERCP, esophageal dilation) Vomiting of blood or coffee ground material  New, significant abdominal pain  New, significant chest pain or pain under the shoulder blades  Painful or  persistently difficult swallowing  New shortness of breath  Black, tarry-looking or red, bloody stools  FOLLOW UP:  If any biopsies were taken you will be contacted by phone or by letter within the next 1-3 weeks. Call [phone]  if you have not heard about the biopsies in 3 weeks.  Please also call with any specific questions about appointments or follow up tests.  

## 2020-02-23 NOTE — Op Note (Addendum)
[facility] Patient Name: Warren [name] Procedure Date: 02/23/2020 MRN: [id number] Attending MD: [name] , MD Date of Birth: 04/28/1922 CSN: [id number] Age: 86 Admit Type: Outpatient Procedure:                Upper GI endoscopy Indications:              Dysphagia; h/o presbyesopahgus, large food impaction Providers:                [name], MD, Grace Isaac, RN, Doristine Johns, RN, Lazaro Arms, Technician Referring MD:              Medicines:                Monitored Anesthesia Care Complications:            No immediate complications. Estimated blood loss:                            None. Estimated Blood Loss:     Estimated blood loss: none. Procedure:                Pre-Anesthesia Assessment:                           - Prior to the procedure, a History and Physical                            was performed, and patient medications and                            allergies were reviewed. The patient's tolerance of                            previous anesthesia was also reviewed. The risks                            and benefits of the procedure and the sedation                            options and risks were discussed with the patient.                            All questions were answered, and informed consent                            was obtained. Prior Anticoagulants: The patient has                            taken Xarelto (rivaroxaban), last dose was 2 days                            prior to procedure. ASA Grade Assessment: III - A  patient with severe systemic disease. After                            reviewing the risks and benefits, the patient was                            deemed in satisfactory condition to undergo the                            procedure.                           After obtaining informed consent, the endoscope was                            passed under  direct vision. Throughout the                            procedure, the patient's blood pressure, pulse, and                            oxygen saturations were monitored continuously. The                            [id number]-[id number]) Olympus gastroscope was                            introduced through the mouth, and advanced to the                            second part of duodenum. The upper GI endoscopy was                            accomplished without difficulty. The patient                            tolerated the procedure well. Scope In: Scope Out: Findings:      LA Grade C (one or more mucosal breaks continuous between tops of 2 or       more mucosal folds, less than 75% circumference) esophagitis was found       in the distal esophagus.      Very abnormal gastric anatomy which I believe is due to a large hiatal       hernia or a chronic gastric volvulous. This is causing tortuousity,       foreshortening of the esophagus with distal smooth narrowing. I did not       appreciate Barrett's change.      The exam was otherwise without abnormality. Impression:               - LA Grade C (one or more mucosal breaks continuous                            between tops of 2 or more mucosal folds, less than  75% circumference) esophagitis was found in the                            distal esophagus.                           - I reviewed his previous CT scans and Barium                            Esophagrams with a radiologist after the case: he                            has very high diaphragm bilaterally. He does not                            have a hiatal hernia or gastric volvulous however                            the very high diaphgram causes a 180 degree                            "u-turn" at his distal esophagus leading UPWARD                            into his proximal stomach. This is causing                            significant anatomic  distortion of the distal                            esophagus, stomach and is clearly the cause of his                            chronic dysphgia, dilated esophagus. There are no                            endoscopic treatments for this and I do not know if                            there are any surgical options. I discussed                            surgical referral to his daughter, she sill ask                            other family about surgical referral and knows that                            for now he should not advance diet past puree. Moderate Sedation:      Not Applicable - Patient had care per Anesthesia. Recommendation:           - Patient has a contact number available for  emergencies. The signs and symptoms of potential                            delayed complications were discussed with the                            patient. Return to normal activities tomorrow.                            Written discharge instructions were provided to the                            patient.                           - Resume previous diet.                           - Continue present medications. I reiterated need                            for PPI to be twice daily. Also he will start                            pepcid 20mg  pills one pill at bedtime nightly. Procedure Code(s):        --- Professional ---                           [phone], Esophagogastroduodenoscopy, flexible,                            transoral; diagnostic, including collection of                            specimen(s) by brushing or washing, when performed                            (separate procedure) Diagnosis Code(s):        --- Professional ---                           K20.90, Esophagitis, unspecified without bleeding                           R13.10, Dysphagia, unspecified CPT copyright 2019 American Medical Association. All rights reserved. The codes documented in this report  are preliminary and upon coder review may  be revised to meet current compliance requirements. [name], MD 02/23/2020 10:08:15 AM This report has been signed electronically. Number of Addenda: 0

## 2020-02-23 NOTE — Telephone Encounter (Signed)
[name], MD  Timothy Lasso, RN  Forget the barium esophagram, he doesn't need it.   Thanks  Noted

## 2020-02-23 NOTE — H&P (Signed)
HPI: This is a man with recent esophageal impaction  Chief complaint is esophagus stricture, previous food impaction  ROS: complete GI ROS as described in HPI, all other review negative.  Constitutional:  No unintentional weight loss   Past Medical History:  Diagnosis Date  . CAD (coronary artery disease)    moderate by catheterization,11/08...essentially unchanged by repeat study,10/11  . Carotid artery disease (HCC)     less than 50% stenosis bilaterally March 2013   . Claudication ([place])     ABIs within ABIs within normal limits, 40% left iliac disease by catheterization   . Colon polyps   . COVID-19 10/2019  . Depression   . Dizzy   . Edema extremities     secondary to severe varicose veins. Nonpitting edema and dermatitis.   . Emphysema   . Glaucoma   . Hyperlipidemia   . IBS (irritable bowel syndrome)   . Mitral regurgitation    mild  . Mixed dyslipidemia   . Peripheral edema   . Pneumonia   . Prostate cancer [facility])    s/p radiation treatment/s/p TURP  . Shortness of breath    Emphysema  . Thyroid nodule     posterior and inferior to the right lobepossibly outside of the gland secondary to parathyroid adenoma in addition multiple bilateral thyroid nodules     Past Surgical History:  Procedure Laterality Date  . BIOPSY  03/24/2018   Procedure: BIOPSY;  Surgeon: Jackquline Denmark, MD;  Location: [facility] ENDOSCOPY;  Service: Endoscopy;;  . CARDIAC CATHETERIZATION  2008  . CHOLECYSTECTOMY    . CYSTOSCOPY N/A 02/27/2018   Procedure: CYSTOSCOPY;  Surgeon: Bjorn Loser, MD;  Location: [facility] ORS;  Service: Urology;  Laterality: N/A;  . ESOPHAGOGASTRODUODENOSCOPY Left 11/28/2019   Procedure: ESOPHAGOGASTRODUODENOSCOPY (EGD);  Surgeon: Thornton Park, MD;  Location: [place];  Service: Gastroenterology;  Laterality: Left;  . ESOPHAGOGASTRODUODENOSCOPY (EGD) WITH PROPOFOL N/A 03/24/2018   Procedure: ESOPHAGOGASTRODUODENOSCOPY (EGD) WITH PROPOFOL;  Surgeon: Jackquline Denmark,  MD;  Location: [facility] ENDOSCOPY;  Service: Endoscopy;  Laterality: N/A;  . ESOPHAGOGASTRODUODENOSCOPY (EGD) WITH PROPOFOL N/A 11/26/2019   Procedure: ESOPHAGOGASTRODUODENOSCOPY (EGD) WITH PROPOFOL;  Surgeon: Juanita Craver, MD;  Location: [facility] ENDOSCOPY;  Service: Endoscopy;  Laterality: N/A;  . FOREIGN BODY REMOVAL  11/26/2019   Procedure: FOREIGN BODY REMOVAL;  Surgeon: Juanita Craver, MD;  Location: [facility] ENDOSCOPY;  Service: Endoscopy;;  food bolus  . INCONTINENCE SURGERY     ring inserted  . MALONEY DILATION  03/24/2018   Procedure: Venia Minks DILATION;  Surgeon: Jackquline Denmark, MD;  Location: [facility] ENDOSCOPY;  Service: Endoscopy;;  . TONSILLECTOMY    . TRANSURETHRAL RESECTION OF PROSTATE     has stress urinary incontinence  . URINARY SPHINCTER IMPLANT N/A 02/27/2018   Procedure: REMOVAL INFECTED ARTIFICIAL URINARY SPHINCTER;  Surgeon: Bjorn Loser, MD;  Location: [facility] ORS;  Service: Urology;  Laterality: N/A;    Current Facility-Administered Medications  Medication Dose Route Frequency Provider Last Rate Last Admin  . 0.9 %  sodium chloride infusion   Intravenous Continuous [name], MD      . lactated ringers infusion   Intravenous Continuous [name], MD 10 mL/hr at 02/23/20 0917 1,000 mL at 02/23/20 [id number]    Allergies as of 01/31/2020 - Review Complete 12/29/2019  Allergen Reaction Noted  . Aspirin Other (See Comments)   . Cephalexin Other (See Comments) 08/19/2018  . Latex Other (See Comments) 02/26/2018  . Penicillins  02/26/2018    Family History  Problem Relation Age of  Onset  . Heart attack Sister        in her 39's  . Other Mother        lived to be 83  . Pancreatic cancer Father   . Crohn's disease Son   . Colon cancer Neg Hx   . Esophageal cancer Neg Hx   . Stomach cancer Neg Hx   . Liver disease Neg Hx     Social History   Socioeconomic History  . Marital status: Widowed    Spouse name: Not on file  . Number of children: 4  . Years of education: Not on  file  . Highest education level: Not on file  Occupational History  . Occupation: RETIRED    Comment: Engineer, manufacturing systems at [employer]  . Smoking status: Former Smoker    Packs/day: 1.00    Years: 40.00    Pack years: 40.00    Types: Cigarettes    Quit date: 11/04/1983    Years since quitting: 36.3  . Smokeless tobacco: Never Used  Substance and Sexual Activity  . Alcohol use: No  . Drug use: No  . Sexual activity: Not on file  Other Topics Concern  . 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 Difficulty of Paying Living Expenses:   Food Insecurity:   . Worried About Charity fundraiser in the Last Year:   . Arboriculturist in the Last Year:   Transportation Needs:   . Film/video editor (Medical):   Marland Kitchen Lack of Transportation (Non-Medical):   Physical Activity:   . Days of Exercise per Week:   . Minutes of Exercise per Session:   Stress:   . Feeling of Stress :   Social Connections:   . Frequency of Communication with Friends and Family:   . Frequency of Social Gatherings with Friends and Family:   . Attends Religious Services:   . Active Member of Clubs or Organizations:   . Attends Archivist Meetings:   Marland Kitchen Marital Status:   Intimate Partner Violence:   . Fear of Current or Ex-Partner:   . Emotionally Abused:   Marland Kitchen Physically Abused:   . Sexually Abused:      Physical Exam: BP 128/67   Pulse 73   Temp 98.5 F (36.9 C) (Oral)   Resp 17   Ht 5\' 9"  (1.753 m)   Wt 72.6 kg   SpO2 100%   BMI 23.63 kg/m  Constitutional: generally well-appearing Psychiatric: alert and oriented x3 Abdomen: soft, nontender, nondistended, no obvious ascites, no peritoneal signs, normal bowel sounds No peripheral edema noted in lower extremities  Assessment and plan: 86 y.o. male with history of  esophageal stricture, food impaction  For EGD today  Please see the "Patient Instructions" section for addition details about the plan.  Owens Loffler, MD [facility] 02/23/2020, 9:18 AM

## 2020-02-23 NOTE — Telephone Encounter (Signed)
-----   Message from [name], MD sent at 02/23/2020 10:09 AM EDT ----- Warren [name], He needs a barium esophagram for dysphagia.  My question is "does he have a chronic gastric volvulous or a large hiatal hernia".  His gastric anatomy is very distorted.   Thanks

## 2020-02-23 NOTE — Transfer of Care (Signed)
Immediate Anesthesia Transfer of Care Note  Patient: Warren [name]  Procedure(s) Performed: Procedure(s) with comments: ESOPHAGOGASTRODUODENOSCOPY (EGD) WITH PROPOFOL (N/A) - DIL   Patient Location: PACU  Anesthesia Type:MAC  Level of Consciousness:  sedated, patient cooperative and responds to stimulation  Airway & Oxygen Therapy:Patient Spontanous Breathing and Patient connected to face mask oxgen  Post-op Assessment:  Report given to PACU RN and Post -op Vital signs reviewed and stable  Post vital signs:  Reviewed and stable  Last Vitals:  Vitals:   02/23/20 0812  BP: 128/67  Pulse: 73  Resp: 17  Temp: 36.9 C  SpO2: 195%    Complications: No apparent anesthesia complications

## 2020-02-24 ENCOUNTER — Encounter: Payer: Self-pay | Admitting: *Deleted

## 2020-02-27 NOTE — Anesthesia Postprocedure Evaluation (Signed)
Anesthesia Post Note  Patient: Warren [name]  Procedure(s) Performed: ESOPHAGOGASTRODUODENOSCOPY (EGD) WITH PROPOFOL (N/A )     Patient location during evaluation: Endoscopy Anesthesia Type: MAC Level of consciousness: awake and alert Pain management: pain level controlled Vital Signs Assessment: post-procedure vital signs reviewed and stable Respiratory status: spontaneous breathing, nonlabored ventilation, respiratory function stable and patient connected to nasal cannula oxygen Cardiovascular status: stable and blood pressure returned to baseline Postop Assessment: no apparent nausea or vomiting Anesthetic complications: no    Last Vitals:  Vitals:   02/23/20 1010 02/23/20 1020  BP: 111/81 111/69  Pulse: 74 73  Resp: 17 (!) 23  Temp:    SpO2: 98% 98%    Last Pain:  Vitals:   02/23/20 1020  TempSrc:   PainSc: 0-No pain                 Madellyn Denio

## 2020-10-17 ENCOUNTER — Emergency Department (HOSPITAL_COMMUNITY): Payer: Medicare Other

## 2020-10-17 ENCOUNTER — Observation Stay (HOSPITAL_COMMUNITY)
Admission: EM | Admit: 2020-10-17 | Discharge: 2020-10-18 | Disposition: A | Discharge status: Home / Self Care | Payer: Medicare Other | Attending: Internal Medicine | Admitting: Internal Medicine

## 2020-10-17 DIAGNOSIS — I739 Peripheral vascular disease, unspecified: Secondary | ICD-10-CM | POA: Diagnosis present

## 2020-10-17 DIAGNOSIS — Z79899 Other long term (current) drug therapy: Secondary | ICD-10-CM | POA: Diagnosis not present

## 2020-10-17 DIAGNOSIS — F0390 Unspecified dementia without behavioral disturbance: Secondary | ICD-10-CM | POA: Diagnosis not present

## 2020-10-17 DIAGNOSIS — Z20822 Contact with and (suspected) exposure to covid-19: Secondary | ICD-10-CM | POA: Insufficient documentation

## 2020-10-17 DIAGNOSIS — R000 Tachycardia, unspecified: Secondary | ICD-10-CM | POA: Diagnosis not present

## 2020-10-17 DIAGNOSIS — Z8616 Personal history of COVID-19: Secondary | ICD-10-CM | POA: Insufficient documentation

## 2020-10-17 DIAGNOSIS — I2510 Atherosclerotic heart disease of native coronary artery without angina pectoris: Secondary | ICD-10-CM | POA: Diagnosis not present

## 2020-10-17 DIAGNOSIS — Z91040 Latex allergy status: Secondary | ICD-10-CM | POA: Diagnosis not present

## 2020-10-17 DIAGNOSIS — Z7901 Long term (current) use of anticoagulants: Secondary | ICD-10-CM | POA: Diagnosis not present

## 2020-10-17 DIAGNOSIS — G9340 Encephalopathy, unspecified: Secondary | ICD-10-CM | POA: Diagnosis not present

## 2020-10-17 DIAGNOSIS — I10 Essential (primary) hypertension: Secondary | ICD-10-CM | POA: Insufficient documentation

## 2020-10-17 DIAGNOSIS — R4182 Altered mental status, unspecified: Secondary | ICD-10-CM | POA: Diagnosis present

## 2020-10-17 DIAGNOSIS — R400 Somnolence: Secondary | ICD-10-CM | POA: Insufficient documentation

## 2020-10-17 DIAGNOSIS — F32A Depression, unspecified: Secondary | ICD-10-CM | POA: Diagnosis present

## 2020-10-17 DIAGNOSIS — G459 Transient cerebral ischemic attack, unspecified: Secondary | ICD-10-CM

## 2020-10-17 DIAGNOSIS — K224 Dyskinesia of esophagus: Secondary | ICD-10-CM | POA: Diagnosis present

## 2020-10-17 DIAGNOSIS — Z87891 Personal history of nicotine dependence: Secondary | ICD-10-CM | POA: Insufficient documentation

## 2020-10-17 DIAGNOSIS — E785 Hyperlipidemia, unspecified: Secondary | ICD-10-CM | POA: Diagnosis present

## 2020-10-17 LAB — URINALYSIS, COMPLETE (UACMP) WITH MICROSCOPIC
Bacteria, UA: NONE SEEN
Bilirubin Urine: NEGATIVE
Glucose, UA: NEGATIVE mg/dL
Hgb urine dipstick: NEGATIVE
Ketones, ur: 20 mg/dL — AB
Leukocytes,Ua: NEGATIVE
Nitrite: NEGATIVE
Protein, ur: NEGATIVE mg/dL
Specific Gravity, Urine: 1.02 (ref 1.005–1.030)
pH: 5 (ref 5.0–8.0)

## 2020-10-17 LAB — CBC WITH DIFFERENTIAL/PLATELET
Abs Immature Granulocytes: 0.05 10*3/uL (ref 0.00–0.07)
Basophils Absolute: 0 10*3/uL (ref 0.0–0.1)
Basophils Relative: 0 %
Eosinophils Absolute: 0.1 10*3/uL (ref 0.0–0.5)
Eosinophils Relative: 1 %
HCT: 46 % (ref 39.0–52.0)
Hemoglobin: 15 g/dL (ref 13.0–17.0)
Immature Granulocytes: 1 %
Lymphocytes Relative: 6 %
Lymphs Abs: 0.6 10*3/uL — ABNORMAL LOW (ref 0.7–4.0)
MCH: 31.3 pg (ref 26.0–34.0)
MCHC: 32.6 g/dL (ref 30.0–36.0)
MCV: 95.8 fL (ref 80.0–100.0)
Monocytes Absolute: 1.1 10*3/uL — ABNORMAL HIGH (ref 0.1–1.0)
Monocytes Relative: 11 %
Neutro Abs: 8.2 10*3/uL — ABNORMAL HIGH (ref 1.7–7.7)
Neutrophils Relative %: 81 %
Platelets: 169 10*3/uL (ref 150–400)
RBC: 4.8 MIL/uL (ref 4.22–5.81)
RDW: 13.6 % (ref 11.5–15.5)
WBC: 10 10*3/uL (ref 4.0–10.5)
nRBC: 0 % (ref 0.0–0.2)

## 2020-10-17 LAB — COMPREHENSIVE METABOLIC PANEL
ALT: 9 U/L (ref 0–44)
AST: 15 U/L (ref 15–41)
Albumin: 3 g/dL — ABNORMAL LOW (ref 3.5–5.0)
Alkaline Phosphatase: 31 U/L — ABNORMAL LOW (ref 38–126)
Anion gap: 10 (ref 5–15)
BUN: 13 mg/dL (ref 8–23)
CO2: 28 mmol/L (ref 22–32)
Calcium: 9.5 mg/dL (ref 8.9–10.3)
Chloride: 96 mmol/L — ABNORMAL LOW (ref 98–111)
Creatinine, Ser: 0.91 mg/dL (ref 0.61–1.24)
GFR, Estimated: >60 mL/min (ref >60)
Glucose, Bld: 125 mg/dL — ABNORMAL HIGH (ref 70–99)
Potassium: 4.6 mmol/L (ref 3.5–5.1)
Sodium: 134 mmol/L — ABNORMAL LOW (ref 135–145)
Total Bilirubin: 0.9 mg/dL (ref 0.3–1.2)
Total Protein: 6 g/dL — ABNORMAL LOW (ref 6.5–8.1)

## 2020-10-17 LAB — RESP PANEL BY RT-PCR (FLU A&B, COVID) ARPGX2
Influenza A by PCR: NEGATIVE
Influenza B by PCR: NEGATIVE
SARS Coronavirus 2 by RT PCR: NEGATIVE

## 2020-10-17 LAB — APTT: aPTT: 40 seconds — ABNORMAL HIGH (ref 24–36)

## 2020-10-17 LAB — PROTIME-INR
INR: 1.3 — ABNORMAL HIGH (ref 0.8–1.2)
Prothrombin Time: 15.7 seconds — ABNORMAL HIGH (ref 11.4–15.2)

## 2020-10-17 LAB — CBG MONITORING, ED: Glucose-Capillary: 110 mg/dL — ABNORMAL HIGH (ref 70–99)

## 2020-10-17 LAB — VALPROIC ACID LEVEL: Valproic Acid Lvl: 24 ug/mL — ABNORMAL LOW (ref 50.0–100.0)

## 2020-10-17 MED ORDER — PANTOPRAZOLE SODIUM 40 MG PO TBEC
40.0000 mg | DELAYED_RELEASE_TABLET | Freq: Two times a day (BID) | ORAL | Status: DC
  Administered 2020-10-18: 10:00:00 40 mg via ORAL
  Filled 2020-10-17: qty 1

## 2020-10-17 MED ORDER — POLYETHYLENE GLYCOL 3350 17 G PO PACK: 17.0000 g | PACK | Freq: Every day | ORAL | Status: DC | PRN

## 2020-10-17 MED ORDER — LORAZEPAM 2 MG/ML IJ SOLN
INTRAMUSCULAR | Status: AC
  Filled 2020-10-17: qty 1

## 2020-10-17 MED ORDER — DOCUSATE SODIUM 100 MG PO CAPS
100.0000 mg | ORAL_CAPSULE | Freq: Every day | ORAL | Status: DC
  Filled 2020-10-17: qty 1

## 2020-10-17 MED ORDER — LACTATED RINGERS IV BOLUS
500.0000 mL | Freq: Once | INTRAVENOUS | Status: AC
  Administered 2020-10-17: 500 mL via INTRAVENOUS

## 2020-10-17 MED ORDER — TAMSULOSIN HCL 0.4 MG PO CAPS
0.4000 mg | ORAL_CAPSULE | Freq: Every day | ORAL | Status: DC
  Administered 2020-10-18: 10:00:00 0.4 mg via ORAL
  Filled 2020-10-17: qty 1

## 2020-10-17 MED ORDER — PRAVASTATIN SODIUM 10 MG PO TABS: 20.0000 mg | ORAL_TABLET | Freq: Every evening | ORAL | Status: DC

## 2020-10-17 MED ORDER — ALBUTEROL SULFATE (2.5 MG/3ML) 0.083% IN NEBU: 2.5000 mg | INHALATION_SOLUTION | RESPIRATORY_TRACT | Status: DC | PRN

## 2020-10-17 MED ORDER — SODIUM CHLORIDE 0.9 % IV SOLN: INTRAVENOUS | Status: AC

## 2020-10-17 MED ORDER — ACETAMINOPHEN 650 MG RE SUPP: 650.0000 mg | Freq: Four times a day (QID) | RECTAL | Status: DC | PRN

## 2020-10-17 MED ORDER — ACETAMINOPHEN 325 MG PO TABS: 650.0000 mg | ORAL_TABLET | Freq: Four times a day (QID) | ORAL | Status: DC | PRN

## 2020-10-17 MED ORDER — IPRATROPIUM-ALBUTEROL 0.5-2.5 (3) MG/3ML IN SOLN
3.0000 mL | Freq: Four times a day (QID) | RESPIRATORY_TRACT | Status: DC
  Administered 2020-10-18: 10:00:00 3 mL via RESPIRATORY_TRACT
  Filled 2020-10-17: qty 3

## 2020-10-17 MED ORDER — RIVAROXABAN 20 MG PO TABS: 20.0000 mg | ORAL_TABLET | Freq: Every evening | ORAL | Status: DC

## 2020-10-17 MED ORDER — CLINDAMYCIN PHOSPHATE 600 MG/50ML IV SOLN
600.0000 mg | Freq: Once | INTRAVENOUS | Status: AC
  Administered 2020-10-17: 22:00:00 600 mg via INTRAVENOUS
  Filled 2020-10-17: qty 50

## 2020-10-17 MED ORDER — LORAZEPAM 2 MG/ML IJ SOLN
1.0000 mg | Freq: Once | INTRAMUSCULAR | Status: AC
  Administered 2020-10-17: 20:00:00 1 mg via INTRAVENOUS

## 2020-10-17 MED ORDER — NEOMYCIN-POLYMYXIN-HC 3.5-10000-1 OT SUSP
4.0000 [drp] | Freq: Two times a day (BID) | OTIC | Status: DC
  Administered 2020-10-18: 10:00:00 4 [drp] via OTIC
  Filled 2020-10-17 (×2): qty 10

## 2020-10-17 MED ORDER — MIRABEGRON ER 25 MG PO TB24
25.0000 mg | ORAL_TABLET | Freq: Every day | ORAL | Status: DC
  Administered 2020-10-18: 10:00:00 25 mg via ORAL
  Filled 2020-10-17: qty 1

## 2020-10-17 MED ORDER — SODIUM CHLORIDE 0.9% FLUSH
3.0000 mL | Freq: Two times a day (BID) | INTRAVENOUS | Status: DC
  Administered 2020-10-17 – 2020-10-18 (×2): 3 mL via INTRAVENOUS

## 2020-10-17 NOTE — ED Notes (Signed)
Pt is still in MRI. RN unable to validate vital signs

## 2020-10-17 NOTE — ED Notes (Signed)
RN went to MRI to give pt the other 0.5mg  of ativan

## 2020-10-17 NOTE — ED Notes (Signed)
Pt is in MRI. Unable to validate vitals signs.

## 2020-10-17 NOTE — ED Triage Notes (Signed)
Pt was brought by EMS. Nursing home called EMS for AMS and increased confusion. Pt has gauze in his right ear due to ear infection per Nursing home   BP-130/78 HR- 78 O2- 96 CBG- 158 T- 97.3

## 2020-10-17 NOTE — H&P (Signed)
History and Physical   Warren [name] LKG:[id number] DOB: [birth year] DOA: 10/17/2020  PCP: Pcp, No   Patient coming from: Nursing home  Chief Complaint: Altered mental status  HPI: Warren [name] is a 86 y.o. male with medical history significant of anxiety/depression, CAD, carotid artery disease, peripheral arterial disease, dementia, DNR, emphysema, esophageal dysfunction, hypertension,, history of prostate cancer who presents from his nursing home with worsening mental status.  Due to patient's encephalopathy history was obtained with the assistance of family and chart review.  According to family members patient has had some decline mental status for the past week.  At baseline he is demented but is typically awake alert and interactive. For the past week he has reportedly been not himself, apparently speaking with [place].  Today nursing home staff became concerned that he may be having a stroke as he was not interacting and was having difficulty holding a spoon.  Family does note that he is no longer ambulatory and gets around in a wheelchair only.  ED Course: In the ED vitals significant for hypertension in the 664Q to 034V systolic at times.  BMP notable for mild hyponatremia 134, glucose 125.  LFTs showed protein of 6 and albumin of 3.  CBC within normal limits.  PT PTT and INR chronically mildly elevated at 15.7, 40, 1.3 respectively.  Valproic acid level 24, respiratory panel for flu and Covid negative.  Urine analysis and urine culture pending.  Imaging showed hypoinflation on chest x-ray, right otitis externa noted on CT head but there were no acute or cranial abnormalities, MR brain showed no acute abnormalities.  Patient given IV Clinda for his otitis externa.  Review of Systems: Unable to be performed due to patient's encephalopathy.  Other than being somewhat off his baseline as above no reported complaints in the week prior to admission.  Past Medical History:  Diagnosis  Date  . CAD (coronary artery disease)    moderate by catheterization,11/08...essentially unchanged by repeat study,10/11  . Carotid artery disease (HCC)     less than 50% stenosis bilaterally March 2013   . Claudication ([place])     ABIs within ABIs within normal limits, 40% left iliac disease by catheterization   . Colon polyps   . COVID-19 10/2019  . Depression   . Dizzy   . Edema extremities     secondary to severe varicose veins. Nonpitting edema and dermatitis.   . Emphysema   . Glaucoma   . Hyperlipidemia   . IBS (irritable bowel syndrome)   . Mitral regurgitation    mild  . Mixed dyslipidemia   . Peripheral edema   . Pneumonia   . Prostate cancer [facility])    s/p radiation treatment/s/p TURP  . Shortness of breath    Emphysema  . Thyroid nodule     posterior and inferior to the right lobepossibly outside of the gland secondary to parathyroid adenoma in addition multiple bilateral thyroid nodules     Past Surgical History:  Procedure Laterality Date  . BIOPSY  03/24/2018   Procedure: BIOPSY;  Surgeon: Jackquline Denmark, MD;  Location: [facility];  Service: Endoscopy;;  . CARDIAC CATHETERIZATION  2008  . CHOLECYSTECTOMY    . CYSTOSCOPY N/A 02/27/2018   Procedure: CYSTOSCOPY;  Surgeon: Bjorn Loser, MD;  Location: [facility];  Service: Urology;  Laterality: N/A;  . ESOPHAGOGASTRODUODENOSCOPY Left 11/28/2019   Procedure: ESOPHAGOGASTRODUODENOSCOPY (EGD);  Surgeon: Thornton Park, MD;  Location: [place];  Service: Gastroenterology;  Laterality: Left;  .  ESOPHAGOGASTRODUODENOSCOPY (EGD) WITH PROPOFOL N/A 03/24/2018   Procedure: ESOPHAGOGASTRODUODENOSCOPY (EGD) WITH PROPOFOL;  Surgeon: Jackquline Denmark, MD;  Location: [facility];  Service: Endoscopy;  Laterality: N/A;  . ESOPHAGOGASTRODUODENOSCOPY (EGD) WITH PROPOFOL N/A 11/26/2019   Procedure: ESOPHAGOGASTRODUODENOSCOPY (EGD) WITH PROPOFOL;  Surgeon: Juanita Craver, MD;  Location: [facility] ENDOSCOPY;  Service: Endoscopy;  Laterality:  N/A;  . ESOPHAGOGASTRODUODENOSCOPY (EGD) WITH PROPOFOL N/A 02/23/2020   Procedure: ESOPHAGOGASTRODUODENOSCOPY (EGD) WITH PROPOFOL;  Surgeon: [name], MD;  Location: [facility];  Service: Endoscopy;  Laterality: N/A;  DIL   . FOREIGN BODY REMOVAL  11/26/2019   Procedure: FOREIGN BODY REMOVAL;  Surgeon: Juanita Craver, MD;  Location: [facility] ENDOSCOPY;  Service: Endoscopy;;  food bolus  . INCONTINENCE SURGERY     ring inserted  . MALONEY DILATION  03/24/2018   Procedure: Venia Minks DILATION;  Surgeon: Jackquline Denmark, MD;  Location: [facility];  Service: Endoscopy;;  . TONSILLECTOMY    . TRANSURETHRAL RESECTION OF PROSTATE     has stress urinary incontinence  . URINARY SPHINCTER IMPLANT N/A 02/27/2018   Procedure: REMOVAL INFECTED ARTIFICIAL URINARY SPHINCTER;  Surgeon: Bjorn Loser, MD;  Location: [facility];  Service: Urology;  Laterality: N/A;    Social History  reports that he quit smoking about 36 years ago. His smoking use included cigarettes. He has a 40.00 pack-year smoking history. He has never used smokeless tobacco. He reports that he does not drink alcohol and does not use drugs.  Allergies  Allergen Reactions  . Aspirin Other (See Comments)    Reaction - spots  . Cephalexin Other (See Comments)    Reported by [facility] 08/19/2018  . Latex Other (See Comments)    Unknown reaction  . Penicillins Other (See Comments)    Per MAR Has patient had a PCN reaction causing immediate rash, facial/tongue/throat swelling, SOB or lightheadedness with hypotension:No Has patient had a PCN reaction causing severe rash involving mucus membranes or skin necrosis: No Has patient had a PCN reaction that required hospitalization:NoHas patient had a PCN reaction occurring within the last 10 years:No If all of the above answers are "NO", then may proceed with Cephalosporin use.     Family History  Problem Relation Age of Onset  . Heart attack Sister        in her 49's  . Other  Mother        lived to be 53  . Pancreatic cancer Father   . Crohn's disease Son   . Colon cancer Neg Hx   . Esophageal cancer Neg Hx   . Stomach cancer Neg Hx   . Liver disease Neg Hx   Reviewed on admission  Prior to Admission medications   Medication Sig Start Date End Date Taking? Authorizing Provider  acetaminophen (TYLENOL) 500 MG tablet Take 500 mg by mouth every 8 (eight) hours as needed for mild pain.    Yes [provider]  ascorbic acid (VITAMIN C) 500 MG tablet Take 500 mg by mouth daily.   Yes [provider]  calcium carbonate (OS-CAL - DOSED IN MG OF ELEMENTAL CALCIUM) 1250 (500 Ca) MG tablet Take 1,250 mg by mouth 2 (two) times daily.    Yes [provider]  Cholecalciferol 50 MCG (2000 UT) TABS Take 2,000 Units by mouth daily.    Yes [provider]  citalopram (CELEXA) 20 MG tablet Take 20 mg by mouth daily.   Yes [provider]  CORTISPORIN-TC 3.01-03-09-0.5 MG/ML OTIC suspension Place 4 drops into the  right ear in the morning and at bedtime. 10/16/20  Yes [provider]  divalproex (DEPAKOTE SPRINKLE) 125 MG capsule Take 2 capsules by mouth in the morning and at bedtime. 09/21/20  Yes [provider]  docusate sodium (COLACE) 100 MG capsule Take 100 mg by mouth daily.   Yes [provider]  ferrous sulfate 325 (65 FE) MG EC tablet Take 325 mg by mouth daily.   Yes [provider]  fluticasone (FLONASE) 50 MCG/ACT nasal spray Place 1 spray into both nostrils 2 (two) times daily. 10/28/19  Yes Arrien, Jimmy Picket, MD  LORazepam (ATIVAN) 0.5 MG tablet Take 0.25 mg by mouth at bedtime.   Yes [provider]  MELATONIN PO Take 7 mg by mouth at bedtime.   Yes [provider]  mirabegron ER (MYRBETRIQ) 25 MG TB24 tablet Take 25 mg by mouth daily.   Yes [provider]  pantoprazole (PROTONIX) 40 MG tablet Take 40 mg by mouth 2 (two) times daily.   Yes [provider]  potassium chloride SA (KLOR-CON) 20 MEQ tablet Take 20 mEq by mouth daily.   Yes [provider]  pravastatin (PRAVACHOL) 20 MG tablet Take 20 mg by mouth every evening.   Yes [provider]  rivaroxaban (XARELTO) 20 MG TABS tablet Take 20 mg by mouth every evening.   Yes [provider]  tamsulosin (FLOMAX) 0.4 MG CAPS capsule Take 0.4 mg by mouth daily.   Yes [provider]  guaiFENesin-dextromethorphan (ROBITUSSIN DM) 100-10 MG/5ML syrup Take 5 mLs by mouth every 4 (four) hours as needed for cough. Patient not taking: Reported on 10/17/2020 10/28/19   Arrien, Jimmy Picket, MD  pantoprazole (PROTONIX) 40 MG tablet Take 1 tablet (40 mg total) by mouth 2 (two) times daily. Patient not taking: Reported on 10/17/2020 11/29/19 03/28/20  Earlene Plater, MD  atorvastatin (LIPITOR) 40 MG tablet Take 40 mg by mouth at bedtime.    01/27/12  [provider]  furosemide (LASIX) 20 MG tablet Take 1 tablet (20 mg total) by mouth daily. 07/30/11 01/27/12  de Stanford Scotland, MD    Physical Exam: Vitals:   10/17/20 1845 10/17/20 1900 10/17/20 1915 10/17/20 2208  BP: 139/81 119/83 (!) 142/72 (!) 144/85  Pulse: 81 78 80 95  Resp: (!) 21 19 (!) 21 15  Temp:      TempSrc:      SpO2: 97% 98% 98% 98%    Physical Exam Constitutional:      General: He is not in acute distress.    Appearance: Normal appearance.  HENT:     Head: Normocephalic and atraumatic.     Mouth/Throat:     Mouth: Mucous membranes are moist.     Pharynx: Oropharynx is clear.  Eyes:     Extraocular Movements: Extraocular movements intact.     Pupils: Pupils are equal, round, and reactive to light.  Cardiovascular:     Rate and Rhythm: Regular rhythm. Tachycardia present.     Pulses: Normal pulses.     Heart sounds: Normal heart sounds.  Pulmonary:     Effort: Pulmonary effort is normal. No respiratory distress.     Breath sounds: Wheezing present.  Abdominal:      General: Bowel sounds are normal. There is no distension.     Palpations: Abdomen is soft.     Tenderness: There is no abdominal tenderness.  Musculoskeletal:        General: No swelling or deformity.  Skin:    General: Skin is warm and dry.  Neurological:     Comments: Somnolent, will open eyes to verbal and painful stimuli.  Some balance to some questions with simple yes and no.  Disoriented.    Labs on Admission: I have personally reviewed following labs and imaging studies  CBC: Recent Labs  Lab 10/17/20 1556  WBC 10.0  NEUTROABS 8.2*  HGB 15.0  HCT 46.0  MCV 95.8  PLT 381    Basic Metabolic Panel: Recent Labs  Lab 10/17/20 1556  NA 134*  K 4.6  CL 96*  CO2 28  GLUCOSE 125*  BUN 13  CREATININE 0.91  CALCIUM 9.5    GFR: CrCl cannot be calculated (Unknown ideal weight.).  Liver Function Tests: Recent Labs  Lab 10/17/20 1556  AST 15  ALT 9  ALKPHOS 31*  BILITOT 0.9  PROT 6.0*  ALBUMIN 3.0*    Urine analysis:    Component Value Date/Time   COLORURINE YELLOW 10/17/2020 2000   APPEARANCEUR CLEAR 10/17/2020 2000   LABSPEC 1.020 10/17/2020 2000   PHURINE 5.0 10/17/2020 2000   GLUCOSEU NEGATIVE 10/17/2020 2000   HGBUR NEGATIVE 10/17/2020 [place] NEGATIVE 10/17/2020 2000   KETONESUR 20 (A) 10/17/2020 2000   PROTEINUR NEGATIVE 10/17/2020 2000   NITRITE NEGATIVE 10/17/2020 2000   LEUKOCYTESUR NEGATIVE 10/17/2020 2000    Radiological Exams on Admission: CT Head Wo Contrast  Result Date: 10/17/2020 CLINICAL DATA:  Mental status change, unknown cause also, R sided otitis exterma - eval for signs of osteo/mastoiditis. EXAM: CT HEAD WITHOUT CONTRAST TECHNIQUE: Contiguous axial images were obtained from the base of the skull through the vertex without intravenous contrast. COMPARISON:  Report from prior exams at an outside institution, no images available. FINDINGS: Brain: Age related atrophy. No ventriculomegaly is likely due to central atrophy.  Advanced periventricular and deep white matter hypodensity, typical of chronic small vessel ischemia. Remote lacunar infarct in the right basal ganglia. No hemorrhage or evidence of acute infarct. No subdural or extra-axial collection. Vascular: Atherosclerosis of skullbase vasculature without hyperdense vessel or abnormal calcification. Skull: No fracture or focal lesion. Sinuses/Orbits: Minimal soft tissue density in the right external auditory canal. No evidence of subjacent bony destruction or abnormal mastoid air cell opacification. Mastoid air cells are clear. Left mastoid air cells are clear. Mucosal thickening of the anterior right maxillary sinus, scattered ethmoid air cells. Bilateral cataract resection. Other: Soft tissue thickening of the right external auditory canal and pinna. No focal fluid collection. IMPRESSION: 1. Soft tissue thickening of the right external auditory canal and pinna consistent with otitis externa. No evidence of subjacent bony destruction or abnormal mastoid air cell opacification. 2. Age related atrophy and advanced chronic small vessel ischemia. Remote lacunar infarct in the right basal ganglia. No acute intracranial abnormality. Electronically Signed   By: Keith Rake M.D.   On: 10/17/2020 16:21   MR BRAIN WO CONTRAST  Result Date: 10/17/2020 CLINICAL DATA:  Acute neurologic deficit EXAM: MRI HEAD WITHOUT CONTRAST TECHNIQUE: Multiplanar, multiecho pulse sequences of the brain and surrounding structures were obtained without intravenous contrast. COMPARISON:  None. FINDINGS: Brain: No acute infarct, mass effect or extra-axial collection. No acute or chronic hemorrhage. Confluent hyperintense T2-weighted white matter signal. Diffuse, severe atrophy. The midline structures are normal. Vascular: Major flow voids are preserved. Skull and upper cervical spine: Normal calvarium and skull base. Visualized upper cervical spine and soft tissues are normal. Sinuses/Orbits:No  paranasal sinus fluid levels or advanced mucosal  thickening. No mastoid or middle ear effusion. Normal orbits. IMPRESSION: 1. No acute intracranial abnormality. 2. Diffuse, severe atrophy and findings of chronic small vessel disease. Electronically Signed   By: Ulyses Jarred M.D.   On: 10/17/2020 21:42   DG Chest Port 1 View  Result Date: 10/17/2020 CLINICAL DATA:  Altered mental status EXAM: PORTABLE CHEST 1 VIEW COMPARISON:  10/24/2019 FINDINGS: Lungs volumes are extremely small, but are symmetric and are clear. No pneumothorax or pleural effusion. Cardiac size within normal limits. Pulmonary vascularity is normal. Osseous structures are age-appropriate. No acute bone abnormality. IMPRESSION: Pulmonary hypoinflation. Electronically Signed   By: Fidela Salisbury MD   On: 10/17/2020 19:11   EKG: Not yet obtained  Assessment/Plan Principal Problem:   Acute encephalopathy Active Problems:   Dyslipidemia   HYPERTENSION, BENIGN   Claudication (HCC)   CAD (coronary artery disease)   Anxiety and depression   Esophageal dysfunction   Dementia (HCC)  Dementia Acute Encephalopathy > Per family is awake, alert, interactive at baseline and has been declining for the past week. > Etiology unclear at this time: Initial work-up negative for signs of systemic infection, but does have known otitis externa and history of UTIs. > Electrolytes including sodium, potassium, calcium normal or near normal. > Patient is on several centrally acting medications for anxiety and depression including Depakote, lorazepam, citalopram > As above, does have otitis externa, unlikely that this is contributing significantly to his altered no status, possibility of declining overall status and/or polypharmacy does exist as well. > Patient is DNR and per granddaughter, family does have interest in palliative care/hospice care - Redirect as needed - Follow-up urinalysis and urine cultures, history of UTIs - Obtain blood  cultures - Check TSH - Add on magnesium - Trend CMP and CBC - Hold centrally acting medications - Delirium precautions - Continue eardrops for now, hold off on additional antibiotics given minimal ear pain and no other signs of systemic infection - Maintenance IVF - Add on respiratory panel - Consult to palliative care  Anxiety/depression - Holding home Depakote, lorazepam, citalopram  CAD Claudication Carotid artery disease Hyperlipidemia - Continue home Pravastatin  Emphysema - Wheezing on exam - DuoNebs every 6 hours for 1 day - As needed albuterol  Esophageal dysfunction - N.p.o. for now given encephalopathy - May need SLP evaluation  Skin Changes - Raw skin reported at penis - Will need more frequent perineal care   DVT prophylaxis: Xarelto, on this at home per med list, granddaughter unable to confirm will need to check with other family tomorrow, but unable to reach by phone tonight. Code Status:   DNR  Family Communication:  Discussed with granddaughter by phone Disposition Plan:   Patient is from:  Nursing home  Anticipated DC to:  Pending clinical course  Anticipated DC date:  Pending clinical course  Anticipated DC barriers: None  Consults called:  None Admission status:  Observation, telemetry  Severity of Illness: The appropriate patient status for this patient is OBSERVATION. Observation status is judged to be reasonable and necessary in order to provide the required intensity of service to ensure the patient's safety. The patient's presenting symptoms, physical exam findings, and initial radiographic and laboratory data in the context of their medical condition is felt to place them at decreased risk for further clinical deterioration. Furthermore, it is anticipated that the patient will be medically stable for discharge from the hospital within 2 midnights of admission. The following factors support the patient status of observation.   "  The patient's  presenting symptoms include encephalopathy. " The physical exam findings include encephalopathy. " The initial radiographic and laboratory data are stable at this time, no obvious explanation for encephalopathy, is noted to have ongoing right otitis externa.  Marcelyn Bruins MD [facility]  How to contact the [facility] Attending or Consulting provider [place] or covering provider during after hours [place], for this patient?   1. Check the care team in [facility] and look for a) attending/consulting [facility] provider listed and b) the [facility] team listed 2. Log into [email or web address] and use [place]'s universal password to access. If you do not have the password, please contact the hospital operator. 3. Locate the [facility] provider you are looking for under [facility] and page to a number that you can be directly reached. 4. If you still have difficulty reaching the provider, please page the [facility] (Director on Call) for the Hospitalists listed on amion for assistance.  10/17/2020, 10:51 PM

## 2020-10-17 NOTE — ED Notes (Signed)
RN met pt at MRI to give 0.51m of ativan. Pt was agitated.

## 2020-10-17 NOTE — ED Notes (Signed)
Patient transported to MRI 

## 2020-10-17 NOTE — ED Provider Notes (Signed)
Assumed care of patient at signout.  On my initial evaluation patient resting, in no distress, not particularly interactive Head initial, and multiple subsequent conversations with the patient family members including her niece, nephew, granddaughter. Patient has had notable decline in condition in the past week, As noted dementia, is typically awake, alert, interactive, initial findings nonrevealing, with essentially unremarkable labs, head CT.  With consideration of his new altered mental status, patient is awaiting MRI, urinalysis, Covid test.    Carmin Muskrat, MD 10/18/20 1001

## 2020-10-18 ENCOUNTER — Other Ambulatory Visit: Payer: Self-pay

## 2020-10-18 DIAGNOSIS — Z515 Encounter for palliative care: Secondary | ICD-10-CM

## 2020-10-18 DIAGNOSIS — Z7189 Other specified counseling: Secondary | ICD-10-CM

## 2020-10-18 DIAGNOSIS — R400 Somnolence: Secondary | ICD-10-CM

## 2020-10-18 DIAGNOSIS — F0390 Unspecified dementia without behavioral disturbance: Secondary | ICD-10-CM | POA: Diagnosis not present

## 2020-10-18 DIAGNOSIS — G9340 Encephalopathy, unspecified: Secondary | ICD-10-CM | POA: Diagnosis not present

## 2020-10-18 LAB — COMPREHENSIVE METABOLIC PANEL
ALT: 7 U/L (ref 0–44)
AST: 23 U/L (ref 15–41)
Albumin: 2.9 g/dL — ABNORMAL LOW (ref 3.5–5.0)
Alkaline Phosphatase: 32 U/L — ABNORMAL LOW (ref 38–126)
Anion gap: 15 (ref 5–15)
BUN: 14 mg/dL (ref 8–23)
CO2: 24 mmol/L (ref 22–32)
Calcium: 9 mg/dL (ref 8.9–10.3)
Chloride: 94 mmol/L — ABNORMAL LOW (ref 98–111)
Creatinine, Ser: 0.78 mg/dL (ref 0.61–1.24)
GFR, Estimated: >60 mL/min (ref >60)
Glucose, Bld: 114 mg/dL — ABNORMAL HIGH (ref 70–99)
Potassium: 5.1 mmol/L (ref 3.5–5.1)
Sodium: 133 mmol/L — ABNORMAL LOW (ref 135–145)
Total Bilirubin: 1.6 mg/dL — ABNORMAL HIGH (ref 0.3–1.2)
Total Protein: 5.4 g/dL — ABNORMAL LOW (ref 6.5–8.1)

## 2020-10-18 LAB — CBC
HCT: 46.1 % (ref 39.0–52.0)
Hemoglobin: 15.5 g/dL (ref 13.0–17.0)
MCH: 32.1 pg (ref 26.0–34.0)
MCHC: 33.6 g/dL (ref 30.0–36.0)
MCV: 95.4 fL (ref 80.0–100.0)
Platelets: 162 10*3/uL (ref 150–400)
RBC: 4.83 MIL/uL (ref 4.22–5.81)
RDW: 13.6 % (ref 11.5–15.5)
WBC: 17.9 10*3/uL — ABNORMAL HIGH (ref 4.0–10.5)
nRBC: 0 % (ref 0.0–0.2)

## 2020-10-18 LAB — URINE CULTURE

## 2020-10-18 LAB — RAPID URINE DRUG SCREEN, HOSP PERFORMED
Amphetamines: NOT DETECTED
Barbiturates: NOT DETECTED
Benzodiazepines: POSITIVE — AB
Cocaine: NOT DETECTED
Opiates: NOT DETECTED
Tetrahydrocannabinol: NOT DETECTED

## 2020-10-18 LAB — MAGNESIUM: Magnesium: 1.8 mg/dL (ref 1.7–2.4)

## 2020-10-18 LAB — TSH: TSH: 2.667 u[IU]/mL (ref 0.350–4.500)

## 2020-10-18 MED ORDER — LORAZEPAM 1 MG PO TABS: 1.0000 mg | ORAL_TABLET | ORAL | 0 refills | Status: DC | PRN

## 2020-10-18 MED ORDER — MORPHINE SULFATE (CONCENTRATE) 10 MG/0.5ML PO SOLN
5.0000 mg | ORAL | Status: DC | PRN
Start: 2020-10-18 — End: 2020-10-19

## 2020-10-18 MED ORDER — MORPHINE SULFATE (CONCENTRATE) 10 MG/0.5ML PO SOLN: 5.0000 mg | ORAL | 0 refills | Status: AC | PRN

## 2020-10-18 MED ORDER — SCOPOLAMINE 1 MG/3DAYS TD PT72: 1.0000 | MEDICATED_PATCH | TRANSDERMAL | 12 refills | Status: AC

## 2020-10-18 MED ORDER — LORAZEPAM 1 MG PO TABS: 1.0000 mg | ORAL_TABLET | ORAL | 0 refills | Status: AC | PRN

## 2020-10-18 MED ORDER — MORPHINE SULFATE (CONCENTRATE) 10 MG/0.5ML PO SOLN: 5.0000 mg | ORAL | 0 refills | Status: DC | PRN

## 2020-10-18 MED ORDER — SCOPOLAMINE 1 MG/3DAYS TD PT72
1.0000 | MEDICATED_PATCH | TRANSDERMAL | Status: DC
  Administered 2020-10-18: 16:00:00 1.5 mg via TRANSDERMAL
  Filled 2020-10-18 (×2): qty 1

## 2020-10-18 MED ORDER — MORPHINE SULFATE (PF) 2 MG/ML IV SOLN: 2.0000 mg | INTRAVENOUS | Status: DC | PRN

## 2020-10-18 NOTE — Progress Notes (Addendum)
2:45pm: CSW arranged for transportation via [facility].  2:20pm: CSW received return call from [place] at [facility] who is requesting orders be placed for the patient's comfort care medications.  CSW notified Dr. Sloan Leiter of request.  11:45am: CSW spoke with Ebony Hail at [facility] to make referral for this patient. Ebony Hail to contact the patient's son to discuss discharge.  Madilyn Fireman, MSW, LCSW-A Transitions of Care  Clinical Social Worker I [facility] Emergency Departments  Medical ICU [phone]

## 2020-10-18 NOTE — Consult Note (Signed)
Consultation Note Date: 10/18/2020   Patient Name: Warren [name]  DOB: 13-May-1922  MRN: [id number]  Age / Sex: 86 y.o., male  PCP: Pcp, No Referring Physician: Barb Merino, MD  Reason for Consultation: Disposition and Establishing goals of care  HPI/Patient Profile: 86 y.o. male  with past medical history of coronary artery disease, peripheral artery disease, dementia, anxiety/depression, emphysema, esophageal dysfunction, hypertension, and history of prostate cancer. He presented to the emergency department from his nursing home on 10/17/2020 with altered mental status. Nursing home staff became concerned that he was not interactive and was having difficulty holding a spoon, which is a change from his baseline.  ED Course: urinalysis not concerning for infection, urine culture pending. Flu and covid negative. Head CT showed right otitis externa but otherwise negative for acute abnormalities. Chest x-ray showing hypoinflation.  Palliative care consulted to assist with goals of care.   Clinical Assessment and Goals of Care: I have reviewed medical records including EPIC notes, labs and imaging, and examined the patient. I met at bedside with [name]  to discuss diagnosis, prognosis, GOC, EOL wishes, disposition, and options.  I introduced Palliative Medicine as specialized medical care for people living with serious illness. It focuses on providing relief from the symptoms and stress of a serious illness.   We discussed a brief life review of the patient. 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 He later worked in a [employer] until he retired at age 83. He has 4 children. His wife passed away about 12 years ago. He lived independently up until he was 86 years old.   He has resided at [facility] for the past 2 years. As far as baseline functional status, he was mostly  wheelchair bound, but could stand to transfer. He did have dementia, but it was mild with only mild memory impairment. He was interactive and continued to recognize all his family members.    We discussed his current illness and what it means in the larger context of his ongoing co-morbidities.  Natural expectations at EOL was discussed.  I reviewed the concept of a comfort path with Annette. Family clearly does not want to pursue additional work-up or aggressive medical interventions. Discussed the goal of comfort rather than prolonging life. Discussed hospice philosophy and provided information on home vs residential hospice services - answered all questions.   Advanced directives, concepts specific to code status, artifical feeding and hydration, and rehospitalization were considered and discussed. Discussed that IV fluids are not recommended at EOL as they can prolong the porcess - Annette verbalizes understanding and agrees.   Questions and concerns were addressed.  The family was encouraged to call with questions or concerns.    Primary decision maker: [name] and [name]    SUMMARY OF RECOMMENDATIONS   - DNR/DNI - acute encephalopathy with unclear etiology, initial work-up negative for signs of infection - suspect rapid decline is irreversible and due to advanced age  - family wishes to pursue  hospice care - residential facility in [facility] does not currently have a bed - plan to return to SNF with hospice and then transition to the facility when a bed is available  Code Status/Advance Care Planning:  DNR  Symptom Management:  - morphine concentrate solution 5 mg po every 4 hours as needed for pain or dyspnea - scopolamine patch   Palliative Prophylaxis:   Oral Care and Turn Reposition  Additional Recommendations (Limitations, Scope, Preferences):  Full Comfort Care  Psycho-social/Spiritual:   Created space and opportunity for family to express thoughts and  feelings regarding patient's current medical situation.   Emotional support provided   Prognosis:   < 2 weeks  Discharge Planning: [place] with Hospice      Primary Diagnoses: Present on Admission: . Acute encephalopathy . Anxiety and depression . CAD (coronary artery disease) . Claudication ([place]) . Dementia ([place]) . Dyslipidemia . Esophageal dysfunction . HYPERTENSION, BENIGN   I have reviewed the medical record, interviewed the patient and family, and examined the patient. The following aspects are pertinent.  Past Medical History:  Diagnosis Date  . CAD (coronary artery disease)    moderate by catheterization,11/08...essentially unchanged by repeat study,10/11  . Carotid artery disease ([facility])     less than 50% stenosis bilaterally March 2013   . Claudication ([place])     ABIs within ABIs within normal limits, 40% left iliac disease by catheterization   . Colon polyps   . COVID-19 10/2019  . Depression   . Dizzy   . Edema extremities     secondary to severe varicose veins. Nonpitting edema and dermatitis.   . Emphysema   . Glaucoma   . Hyperlipidemia   . IBS (irritable bowel syndrome)   . Mitral regurgitation    mild  . Mixed dyslipidemia   . Peripheral edema   . Pneumonia   . Prostate cancer [facility])    s/p radiation treatment/s/p TURP  . Shortness of breath    Emphysema  . Thyroid nodule     posterior and inferior to the right lobepossibly outside of the gland secondary to parathyroid adenoma in addition multiple bilateral thyroid nodules    Social History   Socioeconomic History  . Marital status: Widowed    Spouse name: Not on file  . Number of children: 4  . Years of education: Not on file  . Highest education level: Not on file  Occupational History  . Occupation: RETIRED    Comment: Engineer, manufacturing systems at [employer]  . Smoking status: Former Smoker    Packs/day: 1.00    Years: 40.00    Pack years: 40.00    Types:  Cigarettes    Quit date: 11/04/1983    Years since quitting: 36.9  . Smokeless tobacco: Never Used  Vaping Use  . Vaping Use: Never used  Substance and Sexual Activity  . Alcohol use: No  . Drug use: No  . Sexual activity: Not on file  Other Topics Concern  . Not on file  Social History Narrative   Has four children   World War II Veteran/ he was in the [employer] and the [employer] during WWII    Family History  Problem Relation Age of Onset  . Heart attack Sister        in her 30's  . Other Mother        lived to be 45  . Pancreatic cancer Father   . Crohn's disease Son   .  Colon cancer Neg Hx   . Esophageal cancer Neg Hx   . Stomach cancer Neg Hx   . Liver disease Neg Hx    Scheduled Meds: . docusate sodium  100 mg Oral Daily  . ipratropium-albuterol  3 mL Nebulization Q6H  . mirabegron ER  25 mg Oral Daily  . neomycin-polymyxin-hydrocortisone  4 drop Right EAR BID  . pantoprazole  40 mg Oral BID  . pravastatin  20 mg Oral QPM  . sodium chloride flush  3 mL Intravenous Q12H  . tamsulosin  0.4 mg Oral Daily   Continuous Infusions: PRN Meds:.acetaminophen **OR** acetaminophen, albuterol, morphine injection, polyethylene glycol Medications Prior to Admission:  Prior to Admission medications   Medication Sig Start Date End Date Taking? Authorizing Provider  acetaminophen (TYLENOL) 500 MG tablet Take 500 mg by mouth every 8 (eight) hours as needed for mild pain.    Yes [provider]  ascorbic acid (VITAMIN C) 500 MG tablet Take 500 mg by mouth daily.   Yes [provider]  calcium carbonate (OS-CAL - DOSED IN MG OF ELEMENTAL CALCIUM) 1250 (500 Ca) MG tablet Take 1,250 mg by mouth 2 (two) times daily.    Yes [provider]  Cholecalciferol 50 MCG (2000 UT) TABS Take 2,000 Units by mouth daily.    Yes [provider]  citalopram (CELEXA) 20 MG tablet Take 20 mg by mouth daily.   Yes [provider]  CORTISPORIN-TC  3.01-03-09-0.5 MG/ML OTIC suspension Place 4 drops into the right ear in the morning and at bedtime. 10/16/20  Yes [provider]  divalproex (DEPAKOTE SPRINKLE) 125 MG capsule Take 2 capsules by mouth in the morning and at bedtime. 09/21/20  Yes [provider]  docusate sodium (COLACE) 100 MG capsule Take 100 mg by mouth daily.   Yes [provider]  ferrous sulfate 325 (65 FE) MG EC tablet Take 325 mg by mouth daily.   Yes [provider]  fluticasone (FLONASE) 50 MCG/ACT nasal spray Place 1 spray into both nostrils 2 (two) times daily. 10/28/19  Yes Arrien, Jimmy Picket, MD  LORazepam (ATIVAN) 0.5 MG tablet Take 0.25 mg by mouth at bedtime.   Yes [provider]  MELATONIN PO Take 7 mg by mouth at bedtime.   Yes [provider]  mirabegron ER (MYRBETRIQ) 25 MG TB24 tablet Take 25 mg by mouth daily.   Yes [provider]  pantoprazole (PROTONIX) 40 MG tablet Take 40 mg by mouth 2 (two) times daily.   Yes [provider]  potassium chloride SA (KLOR-CON) 20 MEQ tablet Take 20 mEq by mouth daily.   Yes [provider]  pravastatin (PRAVACHOL) 20 MG tablet Take 20 mg by mouth every evening.   Yes [provider]  rivaroxaban (XARELTO) 20 MG TABS tablet Take 20 mg by mouth every evening.   Yes [provider]  tamsulosin (FLOMAX) 0.4 MG CAPS capsule Take 0.4 mg by mouth daily.   Yes [provider]  pantoprazole (PROTONIX) 40 MG tablet Take 1 tablet (40 mg total) by mouth 2 (two) times daily. Patient not taking: Reported on 10/17/2020 11/29/19 03/28/20  Earlene Plater, MD  atorvastatin (LIPITOR) 40 MG tablet Take 40 mg by mouth at bedtime.    01/27/12  [provider]  furosemide (LASIX) 20 MG tablet Take 1 tablet (20 mg total) by mouth daily. 07/30/11 01/27/12  de Stanford [place], MD   Allergies  Allergen Reactions  . Aspirin Other (  See Comments)    Reaction - spots  . Cephalexin  Other (See Comments)    Reported by [facility] 08/19/2018  . Latex Other (See Comments)    Unknown reaction  . Penicillins Other (See Comments)    Per MAR Has patient had a PCN reaction causing immediate rash, facial/tongue/throat swelling, SOB or lightheadedness with hypotension:No Has patient had a PCN reaction causing severe rash involving mucus membranes or skin necrosis: No Has patient had a PCN reaction that required hospitalization:NoHas patient had a PCN reaction occurring within the last 10 years:No If all of the above answers are "NO", then may proceed with Cephalosporin use.    Review of Systems  Unable to perform ROS: Mental status change    Physical Exam Constitutional:      General: He is not in acute distress.    Comments: somnolent  Cardiovascular:     Rate and Rhythm: Normal rate and regular rhythm.  Pulmonary:     Effort: Pulmonary effort is normal.     Vital Signs: BP 135/75   Pulse 84   Temp 98.6 F (37 C)   Resp (!) 22   SpO2 96%  Pain Scale: 0-10   Pain Score: Asleep   SpO2: SpO2: 96 % O2 Device:SpO2: 96 % O2 Flow Rate: .   IO: Intake/output summary:   Intake/Output Summary (Last 24 hours) at 10/18/2020 1335 Last data filed at 10/17/2020 2302 Gross per 24 hour  Intake 50 ml  Output --  Net 50 ml      Palliative Assessment/Data: PPS 10%    Time In: 13:35 Time Out: 14:46 Time Total: 71 minutes Greater than 50%  of this time was spent counseling and coordinating care related to the above assessment and plan.  Signed by: Lavena Bullion, NP   Please contact Palliative Medicine Team phone at [phone] for questions and concerns.  For individual provider: See Shea Evans

## 2020-10-18 NOTE — Discharge Summary (Signed)
Physician Discharge Summary  Warren [name] ZCH:[id number] DOB: 1922/09/07 DOA: 10/17/2020  PCP: Pcp, No  Admit date: 10/17/2020 Discharge date: 10/18/2020  Admitted From: Long-term nursing home Disposition: Long-term nursing home with hospice in place  Recommendations for Outpatient Follow-up:  1. As per [facility]: Not applicable Equipment/Devices: Not applicable   Discharge Condition: Fair, poor health statuspoor health status  CODE STATUS:DNR  Diet recommendation: comfort feeding   Discharge Summary: 87 year old gentleman with history of anxiety/depression, coronary artery disease, peripheral arterial disease, dementia, emphysema, esophageal dysfunction, hypertension presented from nursing home with worsening mental status.  According to the family members, he has recently have declined mental status and worse for last 1 week.  He is not able to walk.  In the emergency room, blood pressures were stable.  COVID-19 negative.  Influenza swab negative.  Urinalysis with no significant findings.  CT head normal.  MRI of the brain with no acute findings, severe atrophy of the brain.  Acute metabolic encephalopathy in the setting of advanced dementia, debility, failure to thrive Underlying multiple medical problems Esophageal dysfunction  Plan: Given overall poor clinical status and patient's previous wishes, patient was seen by palliative care team.  Patient's family wished a palliative care and hospice approach. No escalation of care.  No lab draws. Patient was started on comfort care measures. Patient will be going back to [facility] where he is a long-term resident, he will be seen by hospice provider at the nursing home.  If necessary, they will transfer him to inpatient hospice, however they may provide him with end-of-life care. Comfort care medications were prescribed including scopolamine, morphine solution and Ativan.  Further medication will  be taken care of by hospice provider. No indication to continue long-term medications including Xarelto. Comfort feeding. Stable to transfer to a skilled nursing facility.  Discharge Diagnoses:  Principal Problem:   Acute encephalopathy Active Problems:   Dyslipidemia   HYPERTENSION, BENIGN   Claudication (HCC)   CAD (coronary artery disease)   Anxiety and depression   Esophageal dysfunction   Dementia [facility])    Discharge Instructions  Discharge Instructions    Diet general   Complete by: As directed    Comfort feeding as much as he can safely eat   Discharge instructions   Complete by: As directed    Consult [facility] once patient arrives to facility   Increase activity slowly   Complete by: As directed      Allergies as of 10/18/2020      Reactions   Aspirin Other (See Comments)   Reaction - spots   Cephalexin Other (See Comments)   Reported by [facility] 08/19/2018   Latex Other (See Comments)   Unknown reaction   Penicillins Other (See Comments)   Per MAR Has patient had a PCN reaction causing immediate rash, facial/tongue/throat swelling, SOB or lightheadedness with hypotension:No Has patient had a PCN reaction causing severe rash involving mucus membranes or skin necrosis: No Has patient had a PCN reaction that required hospitalization:NoHas patient had a PCN reaction occurring within the last 10 years:No If all of the above answers are "NO", then may proceed with Cephalosporin use.      Medication List    STOP taking these medications   ascorbic acid 500 MG tablet Commonly known as: VITAMIN C   calcium carbonate 1250 (500 Ca) MG tablet Commonly known as: OS-CAL - dosed in mg of elemental calcium   Cholecalciferol 50 MCG (  2000 UT) Tabs   ferrous sulfate 325 (65 FE) MG EC tablet   potassium chloride SA 20 MEQ tablet Commonly known as: KLOR-CON   pravastatin 20 MG tablet Commonly known as: PRAVACHOL   rivaroxaban 20 MG Tabs  tablet Commonly known as: XARELTO     TAKE these medications   acetaminophen 500 MG tablet Commonly known as: TYLENOL Take 500 mg by mouth every 8 (eight) hours as needed for mild pain.   citalopram 20 MG tablet Commonly known as: CELEXA Take 20 mg by mouth daily.   Cortisporin-TC 3.01-03-09-0.5 MG/ML OTIC suspension Generic drug: neomycin-colistin-hydrocortisone-thonzonium Place 4 drops into the right ear in the morning and at bedtime.   divalproex 125 MG capsule Commonly known as: DEPAKOTE SPRINKLE Take 2 capsules by mouth in the morning and at bedtime.   docusate sodium 100 MG capsule Commonly known as: COLACE Take 100 mg by mouth daily.   fluticasone 50 MCG/ACT nasal spray Commonly known as: FLONASE Place 1 spray into both nostrils 2 (two) times daily.   LORazepam 1 MG tablet Commonly known as: Ativan Take 1 tablet (1 mg total) by mouth every 4 (four) hours as needed for anxiety (air hunger, comfort care). What changed:   medication strength  how much to take  when to take this  reasons to take this   MELATONIN PO Take 7 mg by mouth at bedtime.   mirabegron ER 25 MG Tb24 tablet Commonly known as: MYRBETRIQ Take 25 mg by mouth daily.   morphine CONCENTRATE 10 MG/0.5ML Soln concentrated solution Take 0.25 mLs (5 mg total) by mouth every 4 (four) hours as needed for severe pain (or dyspnea).   pantoprazole 40 MG tablet Commonly known as: PROTONIX Take 40 mg by mouth 2 (two) times daily. What changed: Another medication with the same name was removed. Continue taking this medication, and follow the directions you see here.   scopolamine 1 MG/3DAYS Commonly known as: TRANSDERM-SCOP Place 1 patch (1.5 mg total) onto the skin every 3 (three) days.   tamsulosin 0.4 MG Caps capsule Commonly known as: FLOMAX Take 0.4 mg by mouth daily.       Allergies  Allergen Reactions  . Aspirin Other (See Comments)    Reaction - spots  . Cephalexin Other (See  Comments)    Reported by [facility] 08/19/2018  . Latex Other (See Comments)    Unknown reaction  . Penicillins Other (See Comments)    Per MAR Has patient had a PCN reaction causing immediate rash, facial/tongue/throat swelling, SOB or lightheadedness with hypotension:No Has patient had a PCN reaction causing severe rash involving mucus membranes or skin necrosis: No Has patient had a PCN reaction that required hospitalization:NoHas patient had a PCN reaction occurring within the last 10 years:No If all of the above answers are "NO", then may proceed with Cephalosporin use.     Consultations:  Palliative care    Procedures/Studies: CT Head Wo Contrast  Result Date: 10/17/2020 CLINICAL DATA:  Mental status change, unknown cause also, R sided otitis exterma - eval for signs of osteo/mastoiditis. EXAM: CT HEAD WITHOUT CONTRAST TECHNIQUE: Contiguous axial images were obtained from the base of the skull through the vertex without intravenous contrast. COMPARISON:  Report from prior exams at an outside institution, no images available. FINDINGS: Brain: Age related atrophy. No ventriculomegaly is likely due to central atrophy. Advanced periventricular and deep white matter hypodensity, typical of chronic small vessel ischemia. Remote lacunar infarct in the right basal ganglia. No  hemorrhage or evidence of acute infarct. No subdural or extra-axial collection. Vascular: Atherosclerosis of skullbase vasculature without hyperdense vessel or abnormal calcification. Skull: No fracture or focal lesion. Sinuses/Orbits: Minimal soft tissue density in the right external auditory canal. No evidence of subjacent bony destruction or abnormal mastoid air cell opacification. Mastoid air cells are clear. Left mastoid air cells are clear. Mucosal thickening of the anterior right maxillary sinus, scattered ethmoid air cells. Bilateral cataract resection. Other: Soft tissue thickening of the right external  auditory canal and pinna. No focal fluid collection. IMPRESSION: 1. Soft tissue thickening of the right external auditory canal and pinna consistent with otitis externa. No evidence of subjacent bony destruction or abnormal mastoid air cell opacification. 2. Age related atrophy and advanced chronic small vessel ischemia. Remote lacunar infarct in the right basal ganglia. No acute intracranial abnormality. Electronically Signed   By: Keith Rake M.D.   On: 10/17/2020 16:21   MR BRAIN WO CONTRAST  Result Date: 10/17/2020 CLINICAL DATA:  Acute neurologic deficit EXAM: MRI HEAD WITHOUT CONTRAST TECHNIQUE: Multiplanar, multiecho pulse sequences of the brain and surrounding structures were obtained without intravenous contrast. COMPARISON:  None. FINDINGS: Brain: No acute infarct, mass effect or extra-axial collection. No acute or chronic hemorrhage. Confluent hyperintense T2-weighted white matter signal. Diffuse, severe atrophy. The midline structures are normal. Vascular: Major flow voids are preserved. Skull and upper cervical spine: Normal calvarium and skull base. Visualized upper cervical spine and soft tissues are normal. Sinuses/Orbits:No paranasal sinus fluid levels or advanced mucosal thickening. No mastoid or middle ear effusion. Normal orbits. IMPRESSION: 1. No acute intracranial abnormality. 2. Diffuse, severe atrophy and findings of chronic small vessel disease. Electronically Signed   By: Ulyses Jarred M.D.   On: 10/17/2020 21:42   DG Chest Port 1 View  Result Date: 10/17/2020 CLINICAL DATA:  Altered mental status EXAM: PORTABLE CHEST 1 VIEW COMPARISON:  10/24/2019 FINDINGS: Lungs volumes are extremely small, but are symmetric and are clear. No pneumothorax or pleural effusion. Cardiac size within normal limits. Pulmonary vascularity is normal. Osseous structures are age-appropriate. No acute bone abnormality. IMPRESSION: Pulmonary hypoinflation. Electronically Signed   By: Fidela Salisbury MD    On: 10/17/2020 19:11    (Echo, Carotid, EGD, Colonoscopy, ERCP)    Subjective: patient seen and examined, remains quiet, unable to interact.  Occasionally anxious and agitated with heavy breathing.  Made with family members at the bedside.   Discharge Exam: Vitals:   10/18/20 1300 10/18/20 1330  BP: (!) 144/81 139/74  Pulse: 83 80  Resp: 20 (!) 22  Temp:    SpO2: 96% 97%   Vitals:   10/18/20 1230 10/18/20 1245 10/18/20 1300 10/18/20 1330  BP: 135/75  (!) 144/81 139/74  Pulse: 84 84 83 80  Resp: 20 (!) 22 20 (!) 22  Temp:      TempSrc:      SpO2: 96% 96% 96% 97%    General: Pt mostly sleepy, lethargic, unable to have interaction. Chronically sick looking.  Intermittently agitated and uncomfortable.  Anxious looking. Cardiovascular: RRR, S1/S2 +, no rubs, no gallops Respiratory: CTA bilaterally, no wheezing, no rhonchi Abdominal: Soft, NT, ND, bowel sounds + Extremities: no edema, no cyanosis    The results of significant diagnostics from this hospitalization (including imaging, microbiology, ancillary and laboratory) are listed below for reference.     Microbiology: Recent Results (from the past 240 hour(s))  Resp Panel by RT-PCR (Flu A&B, Covid) Nasopharyngeal Swab     Status: None  Collection Time: 10/17/20  6:51 PM   Specimen: Nasopharyngeal Swab; Nasopharyngeal(NP) swabs in vial transport medium  Result Value Ref Range Status   SARS Coronavirus 2 by RT PCR NEGATIVE NEGATIVE Final    Comment: (NOTE) SARS-CoV-2 target nucleic acids are NOT DETECTED.  The SARS-CoV-2 RNA is generally detectable in upper respiratory specimens during the acute phase of infection. The lowest concentration of SARS-CoV-2 viral copies this assay can detect is 138 copies/mL. A negative result does not preclude SARS-Cov-2 infection and should not be used as the sole basis for treatment or other patient management decisions. A negative result may occur with  improper specimen  collection/handling, submission of specimen other than nasopharyngeal swab, presence of viral mutation(s) within the areas targeted by this assay, and inadequate number of viral copies(<138 copies/mL). A negative result must be combined with clinical observations, patient history, and epidemiological information. The expected result is Negative.  Fact Sheet for Patients:  EntrepreneurPulse.com.au  Fact Sheet for Healthcare Providers:  IncredibleEmployment.be  This test is no t yet approved or cleared by the [place] FDA and  has been authorized for detection and/or diagnosis of SARS-CoV-2 by FDA under an Emergency Use Authorization (EUA). This EUA will remain  in effect (meaning this test can be used) for the duration of the COVID-19 declaration under Section 564(b)(1) of the Act, 21 U.S.C.section 360bbb-3(b)(1), unless the authorization is terminated  or revoked sooner.       Influenza A by PCR NEGATIVE NEGATIVE Final   Influenza B by PCR NEGATIVE NEGATIVE Final    Comment: (NOTE) The Xpert Xpress SARS-CoV-2/FLU/RSV plus assay is intended as an aid in the diagnosis of influenza from Nasopharyngeal swab specimens and should not be used as a sole basis for treatment. Nasal washings and aspirates are unacceptable for Xpert Xpress SARS-CoV-2/FLU/RSV testing.  Fact Sheet for Patients: EntrepreneurPulse.com.au  Fact Sheet for Healthcare Providers: IncredibleEmployment.be  This test is not yet approved or cleared by the [place] FDA and has been authorized for detection and/or diagnosis of SARS-CoV-2 by FDA under an Emergency Use Authorization (EUA). This EUA will remain in effect (meaning this test can be used) for the duration of the COVID-19 declaration under Section 564(b)(1) of the Act, 21 U.S.C. section 360bbb-3(b)(1), unless the authorization is terminated or revoked.  Performed at [facility], [place] [address], [place], [place] [address]      Labs: BNP (last 3 results) No results for input(s): BNP in the last 8760 hours. Basic Metabolic Panel: Recent Labs  Lab 10/17/20 1556 10/18/20 0310  NA 134* 133*  K 4.6 5.1  CL 96* 94*  CO2 28 24  GLUCOSE 125* 114*  BUN 13 14  CREATININE 0.91 0.78  CALCIUM 9.5 9.0  MG  --  1.8   Liver Function Tests: Recent Labs  Lab 10/17/20 1556 10/18/20 0310  AST 15 23  ALT 9 7  ALKPHOS 31* 32*  BILITOT 0.9 1.6*  PROT 6.0* 5.4*  ALBUMIN 3.0* 2.9*   No results for input(s): LIPASE, AMYLASE in the last 168 hours. No results for input(s): AMMONIA in the last 168 hours. CBC: Recent Labs  Lab 10/17/20 1556 10/18/20 0502  WBC 10.0 17.9*  NEUTROABS 8.2*  --   HGB 15.0 15.5  HCT 46.0 46.1  MCV 95.8 95.4  PLT 169 162   Cardiac Enzymes: No results for input(s): CKTOTAL, CKMB, CKMBINDEX, TROPONINI in the last 168 hours. BNP: Invalid input(s): POCBNP CBG: Recent Labs  Lab 10/17/20 1705  GLUCAP 110*   D-Dimer No results for input(s): DDIMER in the last 72 hours. Hgb A1c No results for input(s): HGBA1C in the last 72 hours. Lipid Profile No results for input(s): CHOL, HDL, LDLCALC, TRIG, CHOLHDL, LDLDIRECT in the last 72 hours. Thyroid function studies Recent Labs    10/18/20 0035  TSH 2.667   Anemia work up No results for input(s): VITAMINB12, FOLATE, FERRITIN, TIBC, IRON, RETICCTPCT in the last 72 hours. Urinalysis    Component Value Date/Time   COLORURINE YELLOW 10/17/2020 2000   APPEARANCEUR CLEAR 10/17/2020 2000   LABSPEC 1.020 10/17/2020 2000   PHURINE 5.0 10/17/2020 2000   GLUCOSEU NEGATIVE 10/17/2020 2000   HGBUR NEGATIVE 10/17/2020 [place] NEGATIVE 10/17/2020 2000   KETONESUR 20 (A) 10/17/2020 2000   PROTEINUR NEGATIVE 10/17/2020 2000   NITRITE NEGATIVE 10/17/2020 2000   LEUKOCYTESUR NEGATIVE 10/17/2020 2000   Sepsis Labs Invalid input(s): PROCALCITONIN,  WBC,   LACTICIDVEN Microbiology Recent Results (from the past 240 hour(s))  Resp Panel by RT-PCR (Flu A&B, Covid) Nasopharyngeal Swab     Status: None   Collection Time: 10/17/20  6:51 PM   Specimen: Nasopharyngeal Swab; Nasopharyngeal(NP) swabs in vial transport medium  Result Value Ref Range Status   SARS Coronavirus 2 by RT PCR NEGATIVE NEGATIVE Final    Comment: (NOTE) SARS-CoV-2 target nucleic acids are NOT DETECTED.  The SARS-CoV-2 RNA is generally detectable in upper respiratory specimens during the acute phase of infection. The lowest concentration of SARS-CoV-2 viral copies this assay can detect is 138 copies/mL. A negative result does not preclude SARS-Cov-2 infection and should not be used as the sole basis for treatment or other patient management decisions. A negative result may occur with  improper specimen collection/handling, submission of specimen other than nasopharyngeal swab, presence of viral mutation(s) within the areas targeted by this assay, and inadequate number of viral copies(<138 copies/mL). A negative result must be combined with clinical observations, patient history, and epidemiological information. The expected result is Negative.  Fact Sheet for Patients:  EntrepreneurPulse.com.au  Fact Sheet for Healthcare Providers:  IncredibleEmployment.be  This test is no t yet approved or cleared by the [place] FDA and  has been authorized for detection and/or diagnosis of SARS-CoV-2 by FDA under an Emergency Use Authorization (EUA). This EUA will remain  in effect (meaning this test can be used) for the duration of the COVID-19 declaration under Section 564(b)(1) of the Act, 21 U.S.C.section 360bbb-3(b)(1), unless the authorization is terminated  or revoked sooner.       Influenza A by PCR NEGATIVE NEGATIVE Final   Influenza B by PCR NEGATIVE NEGATIVE Final    Comment: (NOTE) The Xpert Xpress SARS-CoV-2/FLU/RSV plus  assay is intended as an aid in the diagnosis of influenza from Nasopharyngeal swab specimens and should not be used as a sole basis for treatment. Nasal washings and aspirates are unacceptable for Xpert Xpress SARS-CoV-2/FLU/RSV testing.  Fact Sheet for Patients: EntrepreneurPulse.com.au  Fact Sheet for Healthcare Providers: IncredibleEmployment.be  This test is not yet approved or cleared by the [place] FDA and has been authorized for detection and/or diagnosis of SARS-CoV-2 by FDA under an Emergency Use Authorization (EUA). This EUA will remain in effect (meaning this test can be used) for the duration of the COVID-19 declaration under Section 564(b)(1) of the Act, 21 U.S.C. section 360bbb-3(b)(1), unless the authorization is terminated or revoked.  Performed at [facility], [place] [address], [place],  [address]  Time coordinating discharge:  40 minutes  SIGNED:   Barb Merino, MD  [facility] 10/18/2020, 2:54 PM

## 2020-10-18 NOTE — ED Provider Notes (Signed)
[facility] EMERGENCY DEPARTMENT Provider Note   CSN: [id number] Arrival date & time: 10/17/20  1422     History Chief Complaint  Patient presents with  . Altered Mental Status    Warren [name] is a 86 y.o. male.  HPI     86 year old male comes in with chief complaint of altered mental status.  Level 5 caveat for dementia.  Patient here with his son and daughter who are at the bedside.  Patient resides at a nursing facility.  I called the nursing facility and was informed by staff, who does not particularly know the patient well, that patient had an episode of difficulty in moving one side.  She suspects it was the right side, which prompted them to call EMS.  Patient was unable to feed himself which was unusual.  Family reports that the daughter visited the patient on Friday.  He was not at his baseline normal but they thought that he was just having a bad day.  Patient's son saw him on Monday and noted that patient was more sleepy than usual.  Patient was started on antibiotics for ear infection recently.  No new medications.  There is no history of recurrent UTI and similar symptoms.  At baseline patient is energetic and able to hold a conversation.  He is able to always feed himself.  Patient has no complaints from his side besides mentioning some neck discomfort on the left side.  He denies any headaches, new cough, abdominal pain, nausea, vomiting, diarrhea.   Past Medical History:  Diagnosis Date  . CAD (coronary artery disease)    moderate by catheterization,11/08...essentially unchanged by repeat study,10/11  . Carotid artery disease (HCC)     less than 50% stenosis bilaterally March 2013   . Claudication ([place])     ABIs within ABIs within normal limits, 40% left iliac disease by catheterization   . Colon polyps   . COVID-19 10/2019  . Depression   . Dizzy   . Edema extremities     secondary to severe varicose veins. Nonpitting edema and  dermatitis.   . Emphysema   . Glaucoma   . Hyperlipidemia   . IBS (irritable bowel syndrome)   . Mitral regurgitation    mild  . Mixed dyslipidemia   . Peripheral edema   . Pneumonia   . Prostate cancer [facility])    s/p radiation treatment/s/p TURP  . Shortness of breath    Emphysema  . Thyroid nodule     posterior and inferior to the right lobepossibly outside of the gland secondary to parathyroid adenoma in addition multiple bilateral thyroid nodules     Patient Active Problem List   Diagnosis Date Noted  . Acute encephalopathy 10/17/2020  . Acute esophagitis   . Odynophagia 11/27/2019  . Acute cystitis without hematuria   . Dysphagia 11/25/2019  . DNR (do not resuscitate) 10/25/2019  . Acute respiratory failure with hypoxia ([place]) 10/24/2019  . Pneumonia due to COVID-19 virus 10/24/2019  . Barrett's esophagus 04/03/2018  . Candida esophagitis ([place]) 04/03/2018  . Anxiety 04/03/2018  . Esophageal dysfunction 03/23/2018  . Urinary tract infection associated with catheterization of urinary tract, initial encounter ([place]) 03/23/2018  . Leukocytosis 03/23/2018  . Dementia ([place]) 03/23/2018  . Dehydration   . Acute metabolic encephalopathy   . Nausea and vomiting 03/22/2018  . Pseudomonas urinary tract infection 03/06/2018  . Depression 03/06/2018  . Anxiety and depression 03/06/2018  . Osteoporosis 03/06/2018  . Acute lower  UTI 03/01/2018  . Internal device, implant, or graft infection or inflammation 03/01/2018  . Emphysema   . Thyroid nodule   . Carotid artery disease ([place])   . CAD (coronary artery disease)   . Edema extremities   . Claudication ([place]) 07/30/2011  . Dyslipidemia 07/03/2010  . HYPERTENSION, BENIGN 07/03/2010  . PERIPHERAL EDEMA 07/03/2010  . SHORTNESS OF BREATH 07/03/2010  . PRECORDIAL PAIN 07/03/2010    Past Surgical History:  Procedure Laterality Date  . BIOPSY  03/24/2018   Procedure: BIOPSY;  Surgeon: Jackquline Denmark, MD;  Location: [facility];   Service: Endoscopy;;  . CARDIAC CATHETERIZATION  2008  . CHOLECYSTECTOMY    . CYSTOSCOPY N/A 02/27/2018   Procedure: CYSTOSCOPY;  Surgeon: Bjorn Loser, MD;  Location: [facility];  Service: Urology;  Laterality: N/A;  . ESOPHAGOGASTRODUODENOSCOPY Left 11/28/2019   Procedure: ESOPHAGOGASTRODUODENOSCOPY (EGD);  Surgeon: Thornton Park, MD;  Location: [facility];  Service: Gastroenterology;  Laterality: Left;  . ESOPHAGOGASTRODUODENOSCOPY (EGD) WITH PROPOFOL N/A 03/24/2018   Procedure: ESOPHAGOGASTRODUODENOSCOPY (EGD) WITH PROPOFOL;  Surgeon: Jackquline Denmark, MD;  Location: [facility];  Service: Endoscopy;  Laterality: N/A;  . ESOPHAGOGASTRODUODENOSCOPY (EGD) WITH PROPOFOL N/A 11/26/2019   Procedure: ESOPHAGOGASTRODUODENOSCOPY (EGD) WITH PROPOFOL;  Surgeon: Juanita Craver, MD;  Location: [facility] ENDOSCOPY;  Service: Endoscopy;  Laterality: N/A;  . ESOPHAGOGASTRODUODENOSCOPY (EGD) WITH PROPOFOL N/A 02/23/2020   Procedure: ESOPHAGOGASTRODUODENOSCOPY (EGD) WITH PROPOFOL;  Surgeon: [name], MD;  Location: [facility];  Service: Endoscopy;  Laterality: N/A;  DIL   . FOREIGN BODY REMOVAL  11/26/2019   Procedure: FOREIGN BODY REMOVAL;  Surgeon: Juanita Craver, MD;  Location: [facility] ENDOSCOPY;  Service: Endoscopy;;  food bolus  . INCONTINENCE SURGERY     ring inserted  . MALONEY DILATION  03/24/2018   Procedure: Venia Minks DILATION;  Surgeon: Jackquline Denmark, MD;  Location: [facility];  Service: Endoscopy;;  . TONSILLECTOMY    . TRANSURETHRAL RESECTION OF PROSTATE     has stress urinary incontinence  . URINARY SPHINCTER IMPLANT N/A 02/27/2018   Procedure: REMOVAL INFECTED ARTIFICIAL URINARY SPHINCTER;  Surgeon: Bjorn Loser, MD;  Location: [facility];  Service: Urology;  Laterality: N/A;       Family History  Problem Relation Age of Onset  . Heart attack Sister        in her 77's  . Other Mother        lived to be 34  . Pancreatic cancer Father   . Crohn's disease Son   . Colon cancer Neg Hx   .  Esophageal cancer Neg Hx   . Stomach cancer Neg Hx   . Liver disease Neg Hx     Social History   Tobacco Use  . Smoking status: Former Smoker    Packs/day: 1.00    Years: 40.00    Pack years: 40.00    Types: Cigarettes    Quit date: 11/04/1983    Years since quitting: 36.9  . Smokeless tobacco: Never Used  Vaping Use  . Vaping Use: Never used  Substance Use Topics  . Alcohol use: No  . Drug use: No    Home Medications Prior to Admission medications   Medication Sig Start Date End Date Taking? Authorizing Provider  acetaminophen (TYLENOL) 500 MG tablet Take 500 mg by mouth every 8 (eight) hours as needed for mild pain.    Yes [provider]  ascorbic acid (VITAMIN C) 500 MG tablet Take 500 mg by mouth daily.   Yes [provider]  calcium carbonate (  OS-CAL - DOSED IN MG OF ELEMENTAL CALCIUM) 1250 (500 Ca) MG tablet Take 1,250 mg by mouth 2 (two) times daily.    Yes [provider]  Cholecalciferol 50 MCG (2000 UT) TABS Take 2,000 Units by mouth daily.    Yes [provider]  citalopram (CELEXA) 20 MG tablet Take 20 mg by mouth daily.   Yes [provider]  CORTISPORIN-TC 3.01-03-09-0.5 MG/ML OTIC suspension Place 4 drops into the right ear in the morning and at bedtime. 10/16/20  Yes [provider]  divalproex (DEPAKOTE SPRINKLE) 125 MG capsule Take 2 capsules by mouth in the morning and at bedtime. 09/21/20  Yes [provider]  docusate sodium (COLACE) 100 MG capsule Take 100 mg by mouth daily.   Yes [provider]  ferrous sulfate 325 (65 FE) MG EC tablet Take 325 mg by mouth daily.   Yes [provider]  fluticasone (FLONASE) 50 MCG/ACT nasal spray Place 1 spray into both nostrils 2 (two) times daily. 10/28/19  Yes Arrien, Jimmy Picket, MD  LORazepam (ATIVAN) 0.5 MG tablet Take 0.25 mg by mouth at bedtime.   Yes [provider]  MELATONIN PO Take 7 mg by mouth at bedtime.   Yes  [provider]  mirabegron ER (MYRBETRIQ) 25 MG TB24 tablet Take 25 mg by mouth daily.   Yes [provider]  pantoprazole (PROTONIX) 40 MG tablet Take 40 mg by mouth 2 (two) times daily.   Yes [provider]  potassium chloride SA (KLOR-CON) 20 MEQ tablet Take 20 mEq by mouth daily.   Yes [provider]  pravastatin (PRAVACHOL) 20 MG tablet Take 20 mg by mouth every evening.   Yes [provider]  rivaroxaban (XARELTO) 20 MG TABS tablet Take 20 mg by mouth every evening.   Yes [provider]  tamsulosin (FLOMAX) 0.4 MG CAPS capsule Take 0.4 mg by mouth daily.   Yes [provider]  pantoprazole (PROTONIX) 40 MG tablet Take 1 tablet (40 mg total) by mouth 2 (two) times daily. Patient not taking: Reported on 10/17/2020 11/29/19 03/28/20  Earlene Plater, MD  atorvastatin (LIPITOR) 40 MG tablet Take 40 mg by mouth at bedtime.    01/27/12  [provider]  furosemide (LASIX) 20 MG tablet Take 1 tablet (20 mg total) by mouth daily. 07/30/11 01/27/12  Ezra Sites, MD    Allergies    Aspirin, Cephalexin, Latex, and Penicillins  Review of Systems   Review of Systems  Unable to perform ROS: Dementia  Constitutional: Positive for activity change.  Respiratory: Negative for cough and shortness of breath.   Cardiovascular: Negative for chest pain.  Gastrointestinal: Negative for abdominal pain.  Genitourinary: Negative for dysuria.  Neurological: Negative for headaches.    Physical Exam Updated Vital Signs BP (!) 145/76   Pulse 82   Temp (!) 97.4 F (36.3 C) (Oral)   Resp (!) 22   SpO2 97%   Physical Exam Vitals and nursing note reviewed.  Constitutional:      Appearance: He is well-developed and well-nourished.  HENT:     Head: Normocephalic and atraumatic.     Comments: Right ear canal is edematous.  No tenderness over the mastoid region. Eyes:     Extraocular Movements: EOM normal.     Conjunctiva/sclera:  Conjunctivae normal.     Pupils: Pupils are equal, round, and reactive to light.  Neck:     Vascular: No JVD.  Cardiovascular:  Rate and Rhythm: Normal rate and regular rhythm.  Pulmonary:     Effort: Pulmonary effort is normal. No respiratory distress.     Breath sounds: Normal breath sounds. No wheezing.  Abdominal:     General: Bowel sounds are normal. There is no distension.     Palpations: Abdomen is soft. There is no mass.     Tenderness: There is no abdominal tenderness. There is no guarding or rebound.  Musculoskeletal:        General: No deformity.     Cervical back: Normal range of motion and neck supple.  Skin:    General: Skin is warm and dry.  Neurological:     Mental Status: He is alert and oriented to person, place, and time.     Cranial Nerves: No cranial nerve deficit.     Coordination: Coordination normal.     Comments: NIHSS - 0 No objective sensory deficits, Motor strength upper and lower extremity 4+ and equal Normal cerebellar exam     ED Results / Procedures / Treatments   Labs (all labs ordered are listed, but only abnormal results are displayed) Labs Reviewed  COMPREHENSIVE METABOLIC PANEL - Abnormal; Notable for the following components:      Result Value   Sodium 134 (*)    Chloride 96 (*)    Glucose, Bld 125 (*)    Total Protein 6.0 (*)    Albumin 3.0 (*)    Alkaline Phosphatase 31 (*)    All other components within normal limits  CBC WITH DIFFERENTIAL/PLATELET - Abnormal; Notable for the following components:   Neutro Abs 8.2 (*)    Lymphs Abs 0.6 (*)    Monocytes Absolute 1.1 (*)    All other components within normal limits  URINALYSIS, COMPLETE (UACMP) WITH MICROSCOPIC - Abnormal; Notable for the following components:   Ketones, ur 20 (*)    All other components within normal limits  PROTIME-INR - Abnormal; Notable for the following components:   Prothrombin Time 15.7 (*)    INR 1.3 (*)    All other components within normal limits   APTT - Abnormal; Notable for the following components:   aPTT 40 (*)    All other components within normal limits  VALPROIC ACID LEVEL - Abnormal; Notable for the following components:   Valproic Acid Lvl 24 (*)    All other components within normal limits  COMPREHENSIVE METABOLIC PANEL - Abnormal; Notable for the following components:   Sodium 133 (*)    Chloride 94 (*)    Glucose, Bld 114 (*)    Total Protein 5.4 (*)    Albumin 2.9 (*)    Alkaline Phosphatase 32 (*)    Total Bilirubin 1.6 (*)    All other components within normal limits  CBC - Abnormal; Notable for the following components:   WBC 17.9 (*)    All other components within normal limits  CBG MONITORING, ED - Abnormal; Notable for the following components:   Glucose-Capillary 110 (*)    All other components within normal limits  RESP PANEL BY RT-PCR (FLU A&B, COVID) ARPGX2  URINE CULTURE  CULTURE, BLOOD (ROUTINE X 2)  CULTURE, BLOOD (ROUTINE X 2)  RESPIRATORY PANEL BY PCR  TSH  MAGNESIUM  RAPID URINE DRUG SCREEN, HOSP PERFORMED    EKG None  Radiology CT Head Wo Contrast  Result Date: 10/17/2020 CLINICAL DATA:  Mental status change, unknown cause also, R sided otitis exterma - eval for signs of osteo/mastoiditis. EXAM: CT  HEAD WITHOUT CONTRAST TECHNIQUE: Contiguous axial images were obtained from the base of the skull through the vertex without intravenous contrast. COMPARISON:  Report from prior exams at an outside institution, no images available. FINDINGS: Brain: Age related atrophy. No ventriculomegaly is likely due to central atrophy. Advanced periventricular and deep white matter hypodensity, typical of chronic small vessel ischemia. Remote lacunar infarct in the right basal ganglia. No hemorrhage or evidence of acute infarct. No subdural or extra-axial collection. Vascular: Atherosclerosis of skullbase vasculature without hyperdense vessel or abnormal calcification. Skull: No fracture or focal lesion.  Sinuses/Orbits: Minimal soft tissue density in the right external auditory canal. No evidence of subjacent bony destruction or abnormal mastoid air cell opacification. Mastoid air cells are clear. Left mastoid air cells are clear. Mucosal thickening of the anterior right maxillary sinus, scattered ethmoid air cells. Bilateral cataract resection. Other: Soft tissue thickening of the right external auditory canal and pinna. No focal fluid collection. IMPRESSION: 1. Soft tissue thickening of the right external auditory canal and pinna consistent with otitis externa. No evidence of subjacent bony destruction or abnormal mastoid air cell opacification. 2. Age related atrophy and advanced chronic small vessel ischemia. Remote lacunar infarct in the right basal ganglia. No acute intracranial abnormality. Electronically Signed   By: Keith Rake M.D.   On: 10/17/2020 16:21   MR BRAIN WO CONTRAST  Result Date: 10/17/2020 CLINICAL DATA:  Acute neurologic deficit EXAM: MRI HEAD WITHOUT CONTRAST TECHNIQUE: Multiplanar, multiecho pulse sequences of the brain and surrounding structures were obtained without intravenous contrast. COMPARISON:  None. FINDINGS: Brain: No acute infarct, mass effect or extra-axial collection. No acute or chronic hemorrhage. Confluent hyperintense T2-weighted white matter signal. Diffuse, severe atrophy. The midline structures are normal. Vascular: Major flow voids are preserved. Skull and upper cervical spine: Normal calvarium and skull base. Visualized upper cervical spine and soft tissues are normal. Sinuses/Orbits:No paranasal sinus fluid levels or advanced mucosal thickening. No mastoid or middle ear effusion. Normal orbits. IMPRESSION: 1. No acute intracranial abnormality. 2. Diffuse, severe atrophy and findings of chronic small vessel disease. Electronically Signed   By: Ulyses Jarred M.D.   On: 10/17/2020 21:42   DG Chest Port 1 View  Result Date: 10/17/2020 CLINICAL DATA:  Altered  mental status EXAM: PORTABLE CHEST 1 VIEW COMPARISON:  10/24/2019 FINDINGS: Lungs volumes are extremely small, but are symmetric and are clear. No pneumothorax or pleural effusion. Cardiac size within normal limits. Pulmonary vascularity is normal. Osseous structures are age-appropriate. No acute bone abnormality. IMPRESSION: Pulmonary hypoinflation. Electronically Signed   By: Fidela Salisbury MD   On: 10/17/2020 19:11    Procedures Procedures (including critical care time)  Medications Ordered in ED Medications  pravastatin (PRAVACHOL) tablet 20 mg (has no administration in time range)  pantoprazole (PROTONIX) EC tablet 40 mg (has no administration in time range)  docusate sodium (COLACE) capsule 100 mg (has no administration in time range)  tamsulosin (FLOMAX) capsule 0.4 mg (has no administration in time range)  mirabegron ER (MYRBETRIQ) tablet 25 mg (has no administration in time range)  rivaroxaban (XARELTO) tablet 20 mg (has no administration in time range)  neomycin-polymyxin-hydrocortisone (CORTISPORIN) OTIC (EAR) suspension 4 drop (0 drops Right EAR Hold 10/18/20 0521)  sodium chloride flush (NS) 0.9 % injection 3 mL (3 mLs Intravenous Given 10/17/20 2307)  0.9 %  sodium chloride infusion ( Intravenous Rate/Dose Verify 10/18/20 0729)  acetaminophen (TYLENOL) tablet 650 mg (has no administration in time range)    Or  acetaminophen (TYLENOL)  suppository 650 mg (has no administration in time range)  polyethylene glycol (MIRALAX / GLYCOLAX) packet 17 g (has no administration in time range)  ipratropium-albuterol (DUONEB) 0.5-2.5 (3) MG/3ML nebulizer solution 3 mL (3 mLs Nebulization Not Given 10/18/20 0204)  albuterol (PROVENTIL) (2.5 MG/3ML) 0.083% nebulizer solution 2.5 mg (has no administration in time range)  lactated ringers bolus 500 mL (0 mLs Intravenous Stopped 10/17/20 1703)  LORazepam (ATIVAN) injection 1 mg ( Intravenous Canceled Entry 10/17/20 2000)  clindamycin (CLEOCIN)  IVPB 600 mg (0 mg Intravenous Stopped 10/17/20 2302)    ED Course  I have reviewed the triage vital signs and the nursing notes.  Pertinent labs & imaging results that were available during my care of the patient were reviewed by me and considered in my medical decision making (see chart for details).    MDM Rules/Calculators/A&P                          86 year old male comes in a chief complaint of altered mental status and stroke concerns.  At the nursing home patient had what appears to be focal right-sided weakness, patient was unable to feed himself.  That is what prompted EMS call.  Prior to that it appears that patient was already more sluggish than usual, and not as energetic or active.  Patient has no complaints from his side.  Have a nonfocal neurologic exam and patient is hemodynamically stable with no SIRS criteria at arrival.  Differential diagnosis includes TIA, metabolic derangement, renal failure, mild stroke, UTI.   Unlikely to be encephalitis/meningitis or extension of otitis externa but when we get the CT scan of the head we will ask the radiologist to comment on the right temporal region.  Patient will likely need admission for TIA work-up.  CT scan results are reassuring.  Patient's care has been signed out to Dr. Vanita Panda, all the lab results are pending at this time.  Final Clinical Impression(s) / ED Diagnoses Final diagnoses:  Somnolence  TIA (transient ischemic attack)    Rx / DC Orders ED Discharge Orders    None       Varney Biles, MD 10/18/20 [phone]

## 2020-10-18 NOTE — Progress Notes (Signed)
Detail notes to follow   Discussed with patient's son. We discussed that he may qualify for hospice . He lives in [place] and was seen by hospice off [facility] in the past. Family interested to see whether he can go to NH with hospice or to hospice home . Stable to transfer if plan of care for hospice in place.

## 2020-10-18 NOTE — ED Notes (Signed)
[facility] here to transport pt to [facility]

## 2020-10-18 NOTE — Progress Notes (Signed)
   Spoke to the pt's [name] and [name] who was at bedside with pt. Explained hospice comfort care both are in agreement with this. I have spoke to my MD who has approved him for hospice care as well. Family was hopeful that he could go to the Hospice home in [facility]. However we do not have an available spot at this time. Therefore they are in agreement with him going back to [facility] with hospice care and we will follow up once he has returned.   Thank you for your help with this referral. Webb Silversmith RN BSN [facility]  [phone]

## 2020-10-18 NOTE — ED Notes (Signed)
Reviewed discharge instructions with patient's wife and staff at facility. Medications reviewed. Patient's wife and staff at facility verbalized understanding. Patient stable upon discharge and VSS. Pt transported back to facility via [facility]

## 2020-10-23 LAB — CULTURE, BLOOD (ROUTINE X 2)
Culture: NO GROWTH
Special Requests: ADEQUATE

## 2020-11-03 DEATH — deceased

## 2021-05-04 IMAGING — DX DG CHEST 1V PORT
1 series · 1 of 1 positions shown · non-contrast
Comparison: 03/02/2018.

CLINICAL DATA: Shortness of breath. [id number] positive.

EXAM:
PORTABLE CHEST 1 VIEW

[chest]
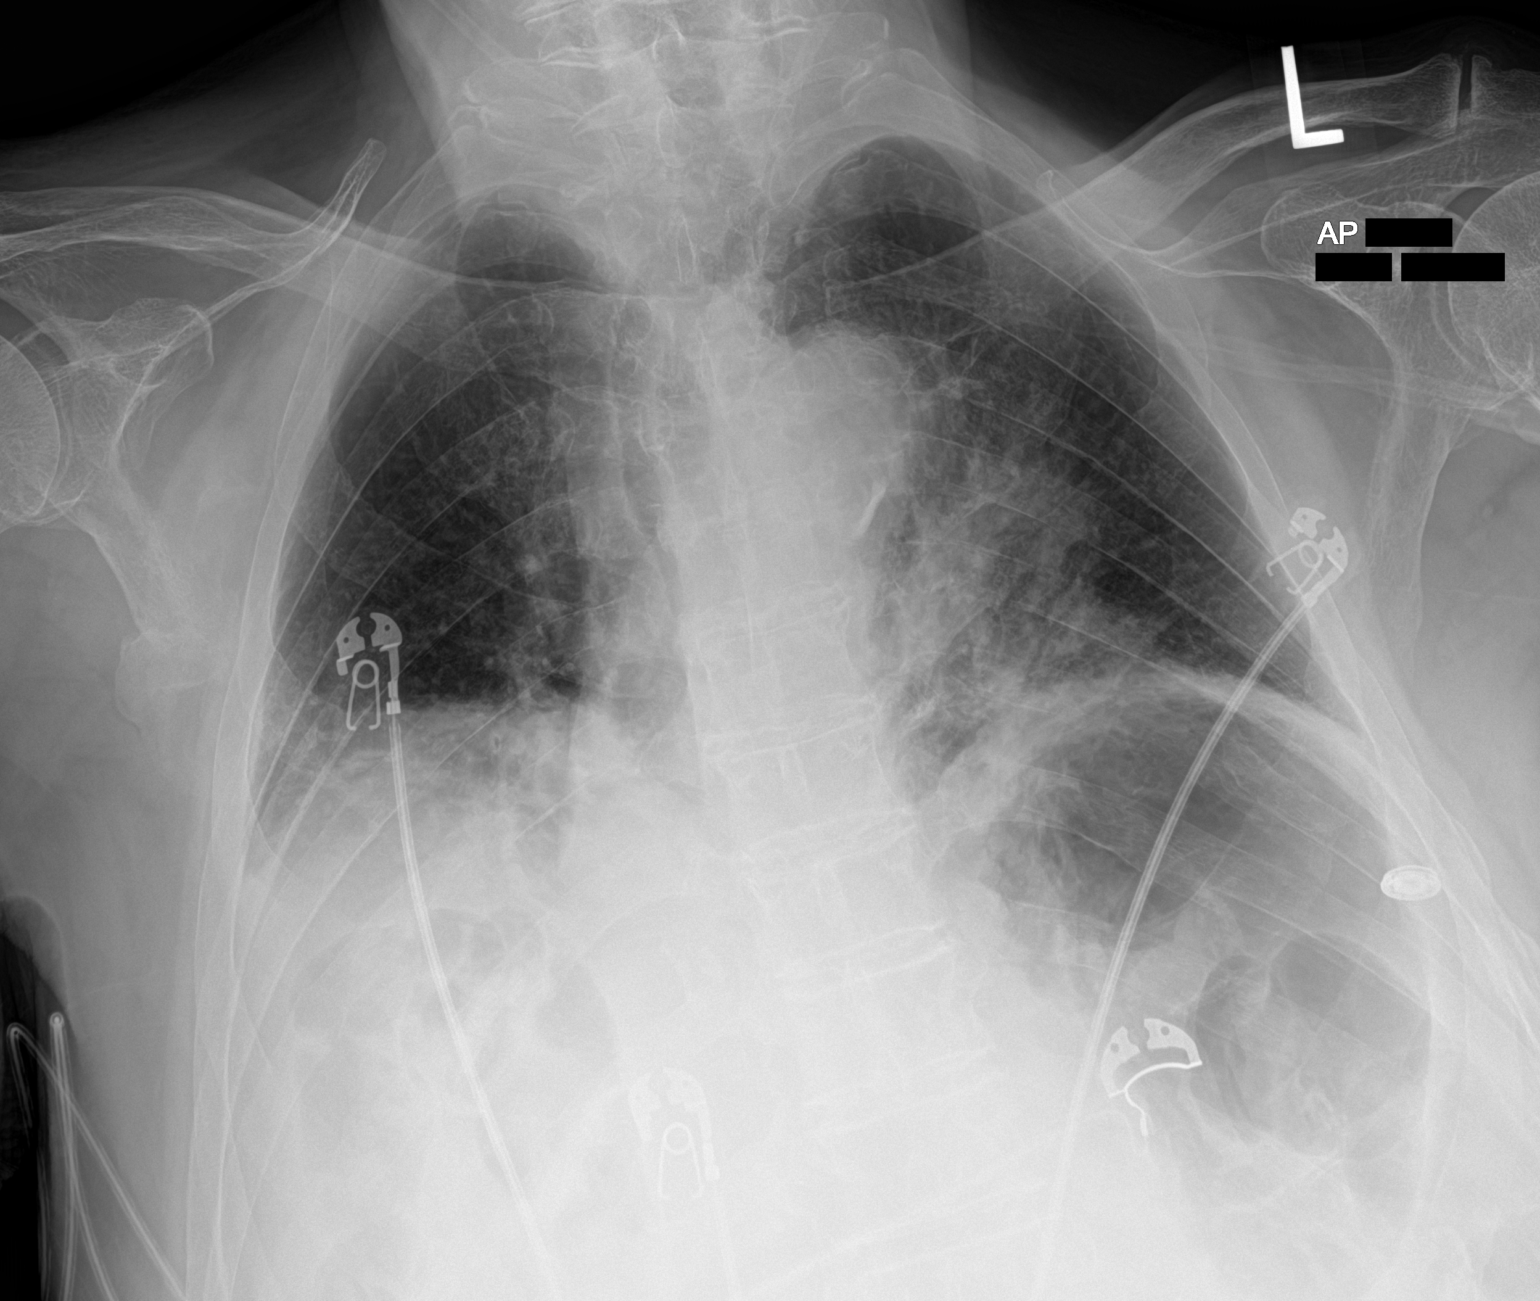

[1 of 1 positions shown; findings below may reference images not displayed]

FINDINGS: Interval patchy opacity in the medial aspects of the lower half of
the left lung with obscuration of the left heart border. No
significant change in a small amount of linear density at the right
lung base, possibly representing scarring or atelectasis. Poor
inspiration. Old, healed inferior right scapula fracture.
IMPRESSION: 1. Interval patchy opacity in the medial aspects of the lower half
of the left lung compatible with pneumonia.
2. Stable small amount of linear scarring or atelectasis at the
right lung base.
